# Patient Record
Sex: Female | Born: 1960 | Race: Black or African American | Hispanic: No | Marital: Single | Smoking: Never smoker
Health system: Southern US, Community
[De-identification: ages and names within clinical notes are randomized; demographics above are authoritative.]

## PROBLEM LIST (undated history)

## (undated) DIAGNOSIS — Z8041 Family history of malignant neoplasm of ovary: Secondary | ICD-10-CM

## (undated) DIAGNOSIS — Z9221 Personal history of antineoplastic chemotherapy: Secondary | ICD-10-CM

## (undated) DIAGNOSIS — Z853 Personal history of malignant neoplasm of breast: Secondary | ICD-10-CM

## (undated) DIAGNOSIS — Z1211 Encounter for screening for malignant neoplasm of colon: Secondary | ICD-10-CM

## (undated) DIAGNOSIS — K869 Disease of pancreas, unspecified: Secondary | ICD-10-CM

## (undated) DIAGNOSIS — H04123 Dry eye syndrome of bilateral lacrimal glands: Secondary | ICD-10-CM

## (undated) DIAGNOSIS — C50419 Malignant neoplasm of upper-outer quadrant of unspecified female breast: Secondary | ICD-10-CM

## (undated) DIAGNOSIS — C50919 Malignant neoplasm of unspecified site of unspecified female breast: Secondary | ICD-10-CM

## (undated) DIAGNOSIS — Z923 Personal history of irradiation: Secondary | ICD-10-CM

## (undated) DIAGNOSIS — Z1371 Encounter for nonprocreative screening for genetic disease carrier status: Secondary | ICD-10-CM

## (undated) DIAGNOSIS — I10 Essential (primary) hypertension: Secondary | ICD-10-CM

## (undated) DIAGNOSIS — T7840XA Allergy, unspecified, initial encounter: Secondary | ICD-10-CM

## (undated) DIAGNOSIS — D649 Anemia, unspecified: Secondary | ICD-10-CM

## (undated) DIAGNOSIS — G629 Polyneuropathy, unspecified: Secondary | ICD-10-CM

## (undated) DIAGNOSIS — Z1389 Encounter for screening for other disorder: Secondary | ICD-10-CM

## (undated) DIAGNOSIS — J45909 Unspecified asthma, uncomplicated: Secondary | ICD-10-CM

## (undated) DIAGNOSIS — N200 Calculus of kidney: Secondary | ICD-10-CM

## (undated) DIAGNOSIS — E669 Obesity, unspecified: Secondary | ICD-10-CM

## (undated) DIAGNOSIS — Z8042 Family history of malignant neoplasm of prostate: Secondary | ICD-10-CM

## (undated) DIAGNOSIS — N6009 Solitary cyst of unspecified breast: Secondary | ICD-10-CM

## (undated) DIAGNOSIS — L309 Dermatitis, unspecified: Secondary | ICD-10-CM

## (undated) DIAGNOSIS — J302 Other seasonal allergic rhinitis: Secondary | ICD-10-CM

## (undated) DIAGNOSIS — Z803 Family history of malignant neoplasm of breast: Secondary | ICD-10-CM

## (undated) DIAGNOSIS — N63 Unspecified lump in unspecified breast: Secondary | ICD-10-CM

## (undated) DIAGNOSIS — K5900 Constipation, unspecified: Secondary | ICD-10-CM

## (undated) DIAGNOSIS — R319 Hematuria, unspecified: Secondary | ICD-10-CM

## (undated) DIAGNOSIS — Z1239 Encounter for other screening for malignant neoplasm of breast: Secondary | ICD-10-CM

## (undated) DIAGNOSIS — K219 Gastro-esophageal reflux disease without esophagitis: Secondary | ICD-10-CM

## (undated) HISTORY — DX: Polyneuropathy, unspecified: G62.9

## (undated) HISTORY — DX: Unspecified lump in unspecified breast: N63.0

## (undated) HISTORY — DX: Calculus of kidney: N20.0

## (undated) HISTORY — DX: Malignant neoplasm of upper-outer quadrant of unspecified female breast: C50.419

## (undated) HISTORY — DX: Malignant neoplasm of unspecified site of unspecified female breast: C50.919

## (undated) HISTORY — DX: Encounter for screening for malignant neoplasm of colon: Z12.11

## (undated) HISTORY — DX: Constipation, unspecified: K59.00

## (undated) HISTORY — DX: Allergy, unspecified, initial encounter: T78.40XA

## (undated) HISTORY — DX: Solitary cyst of unspecified breast: N60.09

## (undated) HISTORY — DX: Family history of malignant neoplasm of breast: Z80.3

## (undated) HISTORY — DX: Family history of malignant neoplasm of ovary: Z80.41

## (undated) HISTORY — DX: Dermatitis, unspecified: L30.9

## (undated) HISTORY — DX: Encounter for other screening for malignant neoplasm of breast: Z12.39

## (undated) HISTORY — DX: Hematuria, unspecified: R31.9

## (undated) HISTORY — DX: Encounter for screening for other disorder: Z13.89

## (undated) HISTORY — PX: BREAST BIOPSY: SHX20

## (undated) HISTORY — DX: Family history of malignant neoplasm of prostate: Z80.42

## (undated) HISTORY — DX: Other seasonal allergic rhinitis: J30.2

## (undated) HISTORY — DX: Unspecified asthma, uncomplicated: J45.909

## (undated) HISTORY — DX: Disease of pancreas, unspecified: K86.9

## (undated) HISTORY — DX: Essential (primary) hypertension: I10

## (undated) HISTORY — DX: Encounter for nonprocreative screening for genetic disease carrier status: Z13.71

## (undated) HISTORY — DX: Dry eye syndrome of bilateral lacrimal glands: H04.123

## (undated) HISTORY — DX: Obesity, unspecified: E66.9

## (undated) HISTORY — DX: Personal history of malignant neoplasm of breast: Z85.3

## (undated) HISTORY — PX: TUBAL LIGATION: SHX77

---

## 1998-07-25 ENCOUNTER — Other Ambulatory Visit: Admission: RE | Admit: 1998-07-25 | Discharge: 1998-07-25 | Payer: Self-pay | Admitting: Obstetrics and Gynecology

## 1998-08-15 ENCOUNTER — Encounter: Payer: Self-pay | Admitting: Obstetrics and Gynecology

## 1998-08-15 ENCOUNTER — Ambulatory Visit (HOSPITAL_COMMUNITY): Admission: RE | Admit: 1998-08-15 | Discharge: 1998-08-15 | Payer: Self-pay | Admitting: Obstetrics and Gynecology

## 1998-08-27 ENCOUNTER — Ambulatory Visit (HOSPITAL_COMMUNITY): Admission: RE | Admit: 1998-08-27 | Discharge: 1998-08-27 | Payer: Self-pay | Admitting: Obstetrics and Gynecology

## 1998-08-27 ENCOUNTER — Encounter: Payer: Self-pay | Admitting: Obstetrics and Gynecology

## 1999-12-25 ENCOUNTER — Other Ambulatory Visit: Admission: RE | Admit: 1999-12-25 | Discharge: 1999-12-25 | Payer: Self-pay | Admitting: Obstetrics and Gynecology

## 2001-03-10 HISTORY — PX: KIDNEY STONE SURGERY: SHX686

## 2008-03-10 HISTORY — PX: BREAST CYST ASPIRATION: SHX578

## 2009-09-14 ENCOUNTER — Ambulatory Visit: Payer: Self-pay

## 2009-10-17 ENCOUNTER — Ambulatory Visit: Payer: Self-pay | Admitting: Gastroenterology

## 2009-10-17 HISTORY — PX: COLONOSCOPY: SHX174

## 2010-03-10 DIAGNOSIS — Z923 Personal history of irradiation: Secondary | ICD-10-CM

## 2010-03-10 DIAGNOSIS — C50919 Malignant neoplasm of unspecified site of unspecified female breast: Secondary | ICD-10-CM

## 2010-03-10 DIAGNOSIS — Z853 Personal history of malignant neoplasm of breast: Secondary | ICD-10-CM

## 2010-03-10 DIAGNOSIS — H04123 Dry eye syndrome of bilateral lacrimal glands: Secondary | ICD-10-CM

## 2010-03-10 HISTORY — PX: BREAST LUMPECTOMY: SHX2

## 2010-03-10 HISTORY — DX: Dry eye syndrome of bilateral lacrimal glands: H04.123

## 2010-03-10 HISTORY — PX: BREAST SURGERY: SHX581

## 2010-03-10 HISTORY — DX: Personal history of irradiation: Z92.3

## 2010-03-10 HISTORY — DX: Personal history of malignant neoplasm of breast: Z85.3

## 2010-03-10 HISTORY — DX: Malignant neoplasm of unspecified site of unspecified female breast: C50.919

## 2010-04-02 ENCOUNTER — Ambulatory Visit: Payer: Self-pay | Admitting: Family Medicine

## 2010-04-09 ENCOUNTER — Ambulatory Visit: Payer: Self-pay | Admitting: Family Medicine

## 2010-04-23 ENCOUNTER — Ambulatory Visit: Payer: Self-pay | Admitting: General Surgery

## 2010-05-08 ENCOUNTER — Ambulatory Visit: Payer: Self-pay | Admitting: General Surgery

## 2010-05-08 DIAGNOSIS — C50419 Malignant neoplasm of upper-outer quadrant of unspecified female breast: Secondary | ICD-10-CM

## 2010-05-08 HISTORY — DX: Malignant neoplasm of upper-outer quadrant of unspecified female breast: C50.419

## 2010-05-14 ENCOUNTER — Ambulatory Visit: Payer: Self-pay | Admitting: Radiation Oncology

## 2010-05-14 LAB — PATHOLOGY REPORT

## 2010-05-17 ENCOUNTER — Ambulatory Visit: Payer: Self-pay | Admitting: Radiation Oncology

## 2010-06-09 ENCOUNTER — Ambulatory Visit: Payer: Self-pay | Admitting: Radiation Oncology

## 2010-06-09 HISTORY — PX: BREAST MAMMOSITE: SHX5264

## 2010-07-01 ENCOUNTER — Ambulatory Visit: Payer: Self-pay | Admitting: Otolaryngology

## 2010-07-09 ENCOUNTER — Ambulatory Visit: Payer: Self-pay | Admitting: Radiation Oncology

## 2010-08-09 ENCOUNTER — Ambulatory Visit: Payer: Self-pay | Admitting: Radiation Oncology

## 2010-11-13 ENCOUNTER — Ambulatory Visit: Payer: Self-pay | Admitting: General Surgery

## 2010-12-11 ENCOUNTER — Ambulatory Visit: Payer: Self-pay | Admitting: Internal Medicine

## 2011-01-09 ENCOUNTER — Ambulatory Visit: Payer: Self-pay | Admitting: Internal Medicine

## 2011-06-10 ENCOUNTER — Ambulatory Visit: Payer: Self-pay | Admitting: General Surgery

## 2011-06-17 ENCOUNTER — Ambulatory Visit: Payer: Self-pay | Admitting: General Surgery

## 2011-07-16 ENCOUNTER — Emergency Department: Payer: Self-pay

## 2011-07-16 LAB — BASIC METABOLIC PANEL
Calcium, Total: 9 mg/dL (ref 8.5–10.1)
Chloride: 106 mmol/L (ref 98–107)
Creatinine: 0.95 mg/dL (ref 0.60–1.30)
EGFR (Non-African Amer.): >60
Sodium: 140 mmol/L (ref 136–145)

## 2011-07-16 LAB — CBC
MCH: 31.4 pg (ref 26.0–34.0)
MCHC: 33.6 g/dL (ref 32.0–36.0)
Platelet: 218 10*3/uL (ref 150–440)
RBC: 3.75 10*6/uL — ABNORMAL LOW (ref 3.80–5.20)
RDW: 14.2 % (ref 11.5–14.5)
WBC: 7 10*3/uL (ref 3.6–11.0)

## 2011-12-18 ENCOUNTER — Ambulatory Visit: Payer: Self-pay | Admitting: General Surgery

## 2011-12-29 ENCOUNTER — Ambulatory Visit: Payer: Self-pay | Admitting: Internal Medicine

## 2012-01-09 ENCOUNTER — Ambulatory Visit: Payer: Self-pay | Admitting: Internal Medicine

## 2012-05-08 ENCOUNTER — Encounter: Payer: Self-pay | Admitting: General Surgery

## 2012-06-21 ENCOUNTER — Ambulatory Visit: Payer: Self-pay | Admitting: General Surgery

## 2012-06-22 ENCOUNTER — Encounter: Payer: Self-pay | Admitting: General Surgery

## 2012-06-28 ENCOUNTER — Encounter: Payer: Self-pay | Admitting: *Deleted

## 2012-06-30 ENCOUNTER — Other Ambulatory Visit: Payer: Self-pay | Admitting: *Deleted

## 2012-06-30 ENCOUNTER — Ambulatory Visit (INDEPENDENT_AMBULATORY_CARE_PROVIDER_SITE_OTHER): Payer: 59 | Admitting: General Surgery

## 2012-06-30 ENCOUNTER — Encounter: Payer: Self-pay | Admitting: General Surgery

## 2012-06-30 VITALS — BP 124/76 | Ht 65.0 in | Wt 209.0 lb

## 2012-06-30 DIAGNOSIS — C50912 Malignant neoplasm of unspecified site of left female breast: Secondary | ICD-10-CM

## 2012-06-30 DIAGNOSIS — C50919 Malignant neoplasm of unspecified site of unspecified female breast: Secondary | ICD-10-CM

## 2012-06-30 DIAGNOSIS — Z853 Personal history of malignant neoplasm of breast: Secondary | ICD-10-CM

## 2012-06-30 NOTE — Progress Notes (Signed)
Patient ID: Adrienne [name], female   DOB: 08-26-1960, 52 y.o.   MRN: [id number]  Chief Complaint  Patient presents with  . Follow-up    mammogram  . Breast Cancer Long Term Follow Up    HPI Adrienne [name] is a 52 y.o. female who presents for a follow up mammogram. The most recent mammogram was done on 06/21/12 at [facility] with birad category 2. The patient denies any new problems with her breasts. She performs regular self breast checks and gets regular mammograms done. No complaints at this time.  HPI  Past Medical History  Diagnosis Date  . Seasonal allergies   . Unspecified essential hypertension   . Asthma   . 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 Dry eyes 2012  . Breast screening, unspecified   . Lump or mass in breast   . Special screening for malignant neoplasms, colon   . Obesity, unspecified   . Screening for obesity   . Breast cyst 2010    cyst- drained  . Constipation   . Malignant neoplasm of breast (female), unspecified site   . Malignant neoplasm of upper-outer quadrant of female breast     left breast cancer diagnosed in 2012     Past Surgical History  Procedure Laterality Date  . Cesarean section  1988  . Colonoscopy  2011    Dr. Niel Hummer  . Breast mammosite  April 2012    mammosite placement and removal   . Breast surgery Left 2012    left breast wide excision  . Kidney stone surgery  2003    stones removed by Dr. Reuel Boom- stent placed/removed  . Breast cyst aspiration  2010    Family History  Problem Relation Age of Onset  . Ovarian cancer Mother     Social History History  Substance Use Topics  . Smoking status: Never Smoker   . Smokeless tobacco: Not on file  . Alcohol Use: Yes     Comment: occasionally    Allergies  Allergen Reactions  . Shellfish  Allergy Other (See Comments)    Hives and swelling on ears and feet  . Tape Rash    Current Outpatient Prescriptions  Medication Sig Dispense Refill  . ALBUTEROL SULFATE HFA IN Inhale 2 puffs into the lungs as needed.      Marland Kitchen aspirin 81 MG tablet Take 81 mg by mouth daily.      . Cholecalciferol (VITAMIN D3) 1000 UNITS CAPS Take by mouth daily.      . fexofenadine (ALLEGRA) 180 MG tablet Take 180 mg by mouth daily.      . hydrochlorothiazide (HYDRODIURIL) 25 MG tablet Take 25 mg by mouth daily.      . Naproxen Sodium (ALEVE PO) Take by mouth.      Marland Kitchen oxiconazole (OXISTAT) 1 % lotion Apply 1 application topically as needed.      Marland Kitchen POTASSIUM PO Take 1,000 Units by mouth daily.      . tacrolimus (PROTOPIC) 0.1 % ointment Apply 1 application topically as needed.       No current facility-administered medications for this visit.    Review of Systems Review of Systems  Constitutional: Negative.   Respiratory: Negative.   Cardiovascular: Negative.     Blood pressure 124/76, height 5\' 5"  (1.651  m), weight 209 lb (94.802 kg).  Physical Exam Physical Exam  Constitutional: She appears well-developed and well-nourished.  Neck: Trachea normal. No mass and no thyromegaly present.  Cardiovascular: Normal rate, regular rhythm, normal heart sounds and normal pulses.   No murmur heard. Pulmonary/Chest: Effort normal and breath sounds normal. Right breast exhibits no inverted nipple, no mass, no nipple discharge, no skin change and no tenderness. Left breast exhibits no inverted nipple, no mass, no nipple discharge, no skin change and no tenderness. Breasts are symmetrical.  Tiny bit of thickening at the lateral edge of left breast.   Lymphadenopathy:    She has no cervical adenopathy.    She has no axillary adenopathy.   Excellent breast symmetry. Data Reviewed Bilateral mammograms completed 06/21/2012 were reviewed. Postsurgical changes identified. By red-2.  Assessment    Doing well  status post treatment of her left breast T1b, N0 carcinoma left breast.    Plan    Arrangements will be made for bilateral mammograms (diagnostic) in position exam in one year.       Earline Mayotte 07/01/2012, 7:56 PM

## 2012-06-30 NOTE — Patient Instructions (Addendum)
Patient to return in 1 year with bilateral diagnostic mammogram.  

## 2012-06-30 NOTE — Progress Notes (Signed)
The patient has been asked to return to the office in one year for a bilateral diagnostic mammogram. 

## 2012-07-01 ENCOUNTER — Encounter: Payer: Self-pay | Admitting: General Surgery

## 2012-12-29 ENCOUNTER — Ambulatory Visit: Payer: Self-pay | Admitting: Radiation Oncology

## 2013-01-08 ENCOUNTER — Ambulatory Visit: Payer: Self-pay | Admitting: Radiation Oncology

## 2013-04-25 ENCOUNTER — Ambulatory Visit: Payer: Self-pay | Admitting: Family Medicine

## 2013-06-22 ENCOUNTER — Ambulatory Visit: Payer: Self-pay | Admitting: General Surgery

## 2013-06-29 ENCOUNTER — Encounter: Payer: Self-pay | Admitting: General Surgery

## 2013-06-29 ENCOUNTER — Ambulatory Visit (INDEPENDENT_AMBULATORY_CARE_PROVIDER_SITE_OTHER): Payer: 59 | Admitting: General Surgery

## 2013-06-29 ENCOUNTER — Ambulatory Visit: Payer: 59 | Admitting: General Surgery

## 2013-06-29 VITALS — BP 130/76 | HR 78 | Resp 12 | Ht 65.0 in | Wt 202.0 lb

## 2013-06-29 DIAGNOSIS — Z853 Personal history of malignant neoplasm of breast: Secondary | ICD-10-CM

## 2013-06-29 NOTE — Patient Instructions (Signed)
Patient to return in one year bilateral diagnotic mammogram.  

## 2013-06-29 NOTE — Progress Notes (Signed)
Patient ID: Adrienne [name], female   DOB: 07/02/1960, 53 y.o.   MRN: [id number]  Chief Complaint  Patient presents with  . Follow-up    mammogram    HPI Adrienne [name] is a 53 y.o. female who presents for a breast evaluation. The most recent mammogram was done on 06/22/13. Patient does perform regular self breast checks and gets regular mammograms done.  No new problems at this time time.   HPI  Past Medical History  Diagnosis Date  . Seasonal allergies   . Unspecified essential hypertension   . Asthma   . 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 Dry eyes 2012  . Breast screening, unspecified   . Lump or mass in breast   . Special screening for malignant neoplasms, colon   . Obesity, unspecified   . Screening for obesity   . Breast cyst 2010    cyst- drained  . Constipation   . Malignant neoplasm of breast (female), unspecified site   . Malignant neoplasm of upper-outer quadrant of female breast     T1b, N0, M); triple negative, DCIS present.    Past Surgical History  Procedure Laterality Date  . Cesarean section  1988  . Colonoscopy  2011    Dr. Dionne Milo  . Breast mammosite  April 2012    mammosite placement and removal   . Kidney stone surgery  2003    stones removed by Dr. Quillian Quince- stent placed/removed  . Breast surgery Left 2012    left breast wide excision  . Breast cyst aspiration  2010    Family History  Problem Relation Age of Onset  . Ovarian cancer Mother     Social History History  Substance Use Topics  . Smoking status: Never Smoker   . Smokeless tobacco: Never Used  . Alcohol Use: Yes     Comment: occasionally    Allergies  Allergen Reactions  . Shellfish Allergy Other (See Comments)    Hives and swelling on ears and feet  . Tape Rash    Current  Outpatient Prescriptions  Medication Sig Dispense Refill  . ALBUTEROL SULFATE HFA IN Inhale 2 puffs into the lungs as needed.      Marland Kitchen aspirin 81 MG tablet Take 81 mg by mouth daily.      . Cholecalciferol (VITAMIN D3) 1000 UNITS CAPS Take by mouth daily.      . fexofenadine (ALLEGRA) 180 MG tablet Take 180 mg by mouth daily.      . hydrochlorothiazide (HYDRODIURIL) 25 MG tablet Take 25 mg by mouth daily.      . Naproxen Sodium (ALEVE PO) Take by mouth.      Marland Kitchen oxiconazole (OXISTAT) 1 % lotion Apply 1 application topically as needed.      Marland Kitchen POTASSIUM PO Take 1,000 Units by mouth daily.      . tacrolimus (PROTOPIC) 0.1 % ointment Apply 1 application topically as needed.       No current facility-administered medications for this visit.    Review of Systems Review of Systems  Constitutional: Negative.   Respiratory: Negative.   Cardiovascular: Negative.     Blood pressure 130/76, pulse 78, resp. rate 12, height 5\' 5"  (1.651 m), weight 202 lb (91.627 kg).  Physical Exam Physical Exam  Constitutional: She is  oriented to person, place, and time. She appears well-developed and well-nourished.  Eyes: Conjunctivae are normal.  Neck: Neck supple.  Cardiovascular: Normal rate, regular rhythm and normal heart sounds.   Pulmonary/Chest: Effort normal and breath sounds normal. Right breast exhibits no inverted nipple, no mass, no nipple discharge, no skin change and no tenderness. Left breast exhibits no inverted nipple, no mass, no nipple discharge, no skin change and no tenderness.  Thickening at the mid section of the scar 3 cm left breast.  Abdominal: Soft. Bowel sounds are normal. There is no tenderness.  Lymphadenopathy:    She has no cervical adenopathy.    She has no axillary adenopathy.  Neurological: She is alert and oriented to person, place, and time.  Skin: Skin is warm and dry.    Data Reviewed Bilateral mammograms dated June 22, 2013 were reviewed and compared to previous  studies. No interval change.BI-RAD 2.  Assessment    Stable breast exam.     Plan    Followup examination in one year with mammograms has been recommended to the patient.     PCP: Seth Bake Jaymond Waage 06/30/2013, 4:13 PM

## 2013-06-30 ENCOUNTER — Encounter: Payer: Self-pay | Admitting: General Surgery

## 2014-01-09 ENCOUNTER — Encounter: Payer: Self-pay | Admitting: General Surgery

## 2014-03-10 DIAGNOSIS — Z9221 Personal history of antineoplastic chemotherapy: Secondary | ICD-10-CM

## 2014-03-10 HISTORY — PX: BREAST LUMPECTOMY: SHX2

## 2014-03-10 HISTORY — DX: Personal history of antineoplastic chemotherapy: Z92.21

## 2014-06-07 ENCOUNTER — Ambulatory Visit
Admit: 2014-06-07 | Disposition: A | Discharge status: Home / Self Care | Payer: Self-pay | Attending: Obstetrics and Gynecology | Admitting: Obstetrics and Gynecology

## 2014-06-27 ENCOUNTER — Encounter: Payer: Self-pay | Admitting: General Surgery

## 2014-06-27 ENCOUNTER — Ambulatory Visit
Admit: 2014-06-27 | Disposition: A | Discharge status: Home / Self Care | Payer: Self-pay | Attending: General Surgery | Admitting: General Surgery

## 2014-06-29 ENCOUNTER — Ambulatory Visit
Admit: 2014-06-29 | Disposition: A | Discharge status: Home / Self Care | Payer: Self-pay | Admitting: Obstetrics and Gynecology

## 2014-07-04 ENCOUNTER — Encounter: Payer: Self-pay | Admitting: General Surgery

## 2014-07-05 ENCOUNTER — Other Ambulatory Visit: Payer: 59

## 2014-07-05 ENCOUNTER — Encounter: Payer: Self-pay | Admitting: General Surgery

## 2014-07-05 ENCOUNTER — Ambulatory Visit (INDEPENDENT_AMBULATORY_CARE_PROVIDER_SITE_OTHER): Payer: 59 | Admitting: General Surgery

## 2014-07-05 VITALS — BP 148/78 | HR 82 | Resp 13 | Ht 65.0 in | Wt 209.0 lb

## 2014-07-05 DIAGNOSIS — N6310 Unspecified lump in the right breast, unspecified quadrant: Secondary | ICD-10-CM

## 2014-07-05 DIAGNOSIS — Z853 Personal history of malignant neoplasm of breast: Secondary | ICD-10-CM | POA: Diagnosis not present

## 2014-07-05 DIAGNOSIS — R928 Other abnormal and inconclusive findings on diagnostic imaging of breast: Secondary | ICD-10-CM

## 2014-07-05 NOTE — Progress Notes (Signed)
Patient ID: Adrienne [name], female   DOB: 02/04/1961, 54 y.o.   MRN: [id number]  Chief Complaint  Patient presents with  . Follow-up    mammogram    HPI Adrienne [name] is a 54 y.o. female who presents for a breast evaluation. The most recent mammogram and right ultrasound was done on 06/27/14 .  Patient does perform regular self breast checks and gets regular mammograms done.  Patient had a ct scan done on 06/29/14 and states she has a spot on her pancreas.  HPI  Past Medical History  Diagnosis Date  . Seasonal allergies   . Unspecified essential hypertension   . Asthma   . 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 Dry eyes 2012  . Breast screening, unspecified   . Lump or mass in breast   . Special screening for malignant neoplasms, colon   . Obesity, unspecified   . Screening for obesity   . Breast cyst 2010    cyst- drained  . Constipation   . Malignant neoplasm of breast (female), unspecified site   . Malignant neoplasm of upper-outer quadrant of female breast     T1b, N0, M); triple negative, DCIS present.  . Kidney stone     Past Surgical History  Procedure Laterality Date  . Cesarean section  1988  . Colonoscopy  2011    Dr. Dionne Milo  . Breast mammosite  April 2012    mammosite placement and removal   . Kidney stone surgery  2003    stones removed by Dr. Quillian Quince- stent placed/removed  . Breast surgery Left 2012    left breast wide excision  . Breast cyst aspiration  2010    Family History  Problem Relation Age of Onset  . Ovarian cancer Mother     Social History History  Substance Use Topics  . Smoking status: Never Smoker   . Smokeless tobacco: Never Used  . Alcohol Use: Yes     Comment: occasionally    Allergies  Allergen Reactions  . Shellfish Allergy Other  (See Comments)    Hives and swelling on ears and feet  . Tape Rash    Current Outpatient Prescriptions  Medication Sig Dispense Refill  . ALBUTEROL SULFATE HFA IN Inhale 2 puffs into the lungs as needed.    Marland Kitchen aspirin 81 MG tablet Take 81 mg by mouth daily.    Marland Kitchen b complex vitamins tablet Take 1 tablet by mouth daily.    . Cholecalciferol (VITAMIN D3) 1000 UNITS CAPS Take by mouth daily.    . fexofenadine (ALLEGRA) 180 MG tablet Take 180 mg by mouth daily.    . hydrochlorothiazide (HYDRODIURIL) 25 MG tablet Take 12.5 mg by mouth daily.     Marland Kitchen POTASSIUM PO Take 1,000 Units by mouth daily.     No current facility-administered medications for this visit.    Review of Systems Review of Systems  Constitutional: Negative.   Respiratory: Negative.   Cardiovascular: Negative.     Blood pressure 148/78, pulse 82, resp. rate 13, height 5\' 5"  (1.651 m), weight 209 lb (94.802 kg).  Physical Exam Physical Exam  Constitutional: She is oriented to person, place, and time. She appears well-developed and well-nourished.  Eyes: Conjunctivae are normal. No scleral icterus.  Cardiovascular: Normal rate, regular rhythm  and normal heart sounds.   Pulmonary/Chest: Effort normal and breath sounds normal. Right breast exhibits no inverted nipple, no mass, no nipple discharge, no skin change and no tenderness. Left breast exhibits no inverted nipple, no mass, no nipple discharge, no skin change and no tenderness.  Well healed incision in the upper outer quadrant and 4 cm thickening below the scar left breast.  Lymphadenopathy:    She has no cervical adenopathy.    She has no axillary adenopathy.  Neurological: She is alert and oriented to person, place, and time.  Skin: Skin is warm and dry.    Data Reviewed Bilateral diagnostic mammograms and right breast ultrasound dated 06/27/2014 was reviewed. The left breast was unremarkable. The right breast just an 8 mm mass in the upper-outer quadrant.  Ultrasound reported a 6 x 5 x 6 mm area 9 cm from nipple 11:00 position. I read-4.  Ultrasound examination of the right breast was completed in both the supine and left lateral decubitus position. A corresponding abnormality could not be appreciated. (No images, no charge).  Assessment    Possible abnormal right breast mammogram/ultrasound.    Plan    The inability to recognize the area described by the radiologist was reviewed with the patient. It's reasonable to have attempted biopsy completed by the radiology service. The patient is aware that the pathology report will be available likely 2 days post biopsy.  Patient to have a right breast ultrasound guided biopsy at the [facility] on Friday, 07-07-14 at 8:15 am.       PCP:  Kennieth Francois 07/05/2014, 8:22 PM

## 2014-07-05 NOTE — Patient Instructions (Addendum)
Follow up appointment to be announced.  Patient to have a right breast ultrasound guided biopsy at the [facility] on Friday, 07-07-14 at 8:15 am.

## 2014-07-07 ENCOUNTER — Other Ambulatory Visit: Payer: Self-pay

## 2014-07-07 ENCOUNTER — Ambulatory Visit
Admit: 2014-07-07 | Disposition: A | Discharge status: Home / Self Care | Payer: Self-pay | Attending: General Surgery | Admitting: General Surgery

## 2014-07-08 DIAGNOSIS — C50919 Malignant neoplasm of unspecified site of unspecified female breast: Secondary | ICD-10-CM

## 2014-07-08 HISTORY — DX: Malignant neoplasm of unspecified site of unspecified female breast: C50.919

## 2014-07-11 ENCOUNTER — Telehealth: Payer: Self-pay | Admitting: General Surgery

## 2014-07-11 LAB — SURGICAL PATHOLOGY

## 2014-07-11 NOTE — Telephone Encounter (Signed)
Patient notified results of recently complete right breast biopsy showed invasive cancer. We will get together on Thursday AM at 8: 15 to review options.

## 2014-07-13 ENCOUNTER — Other Ambulatory Visit: Payer: Self-pay | Admitting: General Surgery

## 2014-07-13 ENCOUNTER — Encounter: Payer: Self-pay | Admitting: General Surgery

## 2014-07-13 ENCOUNTER — Ambulatory Visit: Payer: 59

## 2014-07-13 ENCOUNTER — Ambulatory Visit (INDEPENDENT_AMBULATORY_CARE_PROVIDER_SITE_OTHER): Payer: 59 | Admitting: General Surgery

## 2014-07-13 VITALS — BP 138/90 | HR 60 | Resp 12 | Ht 65.0 in | Wt 204.0 lb

## 2014-07-13 DIAGNOSIS — C50911 Malignant neoplasm of unspecified site of right female breast: Secondary | ICD-10-CM

## 2014-07-13 NOTE — Progress Notes (Signed)
Patient ID: Adrienne [name], female   DOB: Nov 21, 1960, 54 y.o.   MRN: [id number]  Chief Complaint  Patient presents with  . Follow-up    HPI Adrienne [name] is a 54 y.o. female.  Here today for breast discussion.  HPI  Past Medical History  Diagnosis Date  . Seasonal allergies   . Unspecified essential hypertension   . Asthma   . 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 Dry eyes 2012  . Breast screening, unspecified   . Lump or mass in breast   . Special screening for malignant neoplasms, colon   . Obesity, unspecified   . Screening for obesity   . Breast cyst 2010    cyst- drained  . Constipation   . Kidney stone   . Malignant neoplasm of breast (female), unspecified site 07/08/2014    Right breast, triple negative  . Malignant neoplasm of upper-outer quadrant of female breast 05/08/2010    Left breast: T1b, N0, M); triple negative, DCIS present.    Past Surgical History  Procedure Laterality Date  . Cesarean section  1988  . Colonoscopy  2011    Dr. Dionne Milo  . Breast mammosite  April 2012    mammosite placement and removal   . Kidney stone surgery  2003    stones removed by Dr. Quillian Quince- stent placed/removed  . Breast surgery Left 2012    left breast wide excision  . Breast cyst aspiration  2010    Family History  Problem Relation Age of Onset  . Ovarian cancer Mother     Social History History  Substance Use Topics  . Smoking status: Never Smoker   . Smokeless tobacco: Never Used  . Alcohol Use: Yes     Comment: occasionally    Allergies  Allergen Reactions  . Shellfish Allergy Other (See Comments)    Hives and swelling on ears and feet  . Tape Rash    Current Outpatient Prescriptions  Medication Sig Dispense Refill  . ALBUTEROL SULFATE HFA IN Inhale 2 puffs  into the lungs as needed.    Marland Kitchen aspirin 81 MG tablet Take 81 mg by mouth daily.    Marland Kitchen b complex vitamins tablet Take 1 tablet by mouth daily.    . Cholecalciferol (VITAMIN D3) 1000 UNITS CAPS Take by mouth daily.    . fexofenadine (ALLEGRA) 180 MG tablet Take 180 mg by mouth daily.    . hydrochlorothiazide (HYDRODIURIL) 25 MG tablet Take 12.5 mg by mouth daily.     Marland Kitchen POTASSIUM PO Take 1,000 Units by mouth daily.     No current facility-administered medications for this visit.    Review of Systems Review of Systems  Constitutional: Negative.   Respiratory: Negative.   Cardiovascular: Negative.     Blood pressure 138/90, pulse 60, resp. rate 12, height 5\' 5"  (1.651 m), weight 204 lb (92.534 kg).  Physical Exam Physical Exam  Constitutional: She is oriented to person, place, and time. She appears well-developed and well-nourished.  Pulmonary/Chest:  Right breast biopsy site with small bruise.  Neurological: She is alert and oriented to person, place, and time.  Skin: Skin is warm and dry.    Data Reviewed Ultrasound guided biopsy completed by the radiology department showed invasive mammary carcinoma, histologic grade 3, triple negative.  Ultrasound examination of the right breast in the 12:00 position 8 cm from the nipple showed the previous biopsy cavity with the marking clip in place measuring up to 0.5 cm in diameter.  Assessment    Stage I carcinoma the right breast.    Plan    Options for management were reviewed with the patient and her husband. She had previously chosen breast conservation, and considering the small tumor volume this is again her choice. The indication for sentinel node biopsy was reviewed.  Patient's surgery has been scheduled for 08-01-14 at [facility]. It is okay for patient to continue 81 mg aspirin once daily.       PCP:  Dia Sitter NP   Robert Bellow 07/14/2014, 12:56 PM

## 2014-07-13 NOTE — Patient Instructions (Addendum)
The patient is aware to call back for any questions or concerns.  Patient's surgery has been scheduled for 08-01-14 at [facility]. It is okay for patient to continue 81 mg aspirin once daily.

## 2014-07-14 ENCOUNTER — Encounter: Payer: Self-pay | Admitting: General Surgery

## 2014-07-14 ENCOUNTER — Other Ambulatory Visit: Payer: Self-pay | Admitting: General Surgery

## 2014-07-14 NOTE — H&P (Signed)
HPI  Adrienne [name] is a 54 y.o. female. Here today for breast discussion.  HPI  Past Medical History   Diagnosis  Date   .  Seasonal allergies    .  Unspecified essential hypertension    .  Asthma    .  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  Dry eyes  2012   .  Breast screening, unspecified    .  Lump or mass in breast    .  Special screening for malignant neoplasms, colon    .  Obesity, unspecified    .  Screening for obesity    .  Breast cyst  2010     cyst- drained   .  Constipation    .  Kidney stone    .  Malignant neoplasm of breast (female), unspecified site  07/08/2014     Right breast, triple negative   .  Malignant neoplasm of upper-outer quadrant of female breast  05/08/2010     Left breast: T1b, N0, M); triple negative, DCIS present.    Past Surgical History   Procedure  Laterality  Date   .  Cesarean section   1988   .  Colonoscopy   2011     Dr. Dionne Milo   .  Breast mammosite   April 2012     mammosite placement and removal   .  Kidney stone surgery   2003     stones removed by Dr. Quillian Quince- stent placed/removed   .  Breast surgery  Left  2012     left breast wide excision   .  Breast cyst aspiration   2010    Family History   Problem  Relation  Age of Onset   .  Ovarian cancer  Mother     Social History  History   Substance Use Topics   .  Smoking status:  Never Smoker   .  Smokeless tobacco:  Never Used   .  Alcohol Use:  Yes      Comment: occasionally    Allergies   Allergen  Reactions   .  Shellfish Allergy  Other (See Comments)     Hives and swelling on ears and feet   .  Tape  Rash    Current Outpatient Prescriptions   Medication  Sig  Dispense  Refill   .  ALBUTEROL SULFATE HFA IN  Inhale 2 puffs into the lungs as needed.     Marland Kitchen  aspirin 81 MG  tablet  Take 81 mg by mouth daily.     Marland Kitchen  b complex vitamins tablet  Take 1 tablet by mouth daily.     .  Cholecalciferol (VITAMIN D3) 1000 UNITS CAPS  Take by mouth daily.     .  fexofenadine (ALLEGRA) 180 MG tablet  Take 180 mg by mouth daily.     .  hydrochlorothiazide (HYDRODIURIL) 25 MG tablet  Take 12.5 mg by mouth daily.     Marland Kitchen  POTASSIUM PO  Take 1,000 Units by mouth daily.      No current facility-administered medications for this visit.    Review of Systems  Review of Systems  Constitutional: Negative.  Respiratory: Negative.  Cardiovascular: Negative.   Blood pressure 138/90, pulse 60,  resp. rate 12, height 5\' 5"  (1.651 m), weight 204 lb (92.534 kg).  Physical Exam  Physical Exam  Constitutional: She is oriented to person, place, and time. She appears well-developed and well-nourished.  Pulmonary/Chest:  Right breast biopsy site with small bruise.  Neurological: She is alert and oriented to person, place, and time.  Skin: Skin is warm and dry.   Data Reviewed  Ultrasound guided biopsy completed by the radiology department showed invasive mammary carcinoma, histologic grade 3, triple negative.  Ultrasound examination of the right breast in the 12:00 position 8 cm from the nipple showed the previous biopsy cavity with the marking clip in place measuring up to 0.5 cm in diameter.  Assessment   Stage I carcinoma the right breast.   Plan   Options for management were reviewed with the patient and her husband. She had previously chosen breast conservation, and considering the small tumor volume this is again her choice. The indication for sentinel node biopsy was reviewed.  Patient's surgery has been scheduled for 08-01-14 at [facility]. It is okay for patient to continue 81 mg aspirin once daily.

## 2014-07-17 ENCOUNTER — Other Ambulatory Visit: Payer: Self-pay | Admitting: General Surgery

## 2014-07-17 DIAGNOSIS — C50411 Malignant neoplasm of upper-outer quadrant of right female breast: Secondary | ICD-10-CM

## 2014-07-25 ENCOUNTER — Other Ambulatory Visit: Payer: Self-pay

## 2014-07-26 ENCOUNTER — Inpatient Hospital Stay: Admission: RE | Admit: 2014-07-26 | Payer: Self-pay | Source: Ambulatory Visit

## 2014-07-26 ENCOUNTER — Encounter: Payer: Self-pay | Admitting: *Deleted

## 2014-07-26 DIAGNOSIS — C50911 Malignant neoplasm of unspecified site of right female breast: Secondary | ICD-10-CM | POA: Diagnosis present

## 2014-07-26 NOTE — Patient Instructions (Signed)
  Your procedure is scheduled on: 08-01-14 Report to [facility]k-2nd desk on right To find out your arrival time please call [phone] between 1PM - 3PM on 07-31-14  Remember: Instructions that are not followed completely may result in serious medical risk, up to and including death, or upon the discretion of your surgeon and anesthesiologist your surgery may need to be rescheduled.    _x___ 1. Do not eat food or drink liquids after midnight. No gum chewing or hard candies.     _x___ 2. No Alcohol for 24 hours before or after surgery.   ____ 3. Bring all medications with you on the day of surgery if instructed.    _x___ 4. Notify your doctor if there is any change in your medical condition     (cold, fever, infections).     Do not wear jewelry, make-up, hairpins, clips or nail polish.  Do not wear lotions, powders, or perfumes. You may wear deodorant.  Do not shave 48 hours prior to surgery. Men may shave face and neck.  Do not bring valuables to the hospital.    [facility] is not responsible for any belongings or valuables.               Contacts, dentures or bridgework may not be worn into surgery.  Leave your suitcase in the car. After surgery it may be brought to your room.  For patients admitted to the hospital, discharge time is determined by your                treatment team.   Patients discharged the day of surgery will not be allowed to drive home.   Please read over the following fact sheets that you were given:            CHG INSTRUCTIONS   __x__ Take these medicines the morning of surgery with A SIP OF WATER:    1.Nexium  2.  3.   4.  5.  6.  ____ Fleet Enema (as directed)   _x___ Use CHG Soap as directed  _x___ Use inhalers on the day of surgery-USE ALBUTEROL Lebam  ____ Stop metformin 2 days prior to surgery    ____ Take 1/2 of usual insulin dose the night before surgery and none on the morning of  surgery.   ____ Stop Coumadin/Plavix/aspirin-OK TO CONTINUE 81 mg Aspirin per Dr Bary Castilla - (Tylenol ok to take if needed)  ____ Stop Anti-inflammatories   _x___ Stop supplements until after surgery. STOP B COMPLEX NOW (07-26-14)   ____ Bring C-Pap to the hospital.

## 2014-07-31 ENCOUNTER — Encounter
Admission: RE | Admit: 2014-07-31 | Discharge: 2014-07-31 | Disposition: A | Discharge status: Home / Self Care | Payer: 59 | Source: Ambulatory Visit | Attending: General Surgery | Admitting: General Surgery

## 2014-07-31 DIAGNOSIS — I10 Essential (primary) hypertension: Secondary | ICD-10-CM | POA: Diagnosis not present

## 2014-07-31 DIAGNOSIS — C50911 Malignant neoplasm of unspecified site of right female breast: Secondary | ICD-10-CM | POA: Diagnosis not present

## 2014-07-31 LAB — POTASSIUM: Potassium: 3.7 mmol/L (ref 3.5–5.1)

## 2014-07-31 LAB — SURGICAL PATHOLOGY

## 2014-08-01 ENCOUNTER — Encounter
Admission: RE | Disposition: A | Discharge status: Home / Self Care | Payer: Self-pay | Source: Ambulatory Visit | Attending: General Surgery

## 2014-08-01 ENCOUNTER — Ambulatory Visit
Admission: RE | Admit: 2014-08-01 | Discharge: 2014-08-01 | Disposition: A | Discharge status: Home / Self Care | Payer: 59 | Source: Ambulatory Visit | Attending: General Surgery | Admitting: General Surgery

## 2014-08-01 ENCOUNTER — Ambulatory Visit: Payer: 59 | Admitting: Anesthesiology

## 2014-08-01 ENCOUNTER — Telehealth: Payer: Self-pay | Admitting: General Surgery

## 2014-08-01 ENCOUNTER — Encounter: Payer: Self-pay | Admitting: *Deleted

## 2014-08-01 ENCOUNTER — Ambulatory Visit: Payer: 59

## 2014-08-01 DIAGNOSIS — C50911 Malignant neoplasm of unspecified site of right female breast: Secondary | ICD-10-CM | POA: Diagnosis not present

## 2014-08-01 DIAGNOSIS — N6310 Unspecified lump in the right breast, unspecified quadrant: Secondary | ICD-10-CM

## 2014-08-01 DIAGNOSIS — C50411 Malignant neoplasm of upper-outer quadrant of right female breast: Secondary | ICD-10-CM | POA: Diagnosis not present

## 2014-08-01 HISTORY — DX: Gastro-esophageal reflux disease without esophagitis: K21.9

## 2014-08-01 HISTORY — DX: Anemia, unspecified: D64.9

## 2014-08-01 HISTORY — PX: BREAST LUMPECTOMY WITH AXILLARY LYMPH NODE BIOPSY: SHX5593

## 2014-08-01 HISTORY — PX: SENTINEL NODE BIOPSY: SHX6608

## 2014-08-01 SURGERY — BREAST LUMPECTOMY WITH AXILLARY LYMPH NODE BIOPSY
Anesthesia: General | Site: Breast | Laterality: Right | Wound class: Clean

## 2014-08-01 MED ORDER — ONDANSETRON HCL 4 MG/2ML IJ SOLN
4.0000 mg | Freq: Once | INTRAMUSCULAR | Status: AC | PRN
  Administered 2014-08-01: 4 mg via INTRAVENOUS

## 2014-08-01 MED ORDER — HYDROCODONE-ACETAMINOPHEN 5-325 MG PO TABS: 1.0000 | ORAL_TABLET | Freq: Four times a day (QID) | ORAL | Status: DC | PRN

## 2014-08-01 MED ORDER — LACTATED RINGERS IV SOLN
INTRAVENOUS | Status: DC
  Administered 2014-08-01 (×2): via INTRAVENOUS

## 2014-08-01 MED ORDER — ACETAMINOPHEN 10 MG/ML IV SOLN
INTRAVENOUS | Status: AC
  Filled 2014-08-01: qty 100

## 2014-08-01 MED ORDER — HYDROMORPHONE HCL 1 MG/ML IJ SOLN
INTRAMUSCULAR | Status: AC
  Filled 2014-08-01: qty 1

## 2014-08-01 MED ORDER — METHYLENE BLUE 1 % INJ SOLN
INTRAMUSCULAR | Status: DC | PRN
  Administered 2014-08-01: 1.5 mL via INTRADERMAL

## 2014-08-01 MED ORDER — BUPIVACAINE-EPINEPHRINE (PF) 0.5% -1:200000 IJ SOLN
INTRAMUSCULAR | Status: AC
  Filled 2014-08-01: qty 30

## 2014-08-01 MED ORDER — BUPIVACAINE-EPINEPHRINE 0.5% -1:200000 IJ SOLN
INTRAMUSCULAR | Status: DC | PRN
Start: 2014-08-01 — End: 2014-08-01
  Administered 2014-08-01: 30 mL

## 2014-08-01 MED ORDER — BUPIVACAINE HCL (PF) 0.5 % IJ SOLN
INTRAMUSCULAR | Status: AC
  Filled 2014-08-01: qty 30

## 2014-08-01 MED ORDER — METHYLENE BLUE 1 % INJ SOLN
INTRAMUSCULAR | Status: AC
  Filled 2014-08-01: qty 10

## 2014-08-01 MED ORDER — HYDROCODONE-ACETAMINOPHEN 5-325 MG PO TABS: 1.0000 | ORAL_TABLET | ORAL | Status: DC | PRN

## 2014-08-01 MED ORDER — LIDOCAINE HCL (CARDIAC) 20 MG/ML IV SOLN
INTRAVENOUS | Status: DC | PRN
  Administered 2014-08-01: 40 mg via INTRAVENOUS

## 2014-08-01 MED ORDER — TECHNETIUM TC 99M SULFUR COLLOID
1.0000 | Freq: Once | INTRAVENOUS | Status: AC | PRN
  Administered 2014-08-01: 1.039 via INTRAVENOUS

## 2014-08-01 MED ORDER — KETOROLAC TROMETHAMINE 30 MG/ML IJ SOLN
INTRAMUSCULAR | Status: DC | PRN
  Administered 2014-08-01: 30 mg via INTRAVENOUS

## 2014-08-01 MED ORDER — ACETAMINOPHEN 10 MG/ML IV SOLN
INTRAVENOUS | Status: DC | PRN
  Administered 2014-08-01: 1000 mg via INTRAVENOUS

## 2014-08-01 MED ORDER — SODIUM CHLORIDE 0.9 % IJ SOLN
INTRAMUSCULAR | Status: AC
  Filled 2014-08-01: qty 10

## 2014-08-01 MED ORDER — FENTANYL CITRATE (PF) 100 MCG/2ML IJ SOLN
INTRAMUSCULAR | Status: DC
Start: 2014-08-01 — End: 2014-08-01
  Filled 2014-08-01: qty 4

## 2014-08-01 MED ORDER — FENTANYL CITRATE (PF) 100 MCG/2ML IJ SOLN
INTRAMUSCULAR | Status: DC | PRN
  Administered 2014-08-01 (×2): 50 ug via INTRAVENOUS

## 2014-08-01 MED ORDER — FENTANYL CITRATE (PF) 100 MCG/2ML IJ SOLN
25.0000 ug | INTRAMUSCULAR | Status: DC | PRN
  Administered 2014-08-01 (×5): 25 ug via INTRAVENOUS

## 2014-08-01 MED ORDER — MIDAZOLAM HCL 2 MG/2ML IJ SOLN
INTRAMUSCULAR | Status: DC | PRN
  Administered 2014-08-01: 2 mg via INTRAVENOUS

## 2014-08-01 MED ORDER — ONDANSETRON HCL 4 MG/2ML IJ SOLN
INTRAMUSCULAR | Status: AC
  Administered 2014-08-01: 4 mg via INTRAVENOUS
  Filled 2014-08-01: qty 2

## 2014-08-01 MED ORDER — HYDROMORPHONE HCL 1 MG/ML IJ SOLN
0.5000 mg | INTRAMUSCULAR | Status: DC | PRN
  Administered 2014-08-01: 0.5 mg via INTRAVENOUS

## 2014-08-01 MED ORDER — ROCURONIUM BROMIDE 100 MG/10ML IV SOLN
INTRAVENOUS | Status: DC | PRN
  Administered 2014-08-01: 10 mg via INTRAVENOUS

## 2014-08-01 MED ORDER — PROPOFOL 10 MG/ML IV BOLUS
INTRAVENOUS | Status: DC | PRN
  Administered 2014-08-01: 200 mg via INTRAVENOUS

## 2014-08-01 SURGICAL SUPPLY — 50 items
APPLIER CLIP 11 MED OPEN (CLIP)
APR CLP MED 11 20 MLT OPN (CLIP)
BANDAGE ELASTIC 6 CLIP NS LF (GAUZE/BANDAGES/DRESSINGS) ×2 IMPLANT
BLADE SURG 15 STRL SS SAFETY (BLADE) ×2 IMPLANT
BNDG GAUZE 4.5X4.1 6PLY STRL (MISCELLANEOUS) ×2 IMPLANT
BULB RESERV EVAC DRAIN JP 100C (MISCELLANEOUS) IMPLANT
CANISTER SUCT 1200ML W/VALVE (MISCELLANEOUS) ×2 IMPLANT
CHLORAPREP W/TINT 26ML (MISCELLANEOUS) ×2 IMPLANT
CLIP APPLIE 11 MED OPEN (CLIP) IMPLANT
CNTNR SPEC 2.5X3XGRAD LEK (MISCELLANEOUS) ×3
CONT SPEC 4OZ STER OR WHT (MISCELLANEOUS) ×3
CONT SPEC 4OZ STRL OR WHT (MISCELLANEOUS) ×3
CONTAINER SPEC 2.5X3XGRAD LEK (MISCELLANEOUS) ×3 IMPLANT
COVER PROBE FLX POLY STRL (MISCELLANEOUS) ×2 IMPLANT
DEVICE DUBIN SPECIMEN MAMMOGRA (MISCELLANEOUS) ×2 IMPLANT
DRAIN CHANNEL JP 15F RND 16 (MISCELLANEOUS) IMPLANT
DRAPE LAPAROTOMY TRNSV 106X77 (MISCELLANEOUS) ×2 IMPLANT
DRSG TELFA 3X8 NADH (GAUZE/BANDAGES/DRESSINGS) ×2 IMPLANT
ELECT CAUTERY BLADE TIP 2.5 (TIP) ×2
ELECTRODE CAUTERY BLDE TIP 2.5 (TIP) ×1 IMPLANT
GAUZE FLUFF 18X24 1PLY STRL (GAUZE/BANDAGES/DRESSINGS) ×2 IMPLANT
GLOVE BIO SURGEON STRL SZ7.5 (GLOVE) ×4 IMPLANT
GLOVE INDICATOR 8.0 STRL GRN (GLOVE) ×4 IMPLANT
GOWN STRL REUS W/ TWL LRG LVL3 (GOWN DISPOSABLE) ×2 IMPLANT
GOWN STRL REUS W/TWL LRG LVL3 (GOWN DISPOSABLE) ×4
HARMONIC SCALPEL FOCUS (MISCELLANEOUS) IMPLANT
KIT RM TURNOVER STRD PROC AR (KITS) ×2 IMPLANT
LABEL OR SOLS (LABEL) ×2 IMPLANT
MARGIN MAP 10MM (MISCELLANEOUS) ×2 IMPLANT
NDL SAFETY 18GX1.5 (NEEDLE) ×2 IMPLANT
NDL SAFETY 22GX1.5 (NEEDLE) ×2 IMPLANT
NDL SAFETY 25GX1.5 (NEEDLE) ×2 IMPLANT
NS IRRIG 500ML POUR BTL (IV SOLUTION) IMPLANT
PACK BASIN MINOR ARMC (MISCELLANEOUS) ×2 IMPLANT
PAD GROUND ADULT SPLIT (MISCELLANEOUS) ×2 IMPLANT
PIN SAFETY STRL (MISCELLANEOUS) IMPLANT
SLEVE PROBE SENORX GAMMA FIND (MISCELLANEOUS) ×2 IMPLANT
STRIP CLOSURE SKIN 1/2X4 (GAUZE/BANDAGES/DRESSINGS) ×2 IMPLANT
SUT ETH BLK MONO 3 0 FS 1 12/B (SUTURE) IMPLANT
SUT ETHILON 2 0 FS 18 (SUTURE) ×2 IMPLANT
SUT SILK 2 0 (SUTURE) ×2
SUT SILK 2-0 18XBRD TIE 12 (SUTURE) ×1 IMPLANT
SUT SILK 3 0 (SUTURE)
SUT SILK 3-0 18XBRD TIE 12 (SUTURE) IMPLANT
SUT VIC AB 2-0 CT1 (SUTURE) ×4 IMPLANT
SUT VIC AB 4-0 FS2 27 (SUTURE) ×6 IMPLANT
SWABSTK COMLB BENZOIN TINCTURE (MISCELLANEOUS) ×2 IMPLANT
SYR CONTROL 10ML (SYRINGE) ×2 IMPLANT
SYRINGE 10CC LL (SYRINGE) ×4 IMPLANT
TAPE TRANSPORE STRL 2 31045 (GAUZE/BANDAGES/DRESSINGS) ×2 IMPLANT

## 2014-08-01 NOTE — Anesthesia Procedure Notes (Signed)
Procedure Name: LMA Insertion Performed by: Courtney Paris Pre-anesthesia Checklist: Patient identified, Emergency Drugs available, Suction available and Patient being monitored Patient Re-evaluated:Patient Re-evaluated prior to inductionOxygen Delivery Method: Circle system utilized Preoxygenation: Pre-oxygenation with 100% oxygen Intubation Type: IV induction and Combination inhalational/ intravenous induction Ventilation: Mask ventilation without difficulty LMA: LMA inserted LMA Size: 4.0 Grade View: Grade II Tube type: Oral Number of attempts: 1 Tube secured with: Tape Dental Injury: Teeth and Oropharynx as per pre-operative assessment

## 2014-08-01 NOTE — Anesthesia Postprocedure Evaluation (Signed)
  Anesthesia Post-op Note  Patient: Adrienne [name]  Procedure(s) Performed: Procedure(s): BREAST LUMPECTOMY WITH AXILLARY LYMPH NODE BIOPSY (Right) SENTINEL NODE BIOPSY (Right)  Anesthesia type:No value filed.  Patient location: PACU  Post pain: Pain level controlled  Post assessment: Post-op Vital signs reviewed, Patient's Cardiovascular Status Stable, Respiratory Function Stable, Patent Airway and No signs of Nausea or vomiting  Post vital signs: Reviewed and stable  Last Vitals:  Filed Vitals:   08/01/14 1152  BP: 149/77  Pulse: 90  Temp: 36.2 C  Resp: 18    Level of consciousness: awake, alert  and patient cooperative  Complications: No apparent anesthesia complications

## 2014-08-01 NOTE — Anesthesia Preprocedure Evaluation (Addendum)
Anesthesia Evaluation  Patient identified by MRN, date of birth, ID band Patient awake    Reviewed: Allergy & Precautions, NPO status , Patient's Chart, lab work & pertinent test results  History of Anesthesia Complications Negative for: history of anesthetic complications  Airway Mallampati: II       Dental no notable dental hx. (+) Teeth Intact   Pulmonary asthma ,    Pulmonary exam normal       Cardiovascular Exercise Tolerance: Good hypertension, Pt. on medications Normal cardiovascular examRhythm:Regular Rate:Normal     Neuro/Psych negative neurological ROS  negative psych ROS   GI/Hepatic Neg liver ROS, GERD-  Medicated and Controlled,  Endo/Other  negative endocrine ROS  Renal/GU Renal disease  negative genitourinary   Musculoskeletal negative musculoskeletal ROS (+)   Abdominal Normal abdominal exam  (+)   Peds negative pediatric ROS (+)  Hematology negative hematology ROS (+) anemia ,   Anesthesia Other Findings   Reproductive/Obstetrics negative OB ROS                            Anesthesia Physical Anesthesia Plan  ASA: II  Anesthesia Plan: General   Post-op Pain Management:    Induction: Intravenous  Airway Management Planned: LMA  Additional Equipment:   Intra-op Plan:   Post-operative Plan: Extubation in OR  Informed Consent: I have reviewed the patients History and Physical, chart, labs and discussed the procedure including the risks, benefits and alternatives for the proposed anesthesia with the patient or authorized representative who has indicated his/her understanding and acceptance.     Plan Discussed with: CRNA and Surgeon  Anesthesia Plan Comments:         Anesthesia Quick Evaluation

## 2014-08-01 NOTE — Telephone Encounter (Signed)
Patient may remove compressive wrap on Friday. F/U in office one week.

## 2014-08-01 NOTE — Transfer of Care (Signed)
Immediate Anesthesia Transfer of Care Note  Patient: Adrienne [name]  Procedure(s) Performed: Procedure(s): BREAST LUMPECTOMY WITH AXILLARY LYMPH NODE BIOPSY (Right) SENTINEL NODE BIOPSY (Right)  Patient Location: PACU  Anesthesia Type:General  Level of Consciousness: awake, oriented and patient cooperative  Airway & Oxygen Therapy: Patient Spontanous Breathing and Patient connected to face mask oxygen  Post-op Assessment: Report given to RN, Post -op Vital signs reviewed and stable and Patient moving all extremities  Post vital signs: stable  Last Vitals:  Filed Vitals:   08/01/14 1152  BP: 149/77  Pulse: 90  Temp: 36.2 C  Resp: 18    Complications: No apparent anesthesia complications

## 2014-08-01 NOTE — Discharge Instructions (Addendum)
AMBULATORY SURGERY  °DISCHARGE INSTRUCTIONS ° ° °1) The drugs that you were given will stay in your system until tomorrow so for the next 24 hours you should not: ° °A) Drive an automobile °B) Make any legal decisions °C) Drink any alcoholic beverage ° ° °2) You may resume regular meals tomorrow.  Today it is better to start with liquids and gradually work up to solid foods. ° °You may eat anything you prefer, but it is better to start with liquids, then soup and crackers, and gradually work up to solid foods. ° ° °3) Please notify your doctor immediately if you have any unusual bleeding, trouble breathing, redness and pain at the surgery site, drainage, fever, or pain not relieved by medication. ° °

## 2014-08-01 NOTE — Progress Notes (Signed)
Dr Bary Castilla in to see pt @ 1pm

## 2014-08-01 NOTE — Op Note (Signed)
Preoperative diagnosis: Right breast cancer.  Postoperative diagnosis:. Same.  Operative procedure: Wide local excision with ultrasound guidance, mastoplasty. Sentinel node biopsy.  Operative surgeon Ollen Bowl, M.D.  Anesthesia: Gen. by LMA, Marcaine 0.5% with 1-200,000 epinephrine, 30 mL.  EBL: 10 mL.  Clinical note: This 54 year old was identified with a new mammographic abnormality. Core biopsy showed evidence of a triple negative cancer. She was considered a candidate for breast conservation. She was injected with technetium sulfur colloid prior to procedure.  4 mL of normal saline mixed with methylene blue 2-1 was injected in the subareolar plexus prior to prep.  Operative note with the patient under adequate general anesthesia the breast was prepped with chlor prep and drape. The gamma finder was used to identify the area of increased uptake in the right axilla. Marcaine was infiltrated for postoperative analgesia. The skin was incised sharply and the remaining dissection completed electrocautery. A blue lymphatic was followed down to a single hot blue node. Touch prep was negative for macro metastatic disease as reported by Quay Burow, M.D. from pathology. The wound was slowly closed with 20 Vicryls figure-of-eight sutures to the adipose tissue and a running 4-like septic suture for the skin.  Ultrasound was used to confirm the previous biopsy site in the 12:00 position of the right breast. The area was marked and Marcaine infiltrated for postoperative analgesia. A curvilinear incision in the superior aspect the breast was carried down through skin subcutaneous venous tissue. A block of tissue measuring 4 x 4 by 5 cm was excised down to the pectoralis fascia. This was orientated and specimen radiograph obtained confirming the previously placed biopsy clip. Pathology reported the closest area was superficial, with an approximately 2 mm margin evident.  The breast was elevated off  the underlying pectoralis muscle taking the fascia with the breast specimen. This was then approximated in the midline with interrupted 20 Vicryls figure-of-eight sutures. This obliterated majority of the dead space. The adipose layer was approximated with interrupted 20 Vicryls sutures. The skin was closed with a running 4 like septic suture. Benzoin Steri-Strips were applied. Fluff gauze, Kerlix and Ace wrap was then applied.  The patient tolerated the procedure well and was taken to recovery in stable condition.

## 2014-08-01 NOTE — Anesthesia Postprocedure Evaluation (Deleted)
  Anesthesia Post-op Note  Patient: Adrienne [name]  Procedure(s) Performed: Procedure(s): BREAST LUMPECTOMY WITH AXILLARY LYMPH NODE BIOPSY (Right) SENTINEL NODE BIOPSY (Right)  Anesthesia type:No value filed.  Patient location: PACU  Post pain: Pain level controlled  Post assessment: Post-op Vital signs reviewed, Patient's Cardiovascular Status Stable, Respiratory Function Stable, Patent Airway and No signs of Nausea or vomiting  Post vital signs: Reviewed and stable  Last Vitals:  Filed Vitals:   08/01/14 1152  BP: 149/77  Pulse: 90  Temp: 36.2 C  Resp: 18    Level of consciousness: awake, alert  and patient cooperative  Complications: No apparent anesthesia complications

## 2014-08-01 NOTE — H&P (Signed)
Plan for surgery reviewed. No change in clinical condition. Lungs: Clear. Cardio: RR. Will proceed with SLN biopsy and wide excision of the right breast cancer.

## 2014-08-01 NOTE — Telephone Encounter (Signed)
THEY CALLED FROM [facility] PT FOR 08-08-14 @2 :30PM 1 week post op RT Breast wide ex.THEN CALLED BACK BECAUSE THE PT STATED U TOLD HER TO COME IN Friday BUT YOUR ORDERS STATED 1WK.PLEASE CLARIFY.

## 2014-08-02 ENCOUNTER — Encounter: Payer: Self-pay | Admitting: General Surgery

## 2014-08-04 ENCOUNTER — Telehealth: Payer: Self-pay | Admitting: General Surgery

## 2014-08-04 LAB — SURGICAL PATHOLOGY

## 2014-08-04 NOTE — Telephone Encounter (Signed)
Message left the pathology showed no residual disease.

## 2014-08-06 HISTORY — PX: BREAST SURGERY: SHX581

## 2014-08-08 ENCOUNTER — Ambulatory Visit (INDEPENDENT_AMBULATORY_CARE_PROVIDER_SITE_OTHER): Payer: 59 | Admitting: General Surgery

## 2014-08-08 ENCOUNTER — Telehealth: Payer: Self-pay

## 2014-08-08 ENCOUNTER — Encounter: Payer: Self-pay | Admitting: General Surgery

## 2014-08-08 ENCOUNTER — Other Ambulatory Visit: Payer: 59

## 2014-08-08 VITALS — BP 132/74 | HR 76 | Resp 12 | Ht 65.0 in | Wt 204.0 lb

## 2014-08-08 DIAGNOSIS — C50911 Malignant neoplasm of unspecified site of right female breast: Secondary | ICD-10-CM

## 2014-08-08 NOTE — Progress Notes (Addendum)
Patient ID: Adrienne [name], female   DOB: Dec 26, 1960, 54 y.o.   MRN: [id number]  Chief Complaint  Patient presents with  . Routine Post Op    right breast wide excision    HPI Adrienne [name] is a 54 y.o. female here today for her post op right breast wide excision done on 08/02/14. Patient states she is doing well.  She is moderately sore, and is still achieving benefit from Norco at night. HPI  Past Medical History  Diagnosis Date  . Seasonal allergies   . Unspecified essential hypertension   . Asthma   . 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 Dry eyes 2012  . Breast screening, unspecified   . Lump or mass in breast   . Special screening for malignant neoplasms, colon   . Obesity, unspecified   . Screening for obesity   . Breast cyst 2010    cyst- drained  . Constipation   . Kidney stone   . GERD (gastroesophageal reflux disease)   . Anemia   . Malignant neoplasm of breast (female), unspecified site 07/08/2014    Right breast, 5 mm, T1a,N0, triple negative  . Malignant neoplasm of upper-outer quadrant of female breast 05/08/2010    Left breast: T1b, N0, M); triple negative, DCIS present. No chemotherapy. MammoSite radiation.    Past Surgical History  Procedure Laterality Date  . Cesarean section  1988  . Colonoscopy  2011    Dr. Dionne Milo  . Breast mammosite  April 2012    mammosite placement and removal   . Kidney stone surgery  2003    stones removed by Dr. Quillian Quince- stent placed/removed  . Tubal ligation    . Breast lumpectomy with axillary lymph node biopsy Right 08/01/2014    Procedure: BREAST LUMPECTOMY WITH AXILLARY LYMPH NODE BIOPSY;  Surgeon: Robert Bellow, MD;  Location: [facility] ORS;  Service: General;  Laterality: Right;  . Sentinel node biopsy Right 08/01/2014     Procedure: SENTINEL NODE BIOPSY;  Surgeon: Robert Bellow, MD;  Location: [facility] ORS;  Service: General;  Laterality: Right;  . Breast surgery Left 2012    left breast wide excision  . Breast cyst aspiration  2010  . Breast surgery Right 08/06/2014    Wide excision/sentinel node biopsy, T1a, N0 triple negative    Family History  Problem Relation Age of Onset  . Ovarian cancer Mother     Social History History  Substance Use Topics  . Smoking status: Never Smoker   . Smokeless tobacco: Never Used  . Alcohol Use: Yes     Comment: occasionally    Allergies  Allergen Reactions  . Shellfish Allergy Other (See Comments)    Hives and swelling on ears and feet  . Tape Rash    Surgical tape from breast biopsy    Current Outpatient Prescriptions  Medication Sig Dispense Refill  . ALBUTEROL SULFATE HFA IN Inhale 2 puffs into the lungs as needed.    Marland Kitchen aspirin 81 MG tablet Take 81 mg by mouth daily.    Marland Kitchen b complex vitamins tablet Take 1 tablet by mouth daily.    . Cholecalciferol (VITAMIN D3) 1000 UNITS CAPS Take by mouth daily.    Marland Kitchen esomeprazole (NEXIUM) 40 MG capsule Take 40 mg by mouth 2 (two)  times daily as needed.    . fexofenadine (ALLEGRA) 180 MG tablet Take 180 mg by mouth daily as needed.     . hydrochlorothiazide (HYDRODIURIL) 25 MG tablet Take 12.5 mg by mouth daily.     Marland Kitchen HYDROcodone-acetaminophen (NORCO) 5-325 MG per tablet Take 1-2 tablets by mouth every 4 (four) hours as needed for moderate pain or severe pain. 30 tablet 0  . POTASSIUM PO Take 1,000 Units by mouth daily.     No current facility-administered medications for this visit.    Review of Systems Review of Systems  Constitutional: Negative.   Respiratory: Negative.   Cardiovascular: Negative.     Blood pressure 132/74, pulse 76, resp. rate 12, height 5\' 5"  (1.651 m), weight 204 lb (92.534 kg).  Physical Exam Physical Exam  Constitutional: She is oriented to person, place, and time. She appears  well-developed and well-nourished.  Pulmonary/Chest:    Neurological: She is alert and oriented to person, place, and time.  Skin: Skin is warm and dry.    Data Reviewed Ultrasound examination of the upper-outer quadrant of the right breast showed a 1.8 x 3.6 x 4.1 seroma cavity with a minimal distance of 1.2 cm to the skin.  Assessment    Doing well status post right breast wide excision and sentinel node biopsy. Candidate for partial breast radiation.    Plan    Radiation oncologist appointment.    Patient is scheduled to see Dr Baruch Gouty at [facility] on 08/17/14 at 10:30 am. Patient is aware of date and time.   A new prescription for Norco 5/325, #30 with the inscription 1-2 by mouth every 4 hours when necessary for pain was provided. Refills. PCP:  Kennieth Francois 08/09/2014, 1:19 PM

## 2014-08-08 NOTE — Patient Instructions (Addendum)
Continue self breast exams. Call office for any new breast issues or concerns. Radiation oncologist appointment  Patient is scheduled to see Dr Baruch Gouty at [facility] on 08/17/14 at 10:30 am. Patient is aware of date and time.

## 2014-08-08 NOTE — Telephone Encounter (Signed)
Phoned patient to follow-up post right lumpectomy.  States she is doing well, and just saw Dr. Bary Castilla for his follow-up appointment. He is referring her to Dr. Baruch Gouty.  Plan to meet her at that appointment.  Patient states she is coming to Survivor celebration here at the [facility] on 08/10/14.    Oncology Nurse Navigator Documentation  Oncology Nurse Navigator Flowsheets 08/08/2014  Navigator Encounter Type Telephone  Patient Visit Type Follow-up  Time Spent with Patient 15

## 2014-08-09 ENCOUNTER — Encounter: Payer: Self-pay | Admitting: General Surgery

## 2014-08-17 ENCOUNTER — Encounter: Payer: Self-pay | Admitting: *Deleted

## 2014-08-17 ENCOUNTER — Ambulatory Visit
Admission: RE | Admit: 2014-08-17 | Discharge: 2014-08-17 | Disposition: A | Discharge status: Home / Self Care | Payer: 59 | Source: Ambulatory Visit | Attending: Radiation Oncology | Admitting: Radiation Oncology

## 2014-08-17 ENCOUNTER — Encounter: Payer: Self-pay | Admitting: Radiation Oncology

## 2014-08-17 VITALS — BP 151/86 | HR 72 | Temp 96.5°F | Resp 18 | Ht 65.0 in | Wt 205.5 lb

## 2014-08-17 DIAGNOSIS — Z171 Estrogen receptor negative status [ER-]: Secondary | ICD-10-CM | POA: Insufficient documentation

## 2014-08-17 DIAGNOSIS — C50411 Malignant neoplasm of upper-outer quadrant of right female breast: Secondary | ICD-10-CM | POA: Insufficient documentation

## 2014-08-17 DIAGNOSIS — Z510 Encounter for antineoplastic radiation therapy: Secondary | ICD-10-CM | POA: Insufficient documentation

## 2014-08-17 DIAGNOSIS — C50911 Malignant neoplasm of unspecified site of right female breast: Secondary | ICD-10-CM

## 2014-08-17 HISTORY — DX: Malignant neoplasm of unspecified site of unspecified female breast: C50.919

## 2014-08-17 NOTE — Consult Note (Signed)
Radiation Oncology NEW PATIENT EVALUATION  Name: Adrienne [name]  MRN: [id number]  Date:   08/17/2014     DOB: 1960-11-04   This 54 y.o. 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  REFERRING PHYSICIAN: Guadalupe Maple, MD  CHIEF COMPLAINT: Breast cancer  DIAGNOSIS: The encounter diagnosis was Malignant neoplasm of female breast, right.   PREVIOUS INVESTIGATIONS:  Mammograms and ultrasound reviewed Surgical pathology reports reviewed Clinical notes reviewed  HPI: Patient is a 54 year old female well known to our department having been treated for years prior for early stage breast cancer of the left breast status post accelerated partial breast irradiation through the MammoSite catheter. Recently presented with an abnormal mammogram of the right breast showing a 6 mm area at 11:00 position 9 cm from the nipple confirm is a hypoechoic mass. Clip was placed and biopsy was performed showing invasive mammary carcinoma. 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 Margin of resection was 2 mm. One sentinel lymph node was negative for metastatic disease to him was overall score of 3. She has done well postoperatively. She's been seen by surgeon and is a candidate for accelerated partial breast irradiation. She specifically denies breast tenderness cough or bone pain. PLANNED TREATMENT REGIMEN: Accelerated partial breast irradiation of the right breast  PAST MEDICAL HISTORY:  has a past medical history of Seasonal allergies; Unspecified essential hypertension; Asthma; Personal history of malignant neoplasm of breast (2012); Dry eyes (2012); Breast screening, unspecified; Lump or mass in breast; Special screening for malignant neoplasms, colon; Obesity,  unspecified; Screening for obesity; Breast cyst (2010); Constipation; Kidney stone; GERD (gastroesophageal reflux disease); Anemia; Malignant neoplasm of breast (female), unspecified site (07/08/2014); Malignant neoplasm of upper-outer quadrant of female breast (05/08/2010); and Breast cancer.    PAST SURGICAL HISTORY:  Past Surgical History  Procedure Laterality Date  . Cesarean section  1988  . Colonoscopy  2011    Dr. Dionne Milo  . Breast mammosite  April 2012    mammosite placement and removal   . Kidney stone surgery  2003    stones removed by Dr. Quillian Quince- stent placed/removed  . Tubal ligation    . Breast lumpectomy with axillary lymph node biopsy Right 08/01/2014    Procedure: BREAST LUMPECTOMY WITH AXILLARY LYMPH NODE BIOPSY;  Surgeon: Robert Bellow, MD;  Location: [facility] ORS;  Service: General;  Laterality: Right;  . Sentinel node biopsy Right 08/01/2014    Procedure: SENTINEL NODE BIOPSY;  Surgeon: Robert Bellow, MD;  Location: [facility] ORS;  Service: General;  Laterality: Right;  . Breast surgery Left 2012    left breast wide excision  . Breast cyst aspiration  2010  . Breast surgery Right 08/06/2014    Wide excision/sentinel node biopsy, T1a, N0 triple negative    FAMILY HISTORY: family history includes Ovarian cancer in her mother.  SOCIAL HISTORY:  reports that she has never smoked. She has never used smokeless tobacco. She reports that she drinks alcohol. She reports that she does not use illicit drugs.  ALLERGIES: Shellfish allergy and Tape  MEDICATIONS:  Current Outpatient Prescriptions  Medication Sig Dispense Refill  . ALBUTEROL SULFATE HFA IN Inhale 2 puffs into the lungs as needed.    Marland Kitchen aspirin 81 MG tablet Take 81 mg by  mouth daily.    Marland Kitchen b complex vitamins tablet Take 1 tablet by mouth daily.    . Cholecalciferol (VITAMIN D3) 1000 UNITS CAPS Take by mouth daily.    Marland Kitchen esomeprazole (NEXIUM) 40 MG capsule Take 40 mg by mouth 2 (two) times daily as needed.    .  fexofenadine (ALLEGRA) 180 MG tablet Take 180 mg by mouth daily as needed.     . hydrochlorothiazide (HYDRODIURIL) 25 MG tablet Take 12.5 mg by mouth daily.     Marland Kitchen HYDROcodone-acetaminophen (NORCO) 5-325 MG per tablet Take 1-2 tablets by mouth every 4 (four) hours as needed for moderate pain or severe pain. 30 tablet 0  . POTASSIUM PO Take 1,000 Units by mouth daily.     No current facility-administered medications for this encounter.    ECOG PERFORMANCE STATUS:  0 - Asymptomatic  REVIEW OF SYSTEMS:  Patient denies any weight loss, fatigue, weakness, fever, chills or night sweats. Patient denies any loss of vision, blurred vision. Patient denies any ringing  of the ears or hearing loss. No irregular heartbeat. Patient denies heart murmur or history of fainting. Patient denies any chest pain or pain radiating to her upper extremities. Patient denies any shortness of breath, difficulty breathing at night, cough or hemoptysis. Patient denies any swelling in the lower legs. Patient denies any nausea vomiting, vomiting of blood, or coffee ground material in the vomitus. Patient denies any stomach pain. Patient states has had normal bowel movements no significant constipation or diarrhea. Patient denies any dysuria, hematuria or significant nocturia. Patient denies any problems walking, swelling in the joints or loss of balance. Patient denies any skin changes, loss of hair or loss of weight. Patient denies any excessive worrying or anxiety or significant depression. Patient denies any problems with insomnia. Patient denies excessive thirst, polyuria, polydipsia. Patient denies any swollen glands, patient denies easy bruising or easy bleeding. Patient denies any recent infections, allergies or URI. Patient "s visual fields have not changed significantly in recent time.    PHYSICAL EXAM: BP 151/86 mmHg  Pulse 72  Temp(Src) 96.5 F (35.8 C)  Resp 18  Ht 5\' 5"  (1.651 m)  Wt 205 lb 7.5 oz (93.2 kg)  BMI  34.19 kg/m2 Well-developed female in NAD. Left breast has area of scarring surrounding her old lumpectomy site consistent with high dose rate remote afterloading. She status post wide local excision of the right breast which is healing well. No other dominant mass or nodularity is noted in either breast in 2 positions examined. No axillary or supraclavicular adenopathy is identified. Well-developed well-nourished patient in NAD. HEENT reveals PERLA, EOMI, discs not visualized.  Oral cavity is clear. No oral mucosal lesions are identified. Neck is clear without evidence of cervical or supraclavicular adenopathy. Lungs are clear to A&P. Cardiac examination is essentially unremarkable with regular rate and rhythm without murmur rub or thrill. Abdomen is benign with no organomegaly or masses noted. Motor sensory and DTR levels are equal and symmetric in the upper and lower extremities. Cranial nerves II through XII are grossly intact. Proprioception is intact. No peripheral adenopathy or edema is identified. No motor or sensory levels are noted. Crude visual fields are within normal range.   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  PLAN: At this time patient is a candidate for accelerated partial breast irradiation of the right breast. We will coordinate with surgeon's staff to place MammoSite balloon and then BrachyVision treatment planning to deliver 3400 cGy in 10 fractions at 340 cGy twice a day using high dose rate remote afterloading. Risks and benefits of treatment including possible small skin reaction, fatigue, permanent thickening lumpectomy site all were discussed in detail. Also possibility that  balloon will not meter criteria and patient may need whole breast radiation. Patient is also seen medical oncology in the past based on the fact this is a small triple negative disease in the contralateral breast will have her reevaluated by medical oncology after completion of radiation. Patient and husband seem to comprehend my treatment plan well.  I would like to take this opportunity for allowing me to participate in the care of your patient.Armstead Peaks., MD

## 2014-08-17 NOTE — Progress Notes (Signed)
Patient/spouse here for an initial evaluation of Mammosite right breast.  She is 31/2 years out Mammosite treatment to the left breast.

## 2014-08-17 NOTE — Progress Notes (Signed)
Met patient and friend at initial radiation oncology visit.   Plans for mammosite.  Dr. Dwyane Luo office to call patient to schedule placement.  Per Dr. Baruch Gouty, patient will be scheduled for follow-up with Dr. Ma Hillock post radiation.  Patient has questions about genetic testing.  She meets guidelines as having a triple negative breast cancer diagnosed less than 56 years old at age of diagnosis.

## 2014-08-22 ENCOUNTER — Telehealth: Payer: Self-pay | Admitting: *Deleted

## 2014-08-22 MED ORDER — CEFADROXIL 500 MG PO CAPS: 500.0000 mg | ORAL_CAPSULE | Freq: Two times a day (BID) | ORAL | Status: DC

## 2014-08-22 NOTE — Telephone Encounter (Signed)
Mammosite schedule reviewed with the patient Placement   08-28-14         at [facility] Scan 08-30-14 Treat 6-27 to July 1 Aware Abigail Butts will be calling her for more details Aware of ATB and directions reviewed. Aware no showers and to wear her bra while mammosite in place. Pt agrees.

## 2014-08-22 NOTE — Telephone Encounter (Signed)
Patient was calling you back ,she stated that you left her a message!

## 2014-08-22 NOTE — Telephone Encounter (Signed)
RX sent

## 2014-08-28 ENCOUNTER — Ambulatory Visit: Payer: 59

## 2014-08-28 ENCOUNTER — Other Ambulatory Visit: Payer: Self-pay | Admitting: Family Medicine

## 2014-08-28 ENCOUNTER — Encounter: Payer: Self-pay | Admitting: General Surgery

## 2014-08-28 ENCOUNTER — Ambulatory Visit (INDEPENDENT_AMBULATORY_CARE_PROVIDER_SITE_OTHER): Payer: 59 | Admitting: General Surgery

## 2014-08-28 VITALS — BP 140/78 | HR 76 | Resp 12 | Ht 65.0 in | Wt 207.0 lb

## 2014-08-28 DIAGNOSIS — N200 Calculus of kidney: Secondary | ICD-10-CM

## 2014-08-28 DIAGNOSIS — C50911 Malignant neoplasm of unspecified site of right female breast: Secondary | ICD-10-CM

## 2014-08-28 DIAGNOSIS — R3129 Other microscopic hematuria: Secondary | ICD-10-CM

## 2014-08-28 DIAGNOSIS — K869 Disease of pancreas, unspecified: Secondary | ICD-10-CM

## 2014-08-28 NOTE — Progress Notes (Signed)
Patient ID: Adrienne [name], female   DOB: 1960-10-08, 54 y.o.   MRN: [id number]  Chief Complaint  Patient presents with  . Procedure    right mammosite    HPI Adrienne [name] is a 54 y.o. female. here today for a right mammosite placement. The patient has been evaluated by radiation oncology. She is felt to be a candidate for partial breast radiation. She did make use of her anabiotic's as requested. She is accompanied today by her husband who was present. For the procedure.   HPI  Past Medical History  Diagnosis Date  . Seasonal allergies   . Unspecified essential hypertension   . Asthma   . 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 Dry eyes 2012  . Breast screening, unspecified   . Lump or mass in breast   . Special screening for malignant neoplasms, colon   . Obesity, unspecified   . Screening for obesity   . Breast cyst 2010    cyst- drained  . Constipation   . Kidney stone   . GERD (gastroesophageal reflux disease)   . Anemia   . Malignant neoplasm of breast (female), unspecified site 07/08/2014    Right breast, 5 mm, T1a,N0, triple negative  . Malignant neoplasm of upper-outer quadrant of female breast 05/08/2010    Left breast: T1b, N0, M); triple negative, DCIS present. No chemotherapy. MammoSite radiation.  . Breast cancer     Past Surgical History  Procedure Laterality Date  . Cesarean section  1988  . Colonoscopy  2011    Dr. Dionne Milo  . Breast mammosite  April 2012    mammosite placement and removal   . Kidney stone surgery  2003    stones removed by Dr. Quillian Quince- stent placed/removed  . Tubal ligation    . Breast lumpectomy with axillary lymph node biopsy Right 08/01/2014    Procedure: BREAST LUMPECTOMY WITH AXILLARY LYMPH NODE BIOPSY;  Surgeon: Robert Bellow, MD;   Location: [facility];  Service: General;  Laterality: Right;  . Sentinel node biopsy Right 08/01/2014    Procedure: SENTINEL NODE BIOPSY;  Surgeon: Robert Bellow, MD;  Location: [facility];  Service: General;  Laterality: Right;  . Breast surgery Left 2012    left breast wide excision  . Breast cyst aspiration  2010  . Breast surgery Right 08/06/2014    Wide excision/sentinel node biopsy, T1a, N0 triple negative    Family History  Problem Relation Age of Onset  . Ovarian cancer Mother     Social History History  Substance Use Topics  . Smoking status: Never Smoker   . Smokeless tobacco: Never Used  . Alcohol Use: Yes     Comment: occasionally    Allergies  Allergen Reactions  . Shellfish Allergy Other (See Comments)    Hives and swelling on ears and feet  . Tape Rash    Surgical tape from breast biopsy    Current Outpatient Prescriptions  Medication Sig Dispense Refill  . ALBUTEROL SULFATE HFA IN Inhale 2 puffs into the lungs as needed.    Marland Kitchen aspirin 81 MG tablet Take 81 mg by mouth daily.    Marland Kitchen b complex vitamins tablet Take 1 tablet by mouth daily.    . cefadroxil (DURICEF) 500 MG capsule Take 1 capsule (  500 mg total) by mouth 2 (two) times daily. Start one hour before procedure on Monday 08-28-14 24 capsule 0  . Cholecalciferol (VITAMIN D3) 1000 UNITS CAPS Take by mouth daily.    Marland Kitchen esomeprazole (NEXIUM) 40 MG capsule Take 40 mg by mouth 2 (two) times daily as needed.    . fexofenadine (ALLEGRA) 180 MG tablet Take 180 mg by mouth daily as needed.     . hydrochlorothiazide (HYDRODIURIL) 25 MG tablet Take 12.5 mg by mouth daily.     Marland Kitchen HYDROcodone-acetaminophen (NORCO) 5-325 MG per tablet Take 1-2 tablets by mouth every 4 (four) hours as needed for moderate pain or severe pain. 30 tablet 0  . POTASSIUM PO Take 1,000 Units by mouth daily.     No current facility-administered medications for this visit.    Review of Systems Review of Systems  Constitutional: Negative.    Respiratory: Negative.   Cardiovascular: Negative.     Blood pressure 140/78, pulse 76, resp. rate 12, height 5\' 5"  (1.651 m), weight 207 lb (93.895 kg).  Physical Exam Physical Exam  Constitutional: She is oriented to person, place, and time. She appears well-developed and well-nourished.  Pulmonary/Chest:    Neurological: She is alert and oriented to person, place, and time.  Skin: Skin is warm and dry.    Data Reviewed Ultrasound examination showed a cavity 1.02 cm below the skin edge below the right upper outer quadrant incision.  10 mL of 0.5% Xylocaine with 0.25% Marcaine with 1-200,000 units of epinephrine was utilized after skin preparation with alcohol. Chlor prep was applied to the skin. An 8 mm trocar was placed into the seroma cavity draining about 30 mL of old serosanguineous fluid. The cavity evaluation device was placed under ultrasound guidance. When inflated with 35 cm of saline there was a 0.89 cm skin spacing felt to be adequate for partial breast radiation.  The cavity evaluation device was removed and replaced by the treatment balloon. This was inflated 35 cm and again a 0.89 cm skin spacing was noted. The balloon was noted to be spherical both on visual inspection and on ultrasound exam.  Bacitracin ointment was applied to the catheter exit site. Bulky gauze dressing applied. Postplacement restrictions reviewed.  Assessment    Good tolerance of MammoSite balloon placement.    Plan    The patient will continue Vicente Males biotics as previously prescribed. She will undergo simulation on June 22 and begin treatment on June 27. Follow-up will be the week of July 5.       Robert Bellow 08/28/2014, 12:26 PM

## 2014-08-30 ENCOUNTER — Ambulatory Visit
Admission: RE | Admit: 2014-08-30 | Discharge: 2014-08-30 | Disposition: A | Discharge status: Home / Self Care | Payer: 59 | Source: Ambulatory Visit | Attending: Radiation Oncology | Admitting: Radiation Oncology

## 2014-08-30 DIAGNOSIS — C50411 Malignant neoplasm of upper-outer quadrant of right female breast: Secondary | ICD-10-CM | POA: Diagnosis not present

## 2014-08-30 DIAGNOSIS — Z171 Estrogen receptor negative status [ER-]: Secondary | ICD-10-CM | POA: Diagnosis not present

## 2014-08-30 DIAGNOSIS — Z510 Encounter for antineoplastic radiation therapy: Secondary | ICD-10-CM | POA: Diagnosis not present

## 2014-09-04 ENCOUNTER — Ambulatory Visit
Admission: RE | Admit: 2014-09-04 | Discharge: 2014-09-04 | Disposition: A | Discharge status: Home / Self Care | Payer: 59 | Source: Ambulatory Visit | Attending: Radiation Oncology | Admitting: Radiation Oncology

## 2014-09-04 DIAGNOSIS — C50411 Malignant neoplasm of upper-outer quadrant of right female breast: Secondary | ICD-10-CM | POA: Diagnosis not present

## 2014-09-05 ENCOUNTER — Ambulatory Visit
Admission: RE | Admit: 2014-09-05 | Discharge: 2014-09-05 | Disposition: A | Discharge status: Home / Self Care | Payer: 59 | Source: Ambulatory Visit | Attending: Radiation Oncology | Admitting: Radiation Oncology

## 2014-09-05 DIAGNOSIS — C50411 Malignant neoplasm of upper-outer quadrant of right female breast: Secondary | ICD-10-CM | POA: Diagnosis not present

## 2014-09-06 ENCOUNTER — Ambulatory Visit
Admission: RE | Admit: 2014-09-06 | Discharge: 2014-09-06 | Disposition: A | Discharge status: Home / Self Care | Payer: 59 | Source: Ambulatory Visit | Attending: Radiation Oncology | Admitting: Radiation Oncology

## 2014-09-06 DIAGNOSIS — C50411 Malignant neoplasm of upper-outer quadrant of right female breast: Secondary | ICD-10-CM | POA: Diagnosis not present

## 2014-09-07 ENCOUNTER — Ambulatory Visit
Admission: RE | Admit: 2014-09-07 | Discharge: 2014-09-07 | Disposition: A | Discharge status: Home / Self Care | Payer: 59 | Source: Ambulatory Visit | Attending: Radiation Oncology | Admitting: Radiation Oncology

## 2014-09-07 DIAGNOSIS — C50411 Malignant neoplasm of upper-outer quadrant of right female breast: Secondary | ICD-10-CM | POA: Diagnosis not present

## 2014-09-08 ENCOUNTER — Ambulatory Visit
Admission: RE | Admit: 2014-09-08 | Discharge: 2014-09-08 | Disposition: A | Discharge status: Home / Self Care | Payer: 59 | Source: Ambulatory Visit | Attending: Radiation Oncology | Admitting: Radiation Oncology

## 2014-09-08 ENCOUNTER — Encounter: Payer: Self-pay | Admitting: *Deleted

## 2014-09-08 DIAGNOSIS — C50411 Malignant neoplasm of upper-outer quadrant of right female breast: Secondary | ICD-10-CM | POA: Diagnosis not present

## 2014-09-14 ENCOUNTER — Ambulatory Visit (INDEPENDENT_AMBULATORY_CARE_PROVIDER_SITE_OTHER): Payer: 59 | Admitting: General Surgery

## 2014-09-14 ENCOUNTER — Encounter: Payer: Self-pay | Admitting: General Surgery

## 2014-09-14 VITALS — BP 116/68 | HR 86 | Resp 12 | Ht 65.0 in | Wt 203.0 lb

## 2014-09-14 DIAGNOSIS — C50911 Malignant neoplasm of unspecified site of right female breast: Secondary | ICD-10-CM

## 2014-09-14 NOTE — Patient Instructions (Signed)
Patient to return in five months

## 2014-09-14 NOTE — Progress Notes (Signed)
Patient ID: Adrienne [name], female   DOB: [birth year], 53 y.o.   MRN: [id number]  Chief Complaint  Patient presents with  . Follow-up    mammosite    HPI Adrienne [name] is a 54 y.o. female here today following up from a mammosite placement which was done on 08/28/14 and was removed on 09/08/14. HPI  Past Medical History  Diagnosis Date  . Seasonal allergies   . Unspecified essential hypertension   . Asthma   . 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 Dry eyes 2012  . Breast screening, unspecified   . Lump or mass in breast   . Special screening for malignant neoplasms, colon   . Obesity, unspecified   . Screening for obesity   . Breast cyst 2010    cyst- drained  . Constipation   . Kidney stone   . GERD (gastroesophageal reflux disease)   . Anemia   . Malignant neoplasm of breast (female), unspecified site 07/08/2014    Right breast, 5 mm, T1a,N0, triple negative  . Malignant neoplasm of upper-outer quadrant of female breast 05/08/2010    Left breast: T1b, N0, M); triple negative, DCIS present. No chemotherapy. MammoSite radiation.  . Breast cancer     Past Surgical History  Procedure Laterality Date  . Cesarean section  1988  . Colonoscopy  2011    Dr. Dionne Milo  . Breast mammosite  April 2012    mammosite placement and removal   . Kidney stone surgery  2003    stones removed by Dr. Quillian Quince- stent placed/removed  . Tubal ligation    . Breast lumpectomy with axillary lymph node biopsy Right 08/01/2014    Procedure: BREAST LUMPECTOMY WITH AXILLARY LYMPH NODE BIOPSY;  Surgeon: Robert Bellow, MD;  Location: [facility];  Service: General;  Laterality: Right;  . Sentinel node biopsy Right 08/01/2014    Procedure: SENTINEL NODE BIOPSY;  Surgeon: Robert Bellow, MD;  Location: [facility];   Service: General;  Laterality: Right;  . Breast surgery Left 2012    left breast wide excision  . Breast cyst aspiration  2010  . Breast surgery Right 08/06/2014    Wide excision/sentinel node biopsy, T1a, N0 triple negative    Family History  Problem Relation Age of Onset  . Ovarian cancer Mother     Social History History  Substance Use Topics  . Smoking status: Never Smoker   . Smokeless tobacco: Never Used  . Alcohol Use: Yes     Comment: occasionally    Allergies  Allergen Reactions  . Shellfish Allergy Other (See Comments)    Hives and swelling on ears and feet  . Tape Rash    Surgical tape from breast biopsy    Current Outpatient Prescriptions  Medication Sig Dispense Refill  . ALBUTEROL SULFATE HFA IN Inhale 2 puffs into the lungs as needed.    Marland Kitchen aspirin 81 MG tablet Take 81 mg by mouth daily.    Marland Kitchen b complex vitamins tablet Take 1 tablet by mouth daily.    Marland Kitchen esomeprazole (NEXIUM) 40 MG capsule Take 40 mg by mouth 2 (two) times daily as needed.    . fexofenadine (ALLEGRA) 180 MG tablet Take 180 mg by mouth daily as needed.     . hydrochlorothiazide (HYDRODIURIL) 25  MG tablet Take 12.5 mg by mouth daily.     Marland Kitchen HYDROcodone-acetaminophen (NORCO) 5-325 MG per tablet Take 1-2 tablets by mouth every 4 (four) hours as needed for moderate pain or severe pain. 30 tablet 0  . POTASSIUM PO Take 1,000 Units by mouth daily.    . Cholecalciferol (VITAMIN D3) 1000 UNITS CAPS Take by mouth daily.     No current facility-administered medications for this visit.    Review of Systems Review of Systems  Constitutional: Negative.   Respiratory: Negative.   Cardiovascular: Negative.     Blood pressure 116/68, pulse 86, resp. rate 12, height 5\' 5"  (1.651 m), weight 203 lb (92.08 kg).  Physical Exam Physical Exam  Constitutional: She is oriented to person, place, and time. She appears well-developed and well-nourished.  HENT:  Mouth/Throat: Oropharynx is clear and moist.   Eyes: Conjunctivae are normal. No scleral icterus.  Neck: Neck supple.  Cardiovascular: Normal rate, regular rhythm and normal heart sounds.   Pulmonary/Chest: Effort normal and breath sounds normal.    Right breast incision is clean and healing well.   Lymphadenopathy:    She has no cervical adenopathy.    She has no axillary adenopathy.  Neurological: She is alert and oriented to person, place, and time.  Skin: Skin is warm and dry.    Data Reviewed Radiation oncology notes reviewed.  Assessment    Doing well status post axilla right breast radiation.    Plan    The patient reports that she has an  appointment with Dr. Ma Hillock for medical oncology later this month. Considering the small tumor size I don't think that she will require any additional treatments.    Patient to return in five months for exam.  PCP:  Kennieth Francois 09/14/2014, 9:22 AM

## 2014-09-22 ENCOUNTER — Inpatient Hospital Stay: Payer: 59 | Attending: Internal Medicine | Admitting: Internal Medicine

## 2014-09-22 ENCOUNTER — Encounter: Payer: Self-pay | Admitting: Internal Medicine

## 2014-09-22 VITALS — BP 125/86 | HR 73 | Temp 97.8°F | Resp 18 | Ht 65.0 in | Wt 206.1 lb

## 2014-09-22 DIAGNOSIS — I10 Essential (primary) hypertension: Secondary | ICD-10-CM | POA: Diagnosis not present

## 2014-09-22 DIAGNOSIS — Z853 Personal history of malignant neoplasm of breast: Secondary | ICD-10-CM | POA: Insufficient documentation

## 2014-09-22 DIAGNOSIS — C50911 Malignant neoplasm of unspecified site of right female breast: Secondary | ICD-10-CM | POA: Diagnosis not present

## 2014-09-22 DIAGNOSIS — Z171 Estrogen receptor negative status [ER-]: Secondary | ICD-10-CM | POA: Diagnosis not present

## 2014-09-22 DIAGNOSIS — Z8041 Family history of malignant neoplasm of ovary: Secondary | ICD-10-CM | POA: Diagnosis not present

## 2014-09-22 DIAGNOSIS — Z79899 Other long term (current) drug therapy: Secondary | ICD-10-CM | POA: Insufficient documentation

## 2014-09-22 DIAGNOSIS — Z1371 Encounter for nonprocreative screening for genetic disease carrier status: Secondary | ICD-10-CM

## 2014-09-22 HISTORY — DX: Encounter for nonprocreative screening for genetic disease carrier status: Z13.71

## 2014-09-22 NOTE — Progress Notes (Signed)
Pt here for breast cancer. Had mammosite radiation and she has twinges of pain from time to time.  Eating and drinking ok.

## 2014-09-25 ENCOUNTER — Telehealth: Payer: Self-pay | Admitting: General Surgery

## 2014-09-25 NOTE — Telephone Encounter (Signed)
PATIENT CALLED & WANTED TO GIVE A REPORT OF DR PANDIT'S OFC VISITS. SHE SAID HE WANTED TO DO A MAMMO PRINT & GENETIC TESTING.

## 2014-09-27 NOTE — Progress Notes (Signed)
[facility]  Telephone:([phone]) [phone] Fax:(336) [phone]     ID: Adrienne [name] OB: 10-May-1960  MR#: [id number]  QMV#:[id number]  Patient Care Team: Guadalupe Maple, MD as PCP - General (Unknown Physician Specialty) Robert Bellow, MD (General Surgery) Guadalupe Maple, MD (Family Medicine)  CHIEF COMPLAINT/DIAGNOSIS:  1. 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 Surgical pathology reports tumor size 0.5 cm, grade 3, margins uninvolved by invasive carcinoma. One sentinel lymph node negative. ER negative, PR negative, HER-2/neu negative (1+ on IHC)  -  patient referred here for Oncology evaluation regarding systemic adjuvant treatment.  2. 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  Tumor size 0.7 cm, grade 3, margins negative (closest margin is 0.8 mm to anterior).  1 sentinel lymph node negative.  ER negative (0 - 5%), PR negative (<1 - 5%) and HER-2/neu negative by FISH.  HISTORY OF PRESENT ILLNESS:   REVIEW OF SYSTEMS:   ROS CONSTITUTIONAL: As in HPI above. No chills, fever or sweats.    ENT:  No headache, dizziness or epistaxis. No ear or jaw pain. No sinus symptoms. RESPIRATORY:   No cough.  No shortness of breath. No wheezing. No hemoptysis. CARDIAC:  No palpitations.  No retrosternal chest pain. No orthopnea, PND. GI:  No abdominal pain, nausea or vomiting. No diarrhea.   GU:  No dysuria or hematuria.  SKIN: No rashes or pruritus. HEMATOLOGIC: denies bleeding symptoms MUSCULOSKELETAL:  No new bone pains.  EXTREMITY:  No new swelling or pain.  NEURO:  No focal weakness. No numbness or tingling of extremities.  No seizures.   ENDOCRINE:  No polyuria or polydipsia.   PS ECOG 0.  PAST MEDICAL HISTORY: Reviewed. Past Medical History  Diagnosis Date  .  Seasonal allergies   . Unspecified essential hypertension   . Asthma   . 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 Dry eyes 2012  . Breast screening, unspecified   . Lump or mass in breast   . Special screening for malignant neoplasms, colon   . Obesity, unspecified   . Screening for obesity   . Breast cyst 2010    cyst- drained  . Constipation   . Kidney stone   . GERD (gastroesophageal reflux disease)   . Anemia   . Malignant neoplasm of breast (female), unspecified site 07/08/2014    Right breast, 5 mm, T1a,N0, triple negative  . Malignant neoplasm of upper-outer quadrant of female breast 05/08/2010    Left breast: T1b, N0, M); triple negative, DCIS present. No chemotherapy. MammoSite radiation.  . Breast cancer     PAST SURGICAL HISTORY: Reviewed. Past Surgical History  Procedure Laterality Date  . Cesarean section  1988  . Colonoscopy  2011    Dr. Dionne Milo  . Breast mammosite  April 2012    mammosite placement and removal   . Kidney stone surgery  2003    stones removed by Dr. Quillian Quince- stent placed/removed  . Tubal ligation    . Breast lumpectomy with axillary lymph node biopsy Right 08/01/2014    Procedure: BREAST LUMPECTOMY WITH AXILLARY LYMPH NODE BIOPSY;  Surgeon:  Robert Bellow, MD;  Location: [facility];  Service: General;  Laterality: Right;  . Sentinel node biopsy Right 08/01/2014    Procedure: SENTINEL NODE BIOPSY;  Surgeon: Robert Bellow, MD;  Location: [facility];  Service: General;  Laterality: Right;  . Breast surgery Left 2012    left breast wide excision  . Breast cyst aspiration  2010  . Breast surgery Right 08/06/2014    Wide excision/sentinel node biopsy, T1a, N0 triple negative    FAMILY HISTORY: Reviewed. Family History  Problem Relation Age of Onset  .  Ovarian cancer Mother     SOCIAL HISTORY: Reviewed. History  Substance Use Topics  . Smoking status: Never Smoker   . Smokeless tobacco: Never Used  . Alcohol Use: Yes     Comment: occasionally    Allergies  Allergen Reactions  . Shellfish Allergy Other (See Comments)    Hives and swelling on ears and feet  . Tape Rash    Surgical tape from breast biopsy    Current Outpatient Prescriptions  Medication Sig Dispense Refill  . ALBUTEROL SULFATE HFA IN Inhale 2 puffs into the lungs as needed.    Marland Kitchen aspirin 81 MG tablet Take 81 mg by mouth daily.    Marland Kitchen b complex vitamins tablet Take 1 tablet by mouth daily.    . Cholecalciferol (VITAMIN D3) 1000 UNITS CAPS Take by mouth daily.    Marland Kitchen esomeprazole (NEXIUM) 40 MG capsule Take 40 mg by mouth 2 (two) times daily as needed.    . fexofenadine (ALLEGRA) 180 MG tablet Take 180 mg by mouth daily as needed.     . hydrochlorothiazide (HYDRODIURIL) 25 MG tablet Take 12.5 mg by mouth daily.     Marland Kitchen HYDROcodone-acetaminophen (NORCO) 5-325 MG per tablet Take 1-2 tablets by mouth every 4 (four) hours as needed for moderate pain or severe pain. 30 tablet 0  . POTASSIUM PO Take 1,000 Units by mouth daily.     No current facility-administered medications for this visit.    PHYSICAL EXAM: Filed Vitals:   09/22/14 1009  BP: 125/86  Pulse: 73  Temp: 97.8 F (36.6 C)  Resp: 18     Body mass index is 34.3 kg/(m^2).    ECOG FS:0 - Asymptomatic  GENERAL: Patient is alert and oriented and in no acute distress. There is no icterus. HEENT: EOMs intact. Oral exam negative for thrush or lesions. No cervical lymphadenopathy. CVS: S1S2, regular LUNGS: Bilaterally clear to auscultation, no rhonchi. ABDOMEN: Soft, nontender. No hepatosplenomegaly clinically.  NEURO: grossly nonfocal, cranial nerves are intact. Gait unremarkable. EXTREMITIES: No pedal edema. LYMPHATICS: No palpable adenopathy in axillary or inguinal areas. SKIN: No major bruising or  rash BREASTS: Recent surgery on the right breast, no dominant masses on either side. No axillary adenopathy on either side. Exam performed in presence of a nurse.   LAB RESULTS:    Component Value Date/Time   NA 140 07/16/2011 2130   K 3.7 07/31/2014 0859   K 3.7 07/16/2011 2130   CL 106 07/16/2011 2130   CO2 27 07/16/2011 2130   GLUCOSE 93 07/16/2011 2130   BUN 16 07/16/2011 2130   CREATININE 0.95 07/16/2011 2130   CALCIUM 9.0 07/16/2011 2130   GFRNONAA >60 07/16/2011 2130   GFRAA >60 07/16/2011 2130   Lab Results  Component Value Date   WBC 7.0 07/16/2011   HGB 11.8* 07/16/2011   HCT 35.0 07/16/2011   MCV 94 07/16/2011   PLT 218 07/16/2011  STUDIES: 08/01/14 - Right breast lumpectomy and sentinel node study. Pathology report. DIAGNOSIS:  A. LYMPH NODE, SENTINEL; EXCISION:  - NEGATIVE FOR MALIGNANCY (0/1).  B. BREAST, RIGHT; WIDE EXCISION:  - CARCINOMA WITH MEDULLARY FEATURES.  - ADJACENT BIOPSY SITE CHANGE.  - SEE CANCER STAGING SUMMARY BELOW.  INVASIVE CARCINOMA OF THE BREAST: Complete Excision (Less Than Total  Mastectomy, Including Specimens Designated Biopsy, Lumpectomy,  Quadrantectomy, and Partial Mastectomy With or Without Axillary  Contents) and Mastectomy (Total, Modified Radical, Radical With or  Without Axillary Contents)Radical, Radical With or Without Axillary  Contents)  Breast Invasive Carcinoma Cancer Case Summary  SPECIMEN  Procedure:  Excision without wire-guided localization  Lymph Node Sampling:   Sentinel lymph node(s)  Specimen Laterality:   Right  TUMOR  Presence of Invasive Carcinoma:  Histologic Type of Invasive  Carcinoma:  Invasive medullary carcinoma  Histologic Grade: Nottingham Histologic Score  Glandular (Acinar) / Tubular Differentiation:  Score 3: < 10% of tumor area forming glandular / tubular structures  Nuclear Pleomorphism:  Score 3: Vesicular nuclei, often with prominent nucleoli, exhibiting  marked variation  in size and shape, occasionally with very large and  bizarre forms  Mitotic Rate  Score 3 (>=8 mitoses per mm2)  Overall Grade: Grade 3: scores of 8 or 9  Ductal Carcinoma In Situ (DCIS):  No DCIS is present  EXTENT  Tumor Size / Focality:  Tumor Size: Size of Largest Invasive Carcinoma:  Greatest dimension of largest focus of invasion > 1 mm  Greatest Dimension (mm): 0.5cm  MARGINS  Invasive Carcinoma: Margins uninvolved by invasive carcinoma  Distance from Closest Margin (mm):  Distance (specify in mm) 41m  Ductal Carcinoma In Situ (DCIS):  DCIS not present in specimen  LYMPH NODES  Status of Lymph Nodes:  Total Number of Lymph Nodes Examined:  Specify 1  Micro / Macro Metastases:   Not identified  Number of Lymph Nodes with Isolated Tumor Cells (<= 0.2 mm and <= 200 cells):  None  Number of Sentinel Lymph Nodes Examined:   Specify 1  ACCESSORY FINDINGS  Lymph-Vascular Invasion: Not Identified  STAGE (pTNM)  TNM Descriptors:  not applicable  Primary Tumor (Invasive Carcinoma) (pT):  pT1a: Tumor > 1 mm but <= 5 mm in greatest dimension  Regional Lymph Nodes (pN) (choose a category based on lymph nodes  received with the specimen; immunohistochemistry and / or molecular  studies are not required)  Category (pN): pN0: No regional lymph node metastasis identified  histologically  Distant Metastasis (pM): not applicable  BREAST BIOMARKER TESTS:  Test(s) Performed: Estrogen Receptor (ER) Results: NEGATIVE  Progesterone Receptor (PgR) Results: NEGATIVE  Internal controls present and PgR positive (as expected)  HER2 (ERBB2) by Immunohistochemistry (IHC) Results: Negative (Score 1+). Percentage of Cells with Uniform Intense Complete Membrane Staining: 0% .     ASSESSMENT / PLAN:   1. 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 Surgical pathology reports  tumor size 0.5 cm, grade 3, margins uninvolved by invasive carcinoma. One sentinel lymph node negative. ER negative, PR negative, HER-2/neu negative (1+ on IHC). 2. 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  Tumor size 0.7 cm, grade 3, margins negative (closest margin is 0.8 mm to anterior).  1 sentinel lymph node negative.  ER negative (0 - 5%), PR negative (<1 - 5%)  and HER-2/neu negative by FISH.  -    patient referred here for Oncology evaluation regarding systemic adjuvant treatment. Have reviewed records sent benefiting physician, recent mammograms, surgical pathology report from May 24 and discussed with patient in detail. She had similar sized stage I breast cancer on the left side in 2012. She has now been operated for a second triple negative breast cancer on the opposite side (right side) which is grade 3 histology. Have explained to patient that size of the primary breast tumor is smaller at 0.5 cm and one sentinel lymph node is negative, although this indicates lower risk of systemic recurrence, other concerning features includes triple negative breast cancer and grade 3 histology. It is unclear as to the overall benefit of systemic chemotherapy in this situation. Plan therefore is to obtain Mammaprint testing for further risk stratification, I have explained that if Mammaprint testing returns as high risk then will likely indicate benefit from adjuvant chemotherapy. Patient states that she is worried about side effects of chemotherapy but at the same time she is very concerned that she has developed 2 breast cancers and wants to do anything possible to try and prevent systemic recurrence. Will see her back at 3 weeks by which time Mammaprint  testing should be available and make further plan of management based upon results.  3. Personal history of breast cancer 2, family history of ovarian  cancer - had detailed discussion about her meeting criteria for BRCA testing and discussed in detail regarding benefit of testing and implications. She is agreeable, will pursue this.  4. In between visits, she was advised to call in case of any additional questions or concerns. Patient is agreeable to this plan.   Leia Alf, MD   09/27/2014 6:48 PM

## 2014-10-04 ENCOUNTER — Encounter: Payer: Self-pay | Admitting: Internal Medicine

## 2014-10-05 ENCOUNTER — Other Ambulatory Visit: Payer: Self-pay | Admitting: Unknown Physician Specialty

## 2014-10-05 DIAGNOSIS — K869 Disease of pancreas, unspecified: Secondary | ICD-10-CM

## 2014-10-05 NOTE — Progress Notes (Unsigned)
Please let me know if this order shows up tomorrow

## 2014-10-05 NOTE — Progress Notes (Signed)
ok 

## 2014-10-09 ENCOUNTER — Ambulatory Visit
Admission: RE | Admit: 2014-10-09 | Discharge: 2014-10-09 | Disposition: A | Discharge status: Home / Self Care | Payer: 59 | Source: Ambulatory Visit | Attending: Radiation Oncology | Admitting: Radiation Oncology

## 2014-10-09 ENCOUNTER — Encounter: Payer: Self-pay | Admitting: Radiation Oncology

## 2014-10-09 VITALS — BP 138/86 | HR 84 | Temp 98.3°F | Resp 20 | Wt 202.3 lb

## 2014-10-09 DIAGNOSIS — C50411 Malignant neoplasm of upper-outer quadrant of right female breast: Secondary | ICD-10-CM

## 2014-10-09 NOTE — Progress Notes (Signed)
Radiation Oncology Follow up Note  Name: Adrienne [name]   Date:   10/09/2014 MRN:  [id number] DOB: 07-22-1960    This 54 y.o. female presents to the clinic today for Patient is a 54 year old female well known to our department having been treated for years prior for early stage breast cancer of the left breast status post accelerated partial breast irradiation through the MammoSite catheter. Recently presented with an abnormal mammogram of the right breast showing a 6 mm area at 11:00 position 9 cm from the nipple confirm is a hypoechoic mass. Clip was placed and biopsy was performed showing invasive mammary carcinoma. 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 Margin of resection was 2 mm. One sentinel lymph node was negative for metastatic disease to him was overall score of 3She had a MammoSite catheter placed and underwent accelerated partial breast radiation. She is now 1 month out and doing well. She specifically denies breast tenderness cough or bone pain. She is triple negative she was not on aromatase inhibitor.Marland Kitchen  REFERRING PROVIDER: Guadalupe Maple, MD  HPI: as above.  COMPLICATIONS OF TREATMENT: none  FOLLOW UP COMPLIANCE: keeps appointments   PHYSICAL EXAM:  BP 138/86 mmHg  Pulse 84  Temp(Src) 98.3 F (36.8 C)  Resp 20  Wt 202 lb 4.4 oz (91.75 kg) Lungs are clear to A&P cardiac examination essentially unremarkable with regular rate and rhythm. No dominant mass or nodularity is noted in either breast in 2 positions examined. Incision is well-healed. No axillary or supraclavicular adenopathy is appreciated. Cosmetic result is excellent. Well-developed well-nourished patient in NAD. HEENT reveals PERLA, EOMI, discs not visualized.  Oral cavity is clear. No oral mucosal lesions are identified. Neck is clear without evidence of cervical or supraclavicular adenopathy. Lungs are clear to A&P. Cardiac examination is  essentially unremarkable with regular rate and rhythm without murmur rub or thrill. Abdomen is benign with no organomegaly or masses noted. Motor sensory and DTR levels are equal and symmetric in the upper and lower extremities. Cranial nerves II through XII are grossly intact. Proprioception is intact. No peripheral adenopathy or edema is identified. No motor or sensory levels are noted. Crude visual fields are within normal range.   RADIOLOGY RESULTS: no recent films for review  PLAN: at the present time she is doing well 1 month out from accelerated partial breast radiation. She has an appointment with medical oncology next week. Not a candidate for aromatase inhibitor or tamoxifen based on her triple negative status. I have asked to see her back in 4-5 months for follow-up. She knows to call sooner with any concerns.  I would like to take this opportunity for allowing me to participate in the care of your patient.Armstead Peaks., MD

## 2014-10-16 ENCOUNTER — Encounter: Payer: Self-pay | Admitting: *Deleted

## 2014-10-16 ENCOUNTER — Inpatient Hospital Stay: Payer: 59 | Attending: Internal Medicine | Admitting: Internal Medicine

## 2014-10-16 VITALS — BP 130/80 | Temp 97.8°F | Resp 18 | Ht 65.0 in | Wt 200.2 lb

## 2014-10-16 DIAGNOSIS — Z171 Estrogen receptor negative status [ER-]: Secondary | ICD-10-CM

## 2014-10-16 DIAGNOSIS — E669 Obesity, unspecified: Secondary | ICD-10-CM | POA: Insufficient documentation

## 2014-10-16 DIAGNOSIS — I10 Essential (primary) hypertension: Secondary | ICD-10-CM | POA: Insufficient documentation

## 2014-10-16 DIAGNOSIS — Z79899 Other long term (current) drug therapy: Secondary | ICD-10-CM | POA: Diagnosis not present

## 2014-10-16 DIAGNOSIS — Z853 Personal history of malignant neoplasm of breast: Secondary | ICD-10-CM | POA: Diagnosis not present

## 2014-10-16 DIAGNOSIS — K219 Gastro-esophageal reflux disease without esophagitis: Secondary | ICD-10-CM | POA: Insufficient documentation

## 2014-10-16 DIAGNOSIS — C50911 Malignant neoplasm of unspecified site of right female breast: Secondary | ICD-10-CM

## 2014-10-16 DIAGNOSIS — J45909 Unspecified asthma, uncomplicated: Secondary | ICD-10-CM | POA: Insufficient documentation

## 2014-10-16 NOTE — Progress Notes (Signed)
Met patient and her boyfriend today while getting her Mammoprint results.  Dr. Ma Hillock has recommended chemotherapy based on the results.  Patient is to consider treatment and call him back to schedule a follow-up appointment for treatment planning.  She is to call if she has any questions or needs.

## 2014-10-20 ENCOUNTER — Telehealth: Payer: Self-pay | Admitting: General Surgery

## 2014-10-20 NOTE — Telephone Encounter (Signed)
Results of Mammoprint 70 gene analysis completed by Dr. Ma Hillock and shows the patient at high risk for recurrence. She will likely benefit from adjuvant chemotherapy. A message was left to encouraged her to rethink her reluctance in this regard. She was encouraged to call the office if she thought that would be of help.

## 2014-10-21 NOTE — Progress Notes (Signed)
[facility]  [phone] [phone]     ID: Adrienne [name] OB: 06/25/1960  MR#: [id number]  CSN#:[id number]  Patient Care Team: Mark A Crissman, MD as PCP - General (Unknown Physician Specialty) Jeffrey W Byrnett, MD (General Surgery) Mark A Crissman, MD (Family Medicine)  CHIEF COMPLAINT/DIAGNOSIS:  1. 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 Surgical pathology reports tumor size 0.5 cm, grade 3, margins uninvolved by invasive carcinoma. One sentinel lymph node negative. ER negative, PR negative, HER-2/neu negative (1+ on IHC)  -  patient referred here for Oncology evaluation regarding systemic adjuvant treatment.  2. 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  Tumor size 0.7 cm, grade 3, margins negative (closest margin is 0.8 mm to anterior).  1 sentinel lymph node negative.  ER negative (0 - 5%), PR negative (<1 - 5%) and HER-2/neu negative by FISH.  08/01/14 - Mammaprint Recurrence Assay - High Risk (Molecular Subtype is Basal-Type) score of -0.814 (5 year risk is 22%, 10 year risk is 29%).  HISTORY OF PRESENT ILLNESS:  Patient returns for follow-up, she had mammaprint assay done which is showing high risk for breast cancer recurrence. Clinically states that she is doing study and denies new complaints. Eating well. Remains physically active and ambulatory. No new cough, dyspnea or orthopnea. No new bone pains.  REVIEW OF SYSTEMS:   ROS As in HPI above. In addition, no fever, chills. No new headaches or focal weakness.  No abdominal pain, constipation, diarrhea, dysuria or hematuria. No new bone pain. No skin rash or bleeding symptoms. No new paresthesias in extremities.  PS ECOG 0.  PAST MEDICAL HISTORY: Reviewed. Past Medical History    Diagnosis Date  . Seasonal allergies   . Unspecified essential hypertension   . Asthma   . 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 Dry eyes 2012  . Breast screening, unspecified   . Lump or mass in breast   . Special screening for malignant neoplasms, colon   . Obesity, unspecified   . Screening for obesity   . Breast cyst 2010    cyst- drained  . Constipation   . Kidney stone   . GERD (gastroesophageal reflux disease)   . Anemia   . Malignant neoplasm of breast (female), unspecified site 07/08/2014    Right breast, 5 mm, T1a,N0, triple negative  . Malignant neoplasm of upper-outer quadrant of female breast 05/08/2010    Left breast: T1b, N0, M); triple negative, DCIS present. No chemotherapy. MammoSite radiation.  . Breast cancer     PAST SURGICAL HISTORY: Reviewed. Past Surgical History  Procedure Laterality Date  . Cesarean section  1988  . Colonoscopy  2011    Dr. Iftikhar  . Breast mammosite  April 2012    mammosite placement and removal   . Kidney stone surgery  2003    stones removed by Dr. Daniel- stent placed/removed  . Tubal ligation    . Breast lumpectomy with axillary lymph node biopsy Right 08/01/2014    Procedure: BREAST LUMPECTOMY WITH AXILLARY LYMPH NODE BIOPSY;  Surgeon: Jeffrey W Byrnett, MD;  Location: [facility] ORS;    Service: General;  Laterality: Right;  . Sentinel node biopsy Right 08/01/2014    Procedure: SENTINEL NODE BIOPSY;  Surgeon: Jeffrey W Byrnett, MD;  Location: [facility] ORS;  Service: General;  Laterality: Right;  . Breast surgery Left 2012    left breast wide excision  . Breast cyst aspiration  2010  . Breast surgery Right 08/06/2014    Wide excision/sentinel node biopsy, T1a, N0 triple negative    FAMILY HISTORY: Reviewed. Family History  Problem Relation  Age of Onset  . Ovarian cancer Mother     SOCIAL HISTORY: Reviewed. Social History  Substance Use Topics  . Smoking status: Never Smoker   . Smokeless tobacco: Never Used  . Alcohol Use: Yes     Comment: occasionally    Allergies  Allergen Reactions  . Shellfish Allergy Other (See Comments)    Hives and swelling on ears and feet  . Tape Rash    Surgical tape from breast biopsy    Current Outpatient Prescriptions  Medication Sig Dispense Refill  . ALBUTEROL SULFATE HFA IN Inhale 2 puffs into the lungs as needed.    . aspirin 81 MG tablet Take 81 mg by mouth daily.    . b complex vitamins tablet Take 1 tablet by mouth daily.    . Cholecalciferol (VITAMIN D3) 1000 UNITS CAPS Take by mouth daily.    . esomeprazole (NEXIUM) 40 MG capsule Take 40 mg by mouth 2 (two) times daily as needed.    . fexofenadine (ALLEGRA) 180 MG tablet Take 180 mg by mouth daily as needed.     . hydrochlorothiazide (HYDRODIURIL) 25 MG tablet Take 12.5 mg by mouth daily.     . HYDROcodone-acetaminophen (NORCO) 5-325 MG per tablet Take 1-2 tablets by mouth every 4 (four) hours as needed for moderate pain or severe pain. 30 tablet 0  . POTASSIUM PO Take 1,000 Units by mouth daily.     No current facility-administered medications for this visit.    PHYSICAL EXAM: Filed Vitals:   10/16/14 1613  BP: 130/80  Temp:   Resp:      Body mass index is 33.31 kg/(m^2).    ECOG FS:0 - Asymptomatic  GENERAL: Patient is alert and oriented and in no acute distress. There is no icterus. HEENT: EOMs intact. Oral exam negative for thrush or lesions. No cervical lymphadenopathy. CVS: S1S2, regular LUNGS: Bilaterally clear to auscultation, no rhonchi. ABDOMEN: Soft, nontender. No hepatomegaly  EXTREMITIES: No pedal edema.  LAB RESULTS:    Component Value Date/Time   NA 140 07/16/2011 2130   K 3.7 07/31/2014 0859   K 3.7 07/16/2011 2130   CL 106 07/16/2011 2130   CO2 27 07/16/2011 2130   GLUCOSE 93 07/16/2011  2130   BUN 16 07/16/2011 2130   CREATININE 0.95 07/16/2011 2130   CALCIUM 9.0 07/16/2011 2130   GFRNONAA >60 07/16/2011 2130   GFRAA >60 07/16/2011 2130   Lab Results  Component Value Date   WBC 7.0 07/16/2011   HGB 11.8* 07/16/2011   HCT 35.0 07/16/2011   MCV 94 07/16/2011   PLT 218 07/16/2011     STUDIES: 08/01/14 - Right breast lumpectomy and sentinel node study. Pathology report. DIAGNOSIS:  A. LYMPH NODE, SENTINEL; EXCISION:  - NEGATIVE FOR MALIGNANCY (0/1).  B. BREAST, RIGHT; WIDE EXCISION:  - CARCINOMA WITH MEDULLARY FEATURES.  - ADJACENT BIOPSY SITE CHANGE.  - SEE CANCER STAGING SUMMARY BELOW.  INVASIVE CARCINOMA OF THE BREAST: Complete Excision (Less Than Total  Mastectomy, Including   Specimens Designated Biopsy, Lumpectomy,  Quadrantectomy, and Partial Mastectomy With or Without Axillary  Contents) and Mastectomy (Total, Modified Radical, Radical With or  Without Axillary Contents)Radical, Radical With or Without Axillary  Contents)  Breast Invasive Carcinoma Cancer Case Summary  SPECIMEN  Procedure:  Excision without wire-guided localization  Lymph Node Sampling:   Sentinel lymph node(s)  Specimen Laterality:   Right  TUMOR  Presence of Invasive Carcinoma:  Histologic Type of Invasive  Carcinoma:  Invasive medullary carcinoma  Histologic Grade: Nottingham Histologic Score  Glandular (Acinar) / Tubular Differentiation:  Score 3: < 10% of tumor area forming glandular / tubular structures  Nuclear Pleomorphism:  Score 3: Vesicular nuclei, often with prominent nucleoli, exhibiting  marked variation in size and shape, occasionally with very large and  bizarre forms  Mitotic Rate  Score 3 (>=8 mitoses per mm2)  Overall Grade: Grade 3: scores of 8 or 9  Ductal Carcinoma In Situ (DCIS):  No DCIS is present  EXTENT  Tumor Size / Focality:  Tumor Size: Size of Largest Invasive Carcinoma:  Greatest dimension of largest focus of invasion > 1 mm  Greatest  Dimension (mm): 0.5cm  MARGINS  Invasive Carcinoma: Margins uninvolved by invasive carcinoma  Distance from Closest Margin (mm):  Distance (specify in mm) 34m  Ductal Carcinoma In Situ (DCIS):  DCIS not present in specimen  LYMPH NODES  Status of Lymph Nodes:  Total Number of Lymph Nodes Examined:  Specify 1  Micro / Macro Metastases:   Not identified  Number of Lymph Nodes with Isolated Tumor Cells (<= 0.2 mm and <= 200 cells):  None  Number of Sentinel Lymph Nodes Examined:   Specify 1  ACCESSORY FINDINGS  Lymph-Vascular Invasion: Not Identified  STAGE (pTNM)  TNM Descriptors:  not applicable  Primary Tumor (Invasive Carcinoma) (pT):  pT1a: Tumor > 1 mm but <= 5 mm in greatest dimension  Regional Lymph Nodes (pN) (choose a category based on lymph nodes  received with the specimen; immunohistochemistry and / or molecular  studies are not required)  Category (pN): pN0: No regional lymph node metastasis identified  histologically  Distant Metastasis (pM): not applicable  BREAST BIOMARKER TESTS:  Test(s) Performed: Estrogen Receptor (ER) Results: NEGATIVE  Progesterone Receptor (PgR) Results: NEGATIVE  Internal controls present and PgR positive (as expected)  HER2 (ERBB2) by Immunohistochemistry (IHC) Results: Negative (Score 1+). Percentage of Cells with Uniform Intense Complete Membrane Staining: 0% .     ASSESSMENT / PLAN:   1. 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 Surgical pathology reports tumor size 0.5 cm, grade 3, margins uninvolved by invasive carcinoma. One sentinel lymph node negative. ER negative, PR negative, HER-2/neu negative (1+ on IHC). 2. 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  Tumor size 0.7 cm, grade 3, margins negative (closest  margin is 0.8 mm to anterior).  1 sentinel lymph node negative.  ER negative (0 - 5%), PR negative (<1 - 5%) and HER-2/neu negative by FISH.  08/01/14 - Mammaprint Recurrence Assay - High Risk (Molecular Subtype is Basal-Type) score of -0.814 (5 year risk is 22%, 10 year risk is 29%).  -    Have discussed with patient regarding mammaprint assay done which is showing high risk for breast cancer recurrence, and reported benefit of pursuing adjuvant chemotherapy (based on the assay  it reports about 12% improvement in distant metastasis free survival at 5 years), also explained that she is not a candidate for hormonal therapy since her tumor is ER/PR negative. Have recommended pursuing standard adjuvant chemotherapy with Adriamycin/Cytoxan every 3 weeks 4 cycles, followed by weekly Taxol 12 doses. Have also explained rationale and possible side effects of above chemotherapy regimen. Patient is hesitant to pursue chemotherapy and wants to think about this and discussed with family. We will see her back next week and make further plan of management based upon her decision.  3. Personal history of breast cancer 2, family history of ovarian cancer - BRCA test result is pending.  4. In between visits, she was advised to call in case of any additional questions or concerns. Patient is agreeable to this plan.   Sandeep Pandit, MD   10/21/2014 9:01 AM      

## 2014-10-23 ENCOUNTER — Telehealth: Payer: Self-pay | Admitting: *Deleted

## 2014-10-23 NOTE — Telephone Encounter (Signed)
Patient called the office back to report that she is considering chemo due to recent Mammoprint results.   This patient states that she will be in contact with the [facility].   If she decides to have a port placement, she does want this completed through our office.

## 2014-10-24 ENCOUNTER — Telehealth: Payer: Self-pay | Admitting: *Deleted

## 2014-10-24 NOTE — Telephone Encounter (Signed)
Pt called to see if her other test results back yet.  BRCA 1 and 2 were no clinically significant mutation identified.  Pt has decided to take treatment and I told her that I will let Ma Hillock know that we can start getting all things needed to start treatment. She is agreeable.

## 2014-10-25 ENCOUNTER — Telehealth: Payer: Self-pay | Admitting: General Surgery

## 2014-10-25 ENCOUNTER — Other Ambulatory Visit: Payer: Self-pay | Admitting: *Deleted

## 2014-10-25 DIAGNOSIS — Z01818 Encounter for other preprocedural examination: Secondary | ICD-10-CM

## 2014-10-25 DIAGNOSIS — C50911 Malignant neoplasm of unspecified site of right female breast: Secondary | ICD-10-CM

## 2014-10-25 NOTE — Telephone Encounter (Signed)
SHERRY WITH [facility](DR [phone]))CALLED AND STATES THE PT HAS DECIDED TO PROCEED WITH CHEMO.SHE WILL NEED A PORT &  WILL SEND THE ORDER.WOULD YOU NEED TO SEE HER BEFORE PROCEDURE SCHEDULED? SHERRY SAID SHE WILL BE IN [place] IN THE AM & IN [place] IN THE PM,LEAVE A MESSAGE.

## 2014-10-30 ENCOUNTER — Encounter: Payer: Self-pay | Admitting: General Surgery

## 2014-10-30 ENCOUNTER — Ambulatory Visit (INDEPENDENT_AMBULATORY_CARE_PROVIDER_SITE_OTHER): Payer: 59 | Admitting: General Surgery

## 2014-10-30 VITALS — BP 132/76 | HR 72 | Resp 14 | Ht 65.0 in | Wt 199.0 lb

## 2014-10-30 DIAGNOSIS — C50911 Malignant neoplasm of unspecified site of right female breast: Secondary | ICD-10-CM

## 2014-10-30 NOTE — Progress Notes (Signed)
Patient ID: Adrienne [name], female   DOB: [birth year], 54 y.o.   MRN: [id number]  Chief Complaint  Patient presents with  . Pre-op Exam    port placment    HPI Adrienne [name] is a 54 y.o. female here today for a pre-op exam for her port placement. She does complain of having a cold and cough for about 3 weeks. She states she has an appointment on 11/01/14 for a cardio muga test. HPI  Past Medical History  Diagnosis Date  . Seasonal allergies   . Unspecified essential hypertension   . Asthma   . 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 Dry eyes 2012  . Breast screening, unspecified   . Lump or mass in breast   . Special screening for malignant neoplasms, colon   . Obesity, unspecified   . Screening for obesity   . Breast cyst 2010    cyst- drained  . Constipation   . Kidney stone   . GERD (gastroesophageal reflux disease)   . Anemia   . Malignant neoplasm of breast (female), unspecified site 07/08/2014    Right breast, 5 mm, T1a,N0, triple negative; high risk for recurrence on Mammoprint.  . Malignant neoplasm of upper-outer quadrant of female breast 05/08/2010    Left breast: T1b, N0, M); triple negative, DCIS present. No chemotherapy. MammoSite radiation.  . Breast cancer     Past Surgical History  Procedure Laterality Date  . Cesarean section  1988  . Colonoscopy  2011    Dr. Dionne Milo  . Breast mammosite  April 2012    mammosite placement and removal   . Kidney stone surgery  2003    stones removed by Dr. Quillian Quince- stent placed/removed  . Tubal ligation    . Breast lumpectomy with axillary lymph node biopsy Right 08/01/2014    Procedure: BREAST LUMPECTOMY WITH AXILLARY LYMPH NODE BIOPSY;  Surgeon: Robert Bellow, MD;  Location: [facility] ORS;  Service: General;  Laterality: Right;  .  Sentinel node biopsy Right 08/01/2014    Procedure: SENTINEL NODE BIOPSY;  Surgeon: Robert Bellow, MD;  Location: [facility] ORS;  Service: General;  Laterality: Right;  . Breast surgery Left 2012    left breast wide excision, sentinel node, MammoSite  . Breast cyst aspiration  2010  . Breast surgery Right 08/06/2014    Wide excision/sentinel node biopsy, T1a, N0 triple negative, MammoSite    Family History  Problem Relation Age of Onset  . Ovarian cancer Mother     Social History Social History  Substance Use Topics  . Smoking status: Never Smoker   . Smokeless tobacco: Never Used  . Alcohol Use: Yes     Comment: occasionally    Allergies  Allergen Reactions  . Shellfish Allergy Other (See Comments)    Hives and swelling on ears and feet  . Tape Rash    Surgical tape from breast biopsy    Current Outpatient Prescriptions  Medication Sig Dispense Refill  . ALBUTEROL SULFATE HFA IN Inhale 2 puffs into the lungs as needed.    Marland Kitchen aspirin 81 MG tablet Take 81 mg by mouth daily.    Marland Kitchen b complex vitamins tablet Take 1 tablet by mouth daily.    . Cholecalciferol (VITAMIN D3) 1000 UNITS CAPS Take by mouth daily.    Marland Kitchen  esomeprazole (NEXIUM) 40 MG capsule Take 40 mg by mouth 2 (two) times daily as needed.    . fexofenadine (ALLEGRA) 180 MG tablet Take 180 mg by mouth daily as needed.     . hydrochlorothiazide (HYDRODIURIL) 25 MG tablet Take 12.5 mg by mouth daily.     Marland Kitchen HYDROcodone-acetaminophen (NORCO) 5-325 MG per tablet Take 1-2 tablets by mouth every 4 (four) hours as needed for moderate pain or severe pain. 30 tablet 0  . POTASSIUM PO Take 1,000 Units by mouth daily.     No current facility-administered medications for this visit.    Review of Systems Review of Systems  Constitutional: Positive for fatigue.  Respiratory: Positive for cough.   Cardiovascular: Negative.     Blood pressure 132/76, pulse 72, resp. rate 14, height 5\' 5"  (1.651 m), weight 199 lb (90.266  kg).  Physical Exam Physical Exam  Constitutional: She is oriented to person, place, and time. She appears well-developed and well-nourished.  Eyes: Conjunctivae are normal. No scleral icterus.  Neck: No thyromegaly present.  Cardiovascular: Normal rate, regular rhythm and normal heart sounds.   Pulmonary/Chest: Effort normal and breath sounds normal. Right breast exhibits no inverted nipple, no mass, no nipple discharge, no skin change and no tenderness. Left breast exhibits no inverted nipple, no mass, no nipple discharge, no skin change and no tenderness.    Thickening in upper outer quadrant in left breast Right breast well healed  Lymphadenopathy:    She has no cervical adenopathy.    She has no axillary adenopathy.  Neurological: She is alert and oriented to person, place, and time.  Skin: Skin is warm and dry.  Psychiatric: Her behavior is normal.    Data Reviewed   At the time of the patient's 09/14/2014 visit I was optimistic that adjuvant treatment would not be required based on the small tumor side. Subsequent analysis proved this to be untrue.  Mammoprint analysis shows the patient had a 22% risk of recurrence without adjuvant treatment.  Assessment    Doing well status post wide excision, sentinel node biopsy, partial breast radiation.  Candidate for adjuvant chemotherapy.    Plan    The role of having central venous access for adjuvant chemotherapy was reviewed. We will use the left side as she is now 4 years out from her original surgery there.     The risks associated with central venous access including arterial, pulmonary and venous injury were reviewed. The possible need for additional treatment if pulmonary injury occurs (chest tube placement) was discussed.  Schedule port placement.  Patient's surgery has been scheduled for 11-15-14 at [facility]. It is okay for patient to continue 81 mg aspirin once daily.   PCP: Dr. Dawayne Patricia, [place] 10/30/2014, 11:14 AM

## 2014-10-30 NOTE — H&P (Signed)
Patient ID: Adrienne [name], female DOB: [birth year], 54 y.o. MRN: [id number]  Chief Complaint   Patient presents with   .  Pre-op Exam     port placment    HPI  Adrienne [name] is a 54 y.o. female here today for a pre-op exam for her port placement. She does complain of having a cold and cough for about 3 weeks. She states she has an appointment on 11/01/14 for a cardio muga test.  HPI  Past Medical History   Diagnosis  Date   .  Seasonal allergies    .  Unspecified essential hypertension    .  Asthma    .  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  Dry eyes  2012   .  Breast screening, unspecified    .  Lump or mass in breast    .  Special screening for malignant neoplasms, colon    .  Obesity, unspecified    .  Screening for obesity    .  Breast cyst  2010     cyst- drained   .  Constipation    .  Kidney stone    .  GERD (gastroesophageal reflux disease)    .  Anemia    .  Malignant neoplasm of breast (female), unspecified site  07/08/2014     Right breast, 5 mm, T1a,N0, triple negative; high risk for recurrence on Mammoprint.   .  Malignant neoplasm of upper-outer quadrant of female breast  05/08/2010     Left breast: T1b, N0, M); triple negative, DCIS present. No chemotherapy. MammoSite radiation.   .  Breast cancer     Past Surgical History   Procedure  Laterality  Date   .  Cesarean section   1988   .  Colonoscopy   2011     Dr. Dionne Milo   .  Breast mammosite   April 2012     mammosite placement and removal   .  Kidney stone surgery   2003     stones removed by Dr. Quillian Quince- stent placed/removed   .  Tubal ligation     .  Breast lumpectomy with axillary lymph node biopsy  Right  08/01/2014     Procedure: BREAST LUMPECTOMY WITH AXILLARY LYMPH NODE BIOPSY; Surgeon: Robert Bellow,  MD; Location: [facility] ORS; Service: General; Laterality: Right;   .  Sentinel node biopsy  Right  08/01/2014     Procedure: SENTINEL NODE BIOPSY; Surgeon: Robert Bellow, MD; Location: [facility] ORS; Service: General; Laterality: Right;   .  Breast surgery  Left  2012     left breast wide excision, sentinel node, MammoSite   .  Breast cyst aspiration   2010   .  Breast surgery  Right  08/06/2014     Wide excision/sentinel node biopsy, T1a, N0 triple negative, MammoSite    Family History   Problem  Relation  Age of Onset   .  Ovarian cancer  Mother     Social History  Social History   Substance Use Topics   .  Smoking status:  Never Smoker   .  Smokeless tobacco:  Never Used   .  Alcohol Use:  Yes      Comment: occasionally  Allergies   Allergen  Reactions   .  Shellfish Allergy  Other (See Comments)     Hives and swelling on ears and feet   .  Tape  Rash     Surgical tape from breast biopsy    Current Outpatient Prescriptions   Medication  Sig  Dispense  Refill   .  ALBUTEROL SULFATE HFA IN  Inhale 2 puffs into the lungs as needed.     Marland Kitchen  aspirin 81 MG tablet  Take 81 mg by mouth daily.     Marland Kitchen  b complex vitamins tablet  Take 1 tablet by mouth daily.     .  Cholecalciferol (VITAMIN D3) 1000 UNITS CAPS  Take by mouth daily.     Marland Kitchen  esomeprazole (NEXIUM) 40 MG capsule  Take 40 mg by mouth 2 (two) times daily as needed.     .  fexofenadine (ALLEGRA) 180 MG tablet  Take 180 mg by mouth daily as needed.     .  hydrochlorothiazide (HYDRODIURIL) 25 MG tablet  Take 12.5 mg by mouth daily.     Marland Kitchen  HYDROcodone-acetaminophen (NORCO) 5-325 MG per tablet  Take 1-2 tablets by mouth every 4 (four) hours as needed for moderate pain or severe pain.  30 tablet  0   .  POTASSIUM PO  Take 1,000 Units by mouth daily.      No current facility-administered medications for this visit.    Review of Systems  Review of Systems  Constitutional: Positive for fatigue.  Respiratory: Positive for cough.    Cardiovascular: Negative.   Blood pressure 132/76, pulse 72, resp. rate 14, height 5\' 5"  (1.651 m), weight 199 lb (90.266 kg).  Physical Exam  Physical Exam  Constitutional: She is oriented to person, place, and time. She appears well-developed and well-nourished.  Eyes: Conjunctivae are normal. No scleral icterus.  Neck: No thyromegaly present.  Cardiovascular: Normal rate, regular rhythm and normal heart sounds.  Pulmonary/Chest: Effort normal and breath sounds normal. Right breast exhibits no inverted nipple, no mass, no nipple discharge, no skin change and no tenderness. Left breast exhibits no inverted nipple, no mass, no nipple discharge, no skin change and no tenderness.    Thickening in upper outer quadrant in left breast Right breast well healed  Lymphadenopathy:  She has no cervical adenopathy.  She has no axillary adenopathy.  Neurological: She is alert and oriented to person, place, and time.  Skin: Skin is warm and dry.  Psychiatric: Her behavior is normal.   Data Reviewed  At the time of the patient's 09/14/2014 visit I was optimistic that adjuvant treatment would not be required based on the small tumor side. Subsequent analysis proved this to be untrue.  Mammoprint analysis shows the patient had a 22% risk of recurrence without adjuvant treatment.  Assessment   Doing well status post wide excision, sentinel node biopsy, partial breast radiation.  Candidate for adjuvant chemotherapy.   Plan   The role of having central venous access for adjuvant chemotherapy was reviewed. We will use the left side as she is now 4 years out from her original surgery there.   The risks associated with central venous access including arterial, pulmonary and venous injury were reviewed. The possible need for additional treatment if pulmonary injury occurs (chest tube placement) was discussed.  Schedule port placement.  Patient's surgery has been scheduled for 11-15-14 at [facility]. It is okay  for patient to continue 81 mg aspirin once daily.  PCP:  Dr. Dawayne Patricia, [place] [facility]  10/30/2014, 11:14 AM

## 2014-10-30 NOTE — Patient Instructions (Addendum)
Central Lines A central line is a soft, flexible tube (catheter) that is used to give medicine or nutrition through a person's veins. The tip of the central line ends in a large vein (superior vena cava) just above the person's heart. Medicine given through the central line is quickly mixed with blood because the blood flow within this large vein is so great. This dilutes the medicine so it is swiftly delivered throughout the body. Insertion of any type of central line is a sterile procedure.  A central line may be placed because:   You need medicine that would be irritating to the small veins in your hands or arms.   You need long-term IV medicines, such as antibiotics.   You need nutrition delivered through a central line.   You have veins in your hands or arms that are difficult to access.   You need frequent blood draws for lab tests.   You need dialysis. TYPES OF CENTRAL LINES There are different types of central lines. The type of central line you receive depends on how long it will be needed, your medical condition, and the condition of your veins. You might receive any of these types:   Peripherally inserted central catheter (PICC) lines. These are usually inserted in the upper arm. They are used for intermediate to long-term access. These lines are often inserted by a specially trained nurse using ultrasonography to guide the placement.  Non-tunneled central lines. These are inserted in the neck, chest, or groin. They are used for short-term access, usually a maximum of 7 days. These lines are often used in the emergency department, and they can have a high infection rate.  Tunneled central lines. These are normally inserted in the upper chest area. They are used for long-term therapy and may be used for dialysis. These lines are inserted and removed surgically.  Implanted ports. These are normally inserted in the upper chest but can also be placed in the upper arm or in the  stomach area (abdomen). They are used for long-term therapy. Ports are inserted and removed surgically. The person's skin heals over the port insertion site. The port must be accessed using a special needle. RISKS AND COMPLICATIONS Any type of central line has risks that you should be aware of. Some of these include:   Infection.   Clotting of the central line.   Bleeding from the insertion site of the central line.   Developing a hole or crack within the central line. If this happens, the central line will need to be replaced.   Developing an abnormal heart rhythm (rare). The heart may beat rapidly or "skip" beats.   A collapsed lung during insertion (rare). HOME CARE INSTRUCTIONS   Follow your health care provider's specific instructions for the type of device that you have.  Keep any bandages (dressings) over the central lineclean and dry. Wash your hands before a dressing change, and change dressings as directed by your health care provider. Look for redness or irritation at the insertion site during a dressing change.  Keep the insertion site of your central line clean at all times.   Wash your hands and clean the central line hub with rubbing alcohol before you flush it.   If the central line accidentally gets pulled on, make sure that the dressing is okay, that there is no bleeding, and that the line has not been pulled out.   Limit lifting, using your arm, or performing other activity as directed by  your health care provider.  Swim or bathe only if your health care provider approves. Your health care provider can instruct you on how to keep your specific type of dressing from getting wet. SEEK IMMEDIATE MEDICAL CARE IF:   You have chills.  You have a fever.   You have shortness of breath or difficulty breathing.   You have chest pain.   You feel your heart beating rapidly or "skipping" beats.   You feel dizzy or faint.  You have bleeding from the  insertion site that does not stop.   You have pain, redness, swelling, or drainage at the insertion site of the central line.   You have swelling in your neck, face, chest, or arm on the side of your central line.   Your central line is difficult to flush or will not flush.  You do not get a blood return from the central line.  You have a hole or tear in the catheter.   Your catheter leaks when flushed or when fluids are infused into it. MAKE SURE YOU:   Understand these instructions.  Will watch your condition.  Will get help right away if you are not doing well or get worse. Document Released: 04/17/2005 Document Revised: 03/01/2013 Document Reviewed: 12/04/2011 [facility] Patient Information 2015 [place], [place]. This information is not intended to replace advice given to you by your health care provider. Make sure you discuss any questions you have with your health care provider.   Patient's surgery has been scheduled for 11-15-14 at [facility]. It is okay for patient to continue 81 mg aspirin once daily.

## 2014-10-31 ENCOUNTER — Telehealth: Payer: Self-pay | Admitting: *Deleted

## 2014-10-31 ENCOUNTER — Telehealth: Payer: Self-pay

## 2014-10-31 MED ORDER — LIDOCAINE-PRILOCAINE 2.5-2.5 % EX CREA: TOPICAL_CREAM | CUTANEOUS | Status: DC

## 2014-10-31 NOTE — Telephone Encounter (Signed)
Spoke with pt to change appts.. Pt had surgery 5/25 and would like to start treatment within 12 weeks of surgery and instead of getting port placed 9/7 and start treatment 9/9 we would like to move appt up.  Pt is agreeable and I worked with Hoyle Sauer at [place] office and she said they can change preop telephone call to 8/26, change port insertion to 9/1 and our office can change her chemo teaching to 8/30 and start chemo to 9/2.  Pt given all dates and times and is agreeable to plan.  I have spoke to pt that when she gets treatment she will get port accessed and went over the emla cream for numbing and it has been electronically sent to her pharmacy.  And that she can have labs drawn from port on the days she gets treatment but the lab only days she will have to get blood drawn out of arm. She understands and is agreeable to above.

## 2014-10-31 NOTE — Telephone Encounter (Signed)
Patient called and after speaking with Judeen Hammans at the [facility] she has decided to move up the date for her port placement scheduled for 11/15/14. The patient is now scheduled for surgery at [facility] on 11/09/14. She will pre admit by phone on 11/03/14. Patient is aware of dates and instructions.

## 2014-11-01 ENCOUNTER — Ambulatory Visit
Admission: RE | Admit: 2014-11-01 | Discharge: 2014-11-01 | Disposition: A | Discharge status: Home / Self Care | Payer: 59 | Source: Ambulatory Visit | Attending: Internal Medicine | Admitting: Internal Medicine

## 2014-11-01 DIAGNOSIS — C50911 Malignant neoplasm of unspecified site of right female breast: Secondary | ICD-10-CM | POA: Diagnosis present

## 2014-11-01 DIAGNOSIS — Z01818 Encounter for other preprocedural examination: Secondary | ICD-10-CM

## 2014-11-01 MED ORDER — TECHNETIUM TC 99M-LABELED RED BLOOD CELLS IV KIT
19.9000 | PACK | Freq: Once | INTRAVENOUS | Status: AC | PRN
  Administered 2014-11-01: 19.9 via INTRAVENOUS

## 2014-11-02 NOTE — Progress Notes (Signed)
Patient contacted insurance company to consult with case manager regarding depression. Patient also met with Nurse navigator and told to contact us if any concerns or problems arise.

## 2014-11-02 NOTE — Patient Instructions (Signed)
Doxorubicin injection What is this medicine? DOXORUBICIN (dox oh ROO bi sin) is a chemotherapy drug. It is used to treat many kinds of cancer like Hodgkin's disease, leukemia, non-Hodgkin's lymphoma, neuroblastoma, sarcoma, and Wilms' tumor. It is also used to treat bladder cancer, breast cancer, lung cancer, ovarian cancer, stomach cancer, and thyroid cancer. This medicine may be used for other purposes; ask your health care provider or pharmacist if you have questions. COMMON BRAND NAME(S): Adriamycin, Adriamycin PFS, Adriamycin RDF, Rubex What should I tell my health care provider before I take this medicine? They need to know if you have any of these conditions: -blood disorders -heart disease, recent heart attack -infection (especially a virus infection such as chickenpox, cold sores, or herpes) -irregular heartbeat -liver disease -recent or ongoing radiation therapy -an unusual or allergic reaction to doxorubicin, other chemotherapy agents, other medicines, foods, dyes, or preservatives -pregnant or trying to get pregnant -breast-feeding How should I use this medicine? This drug is given as an infusion into a vein. It is administered in a hospital or clinic by a specially trained health care professional. If you have pain, swelling, burning or any unusual feeling around the site of your injection, tell your health care professional right away. Talk to your pediatrician regarding the use of this medicine in children. Special care may be needed. Overdosage: If you think you have taken too much of this medicine contact a poison control center or emergency room at once. NOTE: This medicine is only for you. Do not share this medicine with others. What if I miss a dose? It is important not to miss your dose. Call your doctor or health care professional if you are unable to keep an appointment. What may interact with this medicine? Do not take this medicine with any of the following  medications: -cisapride -droperidol -halofantrine -pimozide -zidovudine This medicine may also interact with the following medications: -chloroquine -chlorpromazine -clarithromycin -cyclophosphamide -cyclosporine -erythromycin -medicines for depression, anxiety, or psychotic disturbances -medicines for irregular heart beat like amiodarone, bepridil, dofetilide, encainide, flecainide, propafenone, quinidine -medicines for seizures like ethotoin, fosphenytoin, phenytoin -medicines for nausea, vomiting like dolasetron, ondansetron, palonosetron -medicines to increase blood counts like filgrastim, pegfilgrastim, sargramostim -methadone -methotrexate -pentamidine -progesterone -vaccines -verapamil Talk to your doctor or health care professional before taking any of these medicines: -acetaminophen -aspirin -ibuprofen -ketoprofen -naproxen This list may not describe all possible interactions. Give your health care provider a list of all the medicines, herbs, non-prescription drugs, or dietary supplements you use. Also tell them if you smoke, drink alcohol, or use illegal drugs. Some items may interact with your medicine. What should I watch for while using this medicine? Your condition will be monitored carefully while you are receiving this medicine. You will need important blood work done while you are taking this medicine. This drug may make you feel generally unwell. This is not uncommon, as chemotherapy can affect healthy cells as well as cancer cells. Report any side effects. Continue your course of treatment even though you feel ill unless your doctor tells you to stop. Your urine may turn red for a few days after your dose. This is not blood. If your urine is dark or brown, call your doctor. In some cases, you may be given additional medicines to help with side effects. Follow all directions for their use. Call your doctor or health care professional for advice if you get a  fever, chills or sore throat, or other symptoms of a cold or flu. Do not   treat yourself. This drug decreases your body's ability to fight infections. Try to avoid being around people who are sick. This medicine may increase your risk to bruise or bleed. Call your doctor or health care professional if you notice any unusual bleeding. Be careful brushing and flossing your teeth or using a toothpick because you may get an infection or bleed more easily. If you have any dental work done, tell your dentist you are receiving this medicine. Avoid taking products that contain aspirin, acetaminophen, ibuprofen, naproxen, or ketoprofen unless instructed by your doctor. These medicines may hide a fever. Men and women of childbearing age should use effective birth control methods while using taking this medicine. Do not become pregnant while taking this medicine. There is a potential for serious side effects to an unborn child. Talk to your health care professional or pharmacist for more information. Do not breast-feed an infant while taking this medicine. Do not let others touch your urine or other body fluids for 5 days after each treatment with this medicine. Caregivers should wear latex gloves to avoid touching body fluids during this time. There is a maximum amount of this medicine you should receive throughout your life. The amount depends on the medical condition being treated and your overall health. Your doctor will watch how much of this medicine you receive in your lifetime. Tell your doctor if you have taken this medicine before. What side effects may I notice from receiving this medicine? Side effects that you should report to your doctor or health care professional as soon as possible: -allergic reactions like skin rash, itching or hives, swelling of the face, lips, or tongue -low blood counts - this medicine may decrease the number of white blood cells, red blood cells and platelets. You may be at  increased risk for infections and bleeding. -signs of infection - fever or chills, cough, sore throat, pain or difficulty passing urine -signs of decreased platelets or bleeding - bruising, pinpoint red spots on the skin, black, tarry stools, blood in the urine -signs of decreased red blood cells - unusually weak or tired, fainting spells, lightheadedness -breathing problems -chest pain -fast, irregular heartbeat -mouth sores -nausea, vomiting -pain, swelling, redness at site where injected -pain, tingling, numbness in the hands or feet -swelling of ankles, feet, or hands -unusual bleeding or bruising Side effects that usually do not require medical attention (report to your doctor or health care professional if they continue or are bothersome): -diarrhea -facial flushing -hair loss -loss of appetite -missed menstrual periods -nail discoloration or damage -red or watery eyes -red colored urine -stomach upset This list may not describe all possible side effects. Call your doctor for medical advice about side effects. You may report side effects to FDA at 1-800-FDA-1088. Where should I keep my medicine? This drug is given in a hospital or clinic and will not be stored at home. NOTE: This sheet is a summary. It may not cover all possible information. If you have questions about this medicine, talk to your doctor, pharmacist, or health care provider.  2015, [employer]. (2012-06-22 09:54:34) Cyclophosphamide injection What is this medicine? CYCLOPHOSPHAMIDE (sye kloe FOSS fa mide) is a chemotherapy drug. It slows the growth of cancer cells. This medicine is used to treat many types of cancer like lymphoma, myeloma, leukemia, breast cancer, and ovarian cancer, to name a few. This medicine may be used for other purposes; ask your health care provider or pharmacist if you have questions. COMMON BRAND NAME(S): Cytoxan,  Neosar What should I tell my health care provider before I  take this medicine? They need to know if you have any of these conditions: -blood disorders -history of other chemotherapy -infection -kidney disease -liver disease -recent or ongoing radiation therapy -tumors in the bone marrow -an unusual or allergic reaction to cyclophosphamide, other chemotherapy, other medicines, foods, dyes, or preservatives -pregnant or trying to get pregnant -breast-feeding How should I use this medicine? This drug is usually given as an injection into a vein or muscle or by infusion into a vein. It is administered in a hospital or clinic by a specially trained health care professional. Talk to your pediatrician regarding the use of this medicine in children. Special care may be needed. Overdosage: If you think you have taken too much of this medicine contact a poison control center or emergency room at once. NOTE: This medicine is only for you. Do not share this medicine with others. What if I miss a dose? It is important not to miss your dose. Call your doctor or health care professional if you are unable to keep an appointment. What may interact with this medicine? This medicine may interact with the following medications: -amiodarone -amphotericin B -azathioprine -certain antiviral medicines for HIV or AIDS such as protease inhibitors (e.g., indinavir, ritonavir) and zidovudine -certain blood pressure medications such as benazepril, captopril, enalapril, fosinopril, lisinopril, moexipril, monopril, perindopril, quinapril, ramipril, trandolapril -certain cancer medications such as anthracyclines (e.g., daunorubicin, doxorubicin), busulfan, cytarabine, paclitaxel, pentostatin, tamoxifen, trastuzumab -certain diuretics such as chlorothiazide, chlorthalidone, hydrochlorothiazide, indapamide, metolazone -certain medicines that treat or prevent blood clots like warfarin -certain muscle relaxants such as  succinylcholine -cyclosporine -etanercept -indomethacin -medicines to increase blood counts like filgrastim, pegfilgrastim, sargramostim -medicines used as general anesthesia -metronidazole -natalizumab This list may not describe all possible interactions. Give your health care provider a list of all the medicines, herbs, non-prescription drugs, or dietary supplements you use. Also tell them if you smoke, drink alcohol, or use illegal drugs. Some items may interact with your medicine. What should I watch for while using this medicine? Visit your doctor for checks on your progress. This drug may make you feel generally unwell. This is not uncommon, as chemotherapy can affect healthy cells as well as cancer cells. Report any side effects. Continue your course of treatment even though you feel ill unless your doctor tells you to stop. Drink water or other fluids as directed. Urinate often, even at night. In some cases, you may be given additional medicines to help with side effects. Follow all directions for their use. Call your doctor or health care professional for advice if you get a fever, chills or sore throat, or other symptoms of a cold or flu. Do not treat yourself. This drug decreases your body's ability to fight infections. Try to avoid being around people who are sick. This medicine may increase your risk to bruise or bleed. Call your doctor or health care professional if you notice any unusual bleeding. Be careful brushing and flossing your teeth or using a toothpick because you may get an infection or bleed more easily. If you have any dental work done, tell your dentist you are receiving this medicine. You may get drowsy or dizzy. Do not drive, use machinery, or do anything that needs mental alertness until you know how this medicine affects you. Do not become pregnant while taking this medicine or for 1 year after stopping it. Women should inform their doctor if they wish to become  pregnant or think they might be pregnant. Men should not father a child while taking this medicine and for 4 months after stopping it. There is a potential for serious side effects to an unborn child. Talk to your health care professional or pharmacist for more information. Do not breast-feed an infant while taking this medicine. This medicine may interfere with the ability to have a child. This medicine has caused ovarian failure in some women. This medicine has caused reduced sperm counts in some men. You should talk with your doctor or health care professional if you are concerned about your fertility. If you are going to have surgery, tell your doctor or health care professional that you have taken this medicine. What side effects may I notice from receiving this medicine? Side effects that you should report to your doctor or health care professional as soon as possible: -allergic reactions like skin rash, itching or hives, swelling of the face, lips, or tongue -low blood counts - this medicine may decrease the number of white blood cells, red blood cells and platelets. You may be at increased risk for infections and bleeding. -signs of infection - fever or chills, cough, sore throat, pain or difficulty passing urine -signs of decreased platelets or bleeding - bruising, pinpoint red spots on the skin, black, tarry stools, blood in the urine -signs of decreased red blood cells - unusually weak or tired, fainting spells, lightheadedness -breathing problems -dark urine -dizziness -palpitations -swelling of the ankles, feet, hands -trouble passing urine or change in the amount of urine -weight gain -yellowing of the eyes or skin Side effects that usually do not require medical attention (report to your doctor or health care professional if they continue or are bothersome): -changes in nail or skin color -hair loss -missed menstrual periods -mouth sores -nausea, vomiting This list may not  describe all possible side effects. Call your doctor for medical advice about side effects. You may report side effects to FDA at 1-800-FDA-1088. Where should I keep my medicine? This drug is given in a hospital or clinic and will not be stored at home. NOTE: This sheet is a summary. It may not cover all possible information. If you have questions about this medicine, talk to your doctor, pharmacist, or health care provider.  2015, [employer]. (2012-01-09 16:22:58) Paclitaxel injection What is this medicine? PACLITAXEL (PAK li TAX el) is a chemotherapy drug. It targets fast dividing cells, like cancer cells, and causes these cells to die. This medicine is used to treat ovarian cancer, breast cancer, and other cancers. This medicine may be used for other purposes; ask your health care provider or pharmacist if you have questions. COMMON BRAND NAME(S): Onxol, Taxol What should I tell my health care provider before I take this medicine? They need to know if you have any of these conditions: -blood disorders -irregular heartbeat -infection (especially a virus infection such as chickenpox, cold sores, or herpes) -liver disease -previous or ongoing radiation therapy -an unusual or allergic reaction to paclitaxel, alcohol, polyoxyethylated castor oil, other chemotherapy agents, other medicines, foods, dyes, or preservatives -pregnant or trying to get pregnant -breast-feeding How should I use this medicine? This drug is given as an infusion into a vein. It is administered in a hospital or clinic by a specially trained health care professional. Talk to your pediatrician regarding the use of this medicine in children. Special care may be needed. Overdosage: If you think you have taken too much of this medicine contact a poison  control center or emergency room at once. NOTE: This medicine is only for you. Do not share this medicine with others. What if I miss a dose? It is important not  to miss your dose. Call your doctor or health care professional if you are unable to keep an appointment. What may interact with this medicine? Do not take this medicine with any of the following medications: -disulfiram -metronidazole This medicine may also interact with the following medications: -cyclosporine -diazepam -ketoconazole -medicines to increase blood counts like filgrastim, pegfilgrastim, sargramostim -other chemotherapy drugs like cisplatin, doxorubicin, epirubicin, etoposide, teniposide, vincristine -quinidine -testosterone -vaccines -verapamil Talk to your doctor or health care professional before taking any of these medicines: -acetaminophen -aspirin -ibuprofen -ketoprofen -naproxen This list may not describe all possible interactions. Give your health care provider a list of all the medicines, herbs, non-prescription drugs, or dietary supplements you use. Also tell them if you smoke, drink alcohol, or use illegal drugs. Some items may interact with your medicine. What should I watch for while using this medicine? Your condition will be monitored carefully while you are receiving this medicine. You will need important blood work done while you are taking this medicine. This drug may make you feel generally unwell. This is not uncommon, as chemotherapy can affect healthy cells as well as cancer cells. Report any side effects. Continue your course of treatment even though you feel ill unless your doctor tells you to stop. In some cases, you may be given additional medicines to help with side effects. Follow all directions for their use. Call your doctor or health care professional for advice if you get a fever, chills or sore throat, or other symptoms of a cold or flu. Do not treat yourself. This drug decreases your body's ability to fight infections. Try to avoid being around people who are sick. This medicine may increase your risk to bruise or bleed. Call your doctor or  health care professional if you notice any unusual bleeding. Be careful brushing and flossing your teeth or using a toothpick because you may get an infection or bleed more easily. If you have any dental work done, tell your dentist you are receiving this medicine. Avoid taking products that contain aspirin, acetaminophen, ibuprofen, naproxen, or ketoprofen unless instructed by your doctor. These medicines may hide a fever. Do not become pregnant while taking this medicine. Women should inform their doctor if they wish to become pregnant or think they might be pregnant. There is a potential for serious side effects to an unborn child. Talk to your health care professional or pharmacist for more information. Do not breast-feed an infant while taking this medicine. Men are advised not to father a child while receiving this medicine. What side effects may I notice from receiving this medicine? Side effects that you should report to your doctor or health care professional as soon as possible: -allergic reactions like skin rash, itching or hives, swelling of the face, lips, or tongue -low blood counts - This drug may decrease the number of white blood cells, red blood cells and platelets. You may be at increased risk for infections and bleeding. -signs of infection - fever or chills, cough, sore throat, pain or difficulty passing urine -signs of decreased platelets or bleeding - bruising, pinpoint red spots on the skin, black, tarry stools, nosebleeds -signs of decreased red blood cells - unusually weak or tired, fainting spells, lightheadedness -breathing problems -chest pain -high or low blood pressure -mouth sores -nausea and vomiting -pain,  swelling, redness or irritation at the injection site -pain, tingling, numbness in the hands or feet -slow or irregular heartbeat -swelling of the ankle, feet, hands Side effects that usually do not require medical attention (report to your doctor or health  care professional if they continue or are bothersome): -bone pain -complete hair loss including hair on your head, underarms, pubic hair, eyebrows, and eyelashes -changes in the color of fingernails -diarrhea -loosening of the fingernails -loss of appetite -muscle or joint pain -red flush to skin -sweating This list may not describe all possible side effects. Call your doctor for medical advice about side effects. You may report side effects to FDA at 1-800-FDA-1088. Where should I keep my medicine? This drug is given in a hospital or clinic and will not be stored at home. NOTE: This sheet is a summary. It may not cover all possible information. If you have questions about this medicine, talk to your doctor, pharmacist, or health care provider.  2015, [employer] Standard. (2012-04-19 16:41:21)

## 2014-11-03 ENCOUNTER — Inpatient Hospital Stay: Admission: RE | Admit: 2014-11-03 | Payer: 59 | Source: Ambulatory Visit

## 2014-11-03 ENCOUNTER — Encounter: Payer: Self-pay | Admitting: *Deleted

## 2014-11-03 DIAGNOSIS — J302 Other seasonal allergic rhinitis: Secondary | ICD-10-CM | POA: Diagnosis not present

## 2014-11-03 DIAGNOSIS — D0512 Intraductal carcinoma in situ of left breast: Secondary | ICD-10-CM | POA: Diagnosis not present

## 2014-11-03 DIAGNOSIS — Z79891 Long term (current) use of opiate analgesic: Secondary | ICD-10-CM | POA: Diagnosis not present

## 2014-11-03 DIAGNOSIS — Z6833 Body mass index (BMI) 33.0-33.9, adult: Secondary | ICD-10-CM | POA: Diagnosis not present

## 2014-11-03 DIAGNOSIS — Z9851 Tubal ligation status: Secondary | ICD-10-CM | POA: Diagnosis not present

## 2014-11-03 DIAGNOSIS — H04129 Dry eye syndrome of unspecified lacrimal gland: Secondary | ICD-10-CM | POA: Diagnosis not present

## 2014-11-03 DIAGNOSIS — E669 Obesity, unspecified: Secondary | ICD-10-CM | POA: Diagnosis not present

## 2014-11-03 DIAGNOSIS — Z8041 Family history of malignant neoplasm of ovary: Secondary | ICD-10-CM | POA: Diagnosis not present

## 2014-11-03 DIAGNOSIS — Z79899 Other long term (current) drug therapy: Secondary | ICD-10-CM | POA: Diagnosis not present

## 2014-11-03 DIAGNOSIS — Z9889 Other specified postprocedural states: Secondary | ICD-10-CM | POA: Diagnosis not present

## 2014-11-03 DIAGNOSIS — Z7982 Long term (current) use of aspirin: Secondary | ICD-10-CM | POA: Diagnosis not present

## 2014-11-03 DIAGNOSIS — Z91013 Allergy to seafood: Secondary | ICD-10-CM | POA: Diagnosis not present

## 2014-11-03 DIAGNOSIS — Z91048 Other nonmedicinal substance allergy status: Secondary | ICD-10-CM | POA: Diagnosis not present

## 2014-11-03 DIAGNOSIS — I10 Essential (primary) hypertension: Secondary | ICD-10-CM | POA: Diagnosis not present

## 2014-11-03 DIAGNOSIS — K219 Gastro-esophageal reflux disease without esophagitis: Secondary | ICD-10-CM | POA: Diagnosis not present

## 2014-11-03 DIAGNOSIS — Z87442 Personal history of urinary calculi: Secondary | ICD-10-CM | POA: Diagnosis not present

## 2014-11-03 NOTE — Patient Instructions (Signed)
  Your procedure is scheduled on: 11-09-14 Report to [place]. To find out your arrival time please call [phone] between 1PM - 3PM on 11-08-14 [facility])  Remember: Instructions that are not followed completely may result in serious medical risk, up to and including death, or upon the discretion of your surgeon and anesthesiologist your surgery may need to be rescheduled.    _X___ 1. Do not eat food or drink liquids after midnight. No gum chewing or hard candies.     _X___ 2. No Alcohol for 24 hours before or after surgery.   ____ 3. Bring all medications with you on the day of surgery if instructed.    _X___ 4. Notify your doctor if there is any change in your medical condition     (cold, fever, infections).     Do not wear jewelry, make-up, hairpins, clips or nail polish.  Do not wear lotions, powders, or perfumes. You may wear deodorant.  Do not shave 48 hours prior to surgery. Men may shave face and neck.  Do not bring valuables to the hospital.    [facility] is not responsible for any belongings or valuables.               Contacts, dentures or bridgework may not be worn into surgery.  Leave your suitcase in the car. After surgery it may be brought to your room.  For patients admitted to the hospital, discharge time is determined by your  treatment team.   Patients discharged the day of surgery will not be allowed to drive home.   Please read over the following fact sheets that you were given:      _X___ Take these medicines the morning of surgery with A SIP OF WATER:    1. Veteran  2. TAKE NEXIUM Wednesday NIGHT  3.   4.  5.  6.  ____ Fleet Enema (as directed)   __X__ Use CHG Soap as directed  __X__ Use inhalers on the day of surgery-USE ALBUTEROL AND [place]   ____ Stop metformin 2 days prior to surgery    ____ Take 1/2 of usual insulin dose the night before surgery and none on the morning of surgery.   ____ Stop  Coumadin/Plavix/aspirin-N/A  ____ Stop Anti-inflammatories- NO NSAIDS OR ASPIRIN PRODUCTS-TYLENOL OK   __X__ Stop supplements until after surgery-STOP B COMPLEX NOW   ____ Bring C-Pap to the hospital.

## 2014-11-07 ENCOUNTER — Other Ambulatory Visit: Payer: Self-pay | Admitting: *Deleted

## 2014-11-07 ENCOUNTER — Other Ambulatory Visit: Payer: 59

## 2014-11-07 ENCOUNTER — Telehealth: Payer: Self-pay | Admitting: Radiology

## 2014-11-07 ENCOUNTER — Encounter
Admission: RE | Admit: 2014-11-07 | Discharge: 2014-11-07 | Disposition: A | Discharge status: Home / Self Care | Payer: 59 | Source: Ambulatory Visit | Attending: Anesthesiology | Admitting: Anesthesiology

## 2014-11-07 ENCOUNTER — Inpatient Hospital Stay: Payer: 59

## 2014-11-07 DIAGNOSIS — D0512 Intraductal carcinoma in situ of left breast: Secondary | ICD-10-CM | POA: Diagnosis not present

## 2014-11-07 LAB — POTASSIUM: POTASSIUM: 3.4 mmol/L — AB (ref 3.5–5.1)

## 2014-11-07 NOTE — Telephone Encounter (Signed)
Contacted pt about her 6 mo f/u and CT scan.  She stated she is starting chemo for breast ca and wanted to know if she should proceed with the CT.  Please advise.

## 2014-11-08 NOTE — Telephone Encounter (Signed)
Per Larene Beach, pt is ok to proceed with CT scan while on chemo. Pt advised.

## 2014-11-09 ENCOUNTER — Ambulatory Visit: Payer: 59

## 2014-11-09 ENCOUNTER — Ambulatory Visit
Admission: RE | Admit: 2014-11-09 | Discharge: 2014-11-09 | Disposition: A | Discharge status: Home / Self Care | Payer: 59 | Source: Ambulatory Visit | Attending: General Surgery | Admitting: General Surgery

## 2014-11-09 ENCOUNTER — Ambulatory Visit: Payer: 59 | Admitting: Anesthesiology

## 2014-11-09 ENCOUNTER — Encounter: Payer: Self-pay | Admitting: Anesthesiology

## 2014-11-09 ENCOUNTER — Encounter
Admission: RE | Disposition: A | Discharge status: Home / Self Care | Payer: Self-pay | Source: Ambulatory Visit | Attending: General Surgery

## 2014-11-09 DIAGNOSIS — Z79899 Other long term (current) drug therapy: Secondary | ICD-10-CM | POA: Insufficient documentation

## 2014-11-09 DIAGNOSIS — J302 Other seasonal allergic rhinitis: Secondary | ICD-10-CM | POA: Insufficient documentation

## 2014-11-09 DIAGNOSIS — D0512 Intraductal carcinoma in situ of left breast: Secondary | ICD-10-CM | POA: Diagnosis not present

## 2014-11-09 DIAGNOSIS — Z95828 Presence of other vascular implants and grafts: Secondary | ICD-10-CM

## 2014-11-09 DIAGNOSIS — Z91013 Allergy to seafood: Secondary | ICD-10-CM | POA: Insufficient documentation

## 2014-11-09 DIAGNOSIS — Z87442 Personal history of urinary calculi: Secondary | ICD-10-CM | POA: Insufficient documentation

## 2014-11-09 DIAGNOSIS — Z7982 Long term (current) use of aspirin: Secondary | ICD-10-CM | POA: Insufficient documentation

## 2014-11-09 DIAGNOSIS — Z9889 Other specified postprocedural states: Secondary | ICD-10-CM | POA: Insufficient documentation

## 2014-11-09 DIAGNOSIS — C50911 Malignant neoplasm of unspecified site of right female breast: Secondary | ICD-10-CM | POA: Diagnosis not present

## 2014-11-09 DIAGNOSIS — H04129 Dry eye syndrome of unspecified lacrimal gland: Secondary | ICD-10-CM | POA: Insufficient documentation

## 2014-11-09 DIAGNOSIS — Z9851 Tubal ligation status: Secondary | ICD-10-CM | POA: Insufficient documentation

## 2014-11-09 DIAGNOSIS — K219 Gastro-esophageal reflux disease without esophagitis: Secondary | ICD-10-CM | POA: Insufficient documentation

## 2014-11-09 DIAGNOSIS — Z6833 Body mass index (BMI) 33.0-33.9, adult: Secondary | ICD-10-CM | POA: Insufficient documentation

## 2014-11-09 DIAGNOSIS — Z91048 Other nonmedicinal substance allergy status: Secondary | ICD-10-CM | POA: Insufficient documentation

## 2014-11-09 DIAGNOSIS — Z8041 Family history of malignant neoplasm of ovary: Secondary | ICD-10-CM | POA: Insufficient documentation

## 2014-11-09 DIAGNOSIS — E669 Obesity, unspecified: Secondary | ICD-10-CM | POA: Insufficient documentation

## 2014-11-09 DIAGNOSIS — Z79891 Long term (current) use of opiate analgesic: Secondary | ICD-10-CM | POA: Insufficient documentation

## 2014-11-09 DIAGNOSIS — I10 Essential (primary) hypertension: Secondary | ICD-10-CM | POA: Insufficient documentation

## 2014-11-09 HISTORY — PX: PORTACATH PLACEMENT: SHX2246

## 2014-11-09 SURGERY — INSERTION, TUNNELED CENTRAL VENOUS DEVICE, WITH PORT
Anesthesia: Monitor Anesthesia Care | Laterality: Left | Wound class: Clean

## 2014-11-09 MED ORDER — FENTANYL CITRATE (PF) 100 MCG/2ML IJ SOLN
INTRAMUSCULAR | Status: DC | PRN
  Administered 2014-11-09: 50 ug via INTRAVENOUS

## 2014-11-09 MED ORDER — ONDANSETRON HCL 4 MG/2ML IJ SOLN
INTRAMUSCULAR | Status: DC | PRN
  Administered 2014-11-09: 4 mg via INTRAVENOUS

## 2014-11-09 MED ORDER — CEFAZOLIN SODIUM-DEXTROSE 2-3 GM-% IV SOLR
2.0000 g | INTRAVENOUS | Status: AC
  Administered 2014-11-09: 2 g via INTRAVENOUS

## 2014-11-09 MED ORDER — CEFAZOLIN SODIUM-DEXTROSE 2-3 GM-% IV SOLR
INTRAVENOUS | Status: AC
  Filled 2014-11-09: qty 50

## 2014-11-09 MED ORDER — DEXAMETHASONE SODIUM PHOSPHATE 10 MG/ML IJ SOLN
INTRAMUSCULAR | Status: DC | PRN
  Administered 2014-11-09: 10 mg via INTRAVENOUS

## 2014-11-09 MED ORDER — LIDOCAINE HCL (PF) 1 % IJ SOLN
INTRAMUSCULAR | Status: AC
  Filled 2014-11-09: qty 30

## 2014-11-09 MED ORDER — LIDOCAINE HCL (PF) 1 % IJ SOLN
INTRAMUSCULAR | Status: DC | PRN
  Administered 2014-11-09: 16 mL

## 2014-11-09 MED ORDER — FENTANYL CITRATE (PF) 100 MCG/2ML IJ SOLN: 25.0000 ug | INTRAMUSCULAR | Status: DC | PRN

## 2014-11-09 MED ORDER — LIDOCAINE HCL (CARDIAC) 20 MG/ML IV SOLN
INTRAVENOUS | Status: DC | PRN
  Administered 2014-11-09: 100 mg via INTRAVENOUS

## 2014-11-09 MED ORDER — ONDANSETRON HCL 4 MG/2ML IJ SOLN: 4.0000 mg | Freq: Once | INTRAMUSCULAR | Status: DC | PRN

## 2014-11-09 MED ORDER — SODIUM CHLORIDE 0.9 % IJ SOLN
INTRAMUSCULAR | Status: DC | PRN
  Administered 2014-11-09: 20 mL via INTRAVENOUS

## 2014-11-09 MED ORDER — LACTATED RINGERS IV SOLN
INTRAVENOUS | Status: DC
  Administered 2014-11-09 (×2): via INTRAVENOUS

## 2014-11-09 MED ORDER — PROPOFOL INFUSION 10 MG/ML OPTIME
INTRAVENOUS | Status: DC | PRN
  Administered 2014-11-09: 50 ug/kg/min via INTRAVENOUS

## 2014-11-09 MED ORDER — MIDAZOLAM HCL 2 MG/2ML IJ SOLN
INTRAMUSCULAR | Status: DC | PRN
  Administered 2014-11-09: 2 mg via INTRAVENOUS

## 2014-11-09 MED ORDER — SODIUM CHLORIDE 0.9 % IJ SOLN
INTRAMUSCULAR | Status: AC
  Filled 2014-11-09: qty 50

## 2014-11-09 SURGICAL SUPPLY — 29 items
APL SKNCLS STERI-STRIP NONHPOA (GAUZE/BANDAGES/DRESSINGS) ×1
BENZOIN TINCTURE PRP APPL 2/3 (GAUZE/BANDAGES/DRESSINGS) ×2 IMPLANT
BLADE SURG 15 STRL SS SAFETY (BLADE) ×2 IMPLANT
CHLORAPREP W/TINT 26ML (MISCELLANEOUS) ×2 IMPLANT
COVER LIGHT HANDLE STERIS (MISCELLANEOUS) ×4 IMPLANT
DECANTER SPIKE VIAL GLASS SM (MISCELLANEOUS) ×2 IMPLANT
DRAPE C-ARM XRAY 36X54 (DRAPES) ×2 IMPLANT
DRAPE LAPAROTOMY TRNSV 106X77 (MISCELLANEOUS) ×2 IMPLANT
DRESSING TELFA 4X3 1S ST N-ADH (GAUZE/BANDAGES/DRESSINGS) ×2 IMPLANT
DRSG TEGADERM 2-3/8X2-3/4 SM (GAUZE/BANDAGES/DRESSINGS) ×2 IMPLANT
DRSG TEGADERM 4X4.75 (GAUZE/BANDAGES/DRESSINGS) ×2 IMPLANT
DRSG TELFA 3X8 NADH (GAUZE/BANDAGES/DRESSINGS) ×2 IMPLANT
GLOVE BIO SURGEON STRL SZ7.5 (GLOVE) ×4 IMPLANT
GLOVE INDICATOR 8.0 STRL GRN (GLOVE) ×4 IMPLANT
GOWN STRL REUS W/ TWL LRG LVL3 (GOWN DISPOSABLE) ×2 IMPLANT
GOWN STRL REUS W/TWL LRG LVL3 (GOWN DISPOSABLE) ×4
KIT RM TURNOVER STRD PROC AR (KITS) ×2 IMPLANT
LABEL OR SOLS (LABEL) ×2 IMPLANT
NS IRRIG 500ML POUR BTL (IV SOLUTION) ×2 IMPLANT
PACK PORT-A-CATH (MISCELLANEOUS) ×2 IMPLANT
PAD GROUND ADULT SPLIT (MISCELLANEOUS) ×2 IMPLANT
PORTACATH POWER 8F (Port) ×2 IMPLANT
STRIP CLOSURE SKIN 1/2X4 (GAUZE/BANDAGES/DRESSINGS) ×2 IMPLANT
SUT PROLENE 3 0 SH DA (SUTURE) ×2 IMPLANT
SUT VIC AB 3-0 SH 27 (SUTURE) ×2
SUT VIC AB 3-0 SH 27X BRD (SUTURE) ×1 IMPLANT
SUT VIC AB 4-0 FS2 27 (SUTURE) ×2 IMPLANT
SWABSTK COMLB BENZOIN TINCTURE (MISCELLANEOUS) ×2 IMPLANT
SYR CONTROL 10ML (SYRINGE) ×4 IMPLANT

## 2014-11-09 NOTE — H&P (Signed)
No change in cardiopulmonary exam.  For left side PowerPort placement.

## 2014-11-09 NOTE — Op Note (Signed)
Preoperative diagnosis: Breast cancer, need for central venous access.  Postoperative diagnosis: Same.  Operative procedure: Left subclavian PowerPort placement.  Operating surgeon: Hervey Ard, M.D.  Anesthesia attended local, 15 mL 1% plain Xylocaine.  Estimated blood loss: 10 mL.  Clinical note: This 54 year old woman has a triple negative breast cancer and is considered a candidate for adjuvant chemotherapy. Central venous access was requested by her treating oncologist.  Operative note: The patient was placed supine on the operating table in the left chest and neck was prepped with core prepped and draped. Ultrasound used to confirm location and patency of the subclavian vessel. This was cannulated under ultrasound guidance after installation of 1% plain Xylocaine. The guidewires of angina the heart followed by the dilator and the catheter. Under fluoroscopy this is positioned at the junction of the SVC and right atrium. The catheter was tunneled to a pocket on the left anterior chest. The port was attached. It easily irrigated and aspirated in the supine position. The port was anchored to the deep tissue with 3-0 proline sutures 2. The adipose layer was approximated with a running 3-0 Vicryls. The skin was closed with a running 4-0 Vicryls. Benzoin, Steri-Strips were applied. The port was cannulated and flushed with 10 mL of injectable saline for planned chemotherapy treatment tomorrow. ATegaderm dressing was placed over the access cannula and the end And clamp were fixed to prevent leakage.  The patient was taken to recovery room in stable condition where chest x-ray is pending at time of dictation.

## 2014-11-09 NOTE — Anesthesia Postprocedure Evaluation (Signed)
  Anesthesia Post-op Note  Patient: Adrienne [name]  Procedure(s) Performed: Procedure(s): INSERTION PORT-A-CATH (Left)  Anesthesia type:MAC  Patient location: PACU  Post pain: Pain level controlled  Post assessment: Post-op Vital signs reviewed, Patient's Cardiovascular Status Stable, Respiratory Function Stable, Patent Airway and No signs of Nausea or vomiting  Post vital signs: Reviewed and stable  Last Vitals:  Filed Vitals:   11/09/14 1010  BP: 121/69  Pulse:   Temp:   Resp: 16    Level of consciousness: awake, alert  and patient cooperative  Complications: No apparent anesthesia complications

## 2014-11-09 NOTE — Transfer of Care (Signed)
Immediate Anesthesia Transfer of Care Note  Patient: Adrienne [name]  Procedure(s) Performed: Procedure(s): INSERTION PORT-A-CATH (Left)  Patient Location: PACU  Anesthesia Type:MAC  Level of Consciousness: awake, alert  and oriented  Airway & Oxygen Therapy: Patient Spontanous Breathing and Patient connected to face mask oxygen  Post-op Assessment: Report given to RN and Post -op Vital signs reviewed and stable  Post vital signs: stable  Last Vitals:  Filed Vitals:   11/09/14 0907  BP: 127/65  Pulse: 81  Temp: 36.2 C  Resp: 15    Complications: No apparent anesthesia complications

## 2014-11-09 NOTE — Anesthesia Preprocedure Evaluation (Signed)
Anesthesia Evaluation  Patient identified by MRN, date of birth, ID band Patient awake    Reviewed: Allergy & Precautions, NPO status , Patient's Chart, lab work & pertinent test results, reviewed documented beta blocker date and time   Airway Mallampati: II  TM Distance: >3 FB     Dental  (+) Chipped   Pulmonary asthma ,          Cardiovascular hypertension, Pt. on medications     Neuro/Psych    GI/Hepatic GERD-  ,  Endo/Other    Renal/GU Renal InsufficiencyRenal disease     Musculoskeletal   Abdominal   Peds  Hematology  (+) anemia ,   Anesthesia Other Findings   Reproductive/Obstetrics                             Anesthesia Physical Anesthesia Plan  ASA: II  Anesthesia Plan: MAC   Post-op Pain Management:    Induction:   Airway Management Planned:   Additional Equipment:   Intra-op Plan:   Post-operative Plan:   Informed Consent: I have reviewed the patients History and Physical, chart, labs and discussed the procedure including the risks, benefits and alternatives for the proposed anesthesia with the patient or authorized representative who has indicated his/her understanding and acceptance.     Plan Discussed with: CRNA  Anesthesia Plan Comments:         Anesthesia Quick Evaluation

## 2014-11-10 ENCOUNTER — Inpatient Hospital Stay: Payer: 59

## 2014-11-10 ENCOUNTER — Inpatient Hospital Stay: Payer: 59 | Attending: Internal Medicine

## 2014-11-10 ENCOUNTER — Inpatient Hospital Stay: Payer: 59 | Admitting: Internal Medicine

## 2014-11-10 ENCOUNTER — Ambulatory Visit: Payer: 59

## 2014-11-10 DIAGNOSIS — Z452 Encounter for adjustment and management of vascular access device: Secondary | ICD-10-CM | POA: Diagnosis not present

## 2014-11-10 DIAGNOSIS — D649 Anemia, unspecified: Secondary | ICD-10-CM | POA: Diagnosis not present

## 2014-11-10 DIAGNOSIS — Z5111 Encounter for antineoplastic chemotherapy: Secondary | ICD-10-CM | POA: Diagnosis not present

## 2014-11-10 DIAGNOSIS — Z853 Personal history of malignant neoplasm of breast: Secondary | ICD-10-CM | POA: Insufficient documentation

## 2014-11-10 DIAGNOSIS — C50911 Malignant neoplasm of unspecified site of right female breast: Secondary | ICD-10-CM | POA: Diagnosis present

## 2014-11-10 DIAGNOSIS — I10 Essential (primary) hypertension: Secondary | ICD-10-CM | POA: Insufficient documentation

## 2014-11-10 DIAGNOSIS — Z418 Encounter for other procedures for purposes other than remedying health state: Secondary | ICD-10-CM | POA: Insufficient documentation

## 2014-11-10 DIAGNOSIS — Z79899 Other long term (current) drug therapy: Secondary | ICD-10-CM | POA: Diagnosis not present

## 2014-11-10 DIAGNOSIS — J45909 Unspecified asthma, uncomplicated: Secondary | ICD-10-CM | POA: Diagnosis not present

## 2014-11-10 DIAGNOSIS — Z171 Estrogen receptor negative status [ER-]: Secondary | ICD-10-CM | POA: Insufficient documentation

## 2014-11-10 DIAGNOSIS — E669 Obesity, unspecified: Secondary | ICD-10-CM | POA: Insufficient documentation

## 2014-11-10 DIAGNOSIS — K219 Gastro-esophageal reflux disease without esophagitis: Secondary | ICD-10-CM | POA: Diagnosis not present

## 2014-11-10 MED ORDER — HEPARIN SOD (PORK) LOCK FLUSH 100 UNIT/ML IV SOLN
500.0000 [IU] | Freq: Once | INTRAVENOUS | Status: AC
  Administered 2014-11-10: 500 [IU] via INTRAVENOUS

## 2014-11-10 MED ORDER — SODIUM CHLORIDE 0.9 % IJ SOLN
10.0000 mL | INTRAMUSCULAR | Status: AC | PRN
  Administered 2014-11-10: 10 mL via INTRAVENOUS
  Filled 2014-11-10: qty 10

## 2014-11-10 MED ORDER — HEPARIN SOD (PORK) LOCK FLUSH 100 UNIT/ML IV SOLN
INTRAVENOUS | Status: AC
  Filled 2014-11-10: qty 5

## 2014-11-17 ENCOUNTER — Inpatient Hospital Stay: Payer: 59

## 2014-11-17 ENCOUNTER — Telehealth: Payer: Self-pay | Admitting: *Deleted

## 2014-11-17 ENCOUNTER — Inpatient Hospital Stay: Payer: 59 | Admitting: Internal Medicine

## 2014-11-17 NOTE — Telephone Encounter (Signed)
Was on conference call with [facility] HR, the pt, and [employer]-call center [facility] a Marine scientist. I explained to her that we have been calling since Thursday of last week.  Myself as well as Sharyn Lull who is our insurance person here and several times we were told it was sent expedited for example last Friday 9/2 again on 9/6 and then today we are told that it was not expedited and my understanding is that when it is expedited that it should be decided in 24-48 hours and we are past that.  When pt called herself yest. She was told that it can take up to 15 days to make decision.  I explained that the pt needs to start treatment right away.  I feel that when I speak to a person on the phone we get same response it has been expedited but never taken care of.  She placed [place] on hold and spoke to the correct dept and said it has been properly sent to right dept with expedited and critical urgency also. She said to call back  In 1-2 hours and we should have response.  I called back and spoke to [facility] who placed me on hold and then came back stating same thing-it has been expedited and sent to case manager and I asked to speak to that person assigned to case-he placed me on hold and told me he spoke with his supervisor Gregary Signs who has the access to contact case manager and either the supervisor or case manager will contact me before end of the day. I will await call back. I have update pt with the current status. I also asked Cristie Hem to send a mess. To Lattie Haw who was on the phone this am to help [place]. He said he sent a message to her.

## 2014-11-24 ENCOUNTER — Other Ambulatory Visit: Payer: Self-pay | Admitting: *Deleted

## 2014-11-24 ENCOUNTER — Inpatient Hospital Stay: Payer: 59

## 2014-11-24 ENCOUNTER — Inpatient Hospital Stay (HOSPITAL_BASED_OUTPATIENT_CLINIC_OR_DEPARTMENT_OTHER): Payer: 59 | Admitting: Internal Medicine

## 2014-11-24 VITALS — BP 136/79 | HR 111 | Temp 97.5°F | Resp 18 | Ht 65.0 in | Wt 206.1 lb

## 2014-11-24 DIAGNOSIS — C50911 Malignant neoplasm of unspecified site of right female breast: Secondary | ICD-10-CM

## 2014-11-24 DIAGNOSIS — Z171 Estrogen receptor negative status [ER-]: Secondary | ICD-10-CM | POA: Diagnosis not present

## 2014-11-24 DIAGNOSIS — Z79899 Other long term (current) drug therapy: Secondary | ICD-10-CM

## 2014-11-24 DIAGNOSIS — C50912 Malignant neoplasm of unspecified site of left female breast: Secondary | ICD-10-CM

## 2014-11-24 DIAGNOSIS — D649 Anemia, unspecified: Secondary | ICD-10-CM | POA: Diagnosis not present

## 2014-11-24 DIAGNOSIS — C801 Malignant (primary) neoplasm, unspecified: Secondary | ICD-10-CM

## 2014-11-24 LAB — COMPREHENSIVE METABOLIC PANEL
ALT: 17 U/L (ref 14–54)
ANION GAP: 8 (ref 5–15)
AST: 29 U/L (ref 15–41)
Albumin: 3.7 g/dL (ref 3.5–5.0)
Alkaline Phosphatase: 82 U/L (ref 38–126)
BUN: 24 mg/dL — ABNORMAL HIGH (ref 6–20)
CALCIUM: 8.8 mg/dL — AB (ref 8.9–10.3)
CHLORIDE: 103 mmol/L (ref 101–111)
CO2: 26 mmol/L (ref 22–32)
CREATININE: 1.01 mg/dL — AB (ref 0.44–1.00)
Glucose, Bld: 194 mg/dL — ABNORMAL HIGH (ref 65–99)
Potassium: 3.3 mmol/L — ABNORMAL LOW (ref 3.5–5.1)
SODIUM: 137 mmol/L (ref 135–145)
Total Bilirubin: 0.4 mg/dL (ref 0.3–1.2)
Total Protein: 7.7 g/dL (ref 6.5–8.1)

## 2014-11-24 LAB — CBC WITH DIFFERENTIAL/PLATELET
Basophils Absolute: 0 10*3/uL (ref 0–0.1)
Basophils Relative: 1 %
EOS ABS: 0.2 10*3/uL (ref 0–0.7)
EOS PCT: 3 %
HCT: 32.8 % — ABNORMAL LOW (ref 35.0–47.0)
Hemoglobin: 10.8 g/dL — ABNORMAL LOW (ref 12.0–16.0)
LYMPHS ABS: 1.3 10*3/uL (ref 1.0–3.6)
LYMPHS PCT: 21 %
MCH: 30.8 pg (ref 26.0–34.0)
MCHC: 33 g/dL (ref 32.0–36.0)
MCV: 93.3 fL (ref 80.0–100.0)
MONO ABS: 0.4 10*3/uL (ref 0.2–0.9)
MONOS PCT: 7 %
Neutro Abs: 4.2 10*3/uL (ref 1.4–6.5)
Neutrophils Relative %: 68 %
PLATELETS: 206 10*3/uL (ref 150–440)
RBC: 3.51 MIL/uL — AB (ref 3.80–5.20)
RDW: 14.5 % (ref 11.5–14.5)
WBC: 6.1 10*3/uL (ref 3.6–11.0)

## 2014-11-24 MED ORDER — DOXORUBICIN HCL CHEMO IV INJECTION 2 MG/ML
60.0000 mg/m2 | Freq: Once | INTRAVENOUS | Status: AC
  Administered 2014-11-24: 124 mg via INTRAVENOUS
  Filled 2014-11-24: qty 62

## 2014-11-24 MED ORDER — SODIUM CHLORIDE 0.9 % IV SOLN
Freq: Once | INTRAVENOUS | Status: AC
  Administered 2014-11-24: 10:00:00 via INTRAVENOUS
  Filled 2014-11-24: qty 1000

## 2014-11-24 MED ORDER — PROMETHAZINE HCL 25 MG PO TABS
25.0000 mg | ORAL_TABLET | Freq: Four times a day (QID) | ORAL | Status: DC | PRN
Start: 2014-11-24 — End: 2015-08-27

## 2014-11-24 MED ORDER — CYCLOPHOSPHAMIDE CHEMO INJECTION 1 GM
600.0000 mg/m2 | Freq: Once | INTRAMUSCULAR | Status: AC
  Administered 2014-11-24: 1240 mg via INTRAVENOUS
  Filled 2014-11-24: qty 50

## 2014-11-24 MED ORDER — HEPARIN SOD (PORK) LOCK FLUSH 100 UNIT/ML IV SOLN
500.0000 [IU] | Freq: Once | INTRAVENOUS | Status: DC | PRN
  Filled 2014-11-24: qty 5

## 2014-11-24 MED ORDER — SODIUM CHLORIDE 0.9 % IV SOLN
Freq: Once | INTRAVENOUS | Status: AC
  Administered 2014-11-24: 10:00:00 via INTRAVENOUS
  Filled 2014-11-24: qty 5

## 2014-11-24 MED ORDER — PALONOSETRON HCL INJECTION 0.25 MG/5ML
0.2500 mg | Freq: Once | INTRAVENOUS | Status: AC
  Administered 2014-11-24: 0.25 mg via INTRAVENOUS
  Filled 2014-11-24: qty 5

## 2014-11-24 MED ORDER — SODIUM CHLORIDE 0.9 % IJ SOLN
10.0000 mL | Freq: Once | INTRAMUSCULAR | Status: AC
  Administered 2014-11-24: 10 mL via INTRAVENOUS
  Filled 2014-11-24: qty 10

## 2014-11-24 MED ORDER — PEGFILGRASTIM 6 MG/0.6ML ~~LOC~~ PSKT
6.0000 mg | PREFILLED_SYRINGE | Freq: Once | SUBCUTANEOUS | Status: AC
  Administered 2014-11-24: 6 mg via SUBCUTANEOUS
  Filled 2014-11-24: qty 0.6

## 2014-11-24 MED ORDER — POTASSIUM CHLORIDE CRYS ER 10 MEQ PO TBCR: 10.0000 meq | EXTENDED_RELEASE_TABLET | Freq: Every day | ORAL | Status: DC

## 2014-11-24 MED ORDER — SODIUM CHLORIDE 0.9 % IJ SOLN
10.0000 mL | INTRAMUSCULAR | Status: DC | PRN
  Filled 2014-11-24: qty 10

## 2014-11-24 MED ORDER — HEPARIN SOD (PORK) LOCK FLUSH 100 UNIT/ML IV SOLN
500.0000 [IU] | Freq: Once | INTRAVENOUS | Status: AC
  Administered 2014-11-24: 500 [IU] via INTRAVENOUS

## 2014-11-24 NOTE — Progress Notes (Signed)
[facility]  [phone] [phone]) [phone]     ID: [name] OB: 1960/11/25  MR#: [id number]  JKD#:[id number]  Patient Care Team: Guadalupe Maple, MD as PCP - General (Unknown Physician Specialty) Robert Bellow, MD (General Surgery) Guadalupe Maple, MD (Family Medicine)  CHIEF COMPLAINT/DIAGNOSIS:  1. 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 Surgical pathology reports tumor size 0.5 cm, grade 3, margins uninvolved by invasive carcinoma. One sentinel lymph node negative. ER negative, PR negative, HER-2/neu negative (1+ on IHC)  -  patient referred here for Oncology evaluation regarding systemic adjuvant treatment.  2. 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  Tumor size 0.7 cm, grade 3, margins negative (closest margin is 0.8 mm to anterior).  1 sentinel lymph node negative.  ER negative (0 - 5%), PR negative (<1 - 5%) and HER-2/neu negative by FISH.  08/01/14 - Mammaprint Recurrence Assay - High Risk (Molecular Subtype is Basal-Type) score of -0.814 (5 year risk is 22%, 10 year risk is 29%).  HISTORY OF PRESENT ILLNESS:  Patient returns for continued oncology follow-up and start adjuvant chemotherapy with Adriamycin/Cytoxan. States that she is doing fairly well, denies any new complaints. No nausea or vomiting. No diarrhea. Appetite is good. No bowel disturbances or urinary symptoms. Remains physically active and ambulatory. No new cough, dyspnea or orthopnea. No new bone pains. MUGA scan shows normal LV EF.  REVIEW OF SYSTEMS:   ROS As in HPI above. In addition, no fevers, chills or night sweats. No new headaches or focal weakness. No sore throat or dysphagia.  No abdominal pain, constipation, diarrhea, dysuria or hematuria. No new bone pain. No skin rash or  bleeding symptoms. No new paresthesias in extremities. No polyuria polydipsia. PS ECOG 0.  PAST MEDICAL HISTORY: Reviewed. Past Medical History  Diagnosis Date  . Seasonal allergies   . Unspecified essential hypertension   . Asthma   . 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 Dry eyes 2012  . Breast screening, unspecified   . Lump or mass in breast   . Special screening for malignant neoplasms, colon   . Obesity, unspecified   . Screening for obesity   . Breast cyst 2010    cyst- drained  . Constipation   . Kidney stone   . GERD (gastroesophageal reflux disease)   . Anemia   . Malignant neoplasm of breast (female), unspecified site 07/08/2014    Right breast, 5 mm, T1a,N0, triple negative; high risk for recurrence on Mammoprint.  . Malignant neoplasm of upper-outer quadrant of female breast 05/08/2010    Left breast: T1b, N0, M); triple negative, DCIS present. No chemotherapy. MammoSite radiation.  . Breast cancer     PAST SURGICAL HISTORY: Reviewed. Past Surgical History  Procedure Laterality Date  . Cesarean section  1988  . Colonoscopy  2011    Dr. Dionne Milo  . Breast mammosite  April 2012    mammosite placement and removal   . Kidney stone surgery  2003    stones removed by Dr. Quillian Quince- stent placed/removed  . Tubal ligation    . Breast lumpectomy with axillary  lymph node biopsy Right 08/01/2014    Procedure: BREAST LUMPECTOMY WITH AXILLARY LYMPH NODE BIOPSY;  Surgeon: Robert Bellow, MD;  Location: [facility] ORS;  Service: General;  Laterality: Right;  . Sentinel node biopsy Right 08/01/2014    Procedure: SENTINEL NODE BIOPSY;  Surgeon: Robert Bellow, MD;  Location: [facility] ORS;  Service: General;  Laterality: Right;  . Breast surgery Left 2012    left breast wide excision, sentinel  node, MammoSite  . Breast cyst aspiration  2010  . Breast surgery Right 08/06/2014    Wide excision/sentinel node biopsy, T1a, N0 triple negative, MammoSite  . Portacath placement Left 11/09/2014    Procedure: INSERTION PORT-A-CATH;  Surgeon: Robert Bellow, MD;  Location: [facility] ORS;  Service: General;  Laterality: Left;    FAMILY HISTORY: Reviewed. Family History  Problem Relation Age of Onset  . Ovarian cancer Mother     SOCIAL HISTORY: Reviewed. Social History  Substance Use Topics  . Smoking status: Never Smoker   . Smokeless tobacco: Never Used  . Alcohol Use: Yes     Comment: occasionally    Allergies  Allergen Reactions  . Shellfish Allergy Other (See Comments)    Hives and swelling on ears and feet  . Tape Rash    Surgical tape from breast biopsy    Current Outpatient Prescriptions  Medication Sig Dispense Refill  . albuterol (PROVENTIL HFA;VENTOLIN HFA) 108 (90 BASE) MCG/ACT inhaler Inhale 2 puffs into the lungs every 6 (six) hours as needed for wheezing or shortness of breath.    Marland Kitchen aspirin 81 MG tablet Take 81 mg by mouth daily.    Marland Kitchen b complex vitamins tablet Take 1 tablet by mouth daily.    . Cholecalciferol (VITAMIN D3) 1000 UNITS CAPS Take 1 capsule by mouth daily.     Marland Kitchen esomeprazole (NEXIUM) 40 MG capsule Take 40 mg by mouth 2 (two) times daily as needed (acid reflux).     . fexofenadine (ALLEGRA) 180 MG tablet Take 180 mg by mouth daily as needed for allergies.     Marland Kitchen HYDROcodone-acetaminophen (NORCO) 5-325 MG per tablet Take 1-2 tablets by mouth every 4 (four) hours as needed for moderate pain or severe pain. 30 tablet 0  . lidocaine-prilocaine (EMLA) cream Apply cream 1 hour before chemotherapy treatment, place saran wrap over the cream to avoid getting on clothes. 30 g 1  . POTASSIUM PO Take 1,000 Units by mouth daily.    . hydrochlorothiazide (HYDRODIURIL) 25 MG tablet Take 12.5 mg by mouth daily.     . promethazine (PHENERGAN) 25 MG tablet Take 1 tablet  (25 mg total) by mouth every 6 (six) hours as needed for nausea or vomiting. 60 tablet 2   No current facility-administered medications for this visit.   Facility-Administered Medications Ordered in Other Visits  Medication Dose Route Frequency Provider Last Rate Last Dose  . heparin lock flush 100 unit/mL  500 Units Intravenous Once Leia Alf, MD      . sodium chloride 0.9 % injection 10 mL  10 mL Intravenous PRN Leia Alf, MD   10 mL at 11/10/14 1138    PHYSICAL EXAM: Filed Vitals:   11/24/14 0900  BP: 136/79  Pulse: 111  Temp: 97.5 F (36.4 C)  Resp: 18     Body mass index is 34.3 kg/(m^2).    ECOG FS:0 - Asymptomatic  GENERAL: Patient is alert and oriented and in no acute distress. No icterus. HEENT: EOMs intact. Oral exam negative for thrush  or lesions. No cervical lymphadenopathy. CVS: S1S2, regular LUNGS: Bilaterally clear to auscultation, no rhonchi. ABDOMEN: Soft, nontender. No hepatomegaly  EXTREMITIES: No pedal edema.  LAB RESULTS:    Component Value Date/Time   NA 137 11/24/2014 0833   NA 140 07/16/2011 2130   K 3.3* 11/24/2014 0833   K 3.7 07/16/2011 2130   CL 103 11/24/2014 0833   CL 106 07/16/2011 2130   CO2 26 11/24/2014 0833   CO2 27 07/16/2011 2130   GLUCOSE 194* 11/24/2014 0833   GLUCOSE 93 07/16/2011 2130   BUN 24* 11/24/2014 0833   BUN 16 07/16/2011 2130   CREATININE 1.01* 11/24/2014 0833   CREATININE 0.95 07/16/2011 2130   CALCIUM 8.8* 11/24/2014 0833   CALCIUM 9.0 07/16/2011 2130   PROT 7.7 11/24/2014 0833   ALBUMIN 3.7 11/24/2014 0833   AST 29 11/24/2014 0833   ALT 17 11/24/2014 0833   ALKPHOS 82 11/24/2014 0833   BILITOT 0.4 11/24/2014 0833   GFRNONAA >60 11/24/2014 0833   GFRNONAA >60 07/16/2011 2130   GFRAA >60 11/24/2014 0833   GFRAA >60 07/16/2011 2130   Lab Results  Component Value Date   WBC 6.1 11/24/2014   NEUTROABS 4.2 11/24/2014   HGB 10.8* 11/24/2014   HCT 32.8* 11/24/2014   MCV 93.3 11/24/2014   PLT 206  11/24/2014  ANC 4.2.  STUDIES: 08/01/14 - Right breast lumpectomy and sentinel node study. Pathology report. DIAGNOSIS:  A. LYMPH NODE, SENTINEL; EXCISION:  - NEGATIVE FOR MALIGNANCY (0/1).  B. BREAST, RIGHT; WIDE EXCISION:  - CARCINOMA WITH MEDULLARY FEATURES.  - ADJACENT BIOPSY SITE CHANGE.  - SEE CANCER STAGING SUMMARY BELOW.  INVASIVE CARCINOMA OF THE BREAST: Complete Excision (Less Than Total  Mastectomy, Including Specimens Designated Biopsy, Lumpectomy,  Quadrantectomy, and Partial Mastectomy With or Without Axillary  Contents) and Mastectomy (Total, Modified Radical, Radical With or  Without Axillary Contents)Radical, Radical With or Without Axillary  Contents)  Breast Invasive Carcinoma Cancer Case Summary  SPECIMEN  Procedure:  Excision without wire-guided localization  Lymph Node Sampling:   Sentinel lymph node(s)  Specimen Laterality:   Right  TUMOR  Presence of Invasive Carcinoma:  Histologic Type of Invasive  Carcinoma:  Invasive medullary carcinoma  Histologic Grade: Nottingham Histologic Score  Glandular (Acinar) / Tubular Differentiation:  Score 3: < 10% of tumor area forming glandular / tubular structures  Nuclear Pleomorphism:  Score 3: Vesicular nuclei, often with prominent nucleoli, exhibiting  marked variation in size and shape, occasionally with very large and  bizarre forms  Mitotic Rate  Score 3 (>=8 mitoses per mm2)  Overall Grade: Grade 3: scores of 8 or 9  Ductal Carcinoma In Situ (DCIS):  No DCIS is present  EXTENT  Tumor Size / Focality:  Tumor Size: Size of Largest Invasive Carcinoma:  Greatest dimension of largest focus of invasion > 1 mm  Greatest Dimension (mm): 0.5cm  MARGINS  Invasive Carcinoma: Margins uninvolved by invasive carcinoma  Distance from Closest Margin (mm):  Distance (specify in mm) 58m  Ductal Carcinoma In Situ (DCIS):  DCIS not present in specimen  LYMPH NODES  Status of Lymph Nodes:  Total Number of  Lymph Nodes Examined:  Specify 1  Micro / Macro Metastases:   Not identified  Number of Lymph Nodes with Isolated Tumor Cells (<= 0.2 mm and <= 200 cells):  None  Number of Sentinel Lymph Nodes Examined:   Specify 1  ACCESSORY FINDINGS  Lymph-Vascular Invasion: Not Identified  STAGE (pTNM)  TNM Descriptors:  not applicable  Primary Tumor (Invasive Carcinoma) (pT):  pT1a: Tumor > 1 mm but <= 5 mm in greatest dimension  Regional Lymph Nodes (pN) (choose a category based on lymph nodes  received with the specimen; immunohistochemistry and / or molecular  studies are not required)  Category (pN): pN0: No regional lymph node metastasis identified  histologically  Distant Metastasis (pM): not applicable  BREAST BIOMARKER TESTS:  Test(s) Performed: Estrogen Receptor (ER) Results: NEGATIVE  Progesterone Receptor (PgR) Results: NEGATIVE  Internal controls present and PgR positive (as expected)  HER2 (ERBB2) by Immunohistochemistry (IHC) Results: Negative (Score 1+). Percentage of Cells with Uniform Intense Complete Membrane Staining: 0% .     ASSESSMENT / PLAN:   1. 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 Surgical pathology reports tumor size 0.5 cm, grade 3, margins uninvolved by invasive carcinoma. One sentinel lymph node negative. ER negative, PR negative, HER-2/neu negative (1+ on IHC). 2. 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  Tumor size 0.7 cm, grade 3, margins negative (closest margin is 0.8 mm to anterior).  1 sentinel lymph node negative.  ER negative (0 - 5%), PR negative (<1 - 5%) and HER-2/neu negative by FISH.  08/01/14 - Mammaprint Recurrence Assay - High Risk (Molecular Subtype is Basal-Type) score of -0.814 (5 year risk is 22%, 10 year risk is 29%).  -    Reviewed labs  and discussed with patient, also again explained the rationale, overall benefits and possible side effects of adjuvant chemotherapy with Adriamycin/Cytoxan every 3 weeks 4 cycles followed by weekly Taxol 12 doses. Patient is agreeable to pursue this treatment and expressed verbal consent. Blood counts are adequate, will start cycle 1 chemotherapy with Adriamycin 60 mg/m IV and Cytoxan 600 mg/m2 IV. Neulasta 6 mg subcutaneous at 24 hours after completion of chemotherapy, given high risk of myelosuppression and febrile neutropenia (20% or higher). Monitor weekly labs and follow-up in 3 weeks to plan cycle 2 treatment.   3. Personal history of breast cancer 2, family history of ovarian cancer - BRCA 1 and 2 on 09/22/14 reported Negative.  4. Anemia - mild, no symptoms. Will check iron level, B12, folate, SPEP, DAT test next week when we draw labs. Patient advised to take ferrous sulfate OTC 1 tablet daily.  5. In between visits, she was advised to call in case of any additional questions or concerns. Patient is agreeable to this plan.   Leia Alf, MD   11/24/2014 9:31 AM

## 2014-11-24 NOTE — Progress Notes (Signed)
Patient is here for follow-up and chemo treatment. Patient states that she has been feeling well and has no complaints today.

## 2014-11-28 ENCOUNTER — Telehealth: Payer: Self-pay | Admitting: *Deleted

## 2014-11-28 NOTE — Telephone Encounter (Signed)
Pt coming for lab 9/23 and wanted to know if she will have it through her port. Also what injection is she having. Called pt back and told that she will only have the port accessed if she gets treatment of if she was having some kind of sx from side effects of chemo and may need fluids or atb. But when she is for lab only she will need to get blood work from her arm.  The injection she will get is flu shot.  Dr. Ma Hillock did not want her to have the new chemo and flu shot on same day in case she had reaction we would not know which one caused it. She is fine with this she just needed the info

## 2014-12-01 ENCOUNTER — Telehealth: Payer: Self-pay | Admitting: *Deleted

## 2014-12-01 ENCOUNTER — Inpatient Hospital Stay: Payer: 59

## 2014-12-01 DIAGNOSIS — R3129 Other microscopic hematuria: Secondary | ICD-10-CM

## 2014-12-01 DIAGNOSIS — D649 Anemia, unspecified: Secondary | ICD-10-CM

## 2014-12-01 DIAGNOSIS — C50912 Malignant neoplasm of unspecified site of left female breast: Secondary | ICD-10-CM

## 2014-12-01 DIAGNOSIS — N200 Calculus of kidney: Secondary | ICD-10-CM

## 2014-12-01 DIAGNOSIS — K869 Disease of pancreas, unspecified: Secondary | ICD-10-CM

## 2014-12-01 DIAGNOSIS — C50911 Malignant neoplasm of unspecified site of right female breast: Secondary | ICD-10-CM | POA: Diagnosis not present

## 2014-12-01 LAB — IRON AND TIBC
IRON: 197 ug/dL — AB (ref 28–170)
SATURATION RATIOS: 64 % — AB (ref 10.4–31.8)
TIBC: 310 ug/dL (ref 250–450)
UIBC: 113 ug/dL

## 2014-12-01 LAB — CBC WITH DIFFERENTIAL/PLATELET
BASOS PCT: 0 %
Basophils Absolute: 0 10*3/uL (ref 0–0.1)
EOS ABS: 0 10*3/uL (ref 0–0.7)
Eosinophils Relative: 3 %
HEMATOCRIT: 31.8 % — AB (ref 35.0–47.0)
HEMOGLOBIN: 10.8 g/dL — AB (ref 12.0–16.0)
LYMPHS PCT: 78 %
Lymphs Abs: 0.4 10*3/uL — ABNORMAL LOW (ref 1.0–3.6)
MCH: 32 pg (ref 26.0–34.0)
MCHC: 33.8 g/dL (ref 32.0–36.0)
MCV: 94.6 fL (ref 80.0–100.0)
MONOS PCT: 19 %
Monocytes Absolute: 0.1 10*3/uL — ABNORMAL LOW (ref 0.2–0.9)
NEUTROS PCT: 0 %
Neutro Abs: 0 10*3/uL — ABNORMAL LOW (ref 1.7–7.7)
Platelets: 90 10*3/uL — ABNORMAL LOW (ref 150–440)
RBC: 3.37 MIL/uL — ABNORMAL LOW (ref 3.80–5.20)
RDW: 13.8 % (ref 11.5–14.5)
WBC: 0.5 10*3/uL — CL (ref 3.6–11.0)

## 2014-12-01 LAB — VITAMIN B12: Vitamin B-12: 808 pg/mL (ref 180–914)

## 2014-12-01 LAB — DAT, POLYSPECIFIC AHG (ARMC ONLY): POLYSPECIFIC AHG TEST: NEGATIVE

## 2014-12-01 LAB — FOLATE: FOLATE: 33 ng/mL (ref >5.9)

## 2014-12-01 LAB — FERRITIN: Ferritin: 279 ng/mL (ref 11–307)

## 2014-12-01 MED ORDER — LEVOFLOXACIN 500 MG PO TABS: 500.0000 mg | ORAL_TABLET | Freq: Every day | ORAL | Status: DC

## 2014-12-01 MED ORDER — FLUCONAZOLE 100 MG PO TABS: 100.0000 mg | ORAL_TABLET | Freq: Every day | ORAL | Status: DC

## 2014-12-01 NOTE — Progress Notes (Signed)
ANC: 0. MD, Dr. Ma Hillock, notified via telephone and aware, orders received: Patient will not receive Influenza vaccine today.

## 2014-12-01 NOTE — Telephone Encounter (Signed)
Pt here for poss. Flu shot, today is 7 days from chemo and neut-0.  Went over neutropenic precautions and gave her masks to wear if she goes out in crowded places.  Instructed pt that we will call in atb to help protect her until her counts go back up since she got neulasta shot.  She states that she always gets yeast infection every time she is on atb.  Spoke to pandit and we will send in rx for levaquin and diflucan.  Pt understands instructions and agreeable to plan. Both atb sent in for patient

## 2014-12-04 LAB — PROTEIN ELECTROPHORESIS, SERUM
A/G RATIO SPE: 1 (ref 0.7–1.7)
ALPHA-1-GLOBULIN: 0.2 g/dL (ref 0.0–0.4)
ALPHA-2-GLOBULIN: 0.8 g/dL (ref 0.4–1.0)
Albumin ELP: 3.6 g/dL (ref 2.9–4.4)
Beta Globulin: 1.2 g/dL (ref 0.7–1.3)
GLOBULIN, TOTAL: 3.5 g/dL (ref 2.2–3.9)
Gamma Globulin: 1.4 g/dL (ref 0.4–1.8)
M-Spike, %: 0.8 g/dL — ABNORMAL HIGH
Total Protein ELP: 7.1 g/dL (ref 6.0–8.5)

## 2014-12-08 ENCOUNTER — Inpatient Hospital Stay: Payer: 59

## 2014-12-08 ENCOUNTER — Other Ambulatory Visit: Payer: Self-pay | Admitting: Internal Medicine

## 2014-12-08 DIAGNOSIS — C50911 Malignant neoplasm of unspecified site of right female breast: Secondary | ICD-10-CM | POA: Diagnosis not present

## 2014-12-08 DIAGNOSIS — D649 Anemia, unspecified: Secondary | ICD-10-CM

## 2014-12-08 DIAGNOSIS — C50912 Malignant neoplasm of unspecified site of left female breast: Secondary | ICD-10-CM

## 2014-12-08 DIAGNOSIS — D472 Monoclonal gammopathy: Secondary | ICD-10-CM

## 2014-12-08 LAB — CBC WITH DIFFERENTIAL/PLATELET
BASOS ABS: 0 10*3/uL (ref 0–0.1)
Basophils Relative: 0 %
EOS ABS: 0 10*3/uL (ref 0–0.7)
EOS PCT: 0 %
HCT: 30 % — ABNORMAL LOW (ref 35.0–47.0)
Hemoglobin: 10.1 g/dL — ABNORMAL LOW (ref 12.0–16.0)
LYMPHS PCT: 12 %
Lymphs Abs: 1.3 10*3/uL (ref 1.0–3.6)
MCH: 31.1 pg (ref 26.0–34.0)
MCHC: 33.7 g/dL (ref 32.0–36.0)
MCV: 92.3 fL (ref 80.0–100.0)
MONO ABS: 1.3 10*3/uL — AB (ref 0.2–0.9)
Monocytes Relative: 12 %
Neutro Abs: 8.3 10*3/uL — ABNORMAL HIGH (ref 1.4–6.5)
Neutrophils Relative %: 76 %
PLATELETS: 168 10*3/uL (ref 150–440)
RBC: 3.25 MIL/uL — ABNORMAL LOW (ref 3.80–5.20)
RDW: 13.6 % (ref 11.5–14.5)
WBC: 11 10*3/uL (ref 3.6–11.0)

## 2014-12-08 LAB — BASIC METABOLIC PANEL
ANION GAP: 6 (ref 5–15)
BUN: 21 mg/dL — AB (ref 6–20)
CALCIUM: 8.7 mg/dL — AB (ref 8.9–10.3)
CO2: 28 mmol/L (ref 22–32)
Chloride: 101 mmol/L (ref 101–111)
Creatinine, Ser: 1.04 mg/dL — ABNORMAL HIGH (ref 0.44–1.00)
GFR calc Af Amer: >60 mL/min (ref >60)
GFR, EST NON AFRICAN AMERICAN: 59 mL/min — AB (ref >60)
GLUCOSE: 86 mg/dL (ref 65–99)
Potassium: 3.2 mmol/L — ABNORMAL LOW (ref 3.5–5.1)
Sodium: 135 mmol/L (ref 135–145)

## 2014-12-08 LAB — HEPATIC FUNCTION PANEL
ALT: 15 U/L (ref 14–54)
AST: 19 U/L (ref 15–41)
Albumin: 3.9 g/dL (ref 3.5–5.0)
Alkaline Phosphatase: 84 U/L (ref 38–126)
Total Bilirubin: 0.3 mg/dL (ref 0.3–1.2)
Total Protein: 7.8 g/dL (ref 6.5–8.1)

## 2014-12-08 MED ORDER — POTASSIUM CHLORIDE CRYS ER 20 MEQ PO TBCR: 20.0000 meq | EXTENDED_RELEASE_TABLET | Freq: Every day | ORAL | Status: DC

## 2014-12-08 NOTE — Progress Notes (Signed)
Anemia w/u last week shows M-spike of 0.8 g/dL, also had mild anemia of 10.8 prior to starting chemotherapy. Otherwise no history of renal insufficiency on hypercalcemia. She has intermittent mild neck pain but this has been since getting Neulasta dose. 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 Will also check serum kappa/lambda light chains when she comes back next week. Will also get skeletal X-ray survey to look for lytic bone lesions (will request for October 5 when she goes for CT abdomen requested by urologist for follow-up of kidney stones). Patient is agreeable to this plan. Keep other scheduled appointments the same.

## 2014-12-13 ENCOUNTER — Ambulatory Visit
Admission: RE | Admit: 2014-12-13 | Discharge: 2014-12-13 | Disposition: A | Discharge status: Home / Self Care | Payer: 59 | Source: Ambulatory Visit | Attending: Urology | Admitting: Urology

## 2014-12-13 ENCOUNTER — Ambulatory Visit
Admission: RE | Admit: 2014-12-13 | Discharge: 2014-12-13 | Disposition: A | Discharge status: Home / Self Care | Payer: 59 | Source: Ambulatory Visit | Attending: Internal Medicine | Admitting: Internal Medicine

## 2014-12-13 DIAGNOSIS — D472 Monoclonal gammopathy: Secondary | ICD-10-CM | POA: Insufficient documentation

## 2014-12-13 DIAGNOSIS — N200 Calculus of kidney: Secondary | ICD-10-CM | POA: Insufficient documentation

## 2014-12-13 DIAGNOSIS — R3129 Other microscopic hematuria: Secondary | ICD-10-CM | POA: Insufficient documentation

## 2014-12-13 DIAGNOSIS — N281 Cyst of kidney, acquired: Secondary | ICD-10-CM | POA: Insufficient documentation

## 2014-12-13 DIAGNOSIS — C50912 Malignant neoplasm of unspecified site of left female breast: Secondary | ICD-10-CM | POA: Diagnosis present

## 2014-12-13 MED ORDER — IOHEXOL 350 MG/ML SOLN
100.0000 mL | Freq: Once | INTRAVENOUS | Status: AC | PRN
  Administered 2014-12-13: 100 mL via INTRAVENOUS

## 2014-12-15 ENCOUNTER — Inpatient Hospital Stay (HOSPITAL_BASED_OUTPATIENT_CLINIC_OR_DEPARTMENT_OTHER): Payer: 59 | Admitting: Internal Medicine

## 2014-12-15 ENCOUNTER — Inpatient Hospital Stay: Payer: 59 | Attending: Internal Medicine

## 2014-12-15 ENCOUNTER — Inpatient Hospital Stay: Payer: 59

## 2014-12-15 VITALS — BP 113/65 | HR 86 | Temp 96.6°F | Resp 18 | Ht 65.0 in | Wt 206.6 lb

## 2014-12-15 DIAGNOSIS — E876 Hypokalemia: Secondary | ICD-10-CM

## 2014-12-15 DIAGNOSIS — D472 Monoclonal gammopathy: Secondary | ICD-10-CM

## 2014-12-15 DIAGNOSIS — C50911 Malignant neoplasm of unspecified site of right female breast: Secondary | ICD-10-CM

## 2014-12-15 DIAGNOSIS — Z7982 Long term (current) use of aspirin: Secondary | ICD-10-CM | POA: Insufficient documentation

## 2014-12-15 DIAGNOSIS — Z171 Estrogen receptor negative status [ER-]: Secondary | ICD-10-CM | POA: Diagnosis not present

## 2014-12-15 DIAGNOSIS — Z418 Encounter for other procedures for purposes other than remedying health state: Secondary | ICD-10-CM | POA: Diagnosis not present

## 2014-12-15 DIAGNOSIS — Z79899 Other long term (current) drug therapy: Secondary | ICD-10-CM | POA: Insufficient documentation

## 2014-12-15 DIAGNOSIS — I10 Essential (primary) hypertension: Secondary | ICD-10-CM | POA: Insufficient documentation

## 2014-12-15 DIAGNOSIS — D649 Anemia, unspecified: Secondary | ICD-10-CM

## 2014-12-15 DIAGNOSIS — E669 Obesity, unspecified: Secondary | ICD-10-CM | POA: Insufficient documentation

## 2014-12-15 DIAGNOSIS — Z5111 Encounter for antineoplastic chemotherapy: Secondary | ICD-10-CM | POA: Insufficient documentation

## 2014-12-15 DIAGNOSIS — C50912 Malignant neoplasm of unspecified site of left female breast: Secondary | ICD-10-CM

## 2014-12-15 DIAGNOSIS — K219 Gastro-esophageal reflux disease without esophagitis: Secondary | ICD-10-CM | POA: Diagnosis not present

## 2014-12-15 DIAGNOSIS — Z853 Personal history of malignant neoplasm of breast: Secondary | ICD-10-CM | POA: Diagnosis not present

## 2014-12-15 DIAGNOSIS — J45909 Unspecified asthma, uncomplicated: Secondary | ICD-10-CM | POA: Insufficient documentation

## 2014-12-15 DIAGNOSIS — Z23 Encounter for immunization: Secondary | ICD-10-CM | POA: Insufficient documentation

## 2014-12-15 LAB — CBC WITH DIFFERENTIAL/PLATELET
BASOS ABS: 0.1 10*3/uL (ref 0–0.1)
BASOS PCT: 1 %
EOS PCT: 1 %
Eosinophils Absolute: 0 10*3/uL (ref 0–0.7)
HEMATOCRIT: 29.5 % — AB (ref 35.0–47.0)
Hemoglobin: 10.1 g/dL — ABNORMAL LOW (ref 12.0–16.0)
LYMPHS PCT: 13 %
Lymphs Abs: 1 10*3/uL (ref 1.0–3.6)
MCH: 31.1 pg (ref 26.0–34.0)
MCHC: 34.1 g/dL (ref 32.0–36.0)
MCV: 91.4 fL (ref 80.0–100.0)
MONO ABS: 0.8 10*3/uL (ref 0.2–0.9)
MONOS PCT: 10 %
NEUTROS ABS: 5.9 10*3/uL (ref 1.4–6.5)
Neutrophils Relative %: 75 %
PLATELETS: 322 10*3/uL (ref 150–440)
RBC: 3.23 MIL/uL — ABNORMAL LOW (ref 3.80–5.20)
RDW: 14.2 % (ref 11.5–14.5)
WBC: 7.8 10*3/uL (ref 3.6–11.0)

## 2014-12-15 LAB — POTASSIUM: POTASSIUM: 3.4 mmol/L — AB (ref 3.5–5.1)

## 2014-12-15 MED ORDER — HEPARIN SOD (PORK) LOCK FLUSH 100 UNIT/ML IV SOLN
500.0000 [IU] | Freq: Once | INTRAVENOUS | Status: AC
  Administered 2014-12-15: 500 [IU] via INTRAVENOUS
  Filled 2014-12-15: qty 5

## 2014-12-15 MED ORDER — POTASSIUM CHLORIDE CRYS ER 20 MEQ PO TBCR
20.0000 meq | EXTENDED_RELEASE_TABLET | Freq: Once | ORAL | Status: AC
Start: 2014-12-15 — End: 2014-12-15
  Administered 2014-12-15: 20 meq via ORAL
  Filled 2014-12-15: qty 1

## 2014-12-15 MED ORDER — SODIUM CHLORIDE 0.9 % IV SOLN
Freq: Once | INTRAVENOUS | Status: AC
  Administered 2014-12-15: 10:00:00 via INTRAVENOUS
  Filled 2014-12-15: qty 1000

## 2014-12-15 MED ORDER — DOXORUBICIN HCL CHEMO IV INJECTION 2 MG/ML
60.0000 mg/m2 | Freq: Once | INTRAVENOUS | Status: AC
  Administered 2014-12-15: 124 mg via INTRAVENOUS
  Filled 2014-12-15: qty 62

## 2014-12-15 MED ORDER — CYCLOPHOSPHAMIDE CHEMO INJECTION 1 GM
600.0000 mg/m2 | Freq: Once | INTRAMUSCULAR | Status: AC
  Administered 2014-12-15: 1240 mg via INTRAVENOUS
  Filled 2014-12-15: qty 50

## 2014-12-15 MED ORDER — PEGFILGRASTIM 6 MG/0.6ML ~~LOC~~ PSKT
6.0000 mg | PREFILLED_SYRINGE | Freq: Once | SUBCUTANEOUS | Status: AC
  Administered 2014-12-15: 6 mg via SUBCUTANEOUS
  Filled 2014-12-15: qty 0.6

## 2014-12-15 MED ORDER — INFLUENZA VAC SPLIT QUAD 0.5 ML IM SUSY
0.5000 mL | PREFILLED_SYRINGE | Freq: Once | INTRAMUSCULAR | Status: AC
  Administered 2014-12-15: 0.5 mL via INTRAMUSCULAR
  Filled 2014-12-15: qty 0.5

## 2014-12-15 MED ORDER — SODIUM CHLORIDE 0.9 % IV SOLN
Freq: Once | INTRAVENOUS | Status: AC
  Administered 2014-12-15: 10:00:00 via INTRAVENOUS
  Filled 2014-12-15: qty 5

## 2014-12-15 MED ORDER — PALONOSETRON HCL INJECTION 0.25 MG/5ML
0.2500 mg | Freq: Once | INTRAVENOUS | Status: AC
  Administered 2014-12-15: 0.25 mg via INTRAVENOUS
  Filled 2014-12-15: qty 5

## 2014-12-15 MED ORDER — SODIUM CHLORIDE 0.9 % IJ SOLN
10.0000 mL | INTRAMUSCULAR | Status: DC | PRN
  Administered 2014-12-15: 10 mL via INTRAVENOUS
  Filled 2014-12-15: qty 10

## 2014-12-15 NOTE — Progress Notes (Signed)
Patient is here for follow-up of breast cancer and AC treatment. Patient states that she has been doing good and offers no complaints today. She states that her appetite has been very good.

## 2014-12-15 NOTE — Progress Notes (Signed)
[place]  Telephone:([phone]) [phone] Fax:(336) [phone]     ID: Adrienne [name] OB: [birth year]  MR#: [id number]  FYB#:[id number]  Patient Care Team: Guadalupe Maple, MD as PCP - General (Unknown Physician Specialty) Robert Bellow, MD (General Surgery) Guadalupe Maple, MD (Family Medicine)  CHIEF COMPLAINT/DIAGNOSIS:  1. 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 Surgical pathology reports tumor size 0.5 cm, grade 3, margins uninvolved by invasive carcinoma. One sentinel lymph node negative. ER negative, PR negative, HER-2/neu negative (1+ on IHC). 08/01/14 - Mammaprint Recurrence Assay - High Risk (Molecular Subtype is Basal-Type) score of -0.814 (5 year risk is 22%, 10 year risk is 29%).  Started adjuvant chemotherapy on 11/24/14 (planned for [facility] 4 followed by weekly Taxol 12).   2. Anemia, mild - preceded chemotherapy. 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 Skeletal x-ray survey negative for lytic lesions. Serum kappa and lambda light chains pending.  3. 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  Tumor size 0.7 cm, grade 3, margins negative (closest margin is 0.8 mm to anterior).  1 sentinel lymph node negative.  ER negative (0 - 5%), PR negative (<1 - 5%) and HER-2/neu negative by FISH.   HISTORY OF PRESENT ILLNESS:  Patient returns for continued oncology follow-up and planned cycle 2 chemotherapy. States that she had some fatigability of cycle 1, otherwise tolerated treatment without major side effects. No new bone pains or joint pains. Eating steady. No nausea or vomiting. No diarrhea. No bleeding symptoms. Remains physically active and ambulatory. No new cough, dyspnea or orthopnea.   REVIEW OF SYSTEMS:     ROS As in HPI above. In addition, no fevers or night sweats. No new headaches or focal weakness. No sore throat or dysphagia.  No abdominal pain, constipation, diarrhea, dysuria or hematuria. No new bone pain. No skin rash or bleeding symptoms. No new paresthesias in extremities. No polyuria polydipsia. PS ECOG 0.  PAST MEDICAL HISTORY: Reviewed. Past Medical History  Diagnosis Date  . Seasonal allergies   . Unspecified essential hypertension   . Asthma   . 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 Dry eyes 2012  . Breast screening, unspecified   . Lump or mass in breast   . Special screening for malignant neoplasms, colon   . Obesity, unspecified   . Screening for obesity   . Breast cyst 2010    cyst- drained  . Constipation   . Kidney stone   . GERD (gastroesophageal reflux disease)   . Anemia   . Malignant neoplasm of breast (female), unspecified site 07/08/2014    Right breast, 5 mm, T1a,N0, triple negative; high risk for recurrence on Mammoprint.  . Malignant neoplasm of upper-outer quadrant of female breast ([place]) 05/08/2010    Left breast: T1b, N0, M); triple negative, DCIS present. No chemotherapy. MammoSite radiation.  . Breast cancer ([place])     PAST SURGICAL HISTORY: Reviewed. Past Surgical History  Procedure Laterality Date  . Cesarean section  1988  . Colonoscopy  2011    Dr. Dionne Milo  . Breast mammosite  April 2012    mammosite placement and removal   . Kidney stone surgery  2003    stones removed by Dr. Quillian Quince- stent placed/removed  . Tubal ligation    . Breast lumpectomy with axillary lymph node biopsy Right 08/01/2014    Procedure: BREAST LUMPECTOMY WITH AXILLARY LYMPH NODE BIOPSY;  Surgeon: Robert Bellow, MD;  Location: [facility] ORS;  Service: General;  Laterality: Right;   . Sentinel node biopsy Right 08/01/2014    Procedure: SENTINEL NODE BIOPSY;  Surgeon: Robert Bellow, MD;  Location: [facility] ORS;  Service: General;  Laterality: Right;  . Breast surgery Left 2012    left breast wide excision, sentinel node, MammoSite  . Breast cyst aspiration  2010  . Breast surgery Right 08/06/2014    Wide excision/sentinel node biopsy, T1a, N0 triple negative, MammoSite  . Portacath placement Left 11/09/2014    Procedure: INSERTION PORT-A-CATH;  Surgeon: Robert Bellow, MD;  Location: [facility] ORS;  Service: General;  Laterality: Left;    FAMILY HISTORY: Reviewed. Family History  Problem Relation Age of Onset  . Ovarian cancer Mother     SOCIAL HISTORY: Reviewed. Social History  Substance Use Topics  . Smoking status: Never Smoker   . Smokeless tobacco: Never Used  . Alcohol Use: Yes     Comment: occasionally    Allergies  Allergen Reactions  . Shellfish Allergy Other (See Comments)    Hives and swelling on ears and feet  . Tape Rash    Surgical tape from breast biopsy    Current Outpatient Prescriptions  Medication Sig Dispense Refill  . albuterol (PROVENTIL HFA;VENTOLIN HFA) 108 (90 BASE) MCG/ACT inhaler Inhale 2 puffs into the lungs every 6 (six) hours as needed for wheezing or shortness of breath.    Marland Kitchen aspirin 81 MG tablet Take 81 mg by mouth daily.    Marland Kitchen b complex vitamins tablet Take 1 tablet by mouth daily.    . Cholecalciferol (VITAMIN D3) 1000 UNITS CAPS Take 1 capsule by mouth daily.     Marland Kitchen esomeprazole (NEXIUM) 40 MG capsule Take 40 mg by mouth 2 (two) times daily as needed (acid reflux).     . fexofenadine (ALLEGRA) 180 MG tablet Take 180 mg by mouth daily as needed for allergies.     . fluconazole (DIFLUCAN) 100 MG tablet Take 1 tablet (100 mg total) by mouth daily. 2 tablet 0  . hydrochlorothiazide (HYDRODIURIL) 25 MG tablet Take 12.5 mg by mouth daily.     Marland Kitchen HYDROcodone-acetaminophen (NORCO) 5-325 MG per tablet Take 1-2 tablets by mouth every  4 (four) hours as needed for moderate pain or severe pain. 30 tablet 0  . lidocaine-prilocaine (EMLA) cream Apply cream 1 hour before chemotherapy treatment, place saran wrap over the cream to avoid getting on clothes. 30 g 1  . potassium chloride (K-DUR,KLOR-CON) 20 MEQ tablet Take 1 tablet (20 mEq total) by mouth daily. 7 tablet 0  . promethazine (PHENERGAN) 25 MG tablet Take 1 tablet (25 mg total) by mouth every 6 (six) hours as needed for nausea or vomiting. 60 tablet 2  . levofloxacin (LEVAQUIN) 500 MG tablet Take 1 tablet (500 mg total) by mouth daily. (Patient not taking: Reported on 12/15/2014) 7 tablet 0  . POTASSIUM PO Take 1,000 Units by mouth daily.     Current Facility-Administered Medications  Medication Dose Route Frequency Provider Last Rate Last Dose  . Influenza vac split quadrivalent PF (FLUARIX) injection 0.5 mL  0.5 mL Intramuscular Once Kadance Mccuistion  Ma Hillock, MD       Facility-Administered Medications Ordered in Other Visits  Medication Dose Route Frequency Provider Last Rate Last Dose  . heparin lock flush 100 unit/mL  500 Units Intravenous Once Leia Alf, MD      . sodium chloride 0.9 % injection 10 mL  10 mL Intravenous PRN Leia Alf, MD   10 mL at 11/10/14 1138  . sodium chloride 0.9 % injection 10 mL  10 mL Intravenous PRN Leia Alf, MD   10 mL at 12/15/14 0837    PHYSICAL EXAM: Filed Vitals:   12/15/14 0852  BP: 113/65  Pulse: 86  Temp: 96.6 F (35.9 C)  Resp: 18     Body mass index is 34.38 kg/(m^2).    ECOG FS:0 - Asymptomatic  GENERAL: Alert and oriented and in no acute distress. No icterus. HEENT: EOMs intact. Oral exam negative for thrush or lesions.  CVS: S1S2, regular LUNGS: Bilaterally clear to auscultation, no rhonchi. ABDOMEN: Soft, nontender. No hepatomegaly  EXTREMITIES: No pedal edema. NEURO: Grossly nonfocal, cranial nerves intact. Gait is unremarkable.  LAB RESULTS:    Component Value Date/Time   NA 135 12/08/2014 0843   NA 140  07/16/2011 2130   K 3.4* 12/15/2014 0824   K 3.7 07/16/2011 2130   CL 101 12/08/2014 0843   CL 106 07/16/2011 2130   CO2 28 12/08/2014 0843   CO2 27 07/16/2011 2130   GLUCOSE 86 12/08/2014 0843   GLUCOSE 93 07/16/2011 2130   BUN 21* 12/08/2014 0843   BUN 16 07/16/2011 2130   CREATININE 1.04* 12/08/2014 0843   CREATININE 0.95 07/16/2011 2130   CALCIUM 8.7* 12/08/2014 0843   CALCIUM 9.0 07/16/2011 2130   PROT 7.8 12/08/2014 0843   ALBUMIN 3.9 12/08/2014 0843   AST 19 12/08/2014 0843   ALT 15 12/08/2014 0843   ALKPHOS 84 12/08/2014 0843   BILITOT 0.3 12/08/2014 0843   GFRNONAA 59* 12/08/2014 0843   GFRNONAA >60 07/16/2011 2130   GFRAA >60 12/08/2014 0843   GFRAA >60 07/16/2011 2130   Lab Results  Component Value Date   WBC 7.8 12/15/2014   NEUTROABS 5.9 12/15/2014   HGB 10.1* 12/15/2014   HCT 29.5* 12/15/2014   MCV 91.4 12/15/2014   PLT 322 12/15/2014  ANC 4.2.  STUDIES: 08/01/14 - Right breast lumpectomy and sentinel node study. Pathology report. DIAGNOSIS:  A. LYMPH NODE, SENTINEL; EXCISION:  - NEGATIVE FOR MALIGNANCY (0/1).  B. BREAST, RIGHT; WIDE EXCISION:  - CARCINOMA WITH MEDULLARY FEATURES.  - ADJACENT BIOPSY SITE CHANGE.  - SEE CANCER STAGING SUMMARY BELOW.  INVASIVE CARCINOMA OF THE BREAST: Complete Excision (Less Than Total  Mastectomy, Including Specimens Designated Biopsy, Lumpectomy,  Quadrantectomy, and Partial Mastectomy With or Without Axillary  Contents) and Mastectomy (Total, Modified Radical, Radical With or  Without Axillary Contents)Radical, Radical With or Without Axillary  Contents)  Breast Invasive Carcinoma Cancer Case Summary  SPECIMEN  Procedure:  Excision without wire-guided localization  Lymph Node Sampling:   Sentinel lymph node(s)  Specimen Laterality:   Right  TUMOR  Presence of Invasive Carcinoma:  Histologic Type of Invasive  Carcinoma:  Invasive medullary carcinoma  Histologic Grade: Nottingham Histologic Score    Glandular (Acinar) / Tubular Differentiation:  Score 3: < 10% of tumor area forming glandular / tubular structures  Nuclear Pleomorphism:  Score 3: Vesicular nuclei, often with prominent nucleoli, exhibiting  marked variation in size and shape, occasionally with very large and  bizarre forms  Mitotic Rate  Score 3 (>=8 mitoses per mm2)  Overall Grade: Grade 3: scores of 8 or 9  Ductal Carcinoma In Situ (DCIS):  No DCIS is present  EXTENT  Tumor Size / Focality:  Tumor Size: Size of Largest Invasive Carcinoma:  Greatest dimension of largest focus of invasion > 1 mm  Greatest Dimension (mm): 0.5cm  MARGINS  Invasive Carcinoma: Margins uninvolved by invasive carcinoma  Distance from Closest Margin (mm):  Distance (specify in mm) 70m  Ductal Carcinoma In Situ (DCIS):  DCIS not present in specimen  LYMPH NODES  Status of Lymph Nodes:  Total Number of Lymph Nodes Examined:  Specify 1  Micro / Macro Metastases:   Not identified  Number of Lymph Nodes with Isolated Tumor Cells (<= 0.2 mm and <= 200 cells):  None  Number of Sentinel Lymph Nodes Examined:   Specify 1  ACCESSORY FINDINGS  Lymph-Vascular Invasion: Not Identified  STAGE (pTNM)  TNM Descriptors:  not applicable  Primary Tumor (Invasive Carcinoma) (pT):  pT1a: Tumor > 1 mm but <= 5 mm in greatest dimension  Regional Lymph Nodes (pN) (choose a category based on lymph nodes  received with the specimen; immunohistochemistry and / or molecular  studies are not required)  Category (pN): pN0: No regional lymph node metastasis identified  histologically  Distant Metastasis (pM): not applicable  BREAST BIOMARKER TESTS:  Test(s) Performed: Estrogen Receptor (ER) Results: NEGATIVE  Progesterone Receptor (PgR) Results: NEGATIVE  Internal controls present and PgR positive (as expected)  HER2 (ERBB2) by Immunohistochemistry (IHC) Results: Negative (Score 1+). Percentage of Cells with Uniform Intense Complete  Membrane Staining: 0% .     ASSESSMENT / PLAN:   1. 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 Surgical pathology reports tumor size 0.5 cm, grade 3, margins uninvolved by invasive carcinoma. One sentinel lymph node negative. ER negative, PR negative, HER-2/neu negative (1+ on IHC). 08/01/14 - Mammaprint Recurrence Assay - High Risk (Molecular Subtype is Basal-Type) score of -0.814 (5 year risk is 22%, 10 year risk is 29%). Started adjuvant chemotherapy on 11/24/14 [facility] -   Have reviewed labs and d/w patient. She overall tolerated cycle one chemotherapy without major side effects, agreeable to continue on treatment. Blood counts are adequate, will proceed with cycle 2 chemotherapy with Adriamycin 60 mg/m IV and Cytoxan 600 mg/m2 IV. Neulasta 6 mg subcutaneous at 24 hours after completion of chemotherapy, given high risk of myelosuppression and febrile neutropenia (20% or higher). Monitor weekly labs and follow-up in 3 weeks to plan cycle 3 treatment.   2. 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  Tumor size 0.7 cm, grade 3, margins negative (closest margin is 0.8 mm to anterior).  1 sentinel lymph node negative.  ER negative (0 - 5%), PR negative (<1 - 5%) and HER-2/neu negative by FISH.  3. Personal history of breast cancer 2, family history of ovarian cancer - BRCA 1 and 2 on 09/22/14 reported Negative.  4. Anemia, mild - preceded chemotherapy. 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 Skeletal x-ray survey negative for lytic lesions. Serum kappa and lambda light chains pending, this will need to be followed up. She will need continued monitoring for MGUS with repeat labs in 4-6 months.  5.  Hypokalemia - improving. Will give additional potassium chloride 20 meq by mouth 1 here today.  6. In between visits, she was advised to call in case of any additional questions or concerns. Patient is agreeable to this plan.   Leia Alf, MD   12/15/2014 10:05 AM

## 2014-12-17 LAB — KAPPA/LAMBDA LIGHT CHAINS
KAPPA FREE LGHT CHN: 20.87 mg/L — AB (ref 3.30–19.40)
Kappa, lambda light chain ratio: 1.52 (ref 0.26–1.65)
Lambda free light chains: 13.72 mg/L (ref 5.71–26.30)

## 2014-12-22 ENCOUNTER — Inpatient Hospital Stay: Payer: 59

## 2014-12-22 ENCOUNTER — Telehealth: Payer: Self-pay | Admitting: *Deleted

## 2014-12-22 DIAGNOSIS — C50911 Malignant neoplasm of unspecified site of right female breast: Secondary | ICD-10-CM | POA: Diagnosis not present

## 2014-12-22 DIAGNOSIS — C50912 Malignant neoplasm of unspecified site of left female breast: Secondary | ICD-10-CM

## 2014-12-22 LAB — CBC WITH DIFFERENTIAL/PLATELET
Basophils Absolute: 0 10*3/uL (ref 0–0.1)
Basophils Relative: 1 %
Eosinophils Absolute: 0.1 10*3/uL (ref 0–0.7)
Eosinophils Relative: 10 %
HEMATOCRIT: 26.4 % — AB (ref 35.0–47.0)
HEMOGLOBIN: 9 g/dL — AB (ref 12.0–16.0)
LYMPHS ABS: 0.5 10*3/uL — AB (ref 1.0–3.6)
MCH: 31.3 pg (ref 26.0–34.0)
MCHC: 33.9 g/dL (ref 32.0–36.0)
MCV: 92.2 fL (ref 80.0–100.0)
MONO ABS: 0.1 10*3/uL — AB (ref 0.2–0.9)
NEUTROS ABS: 0.1 10*3/uL — AB (ref 1.4–6.5)
Platelets: 141 10*3/uL — ABNORMAL LOW (ref 150–440)
RBC: 2.87 MIL/uL — ABNORMAL LOW (ref 3.80–5.20)
RDW: 14.7 % — AB (ref 11.5–14.5)
WBC: 0.7 10*3/uL — CL (ref 3.6–11.0)

## 2014-12-22 MED ORDER — LEVOFLOXACIN 500 MG PO TABS: 500.0000 mg | ORAL_TABLET | Freq: Every day | ORAL | Status: DC

## 2014-12-22 NOTE — Telephone Encounter (Signed)
Pt having lots of constipation.  She sits and sits on toilet and tries to get it out and when she gets any it is hard small balls.  I have told her to take otc stool softner 1 a day to 1 bid, and for today take miralax  Dose today and if nothing after 4-5 hours take another dose.  The bottle says can take up to three a day but i dont want her to get diarrhea. So she will have to guide herself as to if she gets relief with 1 dose and if she needs any help today to just call me. She is agreeable to the plan

## 2014-12-22 NOTE — Telephone Encounter (Signed)
Called pt to let her know that the wbc and anc down again last 1 st treatment. Called in same atb as she had last time and went over neutropenic precautions and she still has not had a BM from the miralax and so I advised her to take another dose it has been 5 hours and no BM.

## 2014-12-25 ENCOUNTER — Encounter: Payer: Self-pay | Admitting: Urology

## 2014-12-26 ENCOUNTER — Ambulatory Visit (INDEPENDENT_AMBULATORY_CARE_PROVIDER_SITE_OTHER): Payer: 59 | Admitting: Urology

## 2014-12-26 VITALS — BP 138/67 | HR 106 | Ht 65.0 in | Wt 202.2 lb

## 2014-12-26 DIAGNOSIS — K869 Disease of pancreas, unspecified: Secondary | ICD-10-CM

## 2014-12-26 DIAGNOSIS — N200 Calculus of kidney: Secondary | ICD-10-CM | POA: Insufficient documentation

## 2014-12-26 DIAGNOSIS — K8689 Other specified diseases of pancreas: Secondary | ICD-10-CM | POA: Insufficient documentation

## 2014-12-26 DIAGNOSIS — N281 Cyst of kidney, acquired: Secondary | ICD-10-CM | POA: Insufficient documentation

## 2014-12-26 DIAGNOSIS — Q6100 Congenital renal cyst, unspecified: Secondary | ICD-10-CM

## 2014-12-26 DIAGNOSIS — Z87442 Personal history of urinary calculi: Secondary | ICD-10-CM | POA: Insufficient documentation

## 2014-12-26 LAB — URINALYSIS, COMPLETE
Bilirubin, UA: NEGATIVE
GLUCOSE, UA: NEGATIVE
NITRITE UA: NEGATIVE
Specific Gravity, UA: 1.025 (ref 1.005–1.030)
Urobilinogen, Ur: 0.2 mg/dL (ref 0.2–1.0)
pH, UA: 5.5 (ref 5.0–7.5)

## 2014-12-26 LAB — MICROSCOPIC EXAMINATION
Epithelial Cells (non renal): >10 /hpf — AB (ref 0–10)
Renal Epithel, UA: NONE SEEN /hpf

## 2014-12-26 NOTE — Progress Notes (Signed)
12/26/2014 8:38 AM   Adrienne [name] 04/04/1960 [id number]  Referring provider: Guadalupe Maple, MD [address] [place], [place] [address]  Chief Complaint  Patient presents with  . Nephrolithiasis    31month follow up    HPI:  1 - Nephrolithiasis - incidental LUP 91mm stone w/o obstruction, bilateral punctate stones as well. Stable by CT 12/2014.  2 - Non-Complex Renal Cysts - bilat <1cm scattered non-enhancing cysts incidental on CT 2016. No complex features or mass effect.  3 - Small Incidental Pancreatic Head Mass - 57mm fatty pancreatic head lesion incidetnal on imaging 2015. Completely stable by CT 2016 and felt no further eval warranted.  Today "Adrienne [name]" is seen in f/u above after CT. She is underoging ongoing treatment for triple negative breast cancer. No interval gross hematuria or colic.    PMH: Past Medical History  Diagnosis Date  . Seasonal allergies   . Unspecified essential hypertension   . Asthma   . 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 Dry eyes 2012  . Breast screening, unspecified   . Lump or mass in breast   . Special screening for malignant neoplasms, colon   . Obesity, unspecified   . Screening for obesity   . Breast cyst 2010    cyst- drained  . Constipation   . Kidney stone   . GERD (gastroesophageal reflux disease)   . Anemia   . Malignant neoplasm of breast (female), unspecified site 07/08/2014    Right breast, 5 mm, T1a,N0, triple negative; high risk for recurrence on Mammoprint.  . Malignant neoplasm of upper-outer quadrant of female breast ([place]) 05/08/2010    Left breast: T1b, N0, M); triple negative, DCIS present. No chemotherapy. MammoSite radiation.  . Breast cancer ([place])   . Hematuria   . Pancreatic lesion     Surgical History: Past  Surgical History  Procedure Laterality Date  . Cesarean section  1988  . Colonoscopy  2011    Dr. Dionne Milo  . Breast mammosite  April 2012    mammosite placement and removal   . Kidney stone surgery  2003    stones removed by Dr. Quillian Quince- stent placed/removed  . Tubal ligation    . Breast lumpectomy with axillary lymph node biopsy Right 08/01/2014    Procedure: BREAST LUMPECTOMY WITH AXILLARY LYMPH NODE BIOPSY;  Surgeon: Robert Bellow, MD;  Location: [facility] ORS;  Service: General;  Laterality: Right;  . Sentinel node biopsy Right 08/01/2014    Procedure: SENTINEL NODE BIOPSY;  Surgeon: Robert Bellow, MD;  Location: [facility] ORS;  Service: General;  Laterality: Right;  . Breast surgery Left 2012    left breast wide excision, sentinel node, MammoSite  . Breast cyst aspiration  2010  . Breast surgery Right 08/06/2014    Wide excision/sentinel node biopsy, T1a, N0 triple negative, MammoSite  . Portacath placement Left 11/09/2014    Procedure: INSERTION PORT-A-CATH;  Surgeon: Robert Bellow, MD;  Location: [facility] ORS;  Service: General;  Laterality: Left;    Home Medications:    Medication List       This list is accurate as of: 12/26/14  8:38 AM.  Always use your most recent med list.               albuterol 108 (  90 BASE) MCG/ACT inhaler  Commonly known as:  PROVENTIL HFA;VENTOLIN HFA  Inhale 2 puffs into the lungs every 6 (six) hours as needed for wheezing or shortness of breath.     aspirin 81 MG tablet  Take 81 mg by mouth daily.     b complex vitamins tablet  Take 1 tablet by mouth daily.     esomeprazole 40 MG capsule  Commonly known as:  NEXIUM  Take 40 mg by mouth 2 (two) times daily as needed (acid reflux).     fexofenadine 180 MG tablet  Commonly known as:  ALLEGRA  Take 180 mg by mouth daily as needed for allergies.     fluconazole 100 MG tablet  Commonly known as:  DIFLUCAN  Take 1 tablet (100 mg total) by mouth daily.     hydrochlorothiazide 25 MG tablet    Commonly known as:  HYDRODIURIL  Take 12.5 mg by mouth daily.     levofloxacin 500 MG tablet  Commonly known as:  LEVAQUIN  Take 1 tablet (500 mg total) by mouth daily.     lidocaine-prilocaine cream  Commonly known as:  EMLA  Apply cream 1 hour before chemotherapy treatment, place saran wrap over the cream to avoid getting on clothes.     potassium chloride SA 20 MEQ tablet  Commonly known as:  K-DUR,KLOR-CON  Take 1 tablet (20 mEq total) by mouth daily.     POTASSIUM PO  Take 1,000 Units by mouth daily.     promethazine 25 MG tablet  Commonly known as:  PHENERGAN  Take 1 tablet (25 mg total) by mouth every 6 (six) hours as needed for nausea or vomiting.     Vitamin D3 1000 UNITS Caps  Take 1 capsule by mouth daily.        Allergies:  Allergies  Allergen Reactions  . Shellfish Allergy Other (See Comments)    Hives and swelling on ears and feet  . Tape Rash    Surgical tape from breast biopsy    Family History: Family History  Problem Relation Age of Onset  . Ovarian cancer Mother   . Cancer Father   . Diabetes Father     Social History:  reports that she has never smoked. She has never used smokeless tobacco. She reports that she drinks alcohol. She reports that she does not use illicit drugs.  ROS:   Gastrointestinal (upper)  : Negative for upper GI symptoms  Gastrointestinal (lower) : Negative for lower GI symptoms  Constitutional : Negative for symptoms  Skin: Negative for skin symptoms  Eyes: Negative for eye symptoms  Ear/Nose/Throat : Negative for Ear/Nose/Throat symptoms  Hematologic/Lymphatic: Negative for Hematologic/Lymphatic symptoms  Cardiovascular : Negative for cardiovascular symptoms  Respiratory : Negative for respiratory symptoms  Endocrine: Negative for endocrine symptoms  Musculoskeletal: Negative for musculoskeletal symptoms  Neurological: Negative for neurological symptoms  Psychologic: Negative for  psychiatric symptoms    Physical Exam: BP 138/67 mmHg  Pulse 106  Ht 5\' 5"  (1.651 m)  Wt 202 lb 3.2 oz (91.717 kg)  BMI 33.65 kg/m2  Constitutional:  Alert and oriented, No acute distress. HEENT: [place] AT, moist mucus membranes.  Trachea midline, no masses. Cardiovascular: No clubbing, cyanosis, or edema. Respiratory: Normal respiratory effort, no increased work of breathing. GI: Abdomen is soft, nontender, nondistended, no abdominal masses GU: No CVA tenderness. No SP TTP.  Skin: No rashes, bruises or suspicious lesions. Lymph: No cervical or inguinal adenopathy. Neurologic: Grossly intact, no focal deficits,  moving all 4 extremities. Psychiatric: Normal mood and affect.  Laboratory Data: Lab Results  Component Value Date   WBC 0.7* 12/22/2014   HGB 9.0* 12/22/2014   HCT 26.4* 12/22/2014   MCV 92.2 12/22/2014   PLT 141* 12/22/2014    Lab Results  Component Value Date   CREATININE 1.04* 12/08/2014    No results found for: PSA  No results found for: TESTOSTERONE  No results found for: HGBA1C  Urinalysis No results found for: COLORURINE, APPEARANCEUR, LABSPEC, [place], GLUCOSEU, HGBUR, BILIRUBINUR, KETONESUR, PROTEINUR, UROBILINOGEN, NITRITE, LEUKOCYTESUR  Pertinent Imaging: As per HPI  Assessment & Plan:    1 - Nephrolithiasis - discussed symptoms of colic should they occur. Given small volume and non-recurrent no dedicated surveillance warranted.   2 - Non-Complex Renal Cysts - small and non-nodular, no further eval warranted.   3 - Small Incidental Pancreatic Head Mass - stable and non-worrisome for primary malignancy by dedicated f/u CT. Observe.  4 - RTC Urol prn.    No Follow-up on file.  Alexis Frock, [facility] [address] [place], [place] [address] [phone]

## 2014-12-29 ENCOUNTER — Inpatient Hospital Stay: Payer: 59

## 2014-12-29 DIAGNOSIS — C50911 Malignant neoplasm of unspecified site of right female breast: Secondary | ICD-10-CM | POA: Diagnosis not present

## 2014-12-29 DIAGNOSIS — C50912 Malignant neoplasm of unspecified site of left female breast: Secondary | ICD-10-CM

## 2014-12-29 LAB — CBC WITH DIFFERENTIAL/PLATELET
BASOS ABS: 0 10*3/uL (ref 0–0.1)
BASOS PCT: 0 %
EOS ABS: 0 10*3/uL (ref 0–0.7)
Eosinophils Relative: 0 %
HCT: 26.9 % — ABNORMAL LOW (ref 35.0–47.0)
HEMOGLOBIN: 9 g/dL — AB (ref 12.0–16.0)
Lymphocytes Relative: 14 %
Lymphs Abs: 1.2 10*3/uL (ref 1.0–3.6)
MCH: 31 pg (ref 26.0–34.0)
MCHC: 33.6 g/dL (ref 32.0–36.0)
MCV: 92.3 fL (ref 80.0–100.0)
Monocytes Absolute: 1.2 10*3/uL — ABNORMAL HIGH (ref 0.2–0.9)
Monocytes Relative: 14 %
NEUTROS PCT: 72 %
Neutro Abs: 6.1 10*3/uL (ref 1.4–6.5)
Platelets: 104 10*3/uL — ABNORMAL LOW (ref 150–440)
RBC: 2.91 MIL/uL — AB (ref 3.80–5.20)
RDW: 14.2 % (ref 11.5–14.5)
WBC: 8.6 10*3/uL (ref 3.6–11.0)

## 2014-12-29 LAB — HEPATIC FUNCTION PANEL
ALBUMIN: 3.8 g/dL (ref 3.5–5.0)
ALT: 15 U/L (ref 14–54)
AST: 20 U/L (ref 15–41)
Alkaline Phosphatase: 83 U/L (ref 38–126)
BILIRUBIN TOTAL: 0.2 mg/dL — AB (ref 0.3–1.2)
Bilirubin, Direct: <0.1 mg/dL — ABNORMAL LOW (ref 0.1–0.5)
Total Protein: 7.5 g/dL (ref 6.5–8.1)

## 2015-01-05 ENCOUNTER — Inpatient Hospital Stay: Payer: 59

## 2015-01-05 ENCOUNTER — Inpatient Hospital Stay (HOSPITAL_BASED_OUTPATIENT_CLINIC_OR_DEPARTMENT_OTHER): Payer: 59 | Admitting: Internal Medicine

## 2015-01-05 ENCOUNTER — Other Ambulatory Visit: Payer: Self-pay | Admitting: *Deleted

## 2015-01-05 VITALS — BP 130/84 | HR 91 | Temp 97.7°F | Ht 61.0 in | Wt 205.5 lb

## 2015-01-05 DIAGNOSIS — C50211 Malignant neoplasm of upper-inner quadrant of right female breast: Secondary | ICD-10-CM

## 2015-01-05 DIAGNOSIS — D472 Monoclonal gammopathy: Secondary | ICD-10-CM

## 2015-01-05 DIAGNOSIS — Z171 Estrogen receptor negative status [ER-]: Secondary | ICD-10-CM

## 2015-01-05 DIAGNOSIS — Z79899 Other long term (current) drug therapy: Secondary | ICD-10-CM

## 2015-01-05 DIAGNOSIS — C50911 Malignant neoplasm of unspecified site of right female breast: Secondary | ICD-10-CM

## 2015-01-05 DIAGNOSIS — Z418 Encounter for other procedures for purposes other than remedying health state: Secondary | ICD-10-CM

## 2015-01-05 DIAGNOSIS — Z853 Personal history of malignant neoplasm of breast: Secondary | ICD-10-CM

## 2015-01-05 DIAGNOSIS — D649 Anemia, unspecified: Secondary | ICD-10-CM

## 2015-01-05 DIAGNOSIS — E876 Hypokalemia: Secondary | ICD-10-CM

## 2015-01-05 LAB — BASIC METABOLIC PANEL
ANION GAP: 2 — AB (ref 5–15)
BUN: 23 mg/dL — AB (ref 6–20)
CALCIUM: 8.9 mg/dL (ref 8.9–10.3)
CO2: 28 mmol/L (ref 22–32)
Chloride: 104 mmol/L (ref 101–111)
Creatinine, Ser: 0.93 mg/dL (ref 0.44–1.00)
GFR calc non Af Amer: >60 mL/min (ref >60)
GLUCOSE: 115 mg/dL — AB (ref 65–99)
Potassium: 3.4 mmol/L — ABNORMAL LOW (ref 3.5–5.1)
Sodium: 134 mmol/L — ABNORMAL LOW (ref 135–145)

## 2015-01-05 LAB — CBC WITH DIFFERENTIAL/PLATELET
BASOS ABS: 0.1 10*3/uL (ref 0–0.1)
BASOS PCT: 1 %
EOS PCT: 1 %
Eosinophils Absolute: 0 10*3/uL (ref 0–0.7)
HCT: 26.6 % — ABNORMAL LOW (ref 35.0–47.0)
Hemoglobin: 9.1 g/dL — ABNORMAL LOW (ref 12.0–16.0)
Lymphocytes Relative: 10 %
Lymphs Abs: 0.7 10*3/uL — ABNORMAL LOW (ref 1.0–3.6)
MCH: 31.5 pg (ref 26.0–34.0)
MCHC: 34 g/dL (ref 32.0–36.0)
MCV: 92.6 fL (ref 80.0–100.0)
MONO ABS: 1 10*3/uL — AB (ref 0.2–0.9)
Monocytes Relative: 12 %
Neutro Abs: 6 10*3/uL (ref 1.4–6.5)
Neutrophils Relative %: 76 %
PLATELETS: 310 10*3/uL (ref 150–440)
RBC: 2.88 MIL/uL — ABNORMAL LOW (ref 3.80–5.20)
RDW: 15.2 % — AB (ref 11.5–14.5)
WBC: 7.7 10*3/uL (ref 4.0–10.5)

## 2015-01-05 MED ORDER — PALONOSETRON HCL INJECTION 0.25 MG/5ML
0.2500 mg | Freq: Once | INTRAVENOUS | Status: AC
  Administered 2015-01-05: 0.25 mg via INTRAVENOUS
  Filled 2015-01-05: qty 5

## 2015-01-05 MED ORDER — SODIUM CHLORIDE 0.9 % IV SOLN
Freq: Once | INTRAVENOUS | Status: AC
  Administered 2015-01-05: 10:00:00 via INTRAVENOUS
  Filled 2015-01-05: qty 1000

## 2015-01-05 MED ORDER — FOSAPREPITANT DIMEGLUMINE INJECTION 150 MG
Freq: Once | INTRAVENOUS | Status: AC
  Administered 2015-01-05: 10:00:00 via INTRAVENOUS
  Filled 2015-01-05: qty 5

## 2015-01-05 MED ORDER — SODIUM CHLORIDE 0.9 % IJ SOLN
10.0000 mL | Freq: Once | INTRAMUSCULAR | Status: AC
  Administered 2015-01-05: 10 mL via INTRAVENOUS
  Filled 2015-01-05: qty 10

## 2015-01-05 MED ORDER — SODIUM CHLORIDE 0.9 % IV SOLN
600.0000 mg/m2 | Freq: Once | INTRAVENOUS | Status: AC
  Administered 2015-01-05: 1240 mg via INTRAVENOUS
  Filled 2015-01-05: qty 62

## 2015-01-05 MED ORDER — HEPARIN SOD (PORK) LOCK FLUSH 100 UNIT/ML IV SOLN
500.0000 [IU] | Freq: Once | INTRAVENOUS | Status: AC | PRN
  Administered 2015-01-05: 500 [IU]
  Filled 2015-01-05: qty 5

## 2015-01-05 MED ORDER — PEGFILGRASTIM 6 MG/0.6ML ~~LOC~~ PSKT
6.0000 mg | PREFILLED_SYRINGE | Freq: Once | SUBCUTANEOUS | Status: AC
  Administered 2015-01-05: 6 mg via SUBCUTANEOUS
  Filled 2015-01-05: qty 0.6

## 2015-01-05 MED ORDER — DOXORUBICIN HCL CHEMO IV INJECTION 2 MG/ML
60.0000 mg/m2 | Freq: Once | INTRAVENOUS | Status: AC
  Administered 2015-01-05: 124 mg via INTRAVENOUS
  Filled 2015-01-05: qty 62

## 2015-01-05 NOTE — Progress Notes (Signed)
[facility] OFFICE PROGRESS NOTE  Patient Care Team: Guadalupe Maple, MD as PCP - General (Unknown Physician Specialty) Robert Bellow, MD (General Surgery) Guadalupe Maple, MD (Family Medicine)   SUMMARY OF ONCOLOGIC HISTORY:  # MAY 2016- RIGHT BREAST CANCER- Triple negative [T=0.5cm;G-3; margins-Neg]; Mammaprint- 0.814/molecular basal type [5 year risk- 22%; 10 year risk-29%] AC q3 W- taxol q w x 12  # 2012 LEFT BREAST CA STAGE I s/p Lumpec [T1 (0.7CM);Triple Neg; G-3]; s/p mammosite RT; No adj chemo  #  Mild Anemia/M-protein 0.8gm/dl [sep 2016]    INTERVAL HISTORY:  This is my first interaction with the patient since I joined the practice September 2016. I reviewed the patient's prior chart/pertinent labs/imaging in detail; findings are summarized.  [name] patient with above history of stage I right-sided triple negative breast cancer currently on adjuvant chemotherapy with Adriamycin and Cytoxan status post 2 cycles is here for follow-up.  Patient denies any significant nausea vomiting. She does complain of fatigue. Mild sores in the mouth. Denies any unusual diarrhea; intermittent constipation. No rash on arms and soles.   REVIEW OF SYSTEMS:  A complete 10 point review of system is done which is negative except mentioned above/history of present illness.   PAST MEDICAL HISTORY :  Past Medical History  Diagnosis Date  . Seasonal allergies   . Unspecified essential hypertension   . Asthma   . 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 Dry eyes 2012  . Breast screening, unspecified   . Lump or mass in breast   . Special screening for malignant neoplasms, colon   . Obesity, unspecified   . Screening for obesity   . Breast cyst 2010    cyst-  drained  . Constipation   . Kidney stone   . GERD (gastroesophageal reflux disease)   . Anemia   . Malignant neoplasm of breast (female), unspecified site 07/08/2014    Right breast, 5 mm, T1a,N0, triple negative; high risk for recurrence on Mammoprint.  . Malignant neoplasm of upper-outer quadrant of female breast ([place]) 05/08/2010    Left breast: T1b, N0, M); triple negative, DCIS present. No chemotherapy. MammoSite radiation.  . Breast cancer ([place])   . Hematuria   . Pancreatic lesion     PAST SURGICAL HISTORY :   Past Surgical History  Procedure Laterality Date  . Cesarean section  1988  . Colonoscopy  2011    Dr. Dionne Milo  . Breast mammosite  April 2012    mammosite placement and removal   . Kidney stone surgery  2003    stones removed by Dr. Quillian Quince- stent placed/removed  . Tubal ligation    . Breast lumpectomy with axillary lymph node biopsy Right 08/01/2014    Procedure: BREAST LUMPECTOMY WITH AXILLARY LYMPH NODE BIOPSY;  Surgeon: Robert Bellow, MD;  Location: [facility];  Service: General;  Laterality: Right;  . Sentinel node biopsy Right 08/01/2014    Procedure: SENTINEL NODE BIOPSY;  Surgeon: Robert Bellow, MD;  Location: [facility] OR[facility];  Service: General;  Laterality: Right;  . Breast surgery Left 2012    left breast wide excision, sentinel node, MammoSite  . Breast cyst aspiration  2010  . Breast surgery Right 08/06/2014    Wide excision/sentinel node biopsy, T1a,  N0 triple negative, MammoSite  . Portacath placement Left 11/09/2014    Procedure: INSERTION PORT-A-CATH;  Surgeon: Robert Bellow, MD;  Location: [facility] OR[facility];  Service: General;  Laterality: Left;    FAMILY HISTORY :   Family History  Problem Relation Age of Onset  . Ovarian cancer Mother   . Cancer Father   . Diabetes Father     SOCIAL HISTORY:   Social History  Substance Use Topics  . Smoking status: Never Smoker   . Smokeless tobacco: Never Used  . Alcohol Use: Yes     Comment: occasionally     ALLERGIES:  is allergic to shellfish allergy and tape.  MEDICATIONS:  Current Outpatient Prescriptions  Medication Sig Dispense Refill  . albuterol (PROVENTIL HFA;VENTOLIN HFA) 108 (90 BASE) MCG/ACT inhaler Inhale 2 puffs into the lungs every 6 (six) hours as needed for wheezing or shortness of breath.    Marland Kitchen aspirin 81 MG tablet Take 81 mg by mouth daily.    . Cholecalciferol (VITAMIN D3) 1000 UNITS CAPS Take 1 capsule by mouth daily.     Marland Kitchen esomeprazole (NEXIUM) 40 MG capsule Take 40 mg by mouth 2 (two) times daily as needed (acid reflux).     . fluconazole (DIFLUCAN) 100 MG tablet Take 1 tablet (100 mg total) by mouth daily. 2 tablet 0  . hydrochlorothiazide (HYDRODIURIL) 25 MG tablet Take 12.5 mg by mouth daily.     Marland Kitchen lidocaine-prilocaine (EMLA) cream Apply cream 1 hour before chemotherapy treatment, place saran wrap over the cream to avoid getting on clothes. 30 g 1  . potassium chloride (K-DUR,KLOR-CON) 20 MEQ tablet Take 1 tablet (20 mEq total) by mouth daily. 7 tablet 0  . POTASSIUM PO Take 1,000 Units by mouth daily.    . promethazine (PHENERGAN) 25 MG tablet Take 1 tablet (25 mg total) by mouth every 6 (six) hours as needed for nausea or vomiting. 60 tablet 2  . b complex vitamins tablet Take 1 tablet by mouth daily.    . fexofenadine (ALLEGRA) 180 MG tablet Take 180 mg by mouth daily as needed for allergies.     Marland Kitchen levofloxacin (LEVAQUIN) 500 MG tablet Take 1 tablet (500 mg total) by mouth daily. (Patient not taking: Reported on 01/05/2015) 7 tablet 0   No current facility-administered medications for this visit.   Facility-Administered Medications Ordered in Other Visits  Medication Dose Route Frequency Provider Last Rate Last Dose  . sodium chloride 0.9 % injection 10 mL  10 mL Intravenous PRN Leia Alf, MD   10 mL at 11/10/14 1138    PHYSICAL EXAMINATION: ECOG PERFORMANCE STATUS: 1 - Symptomatic but completely ambulatory  BP 130/84 mmHg  Pulse 91  Temp(Src) 97.7 F  (36.5 C) (Tympanic)  Ht 5\' 1"  (1.549 m)  Wt 205 lb 7.5 oz (93.2 kg)  BMI 38.84 kg/m2  Filed Weights   01/05/15 0920  Weight: 205 lb 7.5 oz (93.2 kg)    GENERAL: Well-nourished well-developed; Alert, no distress and comfortable.   Accompanied by aunt.  EYES: no pallor or icterus OROPHARYNX: no thrush or ulceration; good dentition  NECK: supple, no masses felt LYMPH:  no palpable lymphadenopathy in the cervical, axillary or inguinal regions LUNGS: clear to auscultation and  No wheeze or crackles HEART/CVS: regular rate & rhythm and no murmurs; No lower extremity edema ABDOMEN:abdomen soft, non-tender and normal bowel sounds Musculoskeletal:no cyanosis of digits and no clubbing  PSYCH: alert & oriented x 3 with fluent speech NEURO: no focal motor/sensory  deficits SKIN:  no rashes or significant lesions; Mediport in place no erythema and no discharge no tenderness.  LABORATORY DATA:  I have reviewed the data as listed    Component Value Date/Time   NA 134* 01/05/2015 0901   NA 140 07/16/2011 2130   K 3.4* 01/05/2015 0901   K 3.7 07/16/2011 2130   CL 104 01/05/2015 0901   CL 106 07/16/2011 2130   CO2 28 01/05/2015 0901   CO2 27 07/16/2011 2130   GLUCOSE 115* 01/05/2015 0901   GLUCOSE 93 07/16/2011 2130   BUN 23* 01/05/2015 0901   BUN 16 07/16/2011 2130   CREATININE 0.93 01/05/2015 0901   CREATININE 0.95 07/16/2011 2130   CALCIUM 8.9 01/05/2015 0901   CALCIUM 9.0 07/16/2011 2130   PROT 7.5 12/29/2014 1046   ALBUMIN 3.8 12/29/2014 1046   AST 20 12/29/2014 1046   ALT 15 12/29/2014 1046   ALKPHOS 83 12/29/2014 1046   BILITOT 0.2* 12/29/2014 1046   GFRNONAA >60 01/05/2015 0901   GFRNONAA >60 07/16/2011 2130   GFRAA >60 01/05/2015 0901   GFRAA >60 07/16/2011 2130    No results found for: SPEP, UPEP  Lab Results  Component Value Date   WBC 7.7 01/05/2015   NEUTROABS 6.0 01/05/2015   HGB 9.1* 01/05/2015   HCT 26.6* 01/05/2015   MCV 92.6 01/05/2015   PLT 310  01/05/2015      Chemistry      Component Value Date/Time   NA 134* 01/05/2015 0901   NA 140 07/16/2011 2130   K 3.4* 01/05/2015 0901   K 3.7 07/16/2011 2130   CL 104 01/05/2015 0901   CL 106 07/16/2011 2130   CO2 28 01/05/2015 0901   CO2 27 07/16/2011 2130   BUN 23* 01/05/2015 0901   BUN 16 07/16/2011 2130   CREATININE 0.93 01/05/2015 0901   CREATININE 0.95 07/16/2011 2130      Component Value Date/Time   CALCIUM 8.9 01/05/2015 0901   CALCIUM 9.0 07/16/2011 2130   ALKPHOS 83 12/29/2014 1046   AST 20 12/29/2014 1046   ALT 15 12/29/2014 1046   BILITOT 0.2* 12/29/2014 1046       RADIOGRAPHIC STUDIES: I have personally reviewed the radiological images as listed and agreed with the findings in the report. No results found.   ASSESSMENT & PLAN:   # RIGHT BREAST CANCER STAGE I - triple negative; on adjuvant chemotherapy Adriamycin and Cytoxan status post cycle #2. Patient tolerating chemotherapy fairly well without any major adverse effects.  Labs reviewed today adequate. Proceed with cycle #3.   Patient to follow-up with [place] in approximately 3 weeks prior to cycle #4.  Orders Placed This Encounter  Procedures  . CBC with Differential/Platelet    Standing Status: Future     Number of Occurrences:      Standing Expiration Date: 01/05/2016  . Comprehensive metabolic panel    Standing Status: Future     Number of Occurrences:      Standing Expiration Date: 01/05/2016  . CBC with Differential/Platelet    Standing Status: Future     Number of Occurrences:      Standing Expiration Date: 01/05/2016  . CBC with Differential/Platelet    Standing Status: Future     Number of Occurrences:      Standing Expiration Date: 01/05/2016   All questions were answered. The patient knows to call the clinic with any problems, questions or concerns. No barriers to learning was detected.  I spent 25  minutes counseling the patient face to face. The total time spent in the appointment  was 30 minutes and more than 50% was on counseling and review of test results     Cammie Sickle, MD 01/05/2015 1:24 PM

## 2015-01-08 ENCOUNTER — Encounter: Payer: Self-pay | Admitting: Unknown Physician Specialty

## 2015-01-08 ENCOUNTER — Ambulatory Visit (INDEPENDENT_AMBULATORY_CARE_PROVIDER_SITE_OTHER): Payer: 59 | Admitting: Unknown Physician Specialty

## 2015-01-08 ENCOUNTER — Ambulatory Visit (INDEPENDENT_AMBULATORY_CARE_PROVIDER_SITE_OTHER): Payer: 59 | Admitting: General Surgery

## 2015-01-08 ENCOUNTER — Encounter: Payer: Self-pay | Admitting: General Surgery

## 2015-01-08 VITALS — BP 128/74 | HR 72 | Resp 13 | Ht 65.0 in | Wt 202.0 lb

## 2015-01-08 VITALS — BP 131/84 | HR 116 | Temp 98.8°F | Ht 65.2 in | Wt 202.6 lb

## 2015-01-08 DIAGNOSIS — R319 Hematuria, unspecified: Secondary | ICD-10-CM

## 2015-01-08 DIAGNOSIS — R309 Painful micturition, unspecified: Secondary | ICD-10-CM | POA: Diagnosis not present

## 2015-01-08 DIAGNOSIS — C50911 Malignant neoplasm of unspecified site of right female breast: Secondary | ICD-10-CM

## 2015-01-08 DIAGNOSIS — I10 Essential (primary) hypertension: Secondary | ICD-10-CM

## 2015-01-08 LAB — UA/M W/RFLX CULTURE, ROUTINE
Bilirubin, UA: NEGATIVE
Glucose, UA: NEGATIVE
Ketones, UA: NEGATIVE
LEUKOCYTES UA: NEGATIVE
NITRITE UA: NEGATIVE
Protein, UA: NEGATIVE
SPEC GRAV UA: 1.02 (ref 1.005–1.030)
UUROB: 0.2 mg/dL (ref 0.2–1.0)
pH, UA: 5.5 (ref 5.0–7.5)

## 2015-01-08 LAB — MICROSCOPIC EXAMINATION

## 2015-01-08 MED ORDER — HYDROCHLOROTHIAZIDE 12.5 MG PO TABS: 12.5000 mg | ORAL_TABLET | Freq: Every day | ORAL | Status: DC

## 2015-01-08 NOTE — Progress Notes (Signed)
Patient ID: Adrienne [name], female   DOB: 1961/02/28, 54 y.o.   MRN: [id number]  Chief Complaint  Patient presents with  . Follow-up    Breast Cancer    HPI Adrienne [name] is a 54 y.o. female here today for her breast cancer follow up. She states she is still completing treatments with Adriamycin and Cytoxan which have been particularly hard. She is otherwise doing well. No breast complaints.  HPI  Past Medical History  Diagnosis Date  . Seasonal allergies   . Unspecified essential hypertension   . Asthma   . 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 Dry eyes 2012  . Breast screening, unspecified   . Lump or mass in breast   . Special screening for malignant neoplasms, colon   . Obesity, unspecified   . Screening for obesity   . Breast cyst 2010    cyst- drained  . Constipation   . Kidney stone   . GERD (gastroesophageal reflux disease)   . Anemia   . Malignant neoplasm of breast (female), unspecified site 07/08/2014    Right breast, 5 mm, T1a,N0, triple negative; high risk for recurrence on Mammoprint.  . Malignant neoplasm of upper-outer quadrant of female breast ([place]) 05/08/2010    Left breast: T1b, N0, M); triple negative, DCIS present. No chemotherapy. MammoSite radiation.  . Breast cancer ([place])   . Hematuria   . Pancreatic lesion     Past Surgical History  Procedure Laterality Date  . Cesarean section  1988  . Colonoscopy  2011    Dr. Dionne Milo  . Breast mammosite  April 2012    mammosite placement and removal   . Kidney stone surgery  2003    stones removed by Dr. Quillian Quince- stent placed/removed  . Tubal ligation    . Breast lumpectomy with axillary lymph node biopsy Right 08/01/2014    Procedure: BREAST LUMPECTOMY WITH AXILLARY LYMPH NODE BIOPSY;  Surgeon: Robert Bellow,  MD;  Location: [facility];  Service: General;  Laterality: Right;  . Sentinel node biopsy Right 08/01/2014    Procedure: SENTINEL NODE BIOPSY;  Surgeon: Robert Bellow, MD;  Location: [facility];  Service: General;  Laterality: Right;  . Breast surgery Left 2012    left breast wide excision, sentinel node, MammoSite  . Breast cyst aspiration  2010  . Breast surgery Right 08/06/2014    Wide excision/sentinel node biopsy, T1a, N0 triple negative, MammoSite  . Portacath placement Left 11/09/2014    Procedure: INSERTION PORT-A-CATH;  Surgeon: Robert Bellow, MD;  Location: [facility];  Service: General;  Laterality: Left;    Family History  Problem Relation Age of Onset  . Ovarian cancer Mother   . Diabetes Mother   . Cancer Father     prostate  . Diabetes Father     Social History Social History  Substance Use Topics  . Smoking status: Never Smoker   . Smokeless tobacco: Never Used  . Alcohol Use: Yes     Comment: occasionally    Allergies  Allergen Reactions  . Shellfish Allergy Other (See Comments)    Hives and swelling on ears and feet  . Tape Rash    Surgical tape from breast biopsy    Current Outpatient Prescriptions  Medication Sig Dispense Refill  .  albuterol (PROVENTIL HFA;VENTOLIN HFA) 108 (90 BASE) MCG/ACT inhaler Inhale 2 puffs into the lungs every 6 (six) hours as needed for wheezing or shortness of breath.    Marland Kitchen aspirin 81 MG tablet Take 81 mg by mouth daily.    . Cholecalciferol (VITAMIN D3) 1000 UNITS CAPS Take 1 capsule by mouth daily.     Marland Kitchen esomeprazole (NEXIUM) 40 MG capsule Take 40 mg by mouth 2 (two) times daily as needed (acid reflux).     . hydrochlorothiazide (HYDRODIURIL) 12.5 MG tablet Take 1 tablet (12.5 mg total) by mouth daily. 90 tablet 1  . levofloxacin (LEVAQUIN) 500 MG tablet Take 1 tablet (500 mg total) by mouth daily. (Patient taking differently: Take 500 mg by mouth as needed. ) 7 tablet 0  . lidocaine-prilocaine (EMLA) cream Apply cream 1  hour before chemotherapy treatment, place saran wrap over the cream to avoid getting on clothes. 30 g 1  . loratadine (CLARITIN) 10 MG tablet Take 10 mg by mouth daily.    Marland Kitchen POTASSIUM PO Take 1,000 Units by mouth daily.    . promethazine (PHENERGAN) 25 MG tablet Take 1 tablet (25 mg total) by mouth every 6 (six) hours as needed for nausea or vomiting. 60 tablet 2   No current facility-administered medications for this visit.   Facility-Administered Medications Ordered in Other Visits  Medication Dose Route Frequency Provider Last Rate Last Dose  . sodium chloride 0.9 % injection 10 mL  10 mL Intravenous PRN Leia Alf, MD   10 mL at 11/10/14 1138    Review of Systems Review of Systems  Constitutional: Negative.   Respiratory: Negative.   Cardiovascular: Negative.     Blood pressure 128/74, pulse 72, resp. rate 13, height 5\' 5"  (1.651 m), weight 202 lb (91.627 kg).  Physical Exam Physical Exam  Constitutional: She is oriented to person, place, and time. She appears well-developed and well-nourished.  Eyes: Conjunctivae are normal. No scleral icterus.  Neck: Neck supple.  Cardiovascular: Normal rate, regular rhythm and normal heart sounds.   Pulmonary/Chest: Effort normal and breath sounds normal. Right breast exhibits no inverted nipple, no mass, no nipple discharge, no skin change and no tenderness. Left breast exhibits skin change (thickening in the left upper outer quadrant along the scar). Left breast exhibits no inverted nipple, no mass, no nipple discharge and no tenderness.    Lymphadenopathy:    She has no cervical adenopathy.  Neurological: She is alert and oriented to person, place, and time.  Skin: Skin is warm and dry.  Psychiatric: She has a normal mood and affect.      Assessment    Doing well status post wide excision and sentinel node biopsy.    Plan    We'll plan for follow-up exam with bilateral diagnostic mammograms in April 2017.      PCP:   Kennieth Francois 01/08/2015, 8:12 PM

## 2015-01-08 NOTE — Progress Notes (Signed)
BP 131/84 mmHg  Pulse 116  Temp(Src) 98.8 F (37.1 C)  Ht 5' 5.2" (1.656 m)  Wt 202 lb 9.6 oz (91.899 kg)  BMI 33.51 kg/m2  SpO2 98%   Subjective:    Patient ID: Adrienne [name], female    DOB: [birth year], 54 y.o.   MRN: [id number]  HPI: Adrienne [name] is a 54 y.o. female  Chief Complaint  Patient presents with  . Urinary Tract Infection    pt states she went to the urologist and had a lot of bacteria in urine so she wonders if she has a UTI  . Medication Refill    pt states she needs HCTZ refilled   Urinary Tract Infection  This is a new problem. The current episode started 1 to 4 weeks ago. The problem occurs intermittently. The problem has been unchanged. The pain is mild. There has been no fever. Pertinent negatives include no chills, discharge, flank pain, hematuria or urgency. She has tried nothing for the symptoms. Her past medical history is significant for kidney stones.  Of note pt is getting chemotherapy but last WBC is good.    Hypertension:  Using medications without difficulty Average home BPs are good   Using medication without problems or lightheadedness No chest pain with exertion or shortness of breath+++++++++++++++++++++++++++++++++++++   Relevant past medical, surgical, family and social history reviewed and updated as indicated. Interim medical history since our last visit reviewed. Allergies and medications reviewed and updated.  Review of Systems  Constitutional: Negative for chills.  Genitourinary: Negative for urgency, hematuria and flank pain.    Per HPI unless specifically indicated above     Objective:    BP 131/84 mmHg  Pulse 116  Temp(Src) 98.8 F (37.1 C)  Ht 5' 5.2" (1.656 m)  Wt 202 lb 9.6 oz (91.899 kg)  BMI 33.51 kg/m2  SpO2 98%  Wt Readings from Last 3 Encounters:  01/08/15 202 lb 9.6 oz (91.899 kg)  01/05/15 205 lb 7.5 oz (93.2 kg)  12/26/14 202 lb 3.2 oz (91.717 kg)    Physical Exam  Constitutional: She is oriented  to person, place, and time. She appears well-developed and well-nourished. No distress.  HENT:  Head: Normocephalic and atraumatic.  Eyes: Conjunctivae and lids are normal. Right eye exhibits no discharge. Left eye exhibits no discharge. No scleral icterus.  Cardiovascular: Normal rate and regular rhythm.   Pulmonary/Chest: Effort normal. No respiratory distress.  Abdominal: Normal appearance and bowel sounds are normal. She exhibits no distension. There is no splenomegaly or hepatomegaly. There is no tenderness.  Musculoskeletal: Normal range of motion.  Neurological: She is alert and oriented to person, place, and time.  Skin: Skin is intact. No rash noted. No pallor.  Psychiatric: She has a normal mood and affect. Her behavior is normal. Judgment and thought content normal.    Results for orders placed or performed in visit on 01/05/15  CBC with Differential/Platelet  Result Value Ref Range   WBC 7.7 4.0 - 10.5 K/uL   RBC 2.88 (L) 3.80 - 5.20 MIL/uL   Hemoglobin 9.1 (L) 12.0 - 16.0 g/dL   HCT 26.6 (L) 35.0 - 47.0 %   MCV 92.6 80.0 - 100.0 fL   MCH 31.5 26.0 - 34.0 pg   MCHC 34.0 32.0 - 36.0 g/dL   RDW 15.2 (H) 11.5 - 14.5 %   Platelets 310 150 - 440 K/uL   Neutrophils Relative % 76 %   Neutro Abs 6.0 1.4 -  6.5 K/uL   Lymphocytes Relative 10 %   Lymphs Abs 0.7 (L) 1.0 - 3.6 K/uL   Monocytes Relative 12 %   Monocytes Absolute 1.0 (H) 0.2 - 0.9 K/uL   Eosinophils Relative 1 %   Eosinophils Absolute 0.0 0 - 0.7 K/uL   Basophils Relative 1 %   Basophils Absolute 0.1 0 - 0.1 K/uL  Basic metabolic panel  Result Value Ref Range   Sodium 134 (L) 135 - 145 mmol/L   Potassium 3.4 (L) 3.5 - 5.1 mmol/L   Chloride 104 101 - 111 mmol/L   CO2 28 22 - 32 mmol/L   Glucose, Bld 115 (H) 65 - 99 mg/dL   BUN 23 (H) 6 - 20 mg/dL   Creatinine, Ser 0.93 0.44 - 1.00 mg/dL   Calcium 8.9 8.9 - 10.3 mg/dL   GFR calc non Af Amer >60 >60 mL/min   GFR calc Af Amer >60 >60 mL/min   Anion gap 2 (L) 5  - 15      Assessment & Plan:   Problem List Items Addressed This Visit      Unprioritized   Hypertension    Good Control.  Refill HCTZ      Relevant Medications   hydrochlorothiazide (HYDRODIURIL) 12.5 MG tablet    Other Visit Diagnoses    Painful urination    -  Primary    This is mild.  Urine shows little bacteria.      Relevant Orders    UA/M w/rflx Culture, Routine    Hematuria        Chronic.  Followed by urology        Follow up plan: No Follow-up on file.

## 2015-01-08 NOTE — Assessment & Plan Note (Signed)
Good Control.  Refill HCTZ

## 2015-01-12 ENCOUNTER — Telehealth: Payer: Self-pay | Admitting: *Deleted

## 2015-01-12 ENCOUNTER — Inpatient Hospital Stay: Payer: 59 | Attending: Family Medicine

## 2015-01-12 DIAGNOSIS — D649 Anemia, unspecified: Secondary | ICD-10-CM | POA: Insufficient documentation

## 2015-01-12 DIAGNOSIS — Z7982 Long term (current) use of aspirin: Secondary | ICD-10-CM | POA: Insufficient documentation

## 2015-01-12 DIAGNOSIS — M7989 Other specified soft tissue disorders: Secondary | ICD-10-CM | POA: Diagnosis not present

## 2015-01-12 DIAGNOSIS — I10 Essential (primary) hypertension: Secondary | ICD-10-CM | POA: Diagnosis not present

## 2015-01-12 DIAGNOSIS — Z171 Estrogen receptor negative status [ER-]: Secondary | ICD-10-CM | POA: Diagnosis not present

## 2015-01-12 DIAGNOSIS — Z79899 Other long term (current) drug therapy: Secondary | ICD-10-CM | POA: Insufficient documentation

## 2015-01-12 DIAGNOSIS — K219 Gastro-esophageal reflux disease without esophagitis: Secondary | ICD-10-CM | POA: Insufficient documentation

## 2015-01-12 DIAGNOSIS — C50911 Malignant neoplasm of unspecified site of right female breast: Secondary | ICD-10-CM | POA: Insufficient documentation

## 2015-01-12 DIAGNOSIS — J45909 Unspecified asthma, uncomplicated: Secondary | ICD-10-CM | POA: Diagnosis not present

## 2015-01-12 DIAGNOSIS — Z853 Personal history of malignant neoplasm of breast: Secondary | ICD-10-CM | POA: Insufficient documentation

## 2015-01-12 DIAGNOSIS — Z5111 Encounter for antineoplastic chemotherapy: Secondary | ICD-10-CM | POA: Diagnosis not present

## 2015-01-12 LAB — CBC WITH DIFFERENTIAL/PLATELET
BASOS ABS: 0 10*3/uL (ref 0–0.1)
EOS ABS: 0 10*3/uL (ref 0–0.7)
Eosinophils Relative: 4 %
HCT: 24.6 % — ABNORMAL LOW (ref 35.0–47.0)
HEMOGLOBIN: 8.3 g/dL — AB (ref 12.0–16.0)
Lymphocytes Relative: 89 %
Lymphs Abs: 0.4 10*3/uL — ABNORMAL LOW (ref 1.0–3.6)
MCH: 31.4 pg (ref 26.0–34.0)
MCHC: 33.7 g/dL (ref 32.0–36.0)
MCV: 93.4 fL (ref 80.0–100.0)
Monocytes Absolute: 0 10*3/uL — ABNORMAL LOW (ref 0.2–0.9)
Monocytes Relative: 5 %
Neutro Abs: 0 10*3/uL — ABNORMAL LOW (ref 1.4–6.5)
Platelets: 99 10*3/uL — ABNORMAL LOW (ref 150–440)
RBC: 2.63 MIL/uL — AB (ref 3.80–5.20)
RDW: 15.7 % — ABNORMAL HIGH (ref 11.5–14.5)
WBC: 0.5 10*3/uL — AB (ref 3.6–11.0)

## 2015-01-12 MED ORDER — LEVOFLOXACIN 500 MG PO TABS: 500.0000 mg | ORAL_TABLET | Freq: Every day | ORAL | Status: DC

## 2015-01-12 MED ORDER — FLUCONAZOLE 100 MG PO TABS: 100.0000 mg | ORAL_TABLET | Freq: Every day | ORAL | Status: DC

## 2015-01-12 NOTE — Telephone Encounter (Signed)
Called pt and let her know that wbc-.2, anc 0.  Pt again advised of neutropenic precautions and she already knows because this is her 3rd [facility] treatment and she has the low counts each time.  She has been sent in electronically the levaquin and the diflucan because pt gets Yeast or UTI after atb given.  Pt is agreeable to the plan and will get atb and start them.

## 2015-01-15 ENCOUNTER — Telehealth: Payer: Self-pay | Admitting: *Deleted

## 2015-01-15 NOTE — Telephone Encounter (Signed)
Faxed fmla form

## 2015-01-19 ENCOUNTER — Inpatient Hospital Stay: Payer: 59

## 2015-01-19 DIAGNOSIS — C50911 Malignant neoplasm of unspecified site of right female breast: Secondary | ICD-10-CM

## 2015-01-19 DIAGNOSIS — R3129 Other microscopic hematuria: Secondary | ICD-10-CM

## 2015-01-19 DIAGNOSIS — K869 Disease of pancreas, unspecified: Secondary | ICD-10-CM

## 2015-01-19 DIAGNOSIS — N200 Calculus of kidney: Secondary | ICD-10-CM

## 2015-01-19 LAB — CBC WITH DIFFERENTIAL/PLATELET
Basophils Absolute: 0 10*3/uL (ref 0–0.1)
Basophils Relative: 0 %
EOS PCT: 0 %
Eosinophils Absolute: 0 10*3/uL (ref 0–0.7)
HEMATOCRIT: 22.8 % — AB (ref 35.0–47.0)
Hemoglobin: 7.7 g/dL — ABNORMAL LOW (ref 12.0–16.0)
Lymphocytes Relative: 11 %
Lymphs Abs: 0.7 10*3/uL — ABNORMAL LOW (ref 1.0–3.6)
MCH: 31.5 pg (ref 26.0–34.0)
MCHC: 33.9 g/dL (ref 32.0–36.0)
MCV: 93.1 fL (ref 80.0–100.0)
MONO ABS: 1.4 10*3/uL — AB (ref 0.2–0.9)
MONOS PCT: 24 %
NEUTROS ABS: 3.9 10*3/uL (ref 1.4–6.5)
Neutrophils Relative %: 65 %
PLATELETS: 91 10*3/uL — AB (ref 150–440)
RBC: 2.45 MIL/uL — ABNORMAL LOW (ref 3.80–5.20)
RDW: 15.8 % — AB (ref 11.5–14.5)
WBC: 6 10*3/uL (ref 3.6–11.0)

## 2015-01-26 ENCOUNTER — Inpatient Hospital Stay (HOSPITAL_BASED_OUTPATIENT_CLINIC_OR_DEPARTMENT_OTHER): Payer: 59 | Admitting: Internal Medicine

## 2015-01-26 ENCOUNTER — Inpatient Hospital Stay: Payer: 59

## 2015-01-26 VITALS — BP 135/78 | HR 97 | Temp 97.4°F | Resp 18 | Ht 65.0 in | Wt 203.0 lb

## 2015-01-26 DIAGNOSIS — D649 Anemia, unspecified: Secondary | ICD-10-CM | POA: Diagnosis not present

## 2015-01-26 DIAGNOSIS — C50911 Malignant neoplasm of unspecified site of right female breast: Secondary | ICD-10-CM

## 2015-01-26 DIAGNOSIS — D6481 Anemia due to antineoplastic chemotherapy: Secondary | ICD-10-CM

## 2015-01-26 DIAGNOSIS — M7989 Other specified soft tissue disorders: Secondary | ICD-10-CM

## 2015-01-26 DIAGNOSIS — C50111 Malignant neoplasm of central portion of right female breast: Secondary | ICD-10-CM

## 2015-01-26 DIAGNOSIS — T451X5A Adverse effect of antineoplastic and immunosuppressive drugs, initial encounter: Secondary | ICD-10-CM

## 2015-01-26 DIAGNOSIS — Z79899 Other long term (current) drug therapy: Secondary | ICD-10-CM

## 2015-01-26 DIAGNOSIS — Z171 Estrogen receptor negative status [ER-]: Secondary | ICD-10-CM

## 2015-01-26 DIAGNOSIS — Z853 Personal history of malignant neoplasm of breast: Secondary | ICD-10-CM

## 2015-01-26 LAB — COMPREHENSIVE METABOLIC PANEL
ALK PHOS: 53 U/L (ref 38–126)
ALT: 20 U/L (ref 14–54)
AST: 28 U/L (ref 15–41)
Albumin: 3.7 g/dL (ref 3.5–5.0)
Anion gap: 6 (ref 5–15)
BUN: 16 mg/dL (ref 6–20)
CHLORIDE: 106 mmol/L (ref 101–111)
CO2: 25 mmol/L (ref 22–32)
CREATININE: 0.84 mg/dL (ref 0.44–1.00)
Calcium: 8.8 mg/dL — ABNORMAL LOW (ref 8.9–10.3)
GFR calc Af Amer: >60 mL/min (ref >60)
Glucose, Bld: 129 mg/dL — ABNORMAL HIGH (ref 65–99)
Potassium: 3.4 mmol/L — ABNORMAL LOW (ref 3.5–5.1)
SODIUM: 137 mmol/L (ref 135–145)
Total Bilirubin: 0.4 mg/dL (ref 0.3–1.2)
Total Protein: 6.9 g/dL (ref 6.5–8.1)

## 2015-01-26 LAB — CBC WITH DIFFERENTIAL/PLATELET
Basophils Absolute: 0 10*3/uL (ref 0–0.1)
Basophils Relative: 1 %
EOS PCT: 0 %
Eosinophils Absolute: 0 10*3/uL (ref 0–0.7)
HCT: 23.2 % — ABNORMAL LOW (ref 35.0–47.0)
HEMOGLOBIN: 7.8 g/dL — AB (ref 12.0–16.0)
LYMPHS PCT: 23 %
Lymphs Abs: 0.7 10*3/uL — ABNORMAL LOW (ref 1.0–3.6)
MCH: 31.7 pg (ref 26.0–34.0)
MCHC: 33.7 g/dL (ref 32.0–36.0)
MCV: 93.9 fL (ref 80.0–100.0)
MONO ABS: 0.9 10*3/uL (ref 0.2–0.9)
Monocytes Relative: 29 %
NEUTROS PCT: 47 %
Neutro Abs: 1.5 10*3/uL (ref 1.4–6.5)
PLATELETS: 263 10*3/uL (ref 150–440)
RBC: 2.47 MIL/uL — AB (ref 3.80–5.20)
RDW: 17.8 % — ABNORMAL HIGH (ref 11.5–14.5)
WBC: 3.1 10*3/uL — AB (ref 3.6–11.0)

## 2015-01-26 LAB — PREPARE RBC (CROSSMATCH)

## 2015-01-26 LAB — ABO/RH: ABO/RH(D): O POS

## 2015-01-26 LAB — SAMPLE TO BLOOD BANK

## 2015-01-26 MED ORDER — PEGFILGRASTIM 6 MG/0.6ML ~~LOC~~ PSKT
6.0000 mg | PREFILLED_SYRINGE | Freq: Once | SUBCUTANEOUS | Status: AC
  Administered 2015-01-26: 6 mg via SUBCUTANEOUS
  Filled 2015-01-26: qty 0.6

## 2015-01-26 MED ORDER — HEPARIN SOD (PORK) LOCK FLUSH 100 UNIT/ML IV SOLN
500.0000 [IU] | Freq: Once | INTRAVENOUS | Status: AC
Start: 2015-01-26 — End: 2015-01-26
  Administered 2015-01-26: 500 [IU] via INTRAVENOUS
  Filled 2015-01-26: qty 5

## 2015-01-26 MED ORDER — SODIUM CHLORIDE 0.9 % IJ SOLN
10.0000 mL | INTRAMUSCULAR | Status: DC | PRN
  Administered 2015-01-26: 10 mL via INTRAVENOUS
  Filled 2015-01-26: qty 10

## 2015-01-26 MED ORDER — SODIUM CHLORIDE 0.9 % IV SOLN
Freq: Once | INTRAVENOUS | Status: AC
Start: 2015-01-26 — End: 2015-01-26
  Administered 2015-01-26: 11:00:00 via INTRAVENOUS
  Filled 2015-01-26: qty 1000

## 2015-01-26 MED ORDER — PALONOSETRON HCL INJECTION 0.25 MG/5ML
0.2500 mg | Freq: Once | INTRAVENOUS | Status: AC
Start: 2015-01-26 — End: 2015-01-26
  Administered 2015-01-26: 0.25 mg via INTRAVENOUS
  Filled 2015-01-26: qty 5

## 2015-01-26 MED ORDER — SODIUM CHLORIDE 0.9 % IV SOLN
600.0000 mg/m2 | Freq: Once | INTRAVENOUS | Status: AC
  Administered 2015-01-26: 1240 mg via INTRAVENOUS
  Filled 2015-01-26: qty 50

## 2015-01-26 MED ORDER — SODIUM CHLORIDE 0.9 % IV SOLN
Freq: Once | INTRAVENOUS | Status: AC
  Administered 2015-01-26: 11:00:00 via INTRAVENOUS
  Filled 2015-01-26: qty 5

## 2015-01-26 MED ORDER — DOXORUBICIN HCL CHEMO IV INJECTION 2 MG/ML
60.0000 mg/m2 | Freq: Once | INTRAVENOUS | Status: AC
  Administered 2015-01-26: 124 mg via INTRAVENOUS
  Filled 2015-01-26: qty 62

## 2015-01-26 NOTE — Progress Notes (Signed)
[place] OFFICE PROGRESS NOTE  Patient Care Team: Guadalupe Maple, MD as PCP - General (Unknown Physician Specialty) Robert Bellow, MD (General Surgery) Guadalupe Maple, MD (Family Medicine)   SUMMARY OF ONCOLOGIC HISTORY:  # MAY 2016- RIGHT BREAST CANCER- Triple negative [T=0.5cm;G-3; margins-Neg]; Mammaprint- 0.814/molecular basal type [5 year risk- 22%; 10 year risk-29%] AC q3 W- taxol q w x 12  # 2012 LEFT BREAST CA STAGE I s/p Lumpec [T1 (0.7CM);Triple Neg; G-3]; s/p mammosite RT; No adj chemo  #  Mild Anemia/M-protein 0.8gm/dl [sep 2016]    INTERVAL HISTORY:  [name] patient with above history of stage I right-sided triple negative breast cancer currently on adjuvant chemotherapy with Adriamycin and Cytoxan status post 3 cycles is here for follow-up.  Patient denies any significant nausea vomiting. However she does complain of extreme fatigue. No significant sores in the mouth. No diarrhea. No rash.  She has mild intermittent swelling in the legs. Otherwise no chest pain shortness of breath or cough.  REVIEW OF SYSTEMS:  A complete 10 point review of system is done which is negative except mentioned above/history of present illness.   PAST MEDICAL HISTORY :  Past Medical History  Diagnosis Date  . Seasonal allergies   . Unspecified essential hypertension   . Asthma   . 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 Dry eyes 2012  . Breast screening, unspecified   . Lump or mass in breast   . Special screening for malignant neoplasms, colon   . Obesity, unspecified   . Screening for obesity   . Breast cyst 2010    cyst- drained  . Constipation   . Kidney stone   . GERD (gastroesophageal reflux disease)   . Anemia   . Malignant neoplasm of  breast (female), unspecified site 07/08/2014    Right breast, 5 mm, T1a,N0, triple negative; high risk for recurrence on Mammoprint.  . Malignant neoplasm of upper-outer quadrant of female breast ([place]) 05/08/2010    Left breast: T1b, N0, M); triple negative, DCIS present. No chemotherapy. MammoSite radiation.  . Breast cancer ([place])   . Hematuria   . Pancreatic lesion     PAST SURGICAL HISTORY :   Past Surgical History  Procedure Laterality Date  . Cesarean section  1988  . Colonoscopy  2011    Dr. Dionne Milo  . Breast mammosite  April 2012    mammosite placement and removal   . Kidney stone surgery  2003    stones removed by Dr. Quillian Quince- stent placed/removed  . Tubal ligation    . Breast lumpectomy with axillary lymph node biopsy Right 08/01/2014    Procedure: BREAST LUMPECTOMY WITH AXILLARY LYMPH NODE BIOPSY;  Surgeon: Robert Bellow, MD;  Location: [facility] ORS;  Service: General;  Laterality: Right;  . Sentinel node biopsy Right 08/01/2014    Procedure: SENTINEL NODE BIOPSY;  Surgeon: Robert Bellow, MD;  Location: [facility] ORS;  Service: General;  Laterality: Right;  . Breast surgery Left 2012    left breast wide excision, sentinel node, MammoSite  . Breast cyst aspiration  2010  . Breast surgery Right 08/06/2014    Wide excision/sentinel node biopsy, T1a, N0 triple negative, MammoSite  . Portacath placement Left 11/09/2014    Procedure: INSERTION PORT-A-CATH;  Surgeon: Robert Bellow, MD;  Location: [facility] ORS;  Service: General;  Laterality: Left;    FAMILY HISTORY :   Family History  Problem Relation Age of Onset  . Ovarian cancer Mother   . Diabetes Mother   . Cancer Father     prostate  . Diabetes Father     SOCIAL HISTORY:   Social History  Substance Use Topics  . Smoking status: Never Smoker   . Smokeless tobacco: Never Used  . Alcohol Use: Yes     Comment: occasionally    ALLERGIES:  is allergic to shellfish allergy and tape.  MEDICATIONS:  Current  Outpatient Prescriptions  Medication Sig Dispense Refill  . albuterol (PROVENTIL HFA;VENTOLIN HFA) 108 (90 BASE) MCG/ACT inhaler Inhale 2 puffs into the lungs every 6 (six) hours as needed for wheezing or shortness of breath.    Marland Kitchen aspirin 81 MG tablet Take 81 mg by mouth daily.    . Cholecalciferol (VITAMIN D3) 1000 UNITS CAPS Take 1 capsule by mouth daily.     Marland Kitchen esomeprazole (NEXIUM) 40 MG capsule Take 40 mg by mouth 2 (two) times daily as needed (acid reflux).     . fluconazole (DIFLUCAN) 100 MG tablet Take 1 tablet (100 mg total) by mouth daily. 4 tablet 0  . hydrochlorothiazide (HYDRODIURIL) 12.5 MG tablet Take 1 tablet (12.5 mg total) by mouth daily. 90 tablet 1  . lidocaine-prilocaine (EMLA) cream Apply cream 1 hour before chemotherapy treatment, place saran wrap over the cream to avoid getting on clothes. 30 g 1  . loratadine (CLARITIN) 10 MG tablet Take 10 mg by mouth daily.    Marland Kitchen POTASSIUM PO Take 1,000 Units by mouth daily.    . promethazine (PHENERGAN) 25 MG tablet Take 1 tablet (25 mg total) by mouth every 6 (six) hours as needed for nausea or vomiting. 60 tablet 2   No current facility-administered medications for this visit.   Facility-Administered Medications Ordered in Other Visits  Medication Dose Route Frequency Provider Last Rate Last Dose  . heparin lock flush 100 unit/mL  500 Units Intravenous Once Cammie Sickle, MD      . sodium chloride 0.9 % injection 10 mL  10 mL Intravenous PRN Leia Alf, MD   10 mL at 11/10/14 1138  . sodium chloride 0.9 % injection 10 mL  10 mL Intravenous PRN Cammie Sickle, MD   10 mL at 01/26/15 0909    PHYSICAL EXAMINATION: ECOG PERFORMANCE STATUS: 1 - Symptomatic but completely ambulatory  BP 135/78 mmHg  Pulse 97  Temp(Src) 97.4 F (36.3 C) (Tympanic)  Resp 18  Ht 5\' 5"  (1.651 m)  Wt 203 lb 0.7 oz (92.1 kg)  BMI 33.79 kg/m2  Filed Weights   01/26/15 0948  Weight: 203 lb 0.7 oz (92.1 kg)    GENERAL:  Well-nourished well-developed; Alert, no distress and comfortable.   Accompanied by aunt.  EYES: no pallor or icterus OROPHARYNX: no thrush or ulceration; good dentition  NECK: supple, no masses felt LYMPH:  no palpable lymphadenopathy in the cervical, axillary or inguinal regions LUNGS: clear to auscultation and  No wheeze or crackles HEART/CVS: regular rate & rhythm and no murmurs; No lower extremity edema ABDOMEN:abdomen soft, non-tender and normal bowel sounds Musculoskeletal:no cyanosis of digits and no clubbing  PSYCH: alert & oriented x 3 with fluent speech NEURO: no focal motor/sensory deficits SKIN:  no rashes or significant lesions; Mediport in place no erythema and no discharge no tenderness.  LABORATORY DATA:  I have reviewed the data as listed    Component Value Date/Time   NA 137 01/26/2015 0909   NA 140 07/16/2011 2130   K 3.4* 01/26/2015 0909   K 3.7 07/16/2011 2130   CL 106 01/26/2015 0909   CL 106 07/16/2011 2130   CO2 25 01/26/2015 0909   CO2 27 07/16/2011 2130   GLUCOSE 129* 01/26/2015 0909   GLUCOSE 93 07/16/2011 2130   BUN 16 01/26/2015 0909   BUN 16 07/16/2011 2130   CREATININE 0.84 01/26/2015 0909   CREATININE 0.95 07/16/2011 2130   CALCIUM 8.8* 01/26/2015 0909   CALCIUM 9.0 07/16/2011 2130   PROT 6.9 01/26/2015 0909   ALBUMIN 3.7 01/26/2015 0909   AST 28 01/26/2015 0909   ALT 20 01/26/2015 0909   ALKPHOS 53 01/26/2015 0909   BILITOT 0.4 01/26/2015 0909   GFRNONAA >60 01/26/2015 0909   GFRNONAA >60 07/16/2011 2130   GFRAA >60 01/26/2015 0909   GFRAA >60 07/16/2011 2130    No results found for: SPEP, UPEP  Lab Results  Component Value Date   WBC 3.1* 01/26/2015   NEUTROABS 1.5 01/26/2015   HGB 7.8* 01/26/2015   HCT 23.2* 01/26/2015   MCV 93.9 01/26/2015   PLT 263 01/26/2015      Chemistry      Component Value Date/Time   NA 137 01/26/2015 0909   NA 140 07/16/2011 2130   K 3.4* 01/26/2015 0909   K 3.7 07/16/2011 2130   CL 106  01/26/2015 0909   CL 106 07/16/2011 2130   CO2 25 01/26/2015 0909   CO2 27 07/16/2011 2130   BUN 16 01/26/2015 0909   BUN 16 07/16/2011 2130   CREATININE 0.84 01/26/2015 0909   CREATININE 0.95 07/16/2011 2130      Component Value Date/Time   CALCIUM 8.8* 01/26/2015 0909   CALCIUM 9.0 07/16/2011 2130   ALKPHOS 53 01/26/2015 0909   AST 28 01/26/2015 0909   ALT 20 01/26/2015 0909   BILITOT 0.4 01/26/2015 0909       RADIOGRAPHIC STUDIES: I have personally reviewed the radiological images as listed and agreed with the findings in the report. No results found.   ASSESSMENT & PLAN:   # RIGHT BREAST CANCER STAGE I - triple negative; on adjuvant chemotherapy Adriamycin and Cytoxan status post cycle #3.  Patient tolerating chemotherapy fairly well without any major adverse effects except for severe anemia [C discussion below]  # Labs reviewed today adequate-except for symptomatic anemia. Proceed with cycle #4.  # Severe syndrome symptomatic anemia with hemoglobin 7.8- proceed with PRBC transfusion 1 unit.   # Patient follow-up with me approximately 3 weeks/ prior to starting Taxol No. 1.   # Recommend follow-up labs in 2 weeks/3 weeks.    No orders of the defined types were placed in this encounter.   All questions were answered. The patient knows to call the clinic with any problems, questions or concerns. No barriers to learning was detected.  I spent 25 minutes counseling the patient face to face. The total time spent in the appointment was 30 minutes and more than 50% was on counseling and review of test results     Cammie Sickle, MD 01/26/2015 10:18 AM

## 2015-01-26 NOTE — Progress Notes (Signed)
Hgb: 7.8. MD, Dr. Rogue Bussing, notified via telephone. Per MD, Dr. Rogue Bussing, order: proceed with chemotherapy treatment today. Patient to return to clinic on Monday for a blood transfusion.

## 2015-01-26 NOTE — Progress Notes (Signed)
Patient is here for follow-up of breast cancer and AC treatment. This is last [facility] treatment. Patient states that her appetite has not been that great, but she is forcing herself to eat. Patient denies any pain today.

## 2015-01-29 ENCOUNTER — Inpatient Hospital Stay: Payer: 59

## 2015-01-29 VITALS — BP 110/73 | HR 78 | Temp 96.4°F | Resp 20

## 2015-01-29 DIAGNOSIS — D6481 Anemia due to antineoplastic chemotherapy: Secondary | ICD-10-CM

## 2015-01-29 DIAGNOSIS — T451X5A Adverse effect of antineoplastic and immunosuppressive drugs, initial encounter: Principal | ICD-10-CM

## 2015-01-29 DIAGNOSIS — C50911 Malignant neoplasm of unspecified site of right female breast: Secondary | ICD-10-CM | POA: Diagnosis not present

## 2015-01-29 MED ORDER — DIPHENHYDRAMINE HCL 25 MG PO CAPS
25.0000 mg | ORAL_CAPSULE | Freq: Once | ORAL | Status: AC
  Administered 2015-01-29: 25 mg via ORAL
  Filled 2015-01-29: qty 1

## 2015-01-29 MED ORDER — ACETAMINOPHEN 325 MG PO TABS
650.0000 mg | ORAL_TABLET | Freq: Once | ORAL | Status: AC
  Administered 2015-01-29: 650 mg via ORAL
  Filled 2015-01-29: qty 2

## 2015-01-29 MED ORDER — SODIUM CHLORIDE 0.9 % IJ SOLN
10.0000 mL | INTRAMUSCULAR | Status: DC | PRN
  Filled 2015-01-29: qty 10

## 2015-01-29 MED ORDER — HEPARIN SOD (PORK) LOCK FLUSH 100 UNIT/ML IV SOLN
500.0000 [IU] | Freq: Every day | INTRAVENOUS | Status: AC | PRN
  Administered 2015-01-29: 500 [IU]
  Filled 2015-01-29: qty 5

## 2015-01-29 MED ORDER — SODIUM CHLORIDE 0.9 % IV SOLN
250.0000 mL | Freq: Once | INTRAVENOUS | Status: AC
  Administered 2015-01-29: 250 mL via INTRAVENOUS
  Filled 2015-01-29: qty 250

## 2015-01-30 LAB — TYPE AND SCREEN
ABO/RH(D): O POS
Antibody Screen: NEGATIVE
UNIT DIVISION: 0

## 2015-02-06 ENCOUNTER — Other Ambulatory Visit: Payer: Self-pay | Admitting: *Deleted

## 2015-02-06 ENCOUNTER — Telehealth: Payer: Self-pay | Admitting: *Deleted

## 2015-02-06 DIAGNOSIS — C50912 Malignant neoplasm of unspecified site of left female breast: Secondary | ICD-10-CM

## 2015-02-06 NOTE — Telephone Encounter (Signed)
Pt called stating that she got blood transfusion and thought she would feel better but she is so tired, can't stand very long and has to sit. She has no energy.  She has blood work on 12/2 and will that be long enough from blood transfusion to get accurate count.  i called and spoke to [facility] and she told me that Dr. b said to bring her in today for blood work and then we can make decision about how her counts are. Concerned for maybe nadir since her treatment.  Called pt and she states she is eating poorly but drinking fluids.  She states all food tastes salty.  I told her that I think she is feeling side effects of chemo and she got chemo before blood and maybe the side effects of chemo have kept her feeling bad.  I asked her about coming over for blood work today and she said not today she will come in am and wants to come at 9 am.  I told her ok.  Also we will be ordering a cbc, met c, and xtra tube just in case hgb low again and she is agreeable to this.  She also has paperwork to fill out for [facility]. I told her to give to me and I will get Nira Conn to fill it out

## 2015-02-07 ENCOUNTER — Other Ambulatory Visit: Payer: Self-pay | Admitting: *Deleted

## 2015-02-07 ENCOUNTER — Telehealth: Payer: Self-pay | Admitting: *Deleted

## 2015-02-07 ENCOUNTER — Inpatient Hospital Stay: Payer: 59

## 2015-02-07 ENCOUNTER — Other Ambulatory Visit: Payer: Self-pay | Admitting: Internal Medicine

## 2015-02-07 DIAGNOSIS — C50912 Malignant neoplasm of unspecified site of left female breast: Secondary | ICD-10-CM

## 2015-02-07 DIAGNOSIS — C50911 Malignant neoplasm of unspecified site of right female breast: Secondary | ICD-10-CM

## 2015-02-07 DIAGNOSIS — E876 Hypokalemia: Secondary | ICD-10-CM

## 2015-02-07 LAB — CBC WITH DIFFERENTIAL/PLATELET
BASOS ABS: 0 10*3/uL (ref 0–0.1)
Basophils Relative: 1 %
Eosinophils Absolute: 0 10*3/uL (ref 0–0.7)
HEMATOCRIT: 25.8 % — AB (ref 35.0–47.0)
Hemoglobin: 8.9 g/dL — ABNORMAL LOW (ref 12.0–16.0)
Lymphs Abs: 0.5 10*3/uL — ABNORMAL LOW (ref 1.0–3.6)
MCH: 31.6 pg (ref 26.0–34.0)
MCHC: 34.7 g/dL (ref 32.0–36.0)
MCV: 91.2 fL (ref 80.0–100.0)
MONO ABS: 0.4 10*3/uL (ref 0.2–0.9)
NEUTROS ABS: 3.1 10*3/uL (ref 1.4–6.5)
Neutrophils Relative %: 77 %
PLATELETS: 28 10*3/uL — AB (ref 150–440)
RBC: 2.83 MIL/uL — ABNORMAL LOW (ref 3.80–5.20)
RDW: 15.5 % — AB (ref 11.5–14.5)
WBC: 4 10*3/uL (ref 3.6–11.0)

## 2015-02-07 LAB — SAMPLE TO BLOOD BANK

## 2015-02-07 MED ORDER — POTASSIUM CHLORIDE CRYS ER 20 MEQ PO TBCR: 40.0000 meq | EXTENDED_RELEASE_TABLET | Freq: Two times a day (BID) | ORAL | Status: DC

## 2015-02-07 NOTE — Telephone Encounter (Signed)
Spoke with patient. Oral potassium called to patient's pharmacy. Patient instructed to take potassium 20 mEq--2 tablets (40 mEq total) by mouth 2 (two) times daily (40 mEq twice daily x 4 days); then proceed to taking 40 mEq daily x 7 days.   Bleeding precautions initiation. Pt made aware and provided bleed precautions instructions.  plt count 29. Pt instructed to keep lab apt on Friday for recheck labs. hgb stable at this time.  Teach back process performed with patient.    Critical plt count of 29 called to cancer center by [facility], lab tech. [id number] 02/07/15. Read back process performed. Read back process performed with Dr. Rogue Bussing. Md aware of critical potassium level and critical plt count. MD states to have patient keep lab appointments on Friday 02/09/15 and add a bmp to Friday's labs. This was added per md request.

## 2015-02-07 NOTE — Telephone Encounter (Signed)
Potassium 2.9. Notified Dr Agnes Lawrence nurse

## 2015-02-08 LAB — COMPREHENSIVE METABOLIC PANEL
ALT: 43 U/L (ref 14–54)
ANION GAP: 10 (ref 5–15)
AST: 31 U/L (ref 15–41)
Albumin: 3.8 g/dL (ref 3.5–5.0)
Alkaline Phosphatase: 52 U/L (ref 38–126)
BILIRUBIN TOTAL: 0.6 mg/dL (ref 0.3–1.2)
BUN: 17 mg/dL (ref 6–20)
CHLORIDE: 102 mmol/L (ref 101–111)
CO2: 24 mmol/L (ref 22–32)
Calcium: 9.3 mg/dL (ref 8.9–10.3)
Creatinine, Ser: 0.96 mg/dL (ref 0.44–1.00)
Glucose, Bld: 108 mg/dL — ABNORMAL HIGH (ref 65–99)
POTASSIUM: 2.9 mmol/L — AB (ref 3.5–5.1)
Sodium: 136 mmol/L (ref 135–145)
TOTAL PROTEIN: 7.7 g/dL (ref 6.5–8.1)

## 2015-02-09 ENCOUNTER — Inpatient Hospital Stay: Payer: 59 | Attending: Family Medicine

## 2015-02-09 DIAGNOSIS — J45909 Unspecified asthma, uncomplicated: Secondary | ICD-10-CM | POA: Diagnosis not present

## 2015-02-09 DIAGNOSIS — K219 Gastro-esophageal reflux disease without esophagitis: Secondary | ICD-10-CM | POA: Insufficient documentation

## 2015-02-09 DIAGNOSIS — E876 Hypokalemia: Secondary | ICD-10-CM

## 2015-02-09 DIAGNOSIS — Z5111 Encounter for antineoplastic chemotherapy: Secondary | ICD-10-CM | POA: Insufficient documentation

## 2015-02-09 DIAGNOSIS — T451X5S Adverse effect of antineoplastic and immunosuppressive drugs, sequela: Secondary | ICD-10-CM | POA: Diagnosis not present

## 2015-02-09 DIAGNOSIS — Z853 Personal history of malignant neoplasm of breast: Secondary | ICD-10-CM | POA: Diagnosis not present

## 2015-02-09 DIAGNOSIS — D6481 Anemia due to antineoplastic chemotherapy: Secondary | ICD-10-CM | POA: Diagnosis not present

## 2015-02-09 DIAGNOSIS — I10 Essential (primary) hypertension: Secondary | ICD-10-CM | POA: Insufficient documentation

## 2015-02-09 DIAGNOSIS — Z7982 Long term (current) use of aspirin: Secondary | ICD-10-CM | POA: Diagnosis not present

## 2015-02-09 DIAGNOSIS — Z79899 Other long term (current) drug therapy: Secondary | ICD-10-CM | POA: Insufficient documentation

## 2015-02-09 DIAGNOSIS — Z171 Estrogen receptor negative status [ER-]: Secondary | ICD-10-CM | POA: Insufficient documentation

## 2015-02-09 DIAGNOSIS — T451X5A Adverse effect of antineoplastic and immunosuppressive drugs, initial encounter: Secondary | ICD-10-CM

## 2015-02-09 DIAGNOSIS — C50911 Malignant neoplasm of unspecified site of right female breast: Secondary | ICD-10-CM | POA: Insufficient documentation

## 2015-02-09 DIAGNOSIS — G629 Polyneuropathy, unspecified: Secondary | ICD-10-CM | POA: Insufficient documentation

## 2015-02-09 LAB — CBC WITH DIFFERENTIAL/PLATELET
BASOS ABS: 0 10*3/uL (ref 0–0.1)
BASOS PCT: 0 %
EOS ABS: 0 10*3/uL (ref 0–0.7)
Eosinophils Relative: 0 %
HEMATOCRIT: 24.5 % — AB (ref 35.0–47.0)
Hemoglobin: 8.3 g/dL — ABNORMAL LOW (ref 12.0–16.0)
Lymphocytes Relative: 9 %
Lymphs Abs: 0.7 10*3/uL — ABNORMAL LOW (ref 1.0–3.6)
MCH: 31.3 pg (ref 26.0–34.0)
MCHC: 34 g/dL (ref 32.0–36.0)
MCV: 92.1 fL (ref 80.0–100.0)
MONO ABS: 1.1 10*3/uL — AB (ref 0.2–0.9)
Monocytes Relative: 14 %
NEUTROS ABS: 6.2 10*3/uL (ref 1.4–6.5)
Neutrophils Relative %: 77 %
PLATELETS: 56 10*3/uL — AB (ref 150–440)
RBC: 2.66 MIL/uL — ABNORMAL LOW (ref 3.80–5.20)
RDW: 16.5 % — AB (ref 11.5–14.5)
WBC: 8.1 10*3/uL (ref 3.6–11.0)

## 2015-02-09 LAB — BASIC METABOLIC PANEL
Anion gap: 7 (ref 5–15)
BUN: 13 mg/dL (ref 6–20)
CALCIUM: 9.2 mg/dL (ref 8.9–10.3)
CO2: 25 mmol/L (ref 22–32)
CREATININE: 0.89 mg/dL (ref 0.44–1.00)
Chloride: 105 mmol/L (ref 101–111)
GFR calc Af Amer: >60 mL/min (ref >60)
Glucose, Bld: 110 mg/dL — ABNORMAL HIGH (ref 65–99)
POTASSIUM: 4.2 mmol/L (ref 3.5–5.1)
SODIUM: 137 mmol/L (ref 135–145)

## 2015-02-09 LAB — SAMPLE TO BLOOD BANK

## 2015-02-16 ENCOUNTER — Inpatient Hospital Stay: Payer: 59

## 2015-02-16 ENCOUNTER — Inpatient Hospital Stay (HOSPITAL_BASED_OUTPATIENT_CLINIC_OR_DEPARTMENT_OTHER): Payer: 59 | Admitting: Internal Medicine

## 2015-02-16 VITALS — BP 137/81 | HR 90 | Temp 97.2°F | Resp 18 | Wt 195.1 lb

## 2015-02-16 DIAGNOSIS — E876 Hypokalemia: Secondary | ICD-10-CM | POA: Diagnosis not present

## 2015-02-16 DIAGNOSIS — T451X5A Adverse effect of antineoplastic and immunosuppressive drugs, initial encounter: Principal | ICD-10-CM

## 2015-02-16 DIAGNOSIS — D6481 Anemia due to antineoplastic chemotherapy: Secondary | ICD-10-CM

## 2015-02-16 DIAGNOSIS — Z853 Personal history of malignant neoplasm of breast: Secondary | ICD-10-CM

## 2015-02-16 DIAGNOSIS — C50911 Malignant neoplasm of unspecified site of right female breast: Secondary | ICD-10-CM

## 2015-02-16 DIAGNOSIS — T451X5S Adverse effect of antineoplastic and immunosuppressive drugs, sequela: Secondary | ICD-10-CM

## 2015-02-16 DIAGNOSIS — Z79899 Other long term (current) drug therapy: Secondary | ICD-10-CM

## 2015-02-16 DIAGNOSIS — C50912 Malignant neoplasm of unspecified site of left female breast: Secondary | ICD-10-CM

## 2015-02-16 DIAGNOSIS — Z171 Estrogen receptor negative status [ER-]: Secondary | ICD-10-CM

## 2015-02-16 LAB — CBC WITH DIFFERENTIAL/PLATELET
BASOS ABS: 0 10*3/uL (ref 0–0.1)
BASOS PCT: 1 %
EOS ABS: 0 10*3/uL (ref 0–0.7)
Eosinophils Relative: 0 %
HEMATOCRIT: 24.7 % — AB (ref 35.0–47.0)
HEMOGLOBIN: 8.2 g/dL — AB (ref 12.0–16.0)
Lymphocytes Relative: 11 %
Lymphs Abs: 0.7 10*3/uL — ABNORMAL LOW (ref 1.0–3.6)
MCH: 31.2 pg (ref 26.0–34.0)
MCHC: 33.3 g/dL (ref 32.0–36.0)
MCV: 93.9 fL (ref 80.0–100.0)
MONOS PCT: 18 %
Monocytes Absolute: 1.1 10*3/uL — ABNORMAL HIGH (ref 0.2–0.9)
NEUTROS ABS: 4.4 10*3/uL (ref 1.4–6.5)
NEUTROS PCT: 70 %
Platelets: 234 10*3/uL (ref 150–440)
RBC: 2.63 MIL/uL — AB (ref 3.80–5.20)
RDW: 16.5 % — ABNORMAL HIGH (ref 11.5–14.5)
WBC: 6.2 10*3/uL (ref 3.6–11.0)

## 2015-02-16 LAB — COMPREHENSIVE METABOLIC PANEL
ALBUMIN: 3.8 g/dL (ref 3.5–5.0)
ALK PHOS: 61 U/L (ref 38–126)
ALT: 21 U/L (ref 14–54)
ANION GAP: 7 (ref 5–15)
AST: 23 U/L (ref 15–41)
BILIRUBIN TOTAL: 0.1 mg/dL — AB (ref 0.3–1.2)
BUN: 17 mg/dL (ref 6–20)
CALCIUM: 9.2 mg/dL (ref 8.9–10.3)
CO2: 28 mmol/L (ref 22–32)
Chloride: 102 mmol/L (ref 101–111)
Creatinine, Ser: 0.87 mg/dL (ref 0.44–1.00)
GFR calc Af Amer: >60 mL/min (ref >60)
GFR calc non Af Amer: >60 mL/min (ref >60)
GLUCOSE: 143 mg/dL — AB (ref 65–99)
Potassium: 3.3 mmol/L — ABNORMAL LOW (ref 3.5–5.1)
SODIUM: 137 mmol/L (ref 135–145)
TOTAL PROTEIN: 7.3 g/dL (ref 6.5–8.1)

## 2015-02-16 LAB — SAMPLE TO BLOOD BANK

## 2015-02-16 MED ORDER — FAMOTIDINE IN NACL 20-0.9 MG/50ML-% IV SOLN
20.0000 mg | Freq: Once | INTRAVENOUS | Status: AC
  Administered 2015-02-16: 20 mg via INTRAVENOUS
  Filled 2015-02-16: qty 50

## 2015-02-16 MED ORDER — HEPARIN SOD (PORK) LOCK FLUSH 100 UNIT/ML IV SOLN
500.0000 [IU] | Freq: Once | INTRAVENOUS | Status: AC | PRN
  Administered 2015-02-16: 500 [IU]
  Filled 2015-02-16: qty 5

## 2015-02-16 MED ORDER — PACLITAXEL CHEMO INJECTION 300 MG/50ML
80.0000 mg/m2 | Freq: Once | INTRAVENOUS | Status: AC
  Administered 2015-02-16: 162 mg via INTRAVENOUS
  Filled 2015-02-16: qty 27

## 2015-02-16 MED ORDER — DIPHENHYDRAMINE HCL 50 MG/ML IJ SOLN
50.0000 mg | Freq: Once | INTRAMUSCULAR | Status: AC
  Administered 2015-02-16: 50 mg via INTRAVENOUS
  Filled 2015-02-16: qty 1

## 2015-02-16 MED ORDER — SODIUM CHLORIDE 0.9 % IV SOLN
Freq: Once | INTRAVENOUS | Status: AC
  Administered 2015-02-16: 11:00:00 via INTRAVENOUS
  Filled 2015-02-16: qty 4

## 2015-02-16 MED ORDER — SODIUM CHLORIDE 0.9 % IV SOLN
Freq: Once | INTRAVENOUS | Status: AC
  Administered 2015-02-16: 11:00:00 via INTRAVENOUS
  Filled 2015-02-16: qty 1000

## 2015-02-16 MED ORDER — SODIUM CHLORIDE 0.9 % IJ SOLN
10.0000 mL | INTRAMUSCULAR | Status: DC | PRN
  Administered 2015-02-16: 10 mL
  Filled 2015-02-16: qty 10

## 2015-02-16 NOTE — Progress Notes (Signed)
Patient offers no complaints today.  Is requesting Handicap sticker for her car.

## 2015-02-16 NOTE — Progress Notes (Signed)
[place] OFFICE PROGRESS NOTE  Patient Care Team: Adrienne Maple, MD as PCP - General (Unknown Physician Specialty) Robert Bellow, MD (General Surgery) Adrienne Maple, MD (Family Medicine)   SUMMARY OF ONCOLOGIC HISTORY:  # MAY 2016- RIGHT BREAST CANCER- Triple negative [T=0.5cm;G-3; margins-Neg]; Mammaprint- 0.814/molecular basal type [5 year risk- 22%; 10 year risk-29%] AC q3 W- taxol q w x 12  # 2012 LEFT BREAST CA STAGE I s/p Lumpec [T1 (0.7CM);Triple Neg; G-3]; s/p mammosite RT; No adj chemo  #  Mild Anemia/M-protein 0.8gm/dl [sep 2016]   INTERVAL HISTORY:  [name] patient with above history of stage I right-sided triple negative breast cancer currently on adjuvant chemotherapy with Adriamycin and Cytoxan status post 4 cycles is here for follow-up.  Patient's leg cramps have significantly improved after using potassium. Patient's potassium is critically low at 2.8 approximately 1 weeks ago.  Denies any leg cramps at this time. Denies any rash and palms and soles. Continues to have mild fatigue. Otherwise no nausea no vomiting. Otherwise no chest pain shortness of breath or cough.  REVIEW OF SYSTEMS:  A complete 10 point review of system is done which is negative except mentioned above/history of present illness.   PAST MEDICAL HISTORY :  Past Medical History  Diagnosis Date  . Seasonal allergies   . Unspecified essential hypertension   . Asthma   . 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 Dry eyes 2012  . Breast screening, unspecified   . Lump or mass in breast   . Special screening for malignant neoplasms, colon   . Obesity, unspecified   . Screening for obesity   . Breast cyst 2010    cyst- drained  . Constipation   . Kidney stone    . GERD (gastroesophageal reflux disease)   . Anemia   . Malignant neoplasm of breast (female), unspecified site 07/08/2014    Right breast, 5 mm, T1a,N0, triple negative; high risk for recurrence on Mammoprint.  . Malignant neoplasm of upper-outer quadrant of female breast ([place]) 05/08/2010    Left breast: T1b, N0, M); triple negative, DCIS present. No chemotherapy. MammoSite radiation.  . Breast cancer ([place])   . Hematuria   . Pancreatic lesion     PAST SURGICAL HISTORY :   Past Surgical History  Procedure Laterality Date  . Cesarean section  1988  . Colonoscopy  2011    Dr. Dionne Milo  . Breast mammosite  April 2012    mammosite placement and removal   . Kidney stone surgery  2003    stones removed by Dr. Quillian Quince- stent placed/removed  . Tubal ligation    . Breast lumpectomy with axillary lymph node biopsy Right 08/01/2014    Procedure: BREAST LUMPECTOMY WITH AXILLARY LYMPH NODE BIOPSY;  Surgeon: Robert Bellow, MD;  Location: [facility] ORS;  Service: General;  Laterality: Right;  . Sentinel node biopsy Right 08/01/2014    Procedure: SENTINEL NODE BIOPSY;  Surgeon: Robert Bellow, MD;  Location: [facility] ORS;  Service: General;  Laterality: Right;  . Breast surgery Left 2012    left breast wide excision, sentinel node, MammoSite  . Breast cyst aspiration  2010  . Breast surgery Right 08/06/2014    Wide excision/sentinel node biopsy, T1a, N0 triple negative, MammoSite  .  Portacath placement Left 11/09/2014    Procedure: INSERTION PORT-A-CATH;  Surgeon: Robert Bellow, MD;  Location: [facility] ORS;  Service: General;  Laterality: Left;    FAMILY HISTORY :   Family History  Problem Relation Age of Onset  . Ovarian cancer Mother   . Diabetes Mother   . Cancer Father     prostate  . Diabetes Father     SOCIAL HISTORY:   Social History  Substance Use Topics  . Smoking status: Never Smoker   . Smokeless tobacco: Never Used  . Alcohol Use: Yes     Comment: occasionally     ALLERGIES:  is allergic to shellfish allergy and tape.  MEDICATIONS:  Current Outpatient Prescriptions  Medication Sig Dispense Refill  . albuterol (PROVENTIL HFA;VENTOLIN HFA) 108 (90 BASE) MCG/ACT inhaler Inhale 2 puffs into the lungs every 6 (six) hours as needed for wheezing or shortness of breath.    Marland Kitchen aspirin 81 MG tablet Take 81 mg by mouth daily.    . Cholecalciferol (VITAMIN D3) 1000 UNITS CAPS Take 1 capsule by mouth daily.     Marland Kitchen esomeprazole (NEXIUM) 40 MG capsule Take 40 mg by mouth 2 (two) times daily as needed (acid reflux).     . fluconazole (DIFLUCAN) 100 MG tablet Take 1 tablet (100 mg total) by mouth daily. 4 tablet 0  . hydrochlorothiazide (HYDRODIURIL) 12.5 MG tablet Take 1 tablet (12.5 mg total) by mouth daily. 90 tablet 1  . lidocaine-prilocaine (EMLA) cream Apply cream 1 hour before chemotherapy treatment, place saran wrap over the cream to avoid getting on clothes. 30 g 1  . loratadine (CLARITIN) 10 MG tablet Take 10 mg by mouth daily.    . potassium chloride SA (K-DUR,KLOR-CON) 20 MEQ tablet Take 2 tablets (40 mEq total) by mouth 2 (two) times daily. 40 mEq twice daily x 4 days; then 40 mEq daily x 7 days 22 tablet 0  . promethazine (PHENERGAN) 25 MG tablet Take 1 tablet (25 mg total) by mouth every 6 (six) hours as needed for nausea or vomiting. 60 tablet 2   No current facility-administered medications for this visit.   Facility-Administered Medications Ordered in Other Visits  Medication Dose Route Frequency Provider Last Rate Last Dose  . sodium chloride 0.9 % injection 10 mL  10 mL Intravenous PRN Leia Alf, MD   10 mL at 11/10/14 1138  . sodium chloride 0.9 % injection 10 mL  10 mL Intracatheter PRN Cammie Sickle, MD   10 mL at 02/16/15 0914    PHYSICAL EXAMINATION: ECOG PERFORMANCE STATUS: 1 - Symptomatic but completely ambulatory  BP 137/81 mmHg  Pulse 90  Temp(Src) 97.2 F (36.2 C) (Tympanic)  Resp 18  Wt 195 lb 1.7 oz (88.5  kg)  Filed Weights   02/16/15 0938  Weight: 195 lb 1.7 oz (88.5 kg)    GENERAL: Well-nourished well-developed; Alert, no distress and comfortable.   Accompanied by aunt.  EYES: no pallor or icterus OROPHARYNX: no thrush or ulceration; good dentition  NECK: supple, no masses felt LYMPH:  no palpable lymphadenopathy in the cervical, axillary or inguinal regions LUNGS: clear to auscultation and  No wheeze or crackles HEART/CVS: regular rate & rhythm and no murmurs; No lower extremity edema ABDOMEN:abdomen soft, non-tender and normal bowel sounds Musculoskeletal:no cyanosis of digits and no clubbing  PSYCH: alert & oriented x 3 with fluent speech NEURO: no focal motor/sensory deficits SKIN:  no rashes or significant lesions; Mediport in place no erythema and  no discharge no tenderness.  LABORATORY DATA:  I have reviewed the data as listed    Component Value Date/Time   NA 137 02/16/2015 0903   NA 140 07/16/2011 2130   K 3.3* 02/16/2015 0903   K 3.7 07/16/2011 2130   CL 102 02/16/2015 0903   CL 106 07/16/2011 2130   CO2 28 02/16/2015 0903   CO2 27 07/16/2011 2130   GLUCOSE 143* 02/16/2015 0903   GLUCOSE 93 07/16/2011 2130   BUN 17 02/16/2015 0903   BUN 16 07/16/2011 2130   CREATININE 0.87 02/16/2015 0903   CREATININE 0.95 07/16/2011 2130   CALCIUM 9.2 02/16/2015 0903   CALCIUM 9.0 07/16/2011 2130   PROT 7.3 02/16/2015 0903   ALBUMIN 3.8 02/16/2015 0903   AST 23 02/16/2015 0903   ALT 21 02/16/2015 0903   ALKPHOS 61 02/16/2015 0903   BILITOT 0.1* 02/16/2015 0903   GFRNONAA >60 02/16/2015 0903   GFRNONAA >60 07/16/2011 2130   GFRAA >60 02/16/2015 0903   GFRAA >60 07/16/2011 2130    No results found for: SPEP, UPEP  Lab Results  Component Value Date   WBC 6.2 02/16/2015   NEUTROABS 4.4 02/16/2015   HGB 8.2* 02/16/2015   HCT 24.7* 02/16/2015   MCV 93.9 02/16/2015   PLT 234 02/16/2015      Chemistry      Component Value Date/Time   NA 137 02/16/2015 0903   NA  140 07/16/2011 2130   K 3.3* 02/16/2015 0903   K 3.7 07/16/2011 2130   CL 102 02/16/2015 0903   CL 106 07/16/2011 2130   CO2 28 02/16/2015 0903   CO2 27 07/16/2011 2130   BUN 17 02/16/2015 0903   BUN 16 07/16/2011 2130   CREATININE 0.87 02/16/2015 0903   CREATININE 0.95 07/16/2011 2130      Component Value Date/Time   CALCIUM 9.2 02/16/2015 0903   CALCIUM 9.0 07/16/2011 2130   ALKPHOS 61 02/16/2015 0903   AST 23 02/16/2015 0903   ALT 21 02/16/2015 0903   BILITOT 0.1* 02/16/2015 0903       RADIOGRAPHIC STUDIES: I have personally reviewed the radiological images as listed and agreed with the findings in the report. No results found.   ASSESSMENT & PLAN:   # RIGHT BREAST CANCER STAGE I - triple negative; on adjuvant chemotherapy Adriamycin and Cytoxan status post cycle #4.  Patient tolerating chemotherapy fairly well without any major adverse effects except for severe anemia [C discussion below].  # proceed with weekly Taxol starting today. Labs reviewed adequate except for hemoglobin of 8.1 [see discussion below]I again reviewed the potential side effects of Taxol including but not limited to neuropathy with the patient.  # Severe hypokalemia status post improvement. Today potassium is 3.3.  # prior history of severe anemia from chemotherapy status post transfusion approximately 2 weeks ago. Today hemoglobin is 8.1. Monitor for now if getting worse than recommend a unit of PRBC transfusion.  # patient will get weekly labs; follow-up with  Covering physician in 3 weeks; follow-up with me in 6 weeks.  Orders Placed This Encounter  Procedures  . CBC with Differential    Standing Status: Standing     Number of Occurrences: 6     Standing Expiration Date: 02/16/2016  . Comprehensive metabolic panel    Standing Status: Standing     Number of Occurrences: 6     Standing Expiration Date: 02/16/2016   All questions were answered. The patient knows to call the  clinic with any  problems, questions or concerns. No barriers to learning was detected.  I spent 25 minutes counseling the patient face to face. The total time spent in the appointment was 30 minutes and more than 50% was on counseling and review of test results     Cammie Sickle, MD 02/16/2015 1:01 PM

## 2015-02-23 ENCOUNTER — Inpatient Hospital Stay: Payer: 59

## 2015-02-23 ENCOUNTER — Other Ambulatory Visit: Payer: Self-pay | Admitting: *Deleted

## 2015-02-23 ENCOUNTER — Telehealth: Payer: Self-pay | Admitting: *Deleted

## 2015-02-23 VITALS — BP 121/81 | HR 90 | Temp 98.0°F | Resp 18

## 2015-02-23 DIAGNOSIS — C50912 Malignant neoplasm of unspecified site of left female breast: Secondary | ICD-10-CM

## 2015-02-23 DIAGNOSIS — E876 Hypokalemia: Secondary | ICD-10-CM

## 2015-02-23 DIAGNOSIS — C50911 Malignant neoplasm of unspecified site of right female breast: Secondary | ICD-10-CM

## 2015-02-23 LAB — COMPREHENSIVE METABOLIC PANEL
ALT: 36 U/L (ref 14–54)
AST: 30 U/L (ref 15–41)
Albumin: 3.9 g/dL (ref 3.5–5.0)
Alkaline Phosphatase: 57 U/L (ref 38–126)
Anion gap: 7 (ref 5–15)
BILIRUBIN TOTAL: 0.3 mg/dL (ref 0.3–1.2)
BUN: 19 mg/dL (ref 6–20)
CALCIUM: 9.2 mg/dL (ref 8.9–10.3)
CO2: 27 mmol/L (ref 22–32)
CREATININE: 0.8 mg/dL (ref 0.44–1.00)
Chloride: 101 mmol/L (ref 101–111)
Glucose, Bld: 138 mg/dL — ABNORMAL HIGH (ref 65–99)
Potassium: 3.2 mmol/L — ABNORMAL LOW (ref 3.5–5.1)
Sodium: 135 mmol/L (ref 135–145)
TOTAL PROTEIN: 7.2 g/dL (ref 6.5–8.1)

## 2015-02-23 LAB — CBC WITH DIFFERENTIAL/PLATELET
BASOS ABS: 0 10*3/uL (ref 0–0.1)
Basophils Relative: 1 %
Eosinophils Absolute: 0.1 10*3/uL (ref 0–0.7)
Eosinophils Relative: 1 %
HEMATOCRIT: 22.8 % — AB (ref 35.0–47.0)
Hemoglobin: 7.6 g/dL — ABNORMAL LOW (ref 12.0–16.0)
LYMPHS ABS: 0.6 10*3/uL — AB (ref 1.0–3.6)
LYMPHS PCT: 16 %
MCH: 31.3 pg (ref 26.0–34.0)
MCHC: 33.2 g/dL (ref 32.0–36.0)
MCV: 94.4 fL (ref 80.0–100.0)
MONO ABS: 0.5 10*3/uL (ref 0.2–0.9)
Monocytes Relative: 14 %
NEUTROS ABS: 2.5 10*3/uL (ref 1.4–6.5)
Neutrophils Relative %: 68 %
Platelets: 239 10*3/uL (ref 150–440)
RBC: 2.42 MIL/uL — AB (ref 3.80–5.20)
RDW: 17.3 % — ABNORMAL HIGH (ref 11.5–14.5)
WBC: 3.7 10*3/uL (ref 3.6–11.0)

## 2015-02-23 LAB — PREPARE RBC (CROSSMATCH)

## 2015-02-23 MED ORDER — FAMOTIDINE IN NACL 20-0.9 MG/50ML-% IV SOLN
20.0000 mg | Freq: Once | INTRAVENOUS | Status: AC
  Administered 2015-02-23: 20 mg via INTRAVENOUS
  Filled 2015-02-23: qty 50

## 2015-02-23 MED ORDER — SODIUM CHLORIDE 0.9 % IJ SOLN
10.0000 mL | INTRAMUSCULAR | Status: DC | PRN
  Administered 2015-02-23: 10 mL
  Filled 2015-02-23: qty 10

## 2015-02-23 MED ORDER — POTASSIUM CHLORIDE CRYS ER 20 MEQ PO TBCR: 40.0000 meq | EXTENDED_RELEASE_TABLET | Freq: Two times a day (BID) | ORAL | Status: DC

## 2015-02-23 MED ORDER — DIPHENHYDRAMINE HCL 50 MG/ML IJ SOLN
50.0000 mg | Freq: Once | INTRAMUSCULAR | Status: AC
  Administered 2015-02-23: 50 mg via INTRAVENOUS
  Filled 2015-02-23: qty 1

## 2015-02-23 MED ORDER — HEPARIN SOD (PORK) LOCK FLUSH 100 UNIT/ML IV SOLN
500.0000 [IU] | Freq: Once | INTRAVENOUS | Status: AC | PRN
  Administered 2015-02-23: 500 [IU]
  Filled 2015-02-23: qty 5

## 2015-02-23 MED ORDER — SODIUM CHLORIDE 0.9 % IV SOLN
Freq: Once | INTRAVENOUS | Status: AC
  Administered 2015-02-23: 11:00:00 via INTRAVENOUS
  Filled 2015-02-23: qty 4

## 2015-02-23 MED ORDER — PACLITAXEL CHEMO INJECTION 300 MG/50ML
80.0000 mg/m2 | Freq: Once | INTRAVENOUS | Status: AC
  Administered 2015-02-23: 162 mg via INTRAVENOUS
  Filled 2015-02-23: qty 27

## 2015-02-23 MED ORDER — SODIUM CHLORIDE 0.9 % IV SOLN
Freq: Once | INTRAVENOUS | Status: AC
  Administered 2015-02-23: 10:00:00 via INTRAVENOUS
  Filled 2015-02-23: qty 1000

## 2015-02-23 NOTE — Telephone Encounter (Signed)
hgb 7.6 and dr B. Michela Pitcher pt will need 1 unit of blood and have set up for Monday 1:30.  Type and screen being done today. Pt agreeable to plan for 1 unit on monday

## 2015-02-23 NOTE — Progress Notes (Signed)
Hgb: 7.6. MD, Dr. Rogue Bussing, notified via telephone and aware. Per MD, Dr. Rogue Bussing, order: proceed with chemotherapy treatment today.

## 2015-02-26 ENCOUNTER — Inpatient Hospital Stay: Payer: 59

## 2015-02-26 VITALS — BP 117/81 | HR 74 | Temp 98.2°F | Resp 17

## 2015-02-26 DIAGNOSIS — C50912 Malignant neoplasm of unspecified site of left female breast: Secondary | ICD-10-CM

## 2015-02-26 DIAGNOSIS — C50911 Malignant neoplasm of unspecified site of right female breast: Secondary | ICD-10-CM | POA: Diagnosis not present

## 2015-02-26 MED ORDER — SODIUM CHLORIDE 0.9 % IV SOLN
250.0000 mL | Freq: Once | INTRAVENOUS | Status: AC
  Administered 2015-02-26: 250 mL via INTRAVENOUS
  Filled 2015-02-26: qty 250

## 2015-02-26 MED ORDER — DIPHENHYDRAMINE HCL 25 MG PO CAPS
25.0000 mg | ORAL_CAPSULE | Freq: Once | ORAL | Status: AC
  Administered 2015-02-26: 25 mg via ORAL
  Filled 2015-02-26: qty 1

## 2015-02-26 MED ORDER — ACETAMINOPHEN 325 MG PO TABS
650.0000 mg | ORAL_TABLET | Freq: Once | ORAL | Status: AC
  Administered 2015-02-26: 650 mg via ORAL
  Filled 2015-02-26: qty 2

## 2015-02-26 MED ORDER — SODIUM CHLORIDE 0.9 % IJ SOLN
10.0000 mL | INTRAMUSCULAR | Status: AC | PRN
  Administered 2015-02-26: 10 mL
  Filled 2015-02-26: qty 10

## 2015-02-26 MED ORDER — HEPARIN SOD (PORK) LOCK FLUSH 100 UNIT/ML IV SOLN
500.0000 [IU] | Freq: Every day | INTRAVENOUS | Status: AC | PRN
  Administered 2015-02-26: 500 [IU]
  Filled 2015-02-26: qty 5

## 2015-02-27 LAB — TYPE AND SCREEN
ABO/RH(D): O POS
ANTIBODY SCREEN: NEGATIVE
Unit division: 0

## 2015-02-28 LAB — PREPARE RBC (CROSSMATCH)

## 2015-03-02 ENCOUNTER — Other Ambulatory Visit: Payer: Self-pay | Admitting: *Deleted

## 2015-03-02 ENCOUNTER — Encounter: Payer: Self-pay | Admitting: *Deleted

## 2015-03-02 ENCOUNTER — Inpatient Hospital Stay: Payer: 59

## 2015-03-02 VITALS — BP 120/80 | HR 76 | Temp 98.0°F | Resp 18

## 2015-03-02 DIAGNOSIS — C50911 Malignant neoplasm of unspecified site of right female breast: Secondary | ICD-10-CM

## 2015-03-02 DIAGNOSIS — C50912 Malignant neoplasm of unspecified site of left female breast: Secondary | ICD-10-CM

## 2015-03-02 DIAGNOSIS — G620 Drug-induced polyneuropathy: Secondary | ICD-10-CM

## 2015-03-02 DIAGNOSIS — T451X5A Adverse effect of antineoplastic and immunosuppressive drugs, initial encounter: Secondary | ICD-10-CM

## 2015-03-02 LAB — CBC WITH DIFFERENTIAL/PLATELET
BASOS ABS: 0 10*3/uL (ref 0–0.1)
Basophils Relative: 1 %
EOS PCT: 2 %
Eosinophils Absolute: 0.1 10*3/uL (ref 0–0.7)
HCT: 25.6 % — ABNORMAL LOW (ref 35.0–47.0)
Hemoglobin: 8.8 g/dL — ABNORMAL LOW (ref 12.0–16.0)
LYMPHS PCT: 16 %
Lymphs Abs: 0.6 10*3/uL — ABNORMAL LOW (ref 1.0–3.6)
MCH: 32.5 pg (ref 26.0–34.0)
MCHC: 34.5 g/dL (ref 32.0–36.0)
MCV: 94.3 fL (ref 80.0–100.0)
Monocytes Absolute: 0.3 10*3/uL (ref 0.2–0.9)
Monocytes Relative: 9 %
NEUTROS ABS: 2.7 10*3/uL (ref 1.4–6.5)
Neutrophils Relative %: 72 %
PLATELETS: 216 10*3/uL (ref 150–440)
RBC: 2.71 MIL/uL — AB (ref 3.80–5.20)
RDW: 16.8 % — ABNORMAL HIGH (ref 11.5–14.5)
WBC: 3.7 10*3/uL (ref 3.6–11.0)

## 2015-03-02 LAB — COMPREHENSIVE METABOLIC PANEL
ALT: 49 U/L (ref 14–54)
AST: 38 U/L (ref 15–41)
Albumin: 4 g/dL (ref 3.5–5.0)
Alkaline Phosphatase: 60 U/L (ref 38–126)
Anion gap: 5 (ref 5–15)
BUN: 18 mg/dL (ref 6–20)
CHLORIDE: 102 mmol/L (ref 101–111)
CO2: 28 mmol/L (ref 22–32)
CREATININE: 0.82 mg/dL (ref 0.44–1.00)
Calcium: 9.1 mg/dL (ref 8.9–10.3)
GFR calc Af Amer: >60 mL/min (ref >60)
GLUCOSE: 125 mg/dL — AB (ref 65–99)
Potassium: 3.4 mmol/L — ABNORMAL LOW (ref 3.5–5.1)
SODIUM: 135 mmol/L (ref 135–145)
Total Bilirubin: 0.6 mg/dL (ref 0.3–1.2)
Total Protein: 7.6 g/dL (ref 6.5–8.1)

## 2015-03-02 MED ORDER — SODIUM CHLORIDE 0.9 % IV SOLN
Freq: Once | INTRAVENOUS | Status: AC
  Administered 2015-03-02: 10:00:00 via INTRAVENOUS
  Filled 2015-03-02: qty 4

## 2015-03-02 MED ORDER — DIPHENHYDRAMINE HCL 50 MG/ML IJ SOLN
50.0000 mg | Freq: Once | INTRAMUSCULAR | Status: AC
  Administered 2015-03-02: 50 mg via INTRAVENOUS
  Filled 2015-03-02: qty 1

## 2015-03-02 MED ORDER — SODIUM CHLORIDE 0.9 % IJ SOLN
10.0000 mL | INTRAMUSCULAR | Status: DC | PRN
  Administered 2015-03-02 (×2): 10 mL
  Filled 2015-03-02: qty 10

## 2015-03-02 MED ORDER — HEPARIN SOD (PORK) LOCK FLUSH 100 UNIT/ML IV SOLN
500.0000 [IU] | Freq: Once | INTRAVENOUS | Status: AC | PRN
  Administered 2015-03-02: 500 [IU]
  Filled 2015-03-02: qty 5

## 2015-03-02 MED ORDER — SODIUM CHLORIDE 0.9 % IV SOLN
Freq: Once | INTRAVENOUS | Status: AC
  Administered 2015-03-02: 10:00:00 via INTRAVENOUS
  Filled 2015-03-02: qty 1000

## 2015-03-02 MED ORDER — FAMOTIDINE IN NACL 20-0.9 MG/50ML-% IV SOLN
20.0000 mg | Freq: Once | INTRAVENOUS | Status: AC
  Administered 2015-03-02: 20 mg via INTRAVENOUS
  Filled 2015-03-02: qty 50

## 2015-03-02 MED ORDER — GABAPENTIN 300 MG PO CAPS: 300.0000 mg | ORAL_CAPSULE | Freq: Three times a day (TID) | ORAL | Status: DC

## 2015-03-02 MED ORDER — PACLITAXEL CHEMO INJECTION 300 MG/50ML
80.0000 mg/m2 | Freq: Once | INTRAVENOUS | Status: AC
  Administered 2015-03-02: 162 mg via INTRAVENOUS
  Filled 2015-03-02: qty 27

## 2015-03-02 NOTE — Progress Notes (Signed)
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 Symptoms occurred starting last week. She states that sometimes the numbness and tingling goes up to left forearm intermittently but improves. The patient has no difficulty picking up objects or buttoning or zipping up her shirt. She denies any neuropathy in her feet. Given the patient is on taxol, Dr. Rudean Hitt (covering provider) sent RX to patient's pharmacy for gabapentin 300 mg capsule orally three times daily. The patient was instructed per v/o md to start with 1 capsule at bedtime for a few weeks to ensure tolerability to the gabapentin and gradually increase the tablet up to three times a day. The patient states that she works third shift, so she will take the gabapentin when she gets off of work. She states that will have someone else drive her to appointments to avoid the side effect of drowsiness if necessary. She does not operate heavy machinery at work and does not drink any alcohol. Teach back process performed in the clinic today.  Approximately 5 minutes spent discussing intervention.

## 2015-03-06 ENCOUNTER — Telehealth: Payer: Self-pay | Admitting: *Deleted

## 2015-03-06 NOTE — Telephone Encounter (Signed)
Per Lanetta Inch, patient called several time this weekend and yesterday on clinical hotline. Patient states that her neurontin was never sent to pharmacy. RX status stated "printed."  I called [facility] to give v/o for neurotin as directed by provider. Patient contacted and made aware the [facility] has the RX. She can go pick up the RX.

## 2015-03-09 ENCOUNTER — Inpatient Hospital Stay (HOSPITAL_BASED_OUTPATIENT_CLINIC_OR_DEPARTMENT_OTHER): Payer: 59 | Admitting: Internal Medicine

## 2015-03-09 ENCOUNTER — Inpatient Hospital Stay: Payer: 59

## 2015-03-09 VITALS — BP 142/83 | HR 98 | Temp 97.1°F | Resp 18 | Wt 195.3 lb

## 2015-03-09 DIAGNOSIS — C50911 Malignant neoplasm of unspecified site of right female breast: Secondary | ICD-10-CM | POA: Diagnosis not present

## 2015-03-09 DIAGNOSIS — G629 Polyneuropathy, unspecified: Secondary | ICD-10-CM

## 2015-03-09 DIAGNOSIS — T451X5S Adverse effect of antineoplastic and immunosuppressive drugs, sequela: Secondary | ICD-10-CM | POA: Diagnosis not present

## 2015-03-09 DIAGNOSIS — Z79899 Other long term (current) drug therapy: Secondary | ICD-10-CM

## 2015-03-09 DIAGNOSIS — D6481 Anemia due to antineoplastic chemotherapy: Secondary | ICD-10-CM

## 2015-03-09 DIAGNOSIS — Z171 Estrogen receptor negative status [ER-]: Secondary | ICD-10-CM

## 2015-03-09 DIAGNOSIS — C50912 Malignant neoplasm of unspecified site of left female breast: Secondary | ICD-10-CM

## 2015-03-09 DIAGNOSIS — E876 Hypokalemia: Secondary | ICD-10-CM

## 2015-03-09 LAB — COMPREHENSIVE METABOLIC PANEL
ALT: 39 U/L (ref 14–54)
AST: 29 U/L (ref 15–41)
Albumin: 3.8 g/dL (ref 3.5–5.0)
Alkaline Phosphatase: 58 U/L (ref 38–126)
Anion gap: 6 (ref 5–15)
BUN: 18 mg/dL (ref 6–20)
CHLORIDE: 101 mmol/L (ref 101–111)
CO2: 26 mmol/L (ref 22–32)
CREATININE: 0.95 mg/dL (ref 0.44–1.00)
Calcium: 8.7 mg/dL — ABNORMAL LOW (ref 8.9–10.3)
Glucose, Bld: 137 mg/dL — ABNORMAL HIGH (ref 65–99)
POTASSIUM: 3.1 mmol/L — AB (ref 3.5–5.1)
SODIUM: 133 mmol/L — AB (ref 135–145)
Total Bilirubin: 0.6 mg/dL (ref 0.3–1.2)
Total Protein: 7.2 g/dL (ref 6.5–8.1)

## 2015-03-09 LAB — CBC WITH DIFFERENTIAL/PLATELET
BASOS ABS: 0 10*3/uL (ref 0–0.1)
Basophils Relative: 1 %
EOS ABS: 0 10*3/uL (ref 0–0.7)
EOS PCT: 1 %
HCT: 23.5 % — ABNORMAL LOW (ref 35.0–47.0)
Hemoglobin: 8.1 g/dL — ABNORMAL LOW (ref 12.0–16.0)
LYMPHS ABS: 0.6 10*3/uL — AB (ref 1.0–3.6)
Lymphocytes Relative: 18 %
MCH: 32.9 pg (ref 26.0–34.0)
MCHC: 34.4 g/dL (ref 32.0–36.0)
MCV: 95.6 fL (ref 80.0–100.0)
MONO ABS: 0.3 10*3/uL (ref 0.2–0.9)
Monocytes Relative: 10 %
Neutro Abs: 2.4 10*3/uL (ref 1.4–6.5)
Neutrophils Relative %: 70 %
PLATELETS: 209 10*3/uL (ref 150–440)
RBC: 2.46 MIL/uL — AB (ref 3.80–5.20)
RDW: 18.3 % — AB (ref 11.5–14.5)
WBC: 3.5 10*3/uL — AB (ref 3.6–11.0)

## 2015-03-09 MED ORDER — SODIUM CHLORIDE 0.9 % IV SOLN
Freq: Once | INTRAVENOUS | Status: AC
  Administered 2015-03-09: 11:00:00 via INTRAVENOUS
  Filled 2015-03-09: qty 4

## 2015-03-09 MED ORDER — DIPHENHYDRAMINE HCL 50 MG/ML IJ SOLN
50.0000 mg | Freq: Once | INTRAMUSCULAR | Status: AC
  Administered 2015-03-09: 50 mg via INTRAVENOUS
  Filled 2015-03-09: qty 1

## 2015-03-09 MED ORDER — SODIUM CHLORIDE 0.9 % IV SOLN
Freq: Once | INTRAVENOUS | Status: AC
  Administered 2015-03-09: 10:00:00 via INTRAVENOUS
  Filled 2015-03-09: qty 1000

## 2015-03-09 MED ORDER — POTASSIUM CHLORIDE CRYS ER 20 MEQ PO TBCR
40.0000 meq | EXTENDED_RELEASE_TABLET | Freq: Once | ORAL | Status: AC
  Administered 2015-03-09: 40 meq via ORAL
  Filled 2015-03-09: qty 2

## 2015-03-09 MED ORDER — POTASSIUM CHLORIDE CRYS ER 20 MEQ PO TBCR: EXTENDED_RELEASE_TABLET | ORAL | Status: DC

## 2015-03-09 MED ORDER — HEPARIN SOD (PORK) LOCK FLUSH 100 UNIT/ML IV SOLN
500.0000 [IU] | Freq: Once | INTRAVENOUS | Status: AC | PRN
  Administered 2015-03-09: 500 [IU]
  Filled 2015-03-09: qty 5

## 2015-03-09 MED ORDER — SODIUM CHLORIDE 0.9 % IJ SOLN
10.0000 mL | INTRAMUSCULAR | Status: DC | PRN
  Administered 2015-03-09: 10 mL
  Filled 2015-03-09: qty 10

## 2015-03-09 MED ORDER — PACLITAXEL CHEMO INJECTION 300 MG/50ML
80.0000 mg/m2 | Freq: Once | INTRAVENOUS | Status: AC
  Administered 2015-03-09: 162 mg via INTRAVENOUS
  Filled 2015-03-09: qty 27

## 2015-03-09 MED ORDER — FAMOTIDINE IN NACL 20-0.9 MG/50ML-% IV SOLN
20.0000 mg | Freq: Once | INTRAVENOUS | Status: AC
  Administered 2015-03-09: 20 mg via INTRAVENOUS
  Filled 2015-03-09: qty 50

## 2015-03-09 NOTE — Progress Notes (Signed)
Patient states she has been having nosebleeds.  She contnues to have neuropathy in her hands and has now started in her feet.

## 2015-03-09 NOTE — Progress Notes (Signed)
Home OFFICE PROGRESS NOTE  Patient Care Team: Guadalupe Maple, MD as PCP - General (Unknown Physician Specialty) Robert Bellow, MD (General Surgery) Guadalupe Maple, MD (Family Medicine)   SUMMARY OF ONCOLOGIC HISTORY:  # MAY 2016- RIGHT BREAST CANCER- Triple negative [T=0.5cm;G-3; margins-Neg]; Mammaprint- 0.814/molecular basal type [5 year risk- 22%; 10 year risk-29%] AC q3 W- taxol q w x 12  # 2012 LEFT BREAST CA STAGE I s/p Lumpec [T1 (0.7CM);Triple Neg; G-3]; s/p mammosite RT; No adj chemo  #  Mild Anemia/M-protein 0.8gm/dl [sep 2016]   INTERVAL HISTORY:  [name] patient with above history of stage I right-sided triple negative breast cancer currently on adjuvant chemotherapy with Adriamycin and Cytoxan status post 4 cycles, currently Taxol weekly 4 out of 12 is here for follow-up.  She has been complaining of numbness of her fingertips since week 1 of Taxol, and now she complains of minimal numbness of her ptosis well. She started Neurontin 3 days ago.  Denies any leg cramps at this time. Denies any rash and palms and soles. Continues to have mild fatigue and mild constipation. Otherwise no nausea no vomiting. Otherwise no chest pain shortness of breath or cough.  REVIEW OF SYSTEMS:  A complete 10 point review of system is done which is negative except mentioned above/history of present illness.   PAST MEDICAL HISTORY :  Past Medical History  Diagnosis Date  . Seasonal allergies   . Unspecified essential hypertension   . Asthma   . 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 Dry eyes 2012  . Breast screening, unspecified   . Lump or mass in breast   . Special screening for malignant neoplasms, colon   . Obesity, unspecified   . Screening  for obesity   . Breast cyst 2010    cyst- drained  . Constipation   . Kidney stone   . GERD (gastroesophageal reflux disease)   . Anemia   . Malignant neoplasm of breast (female), unspecified site 07/08/2014    Right breast, 5 mm, T1a,N0, triple negative; high risk for recurrence on Mammoprint.  . Malignant neoplasm of upper-outer quadrant of female breast ([place]) 05/08/2010    Left breast: T1b, N0, M); triple negative, DCIS present. No chemotherapy. MammoSite radiation.  . Breast cancer ([place])   . Hematuria   . Pancreatic lesion     PAST SURGICAL HISTORY :   Past Surgical History  Procedure Laterality Date  . Cesarean section  1988  . Colonoscopy  2011    Dr. Dionne Milo  . Breast mammosite  April 2012    mammosite placement and removal   . Kidney stone surgery  2003    stones removed by Dr. Quillian Quince- stent placed/removed  . Tubal ligation    . Breast lumpectomy with axillary lymph node biopsy Right 08/01/2014    Procedure: BREAST LUMPECTOMY WITH AXILLARY LYMPH NODE BIOPSY;  Surgeon: Robert Bellow, MD;  Location: [facility] ORS;  Service: General;  Laterality: Right;  . Sentinel node biopsy Right 08/01/2014    Procedure: SENTINEL NODE BIOPSY;  Surgeon: Robert Bellow, MD;  Location: [facility] ORS;  Service: General;  Laterality: Right;  . Breast surgery Left 2012    left breast wide excision, sentinel node, MammoSite  . Breast cyst  aspiration  2010  . Breast surgery Right 08/06/2014    Wide excision/sentinel node biopsy, T1a, N0 triple negative, MammoSite  . Portacath placement Left 11/09/2014    Procedure: INSERTION PORT-A-CATH;  Surgeon: Robert Bellow, MD;  Location: [facility] ORS;  Service: General;  Laterality: Left;    FAMILY HISTORY :   Family History  Problem Relation Age of Onset  . Ovarian cancer Mother   . Diabetes Mother   . Cancer Father     prostate  . Diabetes Father     SOCIAL HISTORY:   Social History  Substance Use Topics  . Smoking status: Never Smoker   .  Smokeless tobacco: Never Used  . Alcohol Use: Yes     Comment: occasionally    ALLERGIES:  is allergic to shellfish allergy and tape.  MEDICATIONS:  Current Outpatient Prescriptions  Medication Sig Dispense Refill  . albuterol (PROVENTIL HFA;VENTOLIN HFA) 108 (90 BASE) MCG/ACT inhaler Inhale 2 puffs into the lungs every 6 (six) hours as needed for wheezing or shortness of breath.    Marland Kitchen aspirin 81 MG tablet Take 81 mg by mouth daily.    . Cholecalciferol (VITAMIN D3) 1000 UNITS CAPS Take 1 capsule by mouth daily.     Marland Kitchen esomeprazole (NEXIUM) 40 MG capsule Take 40 mg by mouth 2 (two) times daily as needed (acid reflux).     . fluconazole (DIFLUCAN) 100 MG tablet Take 1 tablet (100 mg total) by mouth daily. 4 tablet 0  . gabapentin (NEURONTIN) 300 MG capsule Take 1 capsule (300 mg total) by mouth 3 (three) times daily. Start out taking 1 tablet at bedtime daily. Gradually increase dose to three times daily until tolerated. 90 capsule 3  . hydrochlorothiazide (HYDRODIURIL) 12.5 MG tablet Take 1 tablet (12.5 mg total) by mouth daily. 90 tablet 1  . lidocaine-prilocaine (EMLA) cream Apply cream 1 hour before chemotherapy treatment, place saran wrap over the cream to avoid getting on clothes. 30 g 1  . loratadine (CLARITIN) 10 MG tablet Take 10 mg by mouth daily.    . potassium chloride SA (K-DUR,KLOR-CON) 20 MEQ tablet Take 2 tablets (40 mEq total) by mouth 2 (two) times daily. 40 mEq twice daily x 4 days; then 40 mEq daily x 7 days 22 tablet 0  . promethazine (PHENERGAN) 25 MG tablet Take 1 tablet (25 mg total) by mouth every 6 (six) hours as needed for nausea or vomiting. 60 tablet 2   No current facility-administered medications for this visit.   Facility-Administered Medications Ordered in Other Visits  Medication Dose Route Frequency Provider Last Rate Last Dose  . sodium chloride 0.9 % injection 10 mL  10 mL Intravenous PRN Leia Alf, MD   10 mL at 11/10/14 1138    PHYSICAL  EXAMINATION: ECOG PERFORMANCE STATUS: 1 - Symptomatic but completely ambulatory  BP 142/83 mmHg  Pulse 98  Temp(Src) 97.1 F (36.2 C) (Tympanic)  Resp 18  Wt 195 lb 5.2 oz (88.6 kg)  Filed Weights   03/09/15 0938  Weight: 195 lb 5.2 oz (88.6 kg)    GENERAL: Well-nourished well-developed; Alert, no distress and comfortable.   Accompanied by aunt.  EYES: no pallor or icterus OROPHARYNX: no thrush or ulceration; good dentition  NECK: supple, no masses felt LYMPH:  no palpable lymphadenopathy in the cervical, axillary or inguinal regions LUNGS: clear to auscultation and  No wheeze or crackles HEART/CVS: regular rate & rhythm and no murmurs; No lower extremity edema ABDOMEN:abdomen soft, non-tender and normal  bowel sounds Musculoskeletal:no cyanosis of digits and no clubbing  PSYCH: alert & oriented x 3 with fluent speech NEURO: no focal motor/sensory deficits SKIN:  no rashes or significant lesions; Mediport in place no erythema and no discharge no tenderness.  LABORATORY DATA:  I have reviewed the data as listed    Component Value Date/Time   NA 135 03/02/2015 0901   NA 140 07/16/2011 2130   K 3.4* 03/02/2015 0901   K 3.7 07/16/2011 2130   CL 102 03/02/2015 0901   CL 106 07/16/2011 2130   CO2 28 03/02/2015 0901   CO2 27 07/16/2011 2130   GLUCOSE 125* 03/02/2015 0901   GLUCOSE 93 07/16/2011 2130   BUN 18 03/02/2015 0901   BUN 16 07/16/2011 2130   CREATININE 0.82 03/02/2015 0901   CREATININE 0.95 07/16/2011 2130   CALCIUM 9.1 03/02/2015 0901   CALCIUM 9.0 07/16/2011 2130   PROT 7.6 03/02/2015 0901   ALBUMIN 4.0 03/02/2015 0901   AST 38 03/02/2015 0901   ALT 49 03/02/2015 0901   ALKPHOS 60 03/02/2015 0901   BILITOT 0.6 03/02/2015 0901   GFRNONAA >60 03/02/2015 0901   GFRNONAA >60 07/16/2011 2130   GFRAA >60 03/02/2015 0901   GFRAA >60 07/16/2011 2130    No results found for: SPEP, UPEP  Lab Results  Component Value Date   WBC 3.5* 03/09/2015   NEUTROABS 2.4  03/09/2015   HGB 8.1* 03/09/2015   HCT 23.5* 03/09/2015   MCV 95.6 03/09/2015   PLT 209 03/09/2015      Chemistry      Component Value Date/Time   NA 135 03/02/2015 0901   NA 140 07/16/2011 2130   K 3.4* 03/02/2015 0901   K 3.7 07/16/2011 2130   CL 102 03/02/2015 0901   CL 106 07/16/2011 2130   CO2 28 03/02/2015 0901   CO2 27 07/16/2011 2130   BUN 18 03/02/2015 0901   BUN 16 07/16/2011 2130   CREATININE 0.82 03/02/2015 0901   CREATININE 0.95 07/16/2011 2130      Component Value Date/Time   CALCIUM 9.1 03/02/2015 0901   CALCIUM 9.0 07/16/2011 2130   ALKPHOS 60 03/02/2015 0901   AST 38 03/02/2015 0901   ALT 49 03/02/2015 0901   BILITOT 0.6 03/02/2015 0901       RADIOGRAPHIC STUDIES: I have personally reviewed the radiological images as listed and agreed with the findings in the report. No results found.   ASSESSMENT & PLAN:   # RIGHT BREAST CANCER STAGE I - triple negative; on adjuvant chemotherapy Adriamycin and Cytoxan status post cycle #4, now Taxol weekly 4 out of 12. Neuropathy continues to be present with minimal progression. She will continue to increase the dose of Neurontin as discussed in prescription, which hopefully will allow her to complete the treatment   # Persistent hypokalemia- we will advise her to take 40 mEq of potassium chloride twice a day continuously, since her potassium is 3.3, while she is taking and 40 mEq of potassium daily.  # prior history of severe anemia from chemotherapy status post transfusion approximately 2 weeks ago. Today hemoglobin is 8.1. So far no need for transfusion.  # patient will get weekly labs; follow-up with MD in 6 weeks.  No orders of the defined types were placed in this encounter.   All questions were answered. The patient knows to call the clinic with any problems, questions or concerns. No barriers to learning was detected.  I spent 25 minutes counseling the  patient face to face. The total time spent in the  appointment was 30 minutes and more than 50% was on counseling and review of test results     Roxana Hires, MD 03/09/2015 9:49 AM

## 2015-03-16 ENCOUNTER — Inpatient Hospital Stay: Payer: 59

## 2015-03-16 ENCOUNTER — Inpatient Hospital Stay: Payer: 59 | Attending: Family Medicine

## 2015-03-16 DIAGNOSIS — Z5111 Encounter for antineoplastic chemotherapy: Secondary | ICD-10-CM | POA: Insufficient documentation

## 2015-03-16 DIAGNOSIS — Z171 Estrogen receptor negative status [ER-]: Secondary | ICD-10-CM | POA: Diagnosis not present

## 2015-03-16 DIAGNOSIS — K219 Gastro-esophageal reflux disease without esophagitis: Secondary | ICD-10-CM | POA: Diagnosis not present

## 2015-03-16 DIAGNOSIS — Z853 Personal history of malignant neoplasm of breast: Secondary | ICD-10-CM | POA: Diagnosis not present

## 2015-03-16 DIAGNOSIS — J45909 Unspecified asthma, uncomplicated: Secondary | ICD-10-CM | POA: Diagnosis not present

## 2015-03-16 DIAGNOSIS — Z7982 Long term (current) use of aspirin: Secondary | ICD-10-CM | POA: Insufficient documentation

## 2015-03-16 DIAGNOSIS — I10 Essential (primary) hypertension: Secondary | ICD-10-CM | POA: Diagnosis not present

## 2015-03-16 DIAGNOSIS — C50911 Malignant neoplasm of unspecified site of right female breast: Secondary | ICD-10-CM

## 2015-03-16 DIAGNOSIS — E876 Hypokalemia: Secondary | ICD-10-CM

## 2015-03-16 DIAGNOSIS — Z79899 Other long term (current) drug therapy: Secondary | ICD-10-CM | POA: Insufficient documentation

## 2015-03-16 DIAGNOSIS — G629 Polyneuropathy, unspecified: Secondary | ICD-10-CM | POA: Insufficient documentation

## 2015-03-16 DIAGNOSIS — C50912 Malignant neoplasm of unspecified site of left female breast: Secondary | ICD-10-CM

## 2015-03-16 DIAGNOSIS — D649 Anemia, unspecified: Secondary | ICD-10-CM | POA: Diagnosis not present

## 2015-03-16 LAB — COMPREHENSIVE METABOLIC PANEL
ALBUMIN: 3.8 g/dL (ref 3.5–5.0)
ALT: 26 U/L (ref 14–54)
ANION GAP: 7 (ref 5–15)
AST: 24 U/L (ref 15–41)
Alkaline Phosphatase: 57 U/L (ref 38–126)
BUN: 21 mg/dL — ABNORMAL HIGH (ref 6–20)
CO2: 27 mmol/L (ref 22–32)
Calcium: 9.1 mg/dL (ref 8.9–10.3)
Chloride: 100 mmol/L — ABNORMAL LOW (ref 101–111)
Creatinine, Ser: 1.23 mg/dL — ABNORMAL HIGH (ref 0.44–1.00)
GFR calc Af Amer: 56 mL/min — ABNORMAL LOW (ref >60)
GFR calc non Af Amer: 48 mL/min — ABNORMAL LOW (ref >60)
GLUCOSE: 141 mg/dL — AB (ref 65–99)
POTASSIUM: 3.5 mmol/L (ref 3.5–5.1)
SODIUM: 134 mmol/L — AB (ref 135–145)
Total Bilirubin: 0.6 mg/dL (ref 0.3–1.2)
Total Protein: 7.6 g/dL (ref 6.5–8.1)

## 2015-03-16 LAB — CBC WITH DIFFERENTIAL/PLATELET
BASOS PCT: 1 %
Basophils Absolute: 0 10*3/uL (ref 0–0.1)
EOS ABS: 0.1 10*3/uL (ref 0–0.7)
EOS PCT: 2 %
HCT: 23.4 % — ABNORMAL LOW (ref 35.0–47.0)
Hemoglobin: 8 g/dL — ABNORMAL LOW (ref 12.0–16.0)
Lymphocytes Relative: 17 %
Lymphs Abs: 0.5 10*3/uL — ABNORMAL LOW (ref 1.0–3.6)
MCH: 33.4 pg (ref 26.0–34.0)
MCHC: 34.2 g/dL (ref 32.0–36.0)
MCV: 97.7 fL (ref 80.0–100.0)
MONO ABS: 0.3 10*3/uL (ref 0.2–0.9)
MONOS PCT: 12 %
NEUTROS PCT: 68 %
Neutro Abs: 2 10*3/uL (ref 1.4–6.5)
PLATELETS: 221 10*3/uL (ref 150–440)
RBC: 2.39 MIL/uL — ABNORMAL LOW (ref 3.80–5.20)
RDW: 18.6 % — AB (ref 11.5–14.5)
WBC: 2.9 10*3/uL — ABNORMAL LOW (ref 3.6–11.0)

## 2015-03-16 MED ORDER — DIPHENHYDRAMINE HCL 50 MG/ML IJ SOLN
50.0000 mg | Freq: Once | INTRAMUSCULAR | Status: AC
  Administered 2015-03-16: 50 mg via INTRAVENOUS
  Filled 2015-03-16: qty 1

## 2015-03-16 MED ORDER — PACLITAXEL CHEMO INJECTION 300 MG/50ML
80.0000 mg/m2 | Freq: Once | INTRAVENOUS | Status: AC
  Administered 2015-03-16: 162 mg via INTRAVENOUS
  Filled 2015-03-16: qty 27

## 2015-03-16 MED ORDER — FAMOTIDINE IN NACL 20-0.9 MG/50ML-% IV SOLN
20.0000 mg | Freq: Once | INTRAVENOUS | Status: AC
  Administered 2015-03-16: 20 mg via INTRAVENOUS
  Filled 2015-03-16: qty 50

## 2015-03-16 MED ORDER — SODIUM CHLORIDE 0.9 % IV SOLN
Freq: Once | INTRAVENOUS | Status: AC
  Administered 2015-03-16: 10:00:00 via INTRAVENOUS
  Filled 2015-03-16: qty 4

## 2015-03-16 MED ORDER — POTASSIUM CHLORIDE CRYS ER 20 MEQ PO TBCR: 20.0000 meq | EXTENDED_RELEASE_TABLET | Freq: Two times a day (BID) | ORAL | Status: DC

## 2015-03-16 MED ORDER — SODIUM CHLORIDE 0.9 % IJ SOLN
10.0000 mL | INTRAMUSCULAR | Status: DC | PRN
  Filled 2015-03-16: qty 10

## 2015-03-16 MED ORDER — HEPARIN SOD (PORK) LOCK FLUSH 100 UNIT/ML IV SOLN
500.0000 [IU] | Freq: Once | INTRAVENOUS | Status: AC
  Administered 2015-03-16: 500 [IU] via INTRAVENOUS
  Filled 2015-03-16: qty 5

## 2015-03-16 MED ORDER — SODIUM CHLORIDE 0.9 % IV SOLN
Freq: Once | INTRAVENOUS | Status: AC
  Administered 2015-03-16: 10:00:00 via INTRAVENOUS
  Filled 2015-03-16: qty 1000

## 2015-03-19 ENCOUNTER — Telehealth: Payer: Self-pay | Admitting: *Deleted

## 2015-03-19 ENCOUNTER — Ambulatory Visit: Payer: 59 | Admitting: Radiation Oncology

## 2015-03-19 NOTE — Telephone Encounter (Signed)
Notified patient of appointment change due to weather.  New appt time  Monday 04-16-15 at 10am.  Patient stated that would be ok.

## 2015-03-23 ENCOUNTER — Inpatient Hospital Stay: Payer: 59

## 2015-03-23 VITALS — BP 120/76 | HR 88 | Resp 18

## 2015-03-23 DIAGNOSIS — C50912 Malignant neoplasm of unspecified site of left female breast: Secondary | ICD-10-CM

## 2015-03-23 DIAGNOSIS — C50911 Malignant neoplasm of unspecified site of right female breast: Secondary | ICD-10-CM | POA: Diagnosis not present

## 2015-03-23 LAB — COMPREHENSIVE METABOLIC PANEL
ALBUMIN: 3.8 g/dL (ref 3.5–5.0)
ALK PHOS: 63 U/L (ref 38–126)
ALT: 27 U/L (ref 14–54)
ANION GAP: 7 (ref 5–15)
AST: 23 U/L (ref 15–41)
BUN: 20 mg/dL (ref 6–20)
CALCIUM: 9.2 mg/dL (ref 8.9–10.3)
CO2: 25 mmol/L (ref 22–32)
Chloride: 101 mmol/L (ref 101–111)
Creatinine, Ser: 0.87 mg/dL (ref 0.44–1.00)
GFR calc Af Amer: >60 mL/min (ref >60)
GFR calc non Af Amer: >60 mL/min (ref >60)
GLUCOSE: 146 mg/dL — AB (ref 65–99)
Potassium: 3.2 mmol/L — ABNORMAL LOW (ref 3.5–5.1)
SODIUM: 133 mmol/L — AB (ref 135–145)
Total Bilirubin: 0.7 mg/dL (ref 0.3–1.2)
Total Protein: 7.3 g/dL (ref 6.5–8.1)

## 2015-03-23 LAB — CBC WITH DIFFERENTIAL/PLATELET
BASOS ABS: 0 10*3/uL (ref 0–0.1)
BASOS PCT: 1 %
EOS ABS: 0 10*3/uL (ref 0–0.7)
Eosinophils Relative: 1 %
HEMATOCRIT: 24.8 % — AB (ref 35.0–47.0)
HEMOGLOBIN: 8.5 g/dL — AB (ref 12.0–16.0)
Lymphocytes Relative: 27 %
Lymphs Abs: 0.8 10*3/uL — ABNORMAL LOW (ref 1.0–3.6)
MCH: 33.7 pg (ref 26.0–34.0)
MCHC: 34.3 g/dL (ref 32.0–36.0)
MCV: 98.1 fL (ref 80.0–100.0)
MONOS PCT: 8 %
Monocytes Absolute: 0.2 10*3/uL (ref 0.2–0.9)
NEUTROS ABS: 1.8 10*3/uL (ref 1.4–6.5)
NEUTROS PCT: 63 %
Platelets: 266 10*3/uL (ref 150–440)
RBC: 2.53 MIL/uL — AB (ref 3.80–5.20)
RDW: 18.6 % — ABNORMAL HIGH (ref 11.5–14.5)
WBC: 2.8 10*3/uL — AB (ref 3.6–11.0)

## 2015-03-23 MED ORDER — SODIUM CHLORIDE 0.9 % IV SOLN
Freq: Once | INTRAVENOUS | Status: AC
  Administered 2015-03-23: 11:00:00 via INTRAVENOUS
  Filled 2015-03-23: qty 4

## 2015-03-23 MED ORDER — HEPARIN SOD (PORK) LOCK FLUSH 100 UNIT/ML IV SOLN
500.0000 [IU] | Freq: Once | INTRAVENOUS | Status: AC | PRN
  Administered 2015-03-23: 500 [IU]
  Filled 2015-03-23: qty 5

## 2015-03-23 MED ORDER — DIPHENHYDRAMINE HCL 50 MG/ML IJ SOLN
50.0000 mg | Freq: Once | INTRAMUSCULAR | Status: AC
  Administered 2015-03-23: 50 mg via INTRAVENOUS
  Filled 2015-03-23: qty 1

## 2015-03-23 MED ORDER — SODIUM CHLORIDE 0.9 % IV SOLN
Freq: Once | INTRAVENOUS | Status: AC
  Administered 2015-03-23: 11:00:00 via INTRAVENOUS
  Filled 2015-03-23: qty 1000

## 2015-03-23 MED ORDER — FAMOTIDINE IN NACL 20-0.9 MG/50ML-% IV SOLN
20.0000 mg | Freq: Once | INTRAVENOUS | Status: AC
  Administered 2015-03-23: 20 mg via INTRAVENOUS
  Filled 2015-03-23: qty 50

## 2015-03-23 MED ORDER — PACLITAXEL CHEMO INJECTION 300 MG/50ML
80.0000 mg/m2 | Freq: Once | INTRAVENOUS | Status: AC
  Administered 2015-03-23: 162 mg via INTRAVENOUS
  Filled 2015-03-23: qty 27

## 2015-03-30 ENCOUNTER — Inpatient Hospital Stay: Payer: 59

## 2015-03-30 ENCOUNTER — Inpatient Hospital Stay (HOSPITAL_BASED_OUTPATIENT_CLINIC_OR_DEPARTMENT_OTHER): Payer: 59 | Admitting: Internal Medicine

## 2015-03-30 DIAGNOSIS — C50911 Malignant neoplasm of unspecified site of right female breast: Secondary | ICD-10-CM

## 2015-03-30 DIAGNOSIS — E876 Hypokalemia: Secondary | ICD-10-CM

## 2015-03-30 DIAGNOSIS — Z79899 Other long term (current) drug therapy: Secondary | ICD-10-CM

## 2015-03-30 DIAGNOSIS — G629 Polyneuropathy, unspecified: Secondary | ICD-10-CM

## 2015-03-30 DIAGNOSIS — Z171 Estrogen receptor negative status [ER-]: Secondary | ICD-10-CM | POA: Diagnosis not present

## 2015-03-30 DIAGNOSIS — C50912 Malignant neoplasm of unspecified site of left female breast: Secondary | ICD-10-CM

## 2015-03-30 DIAGNOSIS — D649 Anemia, unspecified: Secondary | ICD-10-CM | POA: Diagnosis not present

## 2015-03-30 DIAGNOSIS — T451X5A Adverse effect of antineoplastic and immunosuppressive drugs, initial encounter: Secondary | ICD-10-CM

## 2015-03-30 DIAGNOSIS — Z853 Personal history of malignant neoplasm of breast: Secondary | ICD-10-CM

## 2015-03-30 DIAGNOSIS — G620 Drug-induced polyneuropathy: Secondary | ICD-10-CM

## 2015-03-30 LAB — CBC WITH DIFFERENTIAL/PLATELET
BASOS ABS: 0 10*3/uL (ref 0–0.1)
BASOS PCT: 1 %
EOS PCT: 2 %
Eosinophils Absolute: 0 10*3/uL (ref 0–0.7)
HCT: 21.9 % — ABNORMAL LOW (ref 35.0–47.0)
Hemoglobin: 7.5 g/dL — ABNORMAL LOW (ref 12.0–16.0)
Lymphocytes Relative: 17 %
Lymphs Abs: 0.5 10*3/uL — ABNORMAL LOW (ref 1.0–3.6)
MCH: 33.9 pg (ref 26.0–34.0)
MCHC: 34.2 g/dL (ref 32.0–36.0)
MCV: 99.2 fL (ref 80.0–100.0)
MONO ABS: 0.4 10*3/uL (ref 0.2–0.9)
Monocytes Relative: 12 %
NEUTROS ABS: 2.1 10*3/uL (ref 1.4–6.5)
Neutrophils Relative %: 68 %
PLATELETS: 246 10*3/uL (ref 150–440)
RBC: 2.21 MIL/uL — ABNORMAL LOW (ref 3.80–5.20)
RDW: 19.9 % — AB (ref 11.5–14.5)
WBC: 3 10*3/uL — ABNORMAL LOW (ref 3.6–11.0)

## 2015-03-30 LAB — COMPREHENSIVE METABOLIC PANEL
ALBUMIN: 4 g/dL (ref 3.5–5.0)
ALT: 26 U/L (ref 14–54)
AST: 23 U/L (ref 15–41)
Alkaline Phosphatase: 59 U/L (ref 38–126)
Anion gap: 6 (ref 5–15)
BUN: 23 mg/dL — AB (ref 6–20)
CHLORIDE: 101 mmol/L (ref 101–111)
CO2: 26 mmol/L (ref 22–32)
Calcium: 9.2 mg/dL (ref 8.9–10.3)
Creatinine, Ser: 1.04 mg/dL — ABNORMAL HIGH (ref 0.44–1.00)
GFR calc Af Amer: >60 mL/min (ref >60)
GFR calc non Af Amer: 58 mL/min — ABNORMAL LOW (ref >60)
GLUCOSE: 131 mg/dL — AB (ref 65–99)
POTASSIUM: 3.5 mmol/L (ref 3.5–5.1)
Sodium: 133 mmol/L — ABNORMAL LOW (ref 135–145)
Total Bilirubin: 0.6 mg/dL (ref 0.3–1.2)
Total Protein: 7.4 g/dL (ref 6.5–8.1)

## 2015-03-30 LAB — PREPARE RBC (CROSSMATCH)

## 2015-03-30 MED ORDER — SODIUM CHLORIDE 0.9 % IV SOLN
Freq: Once | INTRAVENOUS | Status: AC
  Administered 2015-03-30: 11:00:00 via INTRAVENOUS
  Filled 2015-03-30: qty 4

## 2015-03-30 MED ORDER — SODIUM CHLORIDE 0.9 % IJ SOLN
10.0000 mL | INTRAMUSCULAR | Status: DC | PRN
  Administered 2015-03-30: 10 mL via INTRAVENOUS
  Filled 2015-03-30: qty 10

## 2015-03-30 MED ORDER — PACLITAXEL CHEMO INJECTION 300 MG/50ML
80.0000 mg/m2 | Freq: Once | INTRAVENOUS | Status: AC
  Administered 2015-03-30: 162 mg via INTRAVENOUS
  Filled 2015-03-30: qty 27

## 2015-03-30 MED ORDER — FAMOTIDINE IN NACL 20-0.9 MG/50ML-% IV SOLN
20.0000 mg | Freq: Once | INTRAVENOUS | Status: AC
  Administered 2015-03-30: 20 mg via INTRAVENOUS
  Filled 2015-03-30: qty 50

## 2015-03-30 MED ORDER — SODIUM CHLORIDE 0.9 % IV SOLN
Freq: Once | INTRAVENOUS | Status: AC
  Administered 2015-03-30: 11:00:00 via INTRAVENOUS
  Filled 2015-03-30: qty 1000

## 2015-03-30 MED ORDER — HEPARIN SOD (PORK) LOCK FLUSH 100 UNIT/ML IV SOLN
500.0000 [IU] | Freq: Once | INTRAVENOUS | Status: AC
  Administered 2015-03-30: 500 [IU] via INTRAVENOUS
  Filled 2015-03-30: qty 5

## 2015-03-30 MED ORDER — GABAPENTIN 300 MG PO CAPS: 600.0000 mg | ORAL_CAPSULE | Freq: Three times a day (TID) | ORAL | Status: DC

## 2015-03-30 MED ORDER — DIPHENHYDRAMINE HCL 50 MG/ML IJ SOLN
50.0000 mg | Freq: Once | INTRAMUSCULAR | Status: AC
  Administered 2015-03-30: 50 mg via INTRAVENOUS
  Filled 2015-03-30: qty 1

## 2015-03-30 NOTE — Progress Notes (Signed)
Patient states she continues to have problems with neuropathy in her hands and feet.  States she feels like she is walking on cushions.  Also states she has problems with buttons and zippers but still able to work them.  She cannot flip the top on her lotion bottle.  Patient states she would like to have a dietary referral.

## 2015-03-30 NOTE — Patient Instructions (Signed)

## 2015-03-30 NOTE — Progress Notes (Signed)
[facility] OFFICE PROGRESS NOTE  Patient Care Team: Guadalupe Maple, MD as PCP - General (Unknown Physician Specialty) Robert Bellow, MD (General Surgery) Guadalupe Maple, MD (Family Medicine)   SUMMARY OF ONCOLOGIC HISTORY:  # MAY 2016- RIGHT BREAST CANCER- Triple negative [T=0.5cm;G-3; margins-Neg]; Mammaprint- 0.814/molecular basal type [5 year risk- 22%; 10 year risk-29%] AC q3 W- taxol q w x 12  # 2012 LEFT BREAST CA STAGE I s/p Lumpec [T1 (0.7CM);Triple Neg; G-3]; s/p mammosite RT; No adj chemo  #  Mild Anemia/M-protein 0.8gm/dl [sep 2016]   INTERVAL HISTORY:  A pleasant 55 year old female patient with above history of stage I right-sided triple negative breast cancer currently on adjuvant chemotherapy status post 6 treatments of weekly Taxol.  Patient complains of tingling and numbness of her feet and also hands. However no falls; she is able to carry on with her activities of daily living. She is currently taking Neurontin 300 mg 3 times a day; without any major improvement in the symptoms.  Continues to have mild-moderate fatigue. Otherwise no nausea no vomiting. Otherwise no chest pain shortness of breath or cough.  REVIEW OF SYSTEMS:  A complete 10 point review of system is done which is negative except mentioned above/history of present illness.   PAST MEDICAL HISTORY :  Past Medical History  Diagnosis Date  . Seasonal allergies   . Unspecified essential hypertension   . Asthma   . 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 Dry eyes 2012  . Breast screening, unspecified   . Lump or mass in breast   . Special screening for malignant neoplasms, colon   . Obesity, unspecified   . Screening for obesity   . Breast cyst 2010    cyst- drained  . Constipation   .  Kidney stone   . GERD (gastroesophageal reflux disease)   . Anemia   . Malignant neoplasm of breast (female), unspecified site 07/08/2014    Right breast, 5 mm, T1a,N0, triple negative; high risk for recurrence on Mammoprint.  . Malignant neoplasm of upper-outer quadrant of female breast ([place]) 05/08/2010    Left breast: T1b, N0, M); triple negative, DCIS present. No chemotherapy. MammoSite radiation.  . Breast cancer ([place])   . Hematuria   . Pancreatic lesion     PAST SURGICAL HISTORY :   Past Surgical History  Procedure Laterality Date  . Cesarean section  1988  . Colonoscopy  2011    Dr. Dionne Milo  . Breast mammosite  April 2012    mammosite placement and removal   . Kidney stone surgery  2003    stones removed by Dr. Quillian Quince- stent placed/removed  . Tubal ligation    . Breast lumpectomy with axillary lymph node biopsy Right 08/01/2014    Procedure: BREAST LUMPECTOMY WITH AXILLARY LYMPH NODE BIOPSY;  Surgeon: Robert Bellow, MD;  Location: [facility] ORS;  Service: General;  Laterality: Right;  . Sentinel node biopsy Right 08/01/2014    Procedure: SENTINEL NODE BIOPSY;  Surgeon: Robert Bellow, MD;  Location: [facility] ORS;  Service: General;  Laterality: Right;  . Breast surgery Left 2012    left breast wide excision, sentinel node, MammoSite  . Breast cyst aspiration  2010  . Breast surgery Right 08/06/2014    Wide excision/sentinel node  biopsy, T1a, N0 triple negative, MammoSite  . Portacath placement Left 11/09/2014    Procedure: INSERTION PORT-A-CATH;  Surgeon: Robert Bellow, MD;  Location: [facility] ORS;  Service: General;  Laterality: Left;    FAMILY HISTORY :   Family History  Problem Relation Age of Onset  . Ovarian cancer Mother   . Diabetes Mother   . Cancer Father     prostate  . Diabetes Father     SOCIAL HISTORY:   Social History  Substance Use Topics  . Smoking status: Never Smoker   . Smokeless tobacco: Never Used  . Alcohol Use: Yes     Comment: occasionally     ALLERGIES:  is allergic to shellfish allergy and tape.  MEDICATIONS:  Current Outpatient Prescriptions  Medication Sig Dispense Refill  . albuterol (PROVENTIL HFA;VENTOLIN HFA) 108 (90 BASE) MCG/ACT inhaler Inhale 2 puffs into the lungs every 6 (six) hours as needed for wheezing or shortness of breath.    Marland Kitchen aspirin 81 MG tablet Take 81 mg by mouth daily.    . Cholecalciferol (VITAMIN D3) 1000 UNITS CAPS Take 1 capsule by mouth daily.     Marland Kitchen esomeprazole (NEXIUM) 40 MG capsule Take 40 mg by mouth 2 (two) times daily as needed (acid reflux).     . fluconazole (DIFLUCAN) 100 MG tablet Take 1 tablet (100 mg total) by mouth daily. 4 tablet 0  . gabapentin (NEURONTIN) 300 MG capsule Take 1 capsule (300 mg total) by mouth 3 (three) times daily. Start out taking 1 tablet at bedtime daily. Gradually increase dose to three times daily until tolerated. 90 capsule 3  . hydrochlorothiazide (HYDRODIURIL) 12.5 MG tablet Take 1 tablet (12.5 mg total) by mouth daily. 90 tablet 1  . lidocaine-prilocaine (EMLA) cream Apply cream 1 hour before chemotherapy treatment, place saran wrap over the cream to avoid getting on clothes. 30 g 1  . loratadine (CLARITIN) 10 MG tablet Take 10 mg by mouth daily.    . potassium chloride SA (K-DUR,KLOR-CON) 20 MEQ tablet Take 1 tablet (20 mEq total) by mouth 2 (two) times daily. 60 tablet 0  . promethazine (PHENERGAN) 25 MG tablet Take 1 tablet (25 mg total) by mouth every 6 (six) hours as needed for nausea or vomiting. 60 tablet 2   No current facility-administered medications for this visit.   Facility-Administered Medications Ordered in Other Visits  Medication Dose Route Frequency Provider Last Rate Last Dose  . heparin lock flush 100 unit/mL  500 Units Intravenous Once Cammie Sickle, MD      . sodium chloride 0.9 % injection 10 mL  10 mL Intravenous PRN Leia Alf, MD   10 mL at 11/10/14 1138  . sodium chloride 0.9 % injection 10 mL  10 mL Intravenous PRN  Cammie Sickle, MD        PHYSICAL EXAMINATION: ECOG PERFORMANCE STATUS: 1 - Symptomatic but completely ambulatory  BP 131/85 mmHg  Pulse 96  Temp(Src) 98.4 F (36.9 C) (Tympanic)  Resp 18  Wt 189 lb 13.1 oz (86.1 kg)  Filed Weights   03/30/15 0918  Weight: 189 lb 13.1 oz (86.1 kg)    GENERAL: Well-nourished well-developed; Alert, no distress and comfortable.   Accompanied by aunt.  EYES: no pallor or icterus OROPHARYNX: no thrush or ulceration; good dentition  NECK: supple, no masses felt LYMPH:  no palpable lymphadenopathy in the cervical, axillary or inguinal regions LUNGS: clear to auscultation and  No wheeze or crackles HEART/CVS: regular rate &  rhythm and no murmurs; No lower extremity edema ABDOMEN:abdomen soft, non-tender and normal bowel sounds Musculoskeletal:no cyanosis of digits and no clubbing  PSYCH: alert & oriented x 3 with fluent speech NEURO: no focal motor/sensory deficits SKIN:  no rashes or significant lesions; Mediport in place no erythema and no discharge no tenderness.  LABORATORY DATA:  I have reviewed the data as listed    Component Value Date/Time   NA 133* 03/30/2015 0859   NA 140 07/16/2011 2130   K 3.5 03/30/2015 0859   K 3.7 07/16/2011 2130   CL 101 03/30/2015 0859   CL 106 07/16/2011 2130   CO2 26 03/30/2015 0859   CO2 27 07/16/2011 2130   GLUCOSE 131* 03/30/2015 0859   GLUCOSE 93 07/16/2011 2130   BUN 23* 03/30/2015 0859   BUN 16 07/16/2011 2130   CREATININE 1.04* 03/30/2015 0859   CREATININE 0.95 07/16/2011 2130   CALCIUM 9.2 03/30/2015 0859   CALCIUM 9.0 07/16/2011 2130   PROT 7.4 03/30/2015 0859   ALBUMIN 4.0 03/30/2015 0859   AST 23 03/30/2015 0859   ALT 26 03/30/2015 0859   ALKPHOS 59 03/30/2015 0859   BILITOT 0.6 03/30/2015 0859   GFRNONAA 58* 03/30/2015 0859   GFRNONAA >60 07/16/2011 2130   GFRAA >60 03/30/2015 0859   GFRAA >60 07/16/2011 2130    No results found for: SPEP, UPEP  Lab Results  Component  Value Date   WBC 3.0* 03/30/2015   NEUTROABS 2.1 03/30/2015   HGB 7.5* 03/30/2015   HCT 21.9* 03/30/2015   MCV 99.2 03/30/2015   PLT 246 03/30/2015      Chemistry      Component Value Date/Time   NA 133* 03/30/2015 0859   NA 140 07/16/2011 2130   K 3.5 03/30/2015 0859   K 3.7 07/16/2011 2130   CL 101 03/30/2015 0859   CL 106 07/16/2011 2130   CO2 26 03/30/2015 0859   CO2 27 07/16/2011 2130   BUN 23* 03/30/2015 0859   BUN 16 07/16/2011 2130   CREATININE 1.04* 03/30/2015 0859   CREATININE 0.95 07/16/2011 2130      Component Value Date/Time   CALCIUM 9.2 03/30/2015 0859   CALCIUM 9.0 07/16/2011 2130   ALKPHOS 59 03/30/2015 0859   AST 23 03/30/2015 0859   ALT 26 03/30/2015 0859   BILITOT 0.6 03/30/2015 0859       RADIOGRAPHIC STUDIES: I have personally reviewed the radiological images as listed and agreed with the findings in the report. No results found.   ASSESSMENT & PLAN:   # RIGHT BREAST CANCER STAGE I - triple negative   Currently on adjuvant Taxol weekly times 6. Patient tolerating chemotherapy fairly well except for-continued anemia/and neuropathy [see discussion below].  # Proceed with Taxol No. 7 today. CMP within normal limits; hemoglobin 7.5.  A long discussion the patient regarding the treatment options especially in the context of her neuropathy. If he continues to get worse- I would recommend stopping Taxol; versus spacing of Taxol to every 2 weeks. Patient agrees with the plan.  # Peripheral neuropathy grade 2- recommend increasing the dose of Neurontin to 600 milligrams 3 times a day.  # Anemia 7.5 hemoglobin recommend 2 units of blood transfusion next week.   # Severe hypokalemia status post improvement. Today potassium is 3.5.   # She will get weekly labs/follow-up with me in 2 weeks prior to cycle #9   # 25 minutes face-to-face with the patient discussing the above plan of care;  more than 50% of time spent on prognosis/ natural history;  counseling and coordination.     Cammie Sickle, MD 03/30/2015 9:43 AM

## 2015-04-02 ENCOUNTER — Inpatient Hospital Stay: Payer: 59

## 2015-04-02 VITALS — BP 108/70 | HR 80 | Temp 98.1°F | Resp 18

## 2015-04-02 DIAGNOSIS — C50911 Malignant neoplasm of unspecified site of right female breast: Secondary | ICD-10-CM | POA: Diagnosis not present

## 2015-04-02 DIAGNOSIS — C50912 Malignant neoplasm of unspecified site of left female breast: Secondary | ICD-10-CM

## 2015-04-02 MED ORDER — DIPHENHYDRAMINE HCL 25 MG PO CAPS
25.0000 mg | ORAL_CAPSULE | Freq: Once | ORAL | Status: AC
  Administered 2015-04-02: 25 mg via ORAL
  Filled 2015-04-02: qty 1

## 2015-04-02 MED ORDER — SODIUM CHLORIDE 0.9 % IJ SOLN
10.0000 mL | INTRAMUSCULAR | Status: AC | PRN
  Administered 2015-04-02: 10 mL
  Filled 2015-04-02: qty 10

## 2015-04-02 MED ORDER — ACETAMINOPHEN 325 MG PO TABS
650.0000 mg | ORAL_TABLET | Freq: Once | ORAL | Status: AC
  Administered 2015-04-02: 650 mg via ORAL
  Filled 2015-04-02: qty 2

## 2015-04-02 MED ORDER — SODIUM CHLORIDE 0.9 % IV SOLN
250.0000 mL | Freq: Once | INTRAVENOUS | Status: AC
  Administered 2015-04-02: 250 mL via INTRAVENOUS
  Filled 2015-04-02: qty 250

## 2015-04-02 MED ORDER — HEPARIN SOD (PORK) LOCK FLUSH 100 UNIT/ML IV SOLN
500.0000 [IU] | Freq: Every day | INTRAVENOUS | Status: AC | PRN
  Administered 2015-04-02: 500 [IU]
  Filled 2015-04-02: qty 5

## 2015-04-03 LAB — TYPE AND SCREEN
ABO/RH(D): O POS
ANTIBODY SCREEN: NEGATIVE
Unit division: 0
Unit division: 0

## 2015-04-06 ENCOUNTER — Inpatient Hospital Stay: Payer: 59

## 2015-04-06 ENCOUNTER — Other Ambulatory Visit: Payer: Self-pay | Admitting: Internal Medicine

## 2015-04-06 VITALS — BP 119/78 | HR 97 | Temp 98.8°F | Resp 18

## 2015-04-06 DIAGNOSIS — E876 Hypokalemia: Secondary | ICD-10-CM

## 2015-04-06 DIAGNOSIS — C50911 Malignant neoplasm of unspecified site of right female breast: Secondary | ICD-10-CM

## 2015-04-06 DIAGNOSIS — C50912 Malignant neoplasm of unspecified site of left female breast: Secondary | ICD-10-CM

## 2015-04-06 LAB — CBC WITH DIFFERENTIAL/PLATELET
Basophils Absolute: 0 10*3/uL (ref 0–0.1)
Basophils Relative: 1 %
Eosinophils Absolute: 0 10*3/uL (ref 0–0.7)
Eosinophils Relative: 1 %
HEMATOCRIT: 29.4 % — AB (ref 35.0–47.0)
HEMOGLOBIN: 10.3 g/dL — AB (ref 12.0–16.0)
LYMPHS PCT: 18 %
Lymphs Abs: 0.5 10*3/uL — ABNORMAL LOW (ref 1.0–3.6)
MCH: 32.2 pg (ref 26.0–34.0)
MCHC: 35.1 g/dL (ref 32.0–36.0)
MCV: 91.7 fL (ref 80.0–100.0)
MONO ABS: 0.3 10*3/uL (ref 0.2–0.9)
MONOS PCT: 12 %
NEUTROS ABS: 2 10*3/uL (ref 1.4–6.5)
NEUTROS PCT: 70 %
Platelets: 198 10*3/uL (ref 150–440)
RBC: 3.21 MIL/uL — ABNORMAL LOW (ref 3.80–5.20)
RDW: 20.5 % — AB (ref 11.5–14.5)
WBC: 2.8 10*3/uL — ABNORMAL LOW (ref 3.6–11.0)

## 2015-04-06 LAB — COMPREHENSIVE METABOLIC PANEL
ALK PHOS: 59 U/L (ref 38–126)
ALT: 26 U/L (ref 14–54)
AST: 20 U/L (ref 15–41)
Albumin: 4 g/dL (ref 3.5–5.0)
Anion gap: 8 (ref 5–15)
BILIRUBIN TOTAL: 0.9 mg/dL (ref 0.3–1.2)
BUN: 24 mg/dL — AB (ref 6–20)
CALCIUM: 9.2 mg/dL (ref 8.9–10.3)
CO2: 25 mmol/L (ref 22–32)
Chloride: 98 mmol/L — ABNORMAL LOW (ref 101–111)
Creatinine, Ser: 0.85 mg/dL (ref 0.44–1.00)
GFR calc Af Amer: >60 mL/min (ref >60)
GLUCOSE: 121 mg/dL — AB (ref 65–99)
POTASSIUM: 2.9 mmol/L — AB (ref 3.5–5.1)
Sodium: 131 mmol/L — ABNORMAL LOW (ref 135–145)
TOTAL PROTEIN: 7.6 g/dL (ref 6.5–8.1)

## 2015-04-06 LAB — SAMPLE TO BLOOD BANK

## 2015-04-06 MED ORDER — HEPARIN SOD (PORK) LOCK FLUSH 100 UNIT/ML IV SOLN
500.0000 [IU] | Freq: Once | INTRAVENOUS | Status: AC
  Administered 2015-04-06: 500 [IU] via INTRAVENOUS
  Filled 2015-04-06: qty 5

## 2015-04-06 MED ORDER — SODIUM CHLORIDE 0.9 % IV SOLN
Freq: Once | INTRAVENOUS | Status: AC
  Administered 2015-04-06: 12:00:00 via INTRAVENOUS
  Filled 2015-04-06: qty 250

## 2015-04-06 MED ORDER — FAMOTIDINE IN NACL 20-0.9 MG/50ML-% IV SOLN
20.0000 mg | Freq: Once | INTRAVENOUS | Status: AC
  Administered 2015-04-06: 20 mg via INTRAVENOUS
  Filled 2015-04-06: qty 50

## 2015-04-06 MED ORDER — PACLITAXEL CHEMO INJECTION 300 MG/50ML
80.0000 mg/m2 | Freq: Once | INTRAVENOUS | Status: AC
  Administered 2015-04-06: 162 mg via INTRAVENOUS
  Filled 2015-04-06: qty 27

## 2015-04-06 MED ORDER — SODIUM CHLORIDE 0.9 % IV SOLN: 40.0000 meq | Freq: Once | INTRAVENOUS | Status: DC

## 2015-04-06 MED ORDER — SODIUM CHLORIDE 0.9% FLUSH
10.0000 mL | Freq: Once | INTRAVENOUS | Status: AC
  Administered 2015-04-06: 10 mL via INTRAVENOUS
  Filled 2015-04-06: qty 10

## 2015-04-06 MED ORDER — SODIUM CHLORIDE 0.9 % IV SOLN
Freq: Once | INTRAVENOUS | Status: AC
  Administered 2015-04-06: 11:00:00 via INTRAVENOUS
  Filled 2015-04-06: qty 4

## 2015-04-06 MED ORDER — SODIUM CHLORIDE 0.9 % IV SOLN
Freq: Once | INTRAVENOUS | Status: AC
  Administered 2015-04-06: 10:00:00 via INTRAVENOUS
  Filled 2015-04-06: qty 1000

## 2015-04-06 MED ORDER — DIPHENHYDRAMINE HCL 50 MG/ML IJ SOLN
50.0000 mg | Freq: Once | INTRAMUSCULAR | Status: AC
  Administered 2015-04-06: 50 mg via INTRAVENOUS
  Filled 2015-04-06: qty 1

## 2015-04-06 NOTE — Progress Notes (Signed)
Patient presents to clinic today for IV taxol. The patient has a potassium level of 2.9 today.  MD entered order for IV potassium. I instructed the patient per md order to have patient continue taking her oral potassium daily. She is currently taking potassium 20 meq 1 tablet twice daily. She does report leg cramps this week when walking, which "attributed these symptoms to her low potassium level." I asked the patient to increase her diet in potassium (examples given to patient: mash potatoes, apple sauce, prunes). Teach back process performed with the patient.  She also reports that there has been no change in her neuropathy.  She is tolerating the gabapentin per patient. MD made aware of neuropathy.  Potassium critical value of 2.9 was called to [facility] RN at 955am. Dr. Rogue Bussing informed at 1000am read back process performed with md and tech.

## 2015-04-10 ENCOUNTER — Other Ambulatory Visit: Payer: Self-pay | Admitting: *Deleted

## 2015-04-10 DIAGNOSIS — C50911 Malignant neoplasm of unspecified site of right female breast: Secondary | ICD-10-CM

## 2015-04-13 ENCOUNTER — Inpatient Hospital Stay: Payer: 59 | Attending: Internal Medicine

## 2015-04-13 ENCOUNTER — Inpatient Hospital Stay (HOSPITAL_BASED_OUTPATIENT_CLINIC_OR_DEPARTMENT_OTHER): Payer: 59 | Admitting: Internal Medicine

## 2015-04-13 ENCOUNTER — Inpatient Hospital Stay: Payer: 59

## 2015-04-13 VITALS — BP 133/81 | HR 104 | Temp 99.7°F | Resp 20 | Ht 65.0 in | Wt 187.2 lb

## 2015-04-13 DIAGNOSIS — E876 Hypokalemia: Secondary | ICD-10-CM

## 2015-04-13 DIAGNOSIS — Z171 Estrogen receptor negative status [ER-]: Secondary | ICD-10-CM | POA: Diagnosis not present

## 2015-04-13 DIAGNOSIS — J45909 Unspecified asthma, uncomplicated: Secondary | ICD-10-CM | POA: Diagnosis not present

## 2015-04-13 DIAGNOSIS — C50911 Malignant neoplasm of unspecified site of right female breast: Secondary | ICD-10-CM

## 2015-04-13 DIAGNOSIS — Z853 Personal history of malignant neoplasm of breast: Secondary | ICD-10-CM | POA: Diagnosis not present

## 2015-04-13 DIAGNOSIS — G629 Polyneuropathy, unspecified: Secondary | ICD-10-CM | POA: Diagnosis not present

## 2015-04-13 DIAGNOSIS — Z79899 Other long term (current) drug therapy: Secondary | ICD-10-CM

## 2015-04-13 DIAGNOSIS — I10 Essential (primary) hypertension: Secondary | ICD-10-CM | POA: Insufficient documentation

## 2015-04-13 DIAGNOSIS — K219 Gastro-esophageal reflux disease without esophagitis: Secondary | ICD-10-CM | POA: Diagnosis not present

## 2015-04-13 DIAGNOSIS — Z7982 Long term (current) use of aspirin: Secondary | ICD-10-CM | POA: Insufficient documentation

## 2015-04-13 DIAGNOSIS — E669 Obesity, unspecified: Secondary | ICD-10-CM | POA: Insufficient documentation

## 2015-04-13 DIAGNOSIS — C50912 Malignant neoplasm of unspecified site of left female breast: Secondary | ICD-10-CM

## 2015-04-13 LAB — COMPREHENSIVE METABOLIC PANEL
ALT: 27 U/L (ref 14–54)
ANION GAP: 4 — AB (ref 5–15)
AST: 23 U/L (ref 15–41)
Albumin: 3.8 g/dL (ref 3.5–5.0)
Alkaline Phosphatase: 48 U/L (ref 38–126)
BUN: 17 mg/dL (ref 6–20)
CALCIUM: 9 mg/dL (ref 8.9–10.3)
CHLORIDE: 101 mmol/L (ref 101–111)
CO2: 28 mmol/L (ref 22–32)
Creatinine, Ser: 0.82 mg/dL (ref 0.44–1.00)
GFR calc Af Amer: >60 mL/min (ref >60)
Glucose, Bld: 117 mg/dL — ABNORMAL HIGH (ref 65–99)
Potassium: 3.2 mmol/L — ABNORMAL LOW (ref 3.5–5.1)
SODIUM: 133 mmol/L — AB (ref 135–145)
TOTAL PROTEIN: 7.2 g/dL (ref 6.5–8.1)
Total Bilirubin: 0.8 mg/dL (ref 0.3–1.2)

## 2015-04-13 LAB — CBC WITH DIFFERENTIAL/PLATELET
Basophils Absolute: 0 10*3/uL (ref 0–0.1)
Basophils Relative: 1 %
EOS ABS: 0 10*3/uL (ref 0–0.7)
EOS PCT: 1 %
HCT: 27.1 % — ABNORMAL LOW (ref 35.0–47.0)
Hemoglobin: 9.4 g/dL — ABNORMAL LOW (ref 12.0–16.0)
LYMPHS ABS: 0.4 10*3/uL — AB (ref 1.0–3.6)
LYMPHS PCT: 19 %
MCH: 32 pg (ref 26.0–34.0)
MCHC: 34.7 g/dL (ref 32.0–36.0)
MCV: 92.2 fL (ref 80.0–100.0)
MONO ABS: 0.3 10*3/uL (ref 0.2–0.9)
MONOS PCT: 14 %
Neutro Abs: 1.5 10*3/uL (ref 1.4–6.5)
Neutrophils Relative %: 65 %
PLATELETS: 200 10*3/uL (ref 150–440)
RBC: 2.94 MIL/uL — AB (ref 3.80–5.20)
RDW: 21.4 % — AB (ref 11.5–14.5)
WBC: 2.3 10*3/uL — ABNORMAL LOW (ref 3.6–11.0)

## 2015-04-13 LAB — SAMPLE TO BLOOD BANK

## 2015-04-13 MED ORDER — HEPARIN SOD (PORK) LOCK FLUSH 100 UNIT/ML IV SOLN
500.0000 [IU] | Freq: Once | INTRAVENOUS | Status: AC
  Administered 2015-04-13: 500 [IU] via INTRAVENOUS
  Filled 2015-04-13: qty 5

## 2015-04-13 MED ORDER — SODIUM CHLORIDE 0.9% FLUSH
10.0000 mL | INTRAVENOUS | Status: DC | PRN
  Administered 2015-04-13: 10 mL via INTRAVENOUS
  Filled 2015-04-13: qty 10

## 2015-04-13 NOTE — Progress Notes (Signed)
[place] OFFICE PROGRESS NOTE  Patient Care Team: Guadalupe Maple, MD as PCP - General (Unknown Physician Specialty) Robert Bellow, MD (General Surgery) Guadalupe Maple, MD (Family Medicine)   SUMMARY OF ONCOLOGIC HISTORY:  # MAY 2016- RIGHT BREAST CANCER- Triple negative [T=0.5cm;G-3; margins-Neg]; Mammaprint- 0.814/molecular basal type [5 year risk- 22%; 10 year risk-29%] AC q3 W- taxol q w x 8 12 [sec to PN; finished Feb 2017].   # 2012 LEFT BREAST CA STAGE I s/p Lumpec [T1 (0.7CM);Triple Neg; G-3]; s/p mammosite RT; No adj chemo  #  Mild Anemia/M-protein 0.8gm/dl [sep 2016]  INTERVAL HISTORY:  A pleasant 55 year old female patient with above history of stage I right-sided triple negative breast cancer currently on adjuvant chemotherapy status post 8 treatments of weekly Taxol.   Patient continues to have tingling and numbness of her lower extremities and hands. Patient states at times she is clumsy;  And has a tendency to fall. She has not fallen.  She is currently on 600 of Neurontin 3 times a day. Denies any dizziness or falls.  Continues to have mild-moderate fatigue.  Fatigue seems to be improved post blood transfusion. Otherwise no nausea no vomiting. Otherwise no chest pain shortness of breath or cough.  REVIEW OF SYSTEMS:  A complete 10 point review of system is done which is negative except mentioned above/history of present illness.   PAST MEDICAL HISTORY :  Past Medical History  Diagnosis Date  . Seasonal allergies   . Unspecified essential hypertension   . Asthma   . 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 Dry eyes 2012  . Breast screening, unspecified   . Lump or mass in breast   . Special screening for malignant neoplasms, colon   . Obesity,  unspecified   . Screening for obesity   . Breast cyst 2010    cyst- drained  . Constipation   . Kidney stone   . GERD (gastroesophageal reflux disease)   . Anemia   . Malignant neoplasm of breast (female), unspecified site 07/08/2014    Right breast, 5 mm, T1a,N0, triple negative; high risk for recurrence on Mammoprint.  . Malignant neoplasm of upper-outer quadrant of female breast ([place]) 05/08/2010    Left breast: T1b, N0, M); triple negative, DCIS present. No chemotherapy. MammoSite radiation.  . Breast cancer ([place])   . Hematuria   . Pancreatic lesion     PAST SURGICAL HISTORY :   Past Surgical History  Procedure Laterality Date  . Cesarean section  1988  . Colonoscopy  2011    Dr. Dionne Milo  . Breast mammosite  April 2012    mammosite placement and removal   . Kidney stone surgery  2003    stones removed by Dr. Quillian Quince- stent placed/removed  . Tubal ligation    . Breast lumpectomy with axillary lymph node biopsy Right 08/01/2014    Procedure: BREAST LUMPECTOMY WITH AXILLARY LYMPH NODE BIOPSY;  Surgeon: Robert Bellow, MD;  Location: [facility] ORS;  Service: General;  Laterality: Right;  . Sentinel node biopsy Right 08/01/2014    Procedure: SENTINEL NODE BIOPSY;  Surgeon: Robert Bellow, MD;  Location: [facility] ORS;  Service: General;  Laterality: Right;  . Breast surgery Left 2012    left breast wide excision, sentinel node,  MammoSite  . Breast cyst aspiration  2010  . Breast surgery Right 08/06/2014    Wide excision/sentinel node biopsy, T1a, N0 triple negative, MammoSite  . Portacath placement Left 11/09/2014    Procedure: INSERTION PORT-A-CATH;  Surgeon: Robert Bellow, MD;  Location: [facility] OR[facility];  Service: General;  Laterality: Left;    FAMILY HISTORY :   Family History  Problem Relation Age of Onset  . Ovarian cancer Mother   . Diabetes Mother   . Cancer Father     prostate  . Diabetes Father     SOCIAL HISTORY:   Social History  Substance Use Topics  . Smoking  status: Never Smoker   . Smokeless tobacco: Never Used  . Alcohol Use: Yes     Comment: occasionally    ALLERGIES:  is allergic to shellfish allergy and tape.  MEDICATIONS:  Current Outpatient Prescriptions  Medication Sig Dispense Refill  . albuterol (PROVENTIL HFA;VENTOLIN HFA) 108 (90 BASE) MCG/ACT inhaler Inhale 2 puffs into the lungs every 6 (six) hours as needed for wheezing or shortness of breath.    Marland Kitchen aspirin 81 MG tablet Take 81 mg by mouth daily.    . bisacodyl (DULCOLAX) 5 MG EC tablet Take 5 mg by mouth daily as needed for moderate constipation.    . Cholecalciferol (VITAMIN D3) 1000 UNITS CAPS Take 1 capsule by mouth daily.     Marland Kitchen esomeprazole (NEXIUM) 40 MG capsule Take 40 mg by mouth 2 (two) times daily as needed (acid reflux).     . gabapentin (NEURONTIN) 300 MG capsule Take 2 capsules (600 mg total) by mouth 3 (three) times daily. Start out taking 1 tablet at bedtime daily. Gradually increase dose to three times daily until tolerated. 180 capsule 3  . hydrochlorothiazide (HYDRODIURIL) 12.5 MG tablet Take 1 tablet (12.5 mg total) by mouth daily. 90 tablet 1  . lidocaine-prilocaine (EMLA) cream Apply cream 1 hour before chemotherapy treatment, place saran wrap over the cream to avoid getting on clothes. 30 g 1  . polyethylene glycol (MIRALAX / GLYCOLAX) packet Take 17 g by mouth every other day.    . potassium chloride SA (K-DUR,KLOR-CON) 20 MEQ tablet Take 1 tablet (20 mEq total) by mouth 2 (two) times daily. 60 tablet 0  . promethazine (PHENERGAN) 25 MG tablet Take 1 tablet (25 mg total) by mouth every 6 (six) hours as needed for nausea or vomiting. 60 tablet 2  . fluconazole (DIFLUCAN) 100 MG tablet Take 1 tablet (100 mg total) by mouth daily. (Patient not taking: Reported on 04/13/2015) 4 tablet 0  . loratadine (CLARITIN) 10 MG tablet Take 10 mg by mouth daily. Reported on 04/13/2015     No current facility-administered medications for this visit.   Facility-Administered  Medications Ordered in Other Visits  Medication Dose Route Frequency Provider Last Rate Last Dose  . heparin lock flush 100 unit/mL  500 Units Intravenous Once Cammie Sickle, MD      . sodium chloride 0.9 % injection 10 mL  10 mL Intravenous PRN Leia Alf, MD   10 mL at 11/10/14 1138  . sodium chloride flush (NS) 0.9 % injection 10 mL  10 mL Intravenous PRN Cammie Sickle, MD   10 mL at 04/13/15 0853    PHYSICAL EXAMINATION: ECOG PERFORMANCE STATUS: 1 - Symptomatic but completely ambulatory  BP 133/81 mmHg  Pulse 104  Temp(Src) 99.7 F (37.6 C) (Tympanic)  Resp 20  Ht 5\' 5"  (1.651 m)  Wt 187 lb 2.7  oz (84.9 kg)  BMI 31.15 kg/m2  Filed Weights   04/13/15 0919  Weight: 187 lb 2.7 oz (84.9 kg)    GENERAL: Well-nourished well-developed; Alert, no distress and comfortable.   Accompanied by  family  EYES: no pallor or icterus OROPHARYNX: no thrush or ulceration; good dentition  NECK: supple, no masses felt LYMPH:  no palpable lymphadenopathy in the cervical, axillary or inguinal regions LUNGS: clear to auscultation and  No wheeze or crackles HEART/CVS: regular rate & rhythm and no murmurs; No lower extremity edema ABDOMEN:abdomen soft, non-tender and normal bowel sounds Musculoskeletal:no cyanosis of digits and no clubbing  PSYCH: alert & oriented x 3 with fluent speech NEURO: no focal motor/sensory deficits SKIN:  no rashes or significant lesions; Mediport in place no erythema and no discharge no tenderness.  LABORATORY DATA:  I have reviewed the data as listed    Component Value Date/Time   NA 133* 04/13/2015 0853   NA 140 07/16/2011 2130   K 3.2* 04/13/2015 0853   K 3.7 07/16/2011 2130   CL 101 04/13/2015 0853   CL 106 07/16/2011 2130   CO2 28 04/13/2015 0853   CO2 27 07/16/2011 2130   GLUCOSE 117* 04/13/2015 0853   GLUCOSE 93 07/16/2011 2130   BUN 17 04/13/2015 0853   BUN 16 07/16/2011 2130   CREATININE 0.82 04/13/2015 0853   CREATININE 0.95  07/16/2011 2130   CALCIUM 9.0 04/13/2015 0853   CALCIUM 9.0 07/16/2011 2130   PROT 7.2 04/13/2015 0853   ALBUMIN 3.8 04/13/2015 0853   AST 23 04/13/2015 0853   ALT 27 04/13/2015 0853   ALKPHOS 48 04/13/2015 0853   BILITOT 0.8 04/13/2015 0853   GFRNONAA >60 04/13/2015 0853   GFRNONAA >60 07/16/2011 2130   GFRAA >60 04/13/2015 0853   GFRAA >60 07/16/2011 2130    No results found for: SPEP, UPEP  Lab Results  Component Value Date   WBC 2.3* 04/13/2015   NEUTROABS 1.5 04/13/2015   HGB 9.4* 04/13/2015   HCT 27.1* 04/13/2015   MCV 92.2 04/13/2015   PLT 200 04/13/2015      Chemistry      Component Value Date/Time   NA 133* 04/13/2015 0853   NA 140 07/16/2011 2130   K 3.2* 04/13/2015 0853   K 3.7 07/16/2011 2130   CL 101 04/13/2015 0853   CL 106 07/16/2011 2130   CO2 28 04/13/2015 0853   CO2 27 07/16/2011 2130   BUN 17 04/13/2015 0853   BUN 16 07/16/2011 2130   CREATININE 0.82 04/13/2015 0853   CREATININE 0.95 07/16/2011 2130      Component Value Date/Time   CALCIUM 9.0 04/13/2015 0853   CALCIUM 9.0 07/16/2011 2130   ALKPHOS 48 04/13/2015 0853   AST 23 04/13/2015 0853   ALT 27 04/13/2015 0853   BILITOT 0.8 04/13/2015 0853       RADIOGRAPHIC STUDIES: I have personally reviewed the radiological images as listed and agreed with the findings in the report. No results found.   ASSESSMENT & PLAN:   # RIGHT BREAST CANCER STAGE I - triple negative   Currently on adjuvant Taxol weekly times 8. Patient tolerating chemotherapy fairly well except for-continued anemia/and neuropathy/ anemia/ severe hypokalemia[see discussion below].  #  Given the patient's breast cancer was stage I/  And the multiple difficulties with continued chemotherapy-  I would recommend stopping  Taxol at this time.  She finished a total of 8 weekly treatments.  She is agreeable.   #  Peripheral neuropathy grade 2- on Neurontin to 600 milligrams 3 times a day. Improving continue the current dose.   Hopefully stopping the Taxol help her neuropathy.  #  Severe anemia needing blood transfusion-  Today hemoglobin is  9.2.  #  Severe hypokalemia-  On oral supplementation.  Today the potassium is 3.2.   #  Patient follow-up with me in 6 weeks/ labs.  #  Will review genetic counseling at next visit.  # 25 minutes face-to-face with the patient discussing the above plan of care; more than 50% of time spent on prognosis/ natural history; counseling and coordination.     Cammie Sickle, MD 04/13/2015 9:42 AM

## 2015-04-13 NOTE — Patient Instructions (Signed)
No chemotherapy today. Continue taking your potassium as instructed.

## 2015-04-13 NOTE — Progress Notes (Signed)
Pt here today she states she has felt blah and today she has temp 99.7.  Pt appetite ok, not the best but she drinks lots of fluid. Constipation- taking miralax every other day, sometimes takes stool softner but mostly on the weekend. Pt states neuropathy is about the same. Her feet feels uneasy, hard to feel bottom of feet and she has to be careful to watch her step. She has 1 episode of diarrhea and vomiting and then it was over and she felt better last week.

## 2015-04-16 ENCOUNTER — Ambulatory Visit
Admission: RE | Admit: 2015-04-16 | Discharge: 2015-04-16 | Disposition: A | Discharge status: Home / Self Care | Payer: 59 | Source: Ambulatory Visit | Attending: Radiation Oncology | Admitting: Radiation Oncology

## 2015-04-16 ENCOUNTER — Encounter: Payer: Self-pay | Admitting: Radiation Oncology

## 2015-04-16 VITALS — BP 151/83 | HR 117 | Temp 99.2°F | Resp 18 | Wt 185.2 lb

## 2015-04-16 DIAGNOSIS — C50411 Malignant neoplasm of upper-outer quadrant of right female breast: Secondary | ICD-10-CM

## 2015-04-16 NOTE — Progress Notes (Signed)
Radiation Oncology Follow up Note  Name: Adrienne [name]   Date:   04/16/2015 MRN:  [id number] DOB: December 03, 1960    This 55 y.o. female presents to the clinic today for follow-up for breast cancer of the left breast started status post accelerated partial breast radiation for triple negative stage I disease.Marland Kitchen  REFERRING PROVIDER: Robert Bellow, MD  HPI: Patient is a 55 year old female now out 6 months having completed accelerated partial breast radiation to her left breast for stage I triple negative invasive mammary carcinoma. Member or friend showed a high risk of recurrence she has been treated with. Taxol although recently that was discontinued based on progressive neuropathy. She is seen today in routine follow-up and is doing well she specifically denies breast tenderness cough or bone pain.  COMPLICATIONS OF TREATMENT: none  FOLLOW UP COMPLIANCE: keeps appointments   PHYSICAL EXAM:  BP 151/83 mmHg  Pulse 117  Temp(Src) 99.2 F (37.3 C)  Resp 18  Wt 185 lb 3 oz (84 kg) Lungs are clear to A&P cardiac examination essentially unremarkable with regular rate and rhythm. She does have a palpable mass or in the left breast in the area of the lumpectomy which is consistent with high dose radiation and surgery. The left breast shows no dominant mass in 2 positions examined. Incision is well-healed. No axillary or supraclavicular adenopathy is appreciated. Cosmetic result is excellent. Well-developed well-nourished patient in NAD. HEENT reveals PERLA, EOMI, discs not visualized.  Oral cavity is clear. No oral mucosal lesions are identified. Neck is clear without evidence of cervical or supraclavicular adenopathy. Lungs are clear to A&P. Cardiac examination is essentially unremarkable with regular rate and rhythm without murmur rub or thrill. Abdomen is benign with no organomegaly or masses noted. Motor sensory and DTR levels are equal and symmetric in the upper and lower extremities. Cranial  nerves II through XII are grossly intact. Proprioception is intact. No peripheral adenopathy or edema is identified. No motor or sensory levels are noted. Crude visual fields are within normal range.  RADIOLOGY RESULTS: Patient is scheduled for repeat mammograms in April  PLAN: Present time patient is doing well 6 months out from accelerated partial breast irradiation she's just completed chemotherapy. She will not be on aromatase inhibitor therapy based on the triple negative nature of her disease. I have asked to see her back in 6 months for follow-up and then will go to once your follow-up visits. She continues close follow-up care with surgeon and medical oncology.  I would like to take this opportunity for allowing me to participate in the care of your patient.Armstead Peaks., MD

## 2015-04-18 ENCOUNTER — Encounter: Payer: Self-pay | Admitting: *Deleted

## 2015-05-08 ENCOUNTER — Telehealth: Payer: Self-pay | Admitting: *Deleted

## 2015-05-08 NOTE — Telephone Encounter (Signed)
Nailbeds on left and right fingers have white streaks. She reports that her nailbeds have a distinct rotten oder. There is are no signs of redness on the fingertips or pus. She is keeping her hands clean and dry. MD states that this is a side effect from the taxol. I offered her an appointment to md next week, but she declined.

## 2015-05-08 NOTE — Telephone Encounter (Signed)
Pt called to say that she feels like her nails have a bad smell to them like they are rotten. She would like nurse to call her back and discuss this

## 2015-05-22 NOTE — Addendum Note (Signed)
Encounter addended by: Noreene Filbert, MD on: 05/22/2015 10:11 AM<BR>     Documentation filed: Clinical Notes

## 2015-05-22 NOTE — Progress Notes (Signed)
This is an addendum to prior note dated 03/16/2015. Patient had recently completed accelerated partial breast irradiation to her right breast for triple negative invasive mammary carcinoma. She had completed accelerated partial breast radiation to her left breast4 years prior. She completed accelerated partial breast radiation to her right breast 09/08/2014. Everything else in note is accurate she is doing well with no evidence of disease.

## 2015-05-25 ENCOUNTER — Inpatient Hospital Stay (HOSPITAL_BASED_OUTPATIENT_CLINIC_OR_DEPARTMENT_OTHER): Payer: 59 | Admitting: Internal Medicine

## 2015-05-25 ENCOUNTER — Inpatient Hospital Stay: Payer: 59

## 2015-05-25 ENCOUNTER — Telehealth: Payer: Self-pay | Admitting: *Deleted

## 2015-05-25 ENCOUNTER — Inpatient Hospital Stay: Payer: 59 | Attending: Internal Medicine

## 2015-05-25 VITALS — BP 131/85 | HR 90 | Temp 96.7°F | Resp 18 | Wt 185.2 lb

## 2015-05-25 DIAGNOSIS — J45909 Unspecified asthma, uncomplicated: Secondary | ICD-10-CM | POA: Diagnosis not present

## 2015-05-25 DIAGNOSIS — Z9221 Personal history of antineoplastic chemotherapy: Secondary | ICD-10-CM | POA: Diagnosis not present

## 2015-05-25 DIAGNOSIS — I10 Essential (primary) hypertension: Secondary | ICD-10-CM | POA: Diagnosis not present

## 2015-05-25 DIAGNOSIS — C50911 Malignant neoplasm of unspecified site of right female breast: Secondary | ICD-10-CM | POA: Diagnosis not present

## 2015-05-25 DIAGNOSIS — E876 Hypokalemia: Secondary | ICD-10-CM | POA: Insufficient documentation

## 2015-05-25 DIAGNOSIS — Z171 Estrogen receptor negative status [ER-]: Secondary | ICD-10-CM | POA: Insufficient documentation

## 2015-05-25 DIAGNOSIS — Z79899 Other long term (current) drug therapy: Secondary | ICD-10-CM | POA: Diagnosis not present

## 2015-05-25 DIAGNOSIS — G629 Polyneuropathy, unspecified: Secondary | ICD-10-CM | POA: Insufficient documentation

## 2015-05-25 DIAGNOSIS — Z853 Personal history of malignant neoplasm of breast: Secondary | ICD-10-CM | POA: Diagnosis not present

## 2015-05-25 DIAGNOSIS — K219 Gastro-esophageal reflux disease without esophagitis: Secondary | ICD-10-CM | POA: Diagnosis not present

## 2015-05-25 DIAGNOSIS — Z7982 Long term (current) use of aspirin: Secondary | ICD-10-CM | POA: Insufficient documentation

## 2015-05-25 DIAGNOSIS — C50912 Malignant neoplasm of unspecified site of left female breast: Secondary | ICD-10-CM

## 2015-05-25 LAB — CBC WITH DIFFERENTIAL/PLATELET
BASOS PCT: 1 %
Basophils Absolute: 0 10*3/uL (ref 0–0.1)
Eosinophils Absolute: 0.2 10*3/uL (ref 0–0.7)
Eosinophils Relative: 3 %
HEMATOCRIT: 28.1 % — AB (ref 35.0–47.0)
HEMOGLOBIN: 9.6 g/dL — AB (ref 12.0–16.0)
Lymphocytes Relative: 19 %
Lymphs Abs: 1 10*3/uL (ref 1.0–3.6)
MCH: 32.9 pg (ref 26.0–34.0)
MCHC: 34.3 g/dL (ref 32.0–36.0)
MCV: 95.9 fL (ref 80.0–100.0)
MONOS PCT: 10 %
Monocytes Absolute: 0.6 10*3/uL (ref 0.2–0.9)
NEUTROS ABS: 3.8 10*3/uL (ref 1.4–6.5)
NEUTROS PCT: 67 %
Platelets: 187 10*3/uL (ref 150–440)
RBC: 2.93 MIL/uL — AB (ref 3.80–5.20)
RDW: 19.7 % — ABNORMAL HIGH (ref 11.5–14.5)
WBC: 5.6 10*3/uL (ref 3.6–11.0)

## 2015-05-25 LAB — COMPREHENSIVE METABOLIC PANEL
ALBUMIN: 4.1 g/dL (ref 3.5–5.0)
ALK PHOS: 56 U/L (ref 38–126)
ALT: 27 U/L (ref 14–54)
AST: 32 U/L (ref 15–41)
Anion gap: 7 (ref 5–15)
BILIRUBIN TOTAL: 1 mg/dL (ref 0.3–1.2)
BUN: 22 mg/dL — AB (ref 6–20)
CO2: 27 mmol/L (ref 22–32)
CREATININE: 0.89 mg/dL (ref 0.44–1.00)
Calcium: 9.3 mg/dL (ref 8.9–10.3)
Chloride: 102 mmol/L (ref 101–111)
GFR calc Af Amer: >60 mL/min (ref >60)
GLUCOSE: 168 mg/dL — AB (ref 65–99)
POTASSIUM: 2.8 mmol/L — AB (ref 3.5–5.1)
Sodium: 136 mmol/L (ref 135–145)
Total Protein: 7.9 g/dL (ref 6.5–8.1)

## 2015-05-25 MED ORDER — SODIUM CHLORIDE 0.9% FLUSH
10.0000 mL | Freq: Once | INTRAVENOUS | Status: AC
  Administered 2015-05-25: 10 mL via INTRAVENOUS
  Filled 2015-05-25: qty 10

## 2015-05-25 MED ORDER — POTASSIUM CHLORIDE CRYS ER 20 MEQ PO TBCR: 40.0000 meq | EXTENDED_RELEASE_TABLET | Freq: Two times a day (BID) | ORAL | Status: DC

## 2015-05-25 MED ORDER — HEPARIN SOD (PORK) LOCK FLUSH 100 UNIT/ML IV SOLN
500.0000 [IU] | Freq: Once | INTRAVENOUS | Status: AC
  Administered 2015-05-25: 500 [IU] via INTRAVENOUS

## 2015-05-25 MED ORDER — LISINOPRIL 10 MG PO TABS: 10.0000 mg | ORAL_TABLET | Freq: Every day | ORAL | Status: DC

## 2015-05-25 MED ORDER — HEPARIN SOD (PORK) LOCK FLUSH 100 UNIT/ML IV SOLN
INTRAVENOUS | Status: AC
  Filled 2015-05-25: qty 5

## 2015-05-25 NOTE — Progress Notes (Signed)
[place] OFFICE PROGRESS NOTE  Adrienne [name] Care Team: Guadalupe Maple, MD as PCP - General (Unknown Physician Specialty) Robert Bellow, MD (General Surgery) Guadalupe Maple, MD (Family Medicine)   SUMMARY OF ONCOLOGIC HISTORY:  # MAY 2016- RIGHT BREAST CANCER- Triple negative [T=0.5cm;G-3; margins-Neg]; Mammaprint- 0.814/molecular basal type [5 year risk- 22%; 10 year risk-29%] AC q3 W- taxol q w x 8 12 [sec to PN; finished Feb 2017].   # 2012 LEFT BREAST CA STAGE I s/p Lumpec [T1 (0.7CM);Triple Neg; G-3]; s/p mammosite RT; No adj chemo  #  Mild Anemia/M-protein 0.8gm/dl [sep 2016]  INTERVAL HISTORY:  Adrienne pleasant 55 year old female Adrienne [name] with above history of stage I right-sided triple negative breast cancer currently on adjuvant chemotherapy status post 8 treatments of weekly Taxol.   Adrienne [name] continues to have tingling and numbness of her lower extremities and hands. Adrienne [name] states at times she is clumsy;  And has Adrienne tendency to fall. She has not fallen.  She is currently on 300 of Neurontin 3 times Adrienne day. Adrienne [name] denies any significant muscle pain/arthralgias.   Continues to have mild-moderate fatigue- improving. Otherwise no nausea no vomiting. Otherwise no chest pain shortness of breath or cough.  REVIEW OF SYSTEMS:  Adrienne complete 10 point review of system is done which is negative except mentioned above/history of present illness.   PAST MEDICAL HISTORY :  Past Medical History  Diagnosis Date  . Seasonal allergies   . Unspecified essential hypertension   . Asthma   . 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 Dry eyes 2012  . Breast screening, unspecified   . Lump or mass in breast   . Special screening for malignant neoplasms, colon   . Obesity, unspecified   . Screening  for obesity   . Breast cyst 2010    cyst- drained  . Constipation   . Kidney stone   . GERD (gastroesophageal reflux disease)   . Anemia   . Malignant neoplasm of breast (female), unspecified site 07/08/2014    Right breast, 5 mm, T1a,N0, triple negative; high risk for recurrence on Mammoprint.  . Malignant neoplasm of upper-outer quadrant of female breast ([place]) 05/08/2010    Left breast: T1b, N0, M); triple negative, DCIS present. No chemotherapy. MammoSite radiation.  . Breast cancer ([place])   . Hematuria   . Pancreatic lesion     PAST SURGICAL HISTORY :   Past Surgical History  Procedure Laterality Date  . Cesarean section  1988  . Colonoscopy  2011    Dr. Dionne Milo  . Breast mammosite  April 2012    mammosite placement and removal   . Kidney stone surgery  2003    stones removed by Dr. Quillian Quince- stent placed/removed  . Tubal ligation    . Breast lumpectomy with axillary lymph node biopsy Right 08/01/2014    Procedure: BREAST LUMPECTOMY WITH AXILLARY LYMPH NODE BIOPSY;  Surgeon: Robert Bellow, MD;  Location: [facility] ORS;  Service: General;  Laterality: Right;  . Sentinel node biopsy Right 08/01/2014    Procedure: SENTINEL NODE BIOPSY;  Surgeon: Robert Bellow, MD;  Location: [facility] ORS;  Service: General;  Laterality: Right;  . Breast surgery Left 2012    left breast wide excision, sentinel node, MammoSite  . Breast cyst aspiration  2010  . Breast surgery Right 08/06/2014    Wide excision/sentinel node biopsy, T1a, N0 triple negative, MammoSite  . Portacath placement Left 11/09/2014    Procedure: INSERTION PORT-Adrienne-CATH;  Surgeon: Robert Bellow, MD;  Location: [facility] ORS;  Service: General;  Laterality: Left;    FAMILY HISTORY :   Family History  Problem Relation Age of Onset  . Ovarian cancer Mother   . Diabetes Mother   . Cancer Father     prostate  . Diabetes Father     SOCIAL HISTORY:   Social History  Substance Use Topics  . Smoking status: Never Smoker   .  Smokeless tobacco: Never Used  . Alcohol Use: Yes     Comment: occasionally    ALLERGIES:  is allergic to shellfish allergy and tape.  MEDICATIONS:  Current Outpatient Prescriptions  Medication Sig Dispense Refill  . albuterol (PROVENTIL HFA;VENTOLIN HFA) 108 (90 BASE) MCG/ACT inhaler Inhale 2 puffs into the lungs every 6 (six) hours as needed for wheezing or shortness of breath.    Marland Kitchen aspirin 81 MG tablet Take 81 mg by mouth daily.    . bisacodyl (DULCOLAX) 5 MG EC tablet Take 5 mg by mouth daily as needed for moderate constipation.    . Cholecalciferol (VITAMIN D3) 1000 UNITS CAPS Take 1 capsule by mouth daily.     Marland Kitchen esomeprazole (NEXIUM) 40 MG capsule Take 40 mg by mouth 2 (two) times daily as needed (acid reflux).     . gabapentin (NEURONTIN) 300 MG capsule Take 2 capsules (600 mg total) by mouth 3 (three) times daily. Start out taking 1 tablet at bedtime daily. Gradually increase dose to three times daily until tolerated. 180 capsule 3  . hydrochlorothiazide (HYDRODIURIL) 12.5 MG tablet Take 1 tablet (12.5 mg total) by mouth daily. 90 tablet 1  . lidocaine-prilocaine (EMLA) cream Apply cream 1 hour before chemotherapy treatment, place saran wrap over the cream to avoid getting on clothes. 30 g 1  . loratadine (CLARITIN) 10 MG tablet Take 10 mg by mouth daily. Reported on 04/13/2015    . polyethylene glycol (MIRALAX / GLYCOLAX) packet Take 17 g by mouth every other day.    . potassium chloride SA (K-DUR,KLOR-CON) 20 MEQ tablet Take 1 tablet (20 mEq total) by mouth 2 (two) times daily. 60 tablet 0  . promethazine (PHENERGAN) 25 MG tablet Take 1 tablet (25 mg total) by mouth every 6 (six) hours as needed for nausea or vomiting. 60 tablet 2   No current facility-administered medications for this visit.   Facility-Administered Medications Ordered in Other Visits  Medication Dose Route Frequency Provider Last Rate Last Dose  . sodium chloride 0.9 % injection 10 mL  10 mL Intravenous PRN  Leia Alf, MD   10 mL at 11/10/14 1138    PHYSICAL EXAMINATION: ECOG PERFORMANCE STATUS: 1 - Symptomatic but completely ambulatory  BP 131/85 mmHg  Pulse 90  Temp(Src) 96.7 F (35.9 C) (Tympanic)  Resp 18  Wt 185 lb 3 oz (84 kg)  Filed Weights   05/25/15 0927  Weight: 185 lb 3 oz (84 kg)    GENERAL: Well-nourished well-developed; Alert, no distress and comfortable.   She is alone.  EYES: no pallor or icterus OROPHARYNX: no thrush or ulceration; good dentition  NECK: supple, no masses felt LYMPH:  no palpable lymphadenopathy in the cervical, axillary or inguinal regions LUNGS: clear to auscultation and  No wheeze or crackles HEART/CVS: regular rate & rhythm and no murmurs; No lower  extremity edema ABDOMEN:abdomen soft, non-tender and normal bowel sounds Musculoskeletal:no cyanosis of digits and no clubbing  PSYCH: alert & oriented x 3 with fluent speech NEURO: no focal motor/sensory deficits SKIN:  no rashes or significant lesions; Mediport in place no erythema and no discharge no tenderness.  LABORATORY DATA:  I have reviewed the data as listed    Component Value Date/Time   NA 136 05/25/2015 0905   NA 140 07/16/2011 2130   K 2.8* 05/25/2015 0905   K 3.7 07/16/2011 2130   CL 102 05/25/2015 0905   CL 106 07/16/2011 2130   CO2 27 05/25/2015 0905   CO2 27 07/16/2011 2130   GLUCOSE 168* 05/25/2015 0905   GLUCOSE 93 07/16/2011 2130   BUN 22* 05/25/2015 0905   BUN 16 07/16/2011 2130   CREATININE 0.89 05/25/2015 0905   CREATININE 0.95 07/16/2011 2130   CALCIUM 9.3 05/25/2015 0905   CALCIUM 9.0 07/16/2011 2130   PROT 7.9 05/25/2015 0905   ALBUMIN 4.1 05/25/2015 0905   AST 32 05/25/2015 0905   ALT 27 05/25/2015 0905   ALKPHOS 56 05/25/2015 0905   BILITOT 1.0 05/25/2015 0905   GFRNONAA >60 05/25/2015 0905   GFRNONAA >60 07/16/2011 2130   GFRAA >60 05/25/2015 0905   GFRAA >60 07/16/2011 2130    No results found for: SPEP, UPEP  Lab Results  Component Value  Date   WBC 5.6 05/25/2015   NEUTROABS 3.8 05/25/2015   HGB 9.6* 05/25/2015   HCT 28.1* 05/25/2015   MCV 95.9 05/25/2015   PLT 187 05/25/2015      Chemistry      Component Value Date/Time   NA 136 05/25/2015 0905   NA 140 07/16/2011 2130   K 2.8* 05/25/2015 0905   K 3.7 07/16/2011 2130   CL 102 05/25/2015 0905   CL 106 07/16/2011 2130   CO2 27 05/25/2015 0905   CO2 27 07/16/2011 2130   BUN 22* 05/25/2015 0905   BUN 16 07/16/2011 2130   CREATININE 0.89 05/25/2015 0905   CREATININE 0.95 07/16/2011 2130      Component Value Date/Time   CALCIUM 9.3 05/25/2015 0905   CALCIUM 9.0 07/16/2011 2130   ALKPHOS 56 05/25/2015 0905   AST 32 05/25/2015 0905   ALT 27 05/25/2015 0905   BILITOT 1.0 05/25/2015 0905         ASSESSMENT & PLAN:   # RIGHT BREAST CANCER STAGE I - triple negative   S/p  on adjuvant Taxol weekly times 8. Adrienne [name] tolerating chemotherapy fairly well except for-continued anemia/and neuropathy/ anemia/ severe hypokalemia[see discussion below].Adrienne [name] stopped Taxol after 8 cycles and of January 2017.   # Peripheral neuropathy grade 2- on Neurontin 300 milligrams 3 times Adrienne day.Recommend going up to 600 3 times Adrienne day.  #  Severe anemia needing blood transfusion-  Today hemoglobin is  9.2.  #  Severe hypokalemia-  On oral supplementation.  Today the potassium is 2.8; recommend 40 of potassium twice Adrienne day for 2  week   # Hypertension- recommend holding hydrochlorothiazide; secondary to hypokalemia. Start lisinopril 20 mg Adrienne day. Discussed the potential side effects.  #  Adrienne [name] follow-up with me in 6 weeks/ labs.  #  Will review genetic counseling at next visit.  # 15 minutes face-to-face with the Adrienne [name] discussing the above plan of care; more than 50% of time spent on prognosis/ natural history; counseling and coordination.     Cammie Sickle, MD 05/25/2015 9:58 AM

## 2015-05-25 NOTE — Telephone Encounter (Signed)
RN left msg for patient to ensure that she was able to get her potassium, which was e-scribed to pt's pharmacy today. I also received a fax from [facility] for potassium mail order, we will hold off on submitting the request to [facility]. We will wait to submit until md determines whether a long-term script for potassium  is needed.

## 2015-05-25 NOTE — Progress Notes (Signed)
Patient's neuropathy has not improved with difficulty with buttons and has to be very careful when walking.  Fingernails are discolored and has an odor.  Received a call from [place] [facility]:  K+ result of 2.8 today.

## 2015-06-01 ENCOUNTER — Other Ambulatory Visit: Payer: Self-pay | Admitting: *Deleted

## 2015-06-01 DIAGNOSIS — T451X5A Adverse effect of antineoplastic and immunosuppressive drugs, initial encounter: Secondary | ICD-10-CM

## 2015-06-01 DIAGNOSIS — G620 Drug-induced polyneuropathy: Secondary | ICD-10-CM

## 2015-06-01 MED ORDER — GABAPENTIN 300 MG PO CAPS: 600.0000 mg | ORAL_CAPSULE | Freq: Three times a day (TID) | ORAL | Status: DC

## 2015-06-01 NOTE — Progress Notes (Signed)
[facility] fax Received for rx renewal for patient for gabapentin. RX sent to [facility]. Medication escribed to patient's pharmacy.

## 2015-06-06 ENCOUNTER — Telehealth: Payer: Self-pay | Admitting: *Deleted

## 2015-06-06 NOTE — Telephone Encounter (Signed)
This was just filled on 3/24

## 2015-06-07 ENCOUNTER — Other Ambulatory Visit: Payer: Self-pay | Admitting: *Deleted

## 2015-06-07 DIAGNOSIS — T451X5A Adverse effect of antineoplastic and immunosuppressive drugs, initial encounter: Secondary | ICD-10-CM

## 2015-06-07 DIAGNOSIS — G620 Drug-induced polyneuropathy: Secondary | ICD-10-CM

## 2015-06-07 MED ORDER — GABAPENTIN 300 MG PO CAPS: 600.0000 mg | ORAL_CAPSULE | Freq: Three times a day (TID) | ORAL | Status: DC

## 2015-06-08 ENCOUNTER — Telehealth: Payer: Self-pay | Admitting: *Deleted

## 2015-06-08 NOTE — Telephone Encounter (Signed)
Pt called x 2. Left msg on [facility] phone line. Pt inquiring whether gabapentin was sent to [facility] and whether she needs to continue to be on the prescription. Previous script sent to [place] last week. RX faxed to [facility] yesterday. Pt states that she does not have anymore gabapentin tablets left. Will contact patient to let her continue to take gabapentin as directed for provider.

## 2015-06-08 NOTE — Telephone Encounter (Signed)
Called patient to inform her that prescription for Gabapentin was sent to [facility] yesterday.  Also informed her that a previous prescription had been sent to [place].  If she needs it before it is shipped from [facility] we will be glad to send her a 30 day supply.  She states that [facility] reached out to her this morning and she is good until the shipment arrives.  Very thankful for call.

## 2015-06-19 ENCOUNTER — Other Ambulatory Visit: Payer: Self-pay | Admitting: *Deleted

## 2015-06-19 DIAGNOSIS — G620 Drug-induced polyneuropathy: Secondary | ICD-10-CM

## 2015-06-19 DIAGNOSIS — T451X5A Adverse effect of antineoplastic and immunosuppressive drugs, initial encounter: Secondary | ICD-10-CM

## 2015-06-19 MED ORDER — GABAPENTIN 300 MG PO CAPS: 600.0000 mg | ORAL_CAPSULE | Freq: Three times a day (TID) | ORAL | Status: DC

## 2015-06-19 NOTE — Telephone Encounter (Signed)
Called to report that she still has not received her Gabapentin, I called [facility] and they stated that they did not received the fax we sent on 06/07/15, so I spoke with the pharmacist and gave a verbal order for it and asked that it be expedited. He said they would then I notified patient of rx being called in and she thanked me for assisting her in this matter

## 2015-06-28 ENCOUNTER — Ambulatory Visit
Admission: RE | Admit: 2015-06-28 | Discharge: 2015-06-28 | Disposition: A | Discharge status: Home / Self Care | Payer: 59 | Source: Ambulatory Visit | Attending: General Surgery | Admitting: General Surgery

## 2015-06-28 ENCOUNTER — Other Ambulatory Visit: Payer: Self-pay | Admitting: General Surgery

## 2015-06-28 DIAGNOSIS — Z9889 Other specified postprocedural states: Secondary | ICD-10-CM | POA: Insufficient documentation

## 2015-06-28 DIAGNOSIS — Z853 Personal history of malignant neoplasm of breast: Secondary | ICD-10-CM | POA: Diagnosis present

## 2015-06-28 DIAGNOSIS — C50911 Malignant neoplasm of unspecified site of right female breast: Secondary | ICD-10-CM

## 2015-07-03 ENCOUNTER — Other Ambulatory Visit: Payer: Self-pay | Admitting: *Deleted

## 2015-07-03 ENCOUNTER — Inpatient Hospital Stay: Payer: 59

## 2015-07-03 ENCOUNTER — Inpatient Hospital Stay: Payer: 59 | Attending: Internal Medicine

## 2015-07-03 DIAGNOSIS — Z9221 Personal history of antineoplastic chemotherapy: Secondary | ICD-10-CM | POA: Insufficient documentation

## 2015-07-03 DIAGNOSIS — Z853 Personal history of malignant neoplasm of breast: Secondary | ICD-10-CM | POA: Insufficient documentation

## 2015-07-03 DIAGNOSIS — C50912 Malignant neoplasm of unspecified site of left female breast: Secondary | ICD-10-CM

## 2015-07-03 DIAGNOSIS — D649 Anemia, unspecified: Secondary | ICD-10-CM | POA: Insufficient documentation

## 2015-07-03 DIAGNOSIS — E876 Hypokalemia: Secondary | ICD-10-CM | POA: Insufficient documentation

## 2015-07-03 DIAGNOSIS — Z171 Estrogen receptor negative status [ER-]: Secondary | ICD-10-CM | POA: Insufficient documentation

## 2015-07-03 DIAGNOSIS — C801 Malignant (primary) neoplasm, unspecified: Secondary | ICD-10-CM

## 2015-07-03 DIAGNOSIS — I10 Essential (primary) hypertension: Secondary | ICD-10-CM | POA: Insufficient documentation

## 2015-07-03 DIAGNOSIS — K219 Gastro-esophageal reflux disease without esophagitis: Secondary | ICD-10-CM | POA: Insufficient documentation

## 2015-07-03 DIAGNOSIS — Z79899 Other long term (current) drug therapy: Secondary | ICD-10-CM | POA: Insufficient documentation

## 2015-07-03 DIAGNOSIS — Z7982 Long term (current) use of aspirin: Secondary | ICD-10-CM | POA: Insufficient documentation

## 2015-07-03 DIAGNOSIS — C50911 Malignant neoplasm of unspecified site of right female breast: Secondary | ICD-10-CM | POA: Insufficient documentation

## 2015-07-03 DIAGNOSIS — G629 Polyneuropathy, unspecified: Secondary | ICD-10-CM | POA: Insufficient documentation

## 2015-07-03 MED ORDER — SODIUM CHLORIDE 0.9% FLUSH
10.0000 mL | INTRAVENOUS | Status: DC | PRN
  Filled 2015-07-03: qty 10

## 2015-07-03 MED ORDER — HEPARIN SOD (PORK) LOCK FLUSH 100 UNIT/ML IV SOLN: 500.0000 [IU] | Freq: Once | INTRAVENOUS | Status: DC

## 2015-07-03 NOTE — Progress Notes (Signed)
Patient scheduled today for

## 2015-07-03 NOTE — Progress Notes (Signed)
Survivorship Care Plan visit completed. Treatment summary reviewed and given to patient.  ASCO answers booklet reviewed and given to patient.  CARE program and Cancer Transitions discussed along with other resources provided by the cancer center.  Patient verbalized understanding.

## 2015-07-06 ENCOUNTER — Inpatient Hospital Stay: Payer: 59

## 2015-07-06 ENCOUNTER — Inpatient Hospital Stay (HOSPITAL_BASED_OUTPATIENT_CLINIC_OR_DEPARTMENT_OTHER): Payer: 59 | Admitting: Internal Medicine

## 2015-07-06 VITALS — BP 123/68 | HR 81 | Temp 96.8°F | Resp 18 | Wt 183.4 lb

## 2015-07-06 DIAGNOSIS — C50211 Malignant neoplasm of upper-inner quadrant of right female breast: Secondary | ICD-10-CM

## 2015-07-06 DIAGNOSIS — Z79899 Other long term (current) drug therapy: Secondary | ICD-10-CM | POA: Diagnosis not present

## 2015-07-06 DIAGNOSIS — C50912 Malignant neoplasm of unspecified site of left female breast: Secondary | ICD-10-CM

## 2015-07-06 DIAGNOSIS — G629 Polyneuropathy, unspecified: Secondary | ICD-10-CM | POA: Diagnosis not present

## 2015-07-06 DIAGNOSIS — Z853 Personal history of malignant neoplasm of breast: Secondary | ICD-10-CM | POA: Diagnosis not present

## 2015-07-06 DIAGNOSIS — Z171 Estrogen receptor negative status [ER-]: Secondary | ICD-10-CM

## 2015-07-06 DIAGNOSIS — Z9221 Personal history of antineoplastic chemotherapy: Secondary | ICD-10-CM | POA: Diagnosis not present

## 2015-07-06 DIAGNOSIS — D649 Anemia, unspecified: Secondary | ICD-10-CM | POA: Diagnosis not present

## 2015-07-06 DIAGNOSIS — C50911 Malignant neoplasm of unspecified site of right female breast: Secondary | ICD-10-CM

## 2015-07-06 DIAGNOSIS — Z7982 Long term (current) use of aspirin: Secondary | ICD-10-CM | POA: Diagnosis not present

## 2015-07-06 DIAGNOSIS — E876 Hypokalemia: Secondary | ICD-10-CM | POA: Diagnosis not present

## 2015-07-06 DIAGNOSIS — I10 Essential (primary) hypertension: Secondary | ICD-10-CM | POA: Diagnosis not present

## 2015-07-06 DIAGNOSIS — K219 Gastro-esophageal reflux disease without esophagitis: Secondary | ICD-10-CM | POA: Diagnosis not present

## 2015-07-06 LAB — CBC WITH DIFFERENTIAL/PLATELET
BASOS ABS: 0 10*3/uL (ref 0–0.1)
Basophils Relative: 1 %
EOS PCT: 2 %
Eosinophils Absolute: 0.1 10*3/uL (ref 0–0.7)
HCT: 29 % — ABNORMAL LOW (ref 35.0–47.0)
Hemoglobin: 10 g/dL — ABNORMAL LOW (ref 12.0–16.0)
LYMPHS PCT: 16 %
Lymphs Abs: 1.1 10*3/uL (ref 1.0–3.6)
MCH: 33.3 pg (ref 26.0–34.0)
MCHC: 34.6 g/dL (ref 32.0–36.0)
MCV: 96.4 fL (ref 80.0–100.0)
MONO ABS: 0.6 10*3/uL (ref 0.2–0.9)
MONOS PCT: 8 %
Neutro Abs: 5.1 10*3/uL (ref 1.4–6.5)
Neutrophils Relative %: 73 %
Platelets: 196 10*3/uL (ref 150–440)
RBC: 3 MIL/uL — ABNORMAL LOW (ref 3.80–5.20)
RDW: 14.9 % — AB (ref 11.5–14.5)
WBC: 6.9 10*3/uL (ref 3.6–11.0)

## 2015-07-06 LAB — BASIC METABOLIC PANEL
ANION GAP: 6 (ref 5–15)
BUN: 21 mg/dL — AB (ref 6–20)
CALCIUM: 9.5 mg/dL (ref 8.9–10.3)
CO2: 27 mmol/L (ref 22–32)
CREATININE: 1 mg/dL (ref 0.44–1.00)
Chloride: 107 mmol/L (ref 101–111)
GFR calc Af Amer: >60 mL/min (ref >60)
GLUCOSE: 142 mg/dL — AB (ref 65–99)
Potassium: 3.9 mmol/L (ref 3.5–5.1)
Sodium: 140 mmol/L (ref 135–145)

## 2015-07-06 MED ORDER — HEPARIN SOD (PORK) LOCK FLUSH 100 UNIT/ML IV SOLN
INTRAVENOUS | Status: AC
  Filled 2015-07-06: qty 5

## 2015-07-06 MED ORDER — HEPARIN SOD (PORK) LOCK FLUSH 100 UNIT/ML IV SOLN
500.0000 [IU] | Freq: Once | INTRAVENOUS | Status: AC
  Administered 2015-07-06: 500 [IU] via INTRAVENOUS

## 2015-07-06 MED ORDER — SODIUM CHLORIDE 0.9% FLUSH
10.0000 mL | Freq: Once | INTRAVENOUS | Status: AC
Start: 2015-07-06 — End: 2015-07-06
  Administered 2015-07-06: 10 mL via INTRAVENOUS
  Filled 2015-07-06: qty 10

## 2015-07-06 MED ORDER — SODIUM CHLORIDE 0.9% FLUSH
10.0000 mL | Freq: Once | INTRAVENOUS | Status: AC
  Administered 2015-07-06: 10 mL via INTRAVENOUS
  Filled 2015-07-06: qty 10

## 2015-07-06 NOTE — Progress Notes (Signed)
[name] OFFICE PROGRESS NOTE  Patient Care Team: Guadalupe Maple, MD as PCP - General (Unknown Physician Specialty) Robert Bellow, MD (General Surgery) Guadalupe Maple, MD (Family Medicine)   SUMMARY OF ONCOLOGIC HISTORY:  # MAY 2016- RIGHT BREAST CANCER- Triple negative [T=0.5cm;G-3; margins-Neg]; Mammaprint- 0.814/molecular basal type [5 year risk- 22%; 10 year risk-29%] AC q3 W- taxol q w x 8/ 12 [sec to PN; finished Feb 2017].   # 2012 LEFT BREAST CA STAGE I s/p Lumpec [T1 (0.7CM);Triple Neg; G-3]; s/p mammosite RT; No adj chemo  #  Mild Anemia/M-protein 0.8gm/dl [sep 2016]  INTERVAL HISTORY:  A pleasant 55 year old female patient with above history of stage I right-sided triple negative breast cancer currently on adjuvant chemotherapy status post 8 treatments of weekly Taxol.  Patient continues to have tingling and numbness of her lower extremities. Neuropathy of the upper extremity is improved- she is not clumsy at this time. Patient has difficulty getting up the stairs; she is very cautious of moving around. No falls. She is taking Neurontin 303 times a day.  Patient denies any significant muscle pain/arthralgias.  Continues to have mild-moderate fatigue- improving. Otherwise no nausea no vomiting. Otherwise no chest pain shortness of breath or cough.  REVIEW OF SYSTEMS:  A complete 10 point review of system is done which is negative except mentioned above/history of present illness.   PAST MEDICAL HISTORY :  Past Medical History  Diagnosis Date  . Seasonal allergies   . Unspecified essential hypertension   . Asthma   . 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 Dry eyes 2012  . Breast screening, unspecified   . Lump or mass in breast   . Special screening for  malignant neoplasms, colon   . Obesity, unspecified   . Screening for obesity   . Breast cyst 2010    cyst- drained  . Constipation   . Kidney stone   . GERD (gastroesophageal reflux disease)   . Anemia   . Malignant neoplasm of breast (female), unspecified site 07/08/2014    Right breast, 5 mm, T1a,N0, triple negative; high risk for recurrence on Mammoprint.  . Malignant neoplasm of upper-outer quadrant of female breast ([place]) 05/08/2010    Left breast: T1b, N0, M); triple negative, DCIS present. No chemotherapy. MammoSite radiation.  . Breast cancer ([place])   . Hematuria   . Pancreatic lesion     PAST SURGICAL HISTORY :   Past Surgical History  Procedure Laterality Date  . Cesarean section  1988  . Colonoscopy  2011    Dr. Dionne Milo  . Breast mammosite  April 2012    mammosite placement and removal   . Kidney stone surgery  2003    stones removed by Dr. Quillian Quince- stent placed/removed  . Tubal ligation    . Breast lumpectomy with axillary lymph node biopsy Right 08/01/2014    Procedure: BREAST LUMPECTOMY WITH AXILLARY LYMPH NODE BIOPSY;  Surgeon: Robert Bellow, MD;  Location: [facility] ORS;  Service: General;  Laterality: Right;  . Sentinel node biopsy Right 08/01/2014    Procedure: SENTINEL NODE BIOPSY;  Surgeon: Robert Bellow, MD;  Location: [facility] ORS;  Service: General;  Laterality: Right;  . Breast surgery Left 2012    left breast wide excision, sentinel node, MammoSite  .  Breast cyst aspiration  2010  . Breast surgery Right 08/06/2014    Wide excision/sentinel node biopsy, T1a, N0 triple negative, MammoSite  . Portacath placement Left 11/09/2014    Procedure: INSERTION PORT-A-CATH;  Surgeon: Robert Bellow, MD;  Location: [facility] ORS;  Service: General;  Laterality: Left;    FAMILY HISTORY :   Family History  Problem Relation Age of Onset  . Ovarian cancer Mother   . Diabetes Mother   . Cancer Father     prostate  . Diabetes Father   . Breast cancer Maternal Aunt 60     SOCIAL HISTORY:   Social History  Substance Use Topics  . Smoking status: Never Smoker   . Smokeless tobacco: Never Used  . Alcohol Use: Yes     Comment: occasionally    ALLERGIES:  is allergic to shellfish allergy and tape.  MEDICATIONS:  Current Outpatient Prescriptions  Medication Sig Dispense Refill  . albuterol (PROVENTIL HFA;VENTOLIN HFA) 108 (90 BASE) MCG/ACT inhaler Inhale 2 puffs into the lungs every 6 (six) hours as needed for wheezing or shortness of breath.    Marland Kitchen aspirin 81 MG tablet Take 81 mg by mouth daily.    . bisacodyl (DULCOLAX) 5 MG EC tablet Take 5 mg by mouth daily as needed for moderate constipation.    . Cholecalciferol (VITAMIN D3) 1000 UNITS CAPS Take 1 capsule by mouth daily.     Marland Kitchen esomeprazole (NEXIUM) 40 MG capsule Take 40 mg by mouth 2 (two) times daily as needed (acid reflux).     . gabapentin (NEURONTIN) 300 MG capsule Take 2 capsules (600 mg total) by mouth 3 (three) times daily. 540 capsule 2  . lidocaine-prilocaine (EMLA) cream Apply cream 1 hour before chemotherapy treatment, place saran wrap over the cream to avoid getting on clothes. 30 g 1  . lisinopril (ZESTRIL) 10 MG tablet Take 1 tablet (10 mg total) by mouth daily. 60 tablet 3  . loratadine (CLARITIN) 10 MG tablet Take 10 mg by mouth daily. Reported on 04/13/2015    . polyethylene glycol (MIRALAX / GLYCOLAX) packet Take 17 g by mouth every other day.    . potassium chloride SA (K-DUR,KLOR-CON) 20 MEQ tablet Take 2 tablets (40 mEq total) by mouth 2 (two) times daily. X 2 weeks. 60 tablet 0  . promethazine (PHENERGAN) 25 MG tablet Take 1 tablet (25 mg total) by mouth every 6 (six) hours as needed for nausea or vomiting. 60 tablet 2   No current facility-administered medications for this visit.   Facility-Administered Medications Ordered in Other Visits  Medication Dose Route Frequency Provider Last Rate Last Dose  . sodium chloride 0.9 % injection 10 mL  10 mL Intravenous PRN Adrienne Alf,  MD   10 mL at 11/10/14 1138    PHYSICAL EXAMINATION: ECOG PERFORMANCE STATUS: 1 - Symptomatic but completely ambulatory  BP 123/68 mmHg  Pulse 81  Temp(Src) 96.8 F (36 C) (Tympanic)  Resp 18  Wt 183 lb 6.8 oz (83.2 kg)  Filed Weights   07/06/15 0942  Weight: 183 lb 6.8 oz (83.2 kg)    GENERAL: Well-nourished well-developed; Alert, no distress and comfortable.   She is alone.  EYES: no pallor or icterus OROPHARYNX: no thrush or ulceration; good dentition  NECK: supple, no masses felt LYMPH:  no palpable lymphadenopathy in the cervical, axillary or inguinal regions LUNGS: clear to auscultation and  No wheeze or crackles HEART/CVS: regular rate & rhythm and no murmurs; No lower extremity edema ABDOMEN:abdomen  soft, non-tender and normal bowel sounds Musculoskeletal:no cyanosis of digits and no clubbing  PSYCH: alert & oriented x 3 with fluent speech NEURO: no focal motor/sensory deficits SKIN:  no rashes or significant lesions; Mediport in place no erythema and no discharge no tenderness.  LABORATORY DATA:  I have reviewed the data as listed    Component Value Date/Time   NA 140 07/06/2015 0922   NA 140 07/16/2011 2130   K 3.9 07/06/2015 0922   K 3.7 07/16/2011 2130   CL 107 07/06/2015 0922   CL 106 07/16/2011 2130   CO2 27 07/06/2015 0922   CO2 27 07/16/2011 2130   GLUCOSE 142* 07/06/2015 0922   GLUCOSE 93 07/16/2011 2130   BUN 21* 07/06/2015 0922   BUN 16 07/16/2011 2130   CREATININE 1.00 07/06/2015 0922   CREATININE 0.95 07/16/2011 2130   CALCIUM 9.5 07/06/2015 0922   CALCIUM 9.0 07/16/2011 2130   PROT 7.9 05/25/2015 0905   ALBUMIN 4.1 05/25/2015 0905   AST 32 05/25/2015 0905   ALT 27 05/25/2015 0905   ALKPHOS 56 05/25/2015 0905   BILITOT 1.0 05/25/2015 0905   GFRNONAA >60 07/06/2015 0922   GFRNONAA >60 07/16/2011 2130   GFRAA >60 07/06/2015 0922   GFRAA >60 07/16/2011 2130    No results found for: SPEP, UPEP  Lab Results  Component Value Date   WBC  6.9 07/06/2015   NEUTROABS 5.1 07/06/2015   HGB 10.0* 07/06/2015   HCT 29.0* 07/06/2015   MCV 96.4 07/06/2015   PLT 196 07/06/2015      Chemistry      Component Value Date/Time   NA 140 07/06/2015 0922   NA 140 07/16/2011 2130   K 3.9 07/06/2015 0922   K 3.7 07/16/2011 2130   CL 107 07/06/2015 0922   CL 106 07/16/2011 2130   CO2 27 07/06/2015 0922   CO2 27 07/16/2011 2130   BUN 21* 07/06/2015 0922   BUN 16 07/16/2011 2130   CREATININE 1.00 07/06/2015 0922   CREATININE 0.95 07/16/2011 2130      Component Value Date/Time   CALCIUM 9.5 07/06/2015 0922   CALCIUM 9.0 07/16/2011 2130   ALKPHOS 56 05/25/2015 0905   AST 32 05/25/2015 0905   ALT 27 05/25/2015 0905   BILITOT 1.0 05/25/2015 0905         ASSESSMENT & PLAN:   # RIGHT BREAST CANCER STAGE I - Triple negative s/p  on adjuvant AC x 4 followed byTaxol weekly times 8 [stopped sec to PN in jan 2017]. No concerns for recurrence. Recent mammogram negative.   # Peripheral neuropathy grade 2- Not better. on Neurontin 300 milligrams 3 times a day .Recommend going up to 600 TID.   #  Severe anemia needing blood transfusion- currently improving Today hemoglobin is  10.   #  Severe hypokalemia-  On oral supplementation. Continue potassium supplementation twice a day.  #  Will review genetic counseling at next visit.  # Port flush in 6 weeks labs; see me in 3 months port flush and labs.  # 15 minutes face-to-face with the patient discussing the above plan of care; more than 50% of time spent on prognosis/ natural history; counseling and coordination.     Cammie Sickle, MD 07/06/2015 10:01 AM

## 2015-07-06 NOTE — Progress Notes (Signed)
Patient states her toes feel numb.  She has noticed changes in her feet.  States her toes are fat and do not bend well.  Wants MD to look at her feet today.  Also needs renewal on Handicapped placard for her car.

## 2015-07-10 ENCOUNTER — Ambulatory Visit (INDEPENDENT_AMBULATORY_CARE_PROVIDER_SITE_OTHER): Payer: 59 | Admitting: General Surgery

## 2015-07-10 ENCOUNTER — Encounter: Payer: Self-pay | Admitting: General Surgery

## 2015-07-10 VITALS — BP 122/72 | HR 86 | Resp 14 | Ht 65.0 in | Wt 182.0 lb

## 2015-07-10 DIAGNOSIS — C50911 Malignant neoplasm of unspecified site of right female breast: Secondary | ICD-10-CM

## 2015-07-10 NOTE — Progress Notes (Addendum)
Patient ID: Adrienne [name], female   DOB: 30-Jan-1961, 55 y.o.   MRN: [id number]  Chief Complaint  Patient presents with  . Follow-up    Mammogram    HPI Adrienne [name] is a 55 y.o. female who presents for a breast evaluation. The most recent mammogram was done on 06/28/15.  Patient does perform regular self breast checks and gets regular mammograms done.  No new breast issues. She stopped chemotherapy treatments in February 2017. She is scheduled to see Dr. Rogue Bussing in July 2017 for routine follow-up. She is anxious to have her port removed.   I personally reviewed the patient's history.  HPI  Past Medical History  Diagnosis Date  . Seasonal allergies   . Unspecified essential hypertension   . Asthma   . 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 Dry eyes 2012  . Breast screening, unspecified   . Lump or mass in breast   . Special screening for malignant neoplasms, colon   . Obesity, unspecified   . Screening for obesity   . Breast cyst 2010    cyst- drained  . Constipation   . Kidney stone   . GERD (gastroesophageal reflux disease)   . Anemia   . Malignant neoplasm of breast (female), unspecified site 07/08/2014    Right breast, 5 mm, T1a,N0, triple negative; high risk for recurrence on Mammoprint.  . 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 No chemotherapy. MammoSite radiation.  . Breast cancer ([place])   . Hematuria   . Pancreatic lesion     Past Surgical History  Procedure Laterality Date  . Cesarean section  1988  . Colonoscopy  October 17, 2009    Dr. Dionne Milo  . Breast mammosite  April 2012    mammosite placement and removal   . Kidney stone surgery  2003    stones removed by Dr.  Quillian Quince- stent placed/removed  . Tubal ligation    . Breast lumpectomy with axillary lymph node biopsy Right 08/01/2014    Procedure: BREAST LUMPECTOMY WITH AXILLARY LYMPH NODE BIOPSY;  Surgeon: Robert Bellow, MD;  Location: [facility];  Service: General;  Laterality: Right;  . Sentinel node biopsy Right 08/01/2014    Procedure: SENTINEL NODE BIOPSY;  Surgeon: Robert Bellow, MD;  Location: [facility];  Service: General;  Laterality: Right;  . Breast surgery Left 2012    left breast wide excision, sentinel node, MammoSite  . Breast cyst aspiration  2010  . Breast surgery Right 08/06/2014    Wide excision/sentinel node biopsy, T1a, N0 triple negative, MammoSite  . Portacath placement Left 11/09/2014    Procedure: INSERTION PORT-A-CATH;  Surgeon: Robert Bellow, MD;  Location: [facility]S;  Service: General;  Laterality: Left;    Family History  Problem Relation Age of Onset  . Ovarian cancer Mother   . Diabetes Mother   . Cancer Father     prostate  . Diabetes Father   . Breast cancer Maternal Aunt 60    Social History Social History  Substance Use Topics  . Smoking status: Never Smoker   . Smokeless tobacco: Never Used  . Alcohol Use: Yes     Comment: occasionally    Allergies  Allergen Reactions  . Shellfish Allergy Other (See Comments)    Hives and swelling on ears and feet  . Tape Rash    Surgical tape from breast biopsy    Current Outpatient Prescriptions  Medication Sig Dispense Refill  . albuterol (PROVENTIL HFA;VENTOLIN HFA) 108 (90 BASE) MCG/ACT inhaler Inhale 2 puffs into the lungs every 6 (six) hours as needed for wheezing or shortness of breath.    Marland Kitchen aspirin 81 MG tablet Take 81 mg by mouth daily.    . bisacodyl (DULCOLAX) 5 MG EC tablet Take 5 mg by mouth daily as needed for moderate constipation.    . Cholecalciferol (VITAMIN D3) 1000 UNITS CAPS Take 1 capsule by mouth daily.     Marland Kitchen esomeprazole (NEXIUM) 40 MG capsule Take 40 mg by mouth 2 (two) times daily  as needed (acid reflux).     . gabapentin (NEURONTIN) 300 MG capsule Take 2 capsules (600 mg total) by mouth 3 (three) times daily. 540 capsule 2  . lidocaine-prilocaine (EMLA) cream Apply cream 1 hour before chemotherapy treatment, place saran wrap over the cream to avoid getting on clothes. 30 g 1  . lisinopril (ZESTRIL) 10 MG tablet Take 1 tablet (10 mg total) by mouth daily. 60 tablet 3  . loratadine (CLARITIN) 10 MG tablet Take 10 mg by mouth daily. Reported on 04/13/2015    . polyethylene glycol (MIRALAX / GLYCOLAX) packet Take 17 g by mouth every other day.    . potassium chloride SA (K-DUR,KLOR-CON) 20 MEQ tablet Take 2 tablets (40 mEq total) by mouth 2 (two) times daily. X 2 weeks. 60 tablet 0  . promethazine (PHENERGAN) 25 MG tablet Take 1 tablet (25 mg total) by mouth every 6 (six) hours as needed for nausea or vomiting. 60 tablet 2   No current facility-administered medications for this visit.   Facility-Administered Medications Ordered in Other Visits  Medication Dose Route Frequency Provider Last Rate Last Dose  . sodium chloride 0.9 % injection 10 mL  10 mL Intravenous PRN Leia Alf, MD   10 mL at 11/10/14 1138    Review of Systems Review of Systems  Constitutional: Negative.   Respiratory: Negative.   Cardiovascular: Negative.     Blood pressure 122/72, pulse 86, resp. rate 14, height 5\' 5"  (1.651 m), weight 182 lb (82.555 kg).  Physical Exam Physical Exam  Constitutional: She is oriented to person, place, and time. She appears well-developed and well-nourished.  Eyes: Conjunctivae are normal. No scleral icterus.  Neck: Neck supple. No thyromegaly present.  Cardiovascular: Normal rate, regular rhythm and normal heart sounds.   Pulmonary/Chest: Effort normal and breath sounds normal. Right breast exhibits no inverted nipple (elongated nipple), no mass, no nipple discharge, no skin change and no tenderness. Left breast exhibits no inverted nipple, no mass, no nipple  discharge, no skin change and no tenderness.    Upper extremity measurements obtained 15 cm superior to the olecranon process as well as 10 and 20 cm below the olecranon process.  Left: 33, 25, 18 cm respectively.  Right: 33.5, 26, 19 cm respectively.  No evidence of lymphedema.  Lymphadenopathy:    She has no cervical adenopathy.  Neurological: She is alert and oriented to person, place, and time.  Skin: Skin is warm and dry.    Data Reviewed August 2011 colonoscopy was reported as a normal study. No family history of colon cancer. Repeat exam in 10 years would be appropriate.  Bilateral mammograms dated 06/28/2015 were reviewed. Postsurgical  changes. BI-RADS-2.  Assessment    Benign breast exam status post bilateral cancer treatment.  Desire to have PowerPort removed.    Plan    The patient will discuss with her medical oncologist removal of the [place].  She desires to have her next colonoscopy scheduled through this office. Plan follow up in 21.   At a minimum will plan on a follow-up examination in one year with bilateral diagnostic mammograms at that time. PowerPort removal earlier if approved by medical oncology.     The patient has been asked to return to the office in one year with a bilateral diagnostic mammogram.   PCP: Dr. Jeananne Rama This information has been scribed by Verlene Mayer, CMA     Robert Bellow 07/11/2015, 2:14 PM

## 2015-07-10 NOTE — Patient Instructions (Signed)
The patient has been asked to return to the office in one year with a bilateral diagnostic mammogram. 

## 2015-07-11 ENCOUNTER — Encounter: Payer: Self-pay | Admitting: General Surgery

## 2015-07-18 ENCOUNTER — Telehealth: Payer: Self-pay | Admitting: *Deleted

## 2015-07-18 NOTE — Telephone Encounter (Signed)
Patient was notified as instructed.   An appointment has been arranged for port removal on 08-01-15 at 9 am.

## 2015-07-18 NOTE — Telephone Encounter (Signed)
-----   Message from Robert Bellow, MD sent at 07/17/2015  5:16 PM EDT ----- Please notify the patient that medical oncology has given approval for PowerPort removal. This can be scheduled at a convenient date in the office.

## 2015-08-01 ENCOUNTER — Encounter: Payer: Self-pay | Admitting: General Surgery

## 2015-08-01 ENCOUNTER — Ambulatory Visit (INDEPENDENT_AMBULATORY_CARE_PROVIDER_SITE_OTHER): Payer: 59 | Admitting: General Surgery

## 2015-08-01 VITALS — BP 102/64 | HR 80 | Resp 12 | Ht 65.0 in | Wt 185.0 lb

## 2015-08-01 DIAGNOSIS — C50911 Malignant neoplasm of unspecified site of right female breast: Secondary | ICD-10-CM | POA: Diagnosis not present

## 2015-08-01 NOTE — Patient Instructions (Signed)
Keep area clean May use ice pack on/off for today May shower

## 2015-08-01 NOTE — Progress Notes (Signed)
Patient ID: Adrienne [name], female   DOB: 1960-12-07, 55 y.o.   MRN: [id number]  Chief Complaint  Patient presents with  . Procedure    Port removal    HPI Adrienne [name] is a 55 y.o. female here today for a port removal.Consent for the procedure was obtained from medical oncology and from the patient. The procedure was reviewed. She was accompanied by her husband. HPI  Past Medical History  Diagnosis Date  . Seasonal allergies   . Unspecified essential hypertension   . Asthma   . 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 Dry eyes 2012  . Breast screening, unspecified   . Lump or mass in breast   . Special screening for malignant neoplasms, colon   . Obesity, unspecified   . Screening for obesity   . Breast cyst 2010    cyst- drained  . Constipation   . Kidney stone   . GERD (gastroesophageal reflux disease)   . Anemia   . Malignant neoplasm of breast (female), unspecified site 07/08/2014    Right breast, 5 mm, T1a,N0, triple negative; high risk for recurrence on Mammoprint.  . Malignant neoplasm of upper-outer quadrant of female breast ([place]) 05/08/2010    Left breast: T1b, N0, M); triple negative, DCIS present. No chemotherapy. MammoSite radiation.  . Breast cancer ([place])   . Hematuria   . Pancreatic lesion     Past Surgical History  Procedure Laterality Date  . Cesarean section  1988  . Colonoscopy  October 17, 2009    Dr. Dionne Milo, normal study.  . Breast mammosite  April 2012    mammosite placement and removal   . Kidney stone surgery  2003    stones removed by Dr. Quillian Quince- stent placed/removed  . Tubal ligation    . Breast lumpectomy with axillary lymph node biopsy Right 08/01/2014    Procedure: BREAST LUMPECTOMY WITH AXILLARY LYMPH NODE BIOPSY;  Surgeon: Robert Bellow, MD;   Location: [facility];  Service: General;  Laterality: Right;  . Sentinel node biopsy Right 08/01/2014    Procedure: SENTINEL NODE BIOPSY;  Surgeon: Robert Bellow, MD;  Location: [facility];  Service: General;  Laterality: Right;  . Breast surgery Left 2012    left breast wide excision, sentinel node, MammoSite  . Breast cyst aspiration  2010  . Breast surgery Right 08/06/2014    Wide excision/sentinel node biopsy, T1a, N0 triple negative, MammoSite  . Portacath placement Left 11/09/2014    Procedure: INSERTION PORT-A-CATH;  Surgeon: Robert Bellow, MD;  Location: [facility];  Service: General;  Laterality: Left;    Family History  Problem Relation Age of Onset  . Ovarian cancer Mother   . Diabetes Mother   . Cancer Father     prostate  . Diabetes Father   . Breast cancer Maternal Aunt 60    Social History Social History  Substance Use Topics  . Smoking status: Never Smoker   . Smokeless tobacco: Never Used  . Alcohol Use: Yes     Comment: occasionally    Allergies  Allergen Reactions  . Shellfish Allergy Other (See Comments)    Hives and swelling on ears and feet  . Tape Rash    Surgical tape from breast biopsy    Current Outpatient Prescriptions  Medication Sig Dispense Refill  . albuterol (PROVENTIL HFA;VENTOLIN HFA) 108 (90 BASE) MCG/ACT inhaler Inhale 2 puffs into the lungs every 6 (six) hours as needed for wheezing or shortness of breath.    Marland Kitchen aspirin 81 MG tablet Take 81 mg by mouth daily.    . bisacodyl (DULCOLAX) 5 MG EC tablet Take 5 mg by mouth daily as needed for moderate constipation.    . Cholecalciferol (VITAMIN D3) 1000 UNITS CAPS Take 1 capsule by mouth daily.     Marland Kitchen esomeprazole (NEXIUM) 40 MG capsule Take 40 mg by mouth 2 (two) times daily as needed (acid reflux).     . gabapentin (NEURONTIN) 300 MG capsule Take 2 capsules (600 mg total) by mouth 3 (three) times daily. 540 capsule 2  . lidocaine-prilocaine (EMLA) cream Apply cream 1 hour before  chemotherapy treatment, place saran wrap over the cream to avoid getting on clothes. 30 g 1  . lisinopril (ZESTRIL) 10 MG tablet Take 1 tablet (10 mg total) by mouth daily. 60 tablet 3  . loratadine (CLARITIN) 10 MG tablet Take 10 mg by mouth daily. Reported on 04/13/2015    . polyethylene glycol (MIRALAX / GLYCOLAX) packet Take 17 g by mouth every other day.    . potassium chloride SA (K-DUR,KLOR-CON) 20 MEQ tablet Take 2 tablets (40 mEq total) by mouth 2 (two) times daily. X 2 weeks. 60 tablet 0  . promethazine (PHENERGAN) 25 MG tablet Take 1 tablet (25 mg total) by mouth every 6 (six) hours as needed for nausea or vomiting. 60 tablet 2   No current facility-administered medications for this visit.   Facility-Administered Medications Ordered in Other Visits  Medication Dose Route Frequency Provider Last Rate Last Dose  . sodium chloride 0.9 % injection 10 mL  10 mL Intravenous PRN Leia Alf, MD   10 mL at 11/10/14 1138    Review of Systems Review of Systems  Constitutional: Negative.   Respiratory: Negative.   Cardiovascular: Negative.     Blood pressure 102/64, pulse 80, resp. rate 12, height 5\' 5"  (1.651 m), weight 185 lb (83.915 kg).  Physical Exam Physical Exam  Constitutional: She is oriented to person, place, and time. She appears well-developed and well-nourished.  Pulmonary/Chest:    Neurological: She is alert and oriented to person, place, and time.  Skin: Skin is warm.    Data Reviewed Medical oncology consent.  Assessment    No further need for central venous access.    Plan    The skin was cleansed with ChloraPrep followed by 10 mL of 0.5% Xylocaine with 0.25% Marcaine with 1-200,000 epinephrine. This was well tolerated. ChloraPrep was again applied to the skin. The original incision was opened sharply. The port was dissected free. Transfixion sutures were removed. The port and catheter were  removed intact. The wound was closed in layers with 3-0 Vicryl  running suture to the adipose layer and a running 3-0 Vicryl subcuticular suture for the skin. Benzoin, Steri-Strips, Telfa and Tegaderm dressing applied.  Ice pack provided. Postoperative wound care reviewed.  The patient will be asked to return in one week for nursing assessment. Further follow-up based on her regular screening exams in this office.    PCP:  [facility] A This information has been scribed by Gaspar Cola CMA.    Robert Bellow 08/02/2015, 1:43 PM

## 2015-08-07 ENCOUNTER — Ambulatory Visit (INDEPENDENT_AMBULATORY_CARE_PROVIDER_SITE_OTHER): Payer: 59 | Admitting: *Deleted

## 2015-08-07 DIAGNOSIS — C50911 Malignant neoplasm of unspecified site of right female breast: Secondary | ICD-10-CM

## 2015-08-07 NOTE — Patient Instructions (Signed)
The patient is aware to call back for any questions or concerns.  

## 2015-08-07 NOTE — Progress Notes (Signed)
Patient came in today for a wound check post port removal.  Steri strips intact.The wound is clean, with no signs of infection noted. Follow up as scheduled.

## 2015-08-27 ENCOUNTER — Ambulatory Visit (INDEPENDENT_AMBULATORY_CARE_PROVIDER_SITE_OTHER): Payer: 59 | Admitting: Unknown Physician Specialty

## 2015-08-27 ENCOUNTER — Encounter: Payer: Self-pay | Admitting: Unknown Physician Specialty

## 2015-08-27 VITALS — BP 138/84 | HR 80 | Temp 98.1°F | Ht 65.0 in | Wt 183.4 lb

## 2015-08-27 DIAGNOSIS — R7301 Impaired fasting glucose: Secondary | ICD-10-CM | POA: Diagnosis not present

## 2015-08-27 DIAGNOSIS — Z0000 Encounter for general adult medical examination without abnormal findings: Secondary | ICD-10-CM

## 2015-08-27 LAB — BAYER DCA HB A1C WAIVED: HB A1C: 5.9 % (ref <7.0)

## 2015-08-27 NOTE — Progress Notes (Signed)
BP 138/84 mmHg  Pulse 80  Temp(Src) 98.1 F (36.7 C)  Ht 5\' 5"  (1.651 m)  Wt 183 lb 6.4 oz (83.19 kg)  BMI 30.52 kg/m2  SpO2 99%  LMP  (LMP Unknown)   Subjective:    Patient ID: Adrienne [name], female    DOB: 1961/02/15, 55 y.o.   MRN: [id number]  HPI: Adrienne [name] is a 55 y.o. female  Chief Complaint  Patient presents with  . Annual Exam    Hep C and HIV orders entered   Hypertension Using medications without difficulty Average home BPs 120/79   No problems or lightheadedness No chest pain with exertion or shortness of breath No Edema  Past Surgical History  Procedure Laterality Date  . Cesarean section  1988  . Colonoscopy  October 17, 2009    Dr. Dionne Milo, normal study.  . Breast mammosite  April 2012    mammosite placement and removal   . Kidney stone surgery  2003    stones removed by Dr. Quillian Quince- stent placed/removed  . Tubal ligation    . Breast lumpectomy with axillary lymph node biopsy Right 08/01/2014    Procedure: BREAST LUMPECTOMY WITH AXILLARY LYMPH NODE BIOPSY;  Surgeon: Robert Bellow, MD;  Location: [facility];  Service: General;  Laterality: Right;  . Sentinel node biopsy Right 08/01/2014    Procedure: SENTINEL NODE BIOPSY;  Surgeon: Robert Bellow, MD;  Location: [facility];  Service: General;  Laterality: Right;  . Breast surgery Left 2012    left breast wide excision, sentinel node, MammoSite  . Breast cyst aspiration  2010  . Breast surgery Right 08/06/2014    Wide excision/sentinel node biopsy, T1a, N0 triple negative, MammoSite  . Portacath placement Left 11/09/2014    Procedure: INSERTION PORT-A-CATH;  Surgeon: Robert Bellow, MD;  Location: [facility];  Service: General;  Laterality: Left;   Social History   Social History  . Marital Status: Single    Spouse Name: N/A  . Number of Children: N/A  . Years of Education: N/A   Occupational History  . Not on file.   Social History Main Topics  . Smoking status: Never Smoker   .  Smokeless tobacco: Never Used  . Alcohol Use: Yes     Comment: occasionally  . Drug Use: No  . Sexual Activity: Yes   Other Topics Concern  . Not on file   Social History Narrative   Family History  Problem Relation Age of Onset  . Ovarian cancer Mother   . Diabetes Mother   . Cancer Father     prostate  . Diabetes Father   . Breast cancer Maternal Aunt 60  . Hypertension Brother   . Cancer Maternal Grandmother     bone  . Cancer Maternal Grandfather     lung  . Cancer Paternal Grandmother     lung  . Asthma Paternal Grandfather   . Hypertension Sister      Relevant past medical, surgical, family and social history reviewed and updated as indicated. Interim medical history since our last visit reviewed. Allergies and medications reviewed and updated.  Review of Systems  Constitutional: Negative.   HENT: Negative.   Respiratory: Negative.   Cardiovascular: Negative.   Gastrointestinal:       Stable GERD on Nexium  Endocrine: Negative.   Genitourinary: Negative.   Musculoskeletal: Negative.   Skin: Negative.   Allergic/Immunologic: Negative.   Neurological:       Numbness in  feet and hands from chemotherapy.  She is taking Gabapentin  Hematological: Negative.   Psychiatric/Behavioral: Negative.     Per HPI unless specifically indicated above     Objective:    BP 138/84 mmHg  Pulse 80  Temp(Src) 98.1 F (36.7 C)  Ht 5\' 5"  (1.651 m)  Wt 183 lb 6.4 oz (83.19 kg)  BMI 30.52 kg/m2  SpO2 99%  LMP  (LMP Unknown)  Wt Readings from Last 3 Encounters:  08/27/15 183 lb 6.4 oz (83.19 kg)  08/01/15 185 lb (83.915 kg)  07/10/15 182 lb (82.555 kg)    Physical Exam  Constitutional: She is oriented to person, place, and time. She appears well-developed and well-nourished.  HENT:  Head: Normocephalic and atraumatic.  Eyes: Pupils are equal, round, and reactive to light. Right eye exhibits no discharge. Left eye exhibits no discharge. No scleral icterus.  Neck:  Normal range of motion. Neck supple. Carotid bruit is not present. No thyromegaly present.  Cardiovascular: Normal rate, regular rhythm and normal heart sounds.  Exam reveals no gallop and no friction rub.   No murmur heard. Pulmonary/Chest: Effort normal and breath sounds normal. No respiratory distress. She has no wheezes. She has no rales.  Abdominal: Soft. Bowel sounds are normal. There is no tenderness. There is no rebound.  Genitourinary:  Breast exam through oncology for now  Musculoskeletal: Normal range of motion.  Lymphadenopathy:    She has no cervical adenopathy.  Neurological: She is alert and oriented to person, place, and time.  Skin: Skin is warm, dry and intact. No rash noted.  Psychiatric: She has a normal mood and affect. Her speech is normal and behavior is normal. Judgment and thought content normal. Cognition and memory are normal.    Results for orders placed or performed in visit on 07/06/15  CBC with Differential  Result Value Ref Range   WBC 6.9 3.6 - 11.0 K/uL   RBC 3.00 (L) 3.80 - 5.20 MIL/uL   Hemoglobin 10.0 (L) 12.0 - 16.0 g/dL   HCT 29.0 (L) 35.0 - 47.0 %   MCV 96.4 80.0 - 100.0 fL   MCH 33.3 26.0 - 34.0 pg   MCHC 34.6 32.0 - 36.0 g/dL   RDW 14.9 (H) 11.5 - 14.5 %   Platelets 196 150 - 440 K/uL   Neutrophils Relative % 73 %   Neutro Abs 5.1 1.4 - 6.5 K/uL   Lymphocytes Relative 16 %   Lymphs Abs 1.1 1.0 - 3.6 K/uL   Monocytes Relative 8 %   Monocytes Absolute 0.6 0.2 - 0.9 K/uL   Eosinophils Relative 2 %   Eosinophils Absolute 0.1 0 - 0.7 K/uL   Basophils Relative 1 %   Basophils Absolute 0.0 0 - 0.1 K/uL  Basic metabolic panel  Result Value Ref Range   Sodium 140 135 - 145 mmol/L   Potassium 3.9 3.5 - 5.1 mmol/L   Chloride 107 101 - 111 mmol/L   CO2 27 22 - 32 mmol/L   Glucose, Bld 142 (H) 65 - 99 mg/dL   BUN 21 (H) 6 - 20 mg/dL   Creatinine, Ser 1.00 0.44 - 1.00 mg/dL   Calcium 9.5 8.9 - 10.3 mg/dL   GFR calc non Af Amer >60 >60 mL/min    GFR calc Af Amer >60 >60 mL/min   Anion gap 6 5 - 15      Assessment & Plan:   Problem List Items Addressed This Visit    None  Visit Diagnoses    Health care maintenance    -  Primary    Relevant Orders    Hepatitis C antibody    HIV antibody    Lipid Panel w/o Chol/HDL Ratio    IFG (impaired fasting glucose)        Relevant Orders    Bayer DCA Hb A1c Waived        Follow up plan: Return in about 1 year (around 08/26/2016).

## 2015-08-28 ENCOUNTER — Encounter: Payer: Self-pay | Admitting: Unknown Physician Specialty

## 2015-08-28 LAB — HIV ANTIBODY (ROUTINE TESTING W REFLEX): HIV SCREEN 4TH GENERATION: NONREACTIVE

## 2015-08-28 LAB — LIPID PANEL W/O CHOL/HDL RATIO
CHOLESTEROL TOTAL: 183 mg/dL (ref 100–199)
HDL: 68 mg/dL (ref >39)
LDL Calculated: 106 mg/dL — ABNORMAL HIGH (ref 0–99)
TRIGLYCERIDES: 44 mg/dL (ref 0–149)
VLDL Cholesterol Cal: 9 mg/dL (ref 5–40)

## 2015-08-28 LAB — HEPATITIS C ANTIBODY

## 2015-08-30 ENCOUNTER — Other Ambulatory Visit: Payer: Self-pay | Admitting: *Deleted

## 2015-08-30 DIAGNOSIS — I10 Essential (primary) hypertension: Secondary | ICD-10-CM

## 2015-08-30 MED ORDER — LISINOPRIL 10 MG PO TABS: 10.0000 mg | ORAL_TABLET | Freq: Every day | ORAL | Status: DC

## 2015-08-30 NOTE — Progress Notes (Signed)
rcvd msg from [facility] for RF-lisinopril 90 day supply. This rx was renewed per v/o Dr. Rogue Bussing

## 2015-09-26 ENCOUNTER — Inpatient Hospital Stay: Payer: 59

## 2015-09-26 ENCOUNTER — Inpatient Hospital Stay: Payer: 59 | Attending: Internal Medicine | Admitting: Internal Medicine

## 2015-09-26 VITALS — BP 125/84 | HR 82 | Temp 96.5°F | Resp 18 | Wt 188.0 lb

## 2015-09-26 DIAGNOSIS — M7989 Other specified soft tissue disorders: Secondary | ICD-10-CM | POA: Diagnosis not present

## 2015-09-26 DIAGNOSIS — K219 Gastro-esophageal reflux disease without esophagitis: Secondary | ICD-10-CM | POA: Diagnosis not present

## 2015-09-26 DIAGNOSIS — K869 Disease of pancreas, unspecified: Secondary | ICD-10-CM

## 2015-09-26 DIAGNOSIS — G629 Polyneuropathy, unspecified: Secondary | ICD-10-CM | POA: Insufficient documentation

## 2015-09-26 DIAGNOSIS — M79662 Pain in left lower leg: Secondary | ICD-10-CM | POA: Insufficient documentation

## 2015-09-26 DIAGNOSIS — C50811 Malignant neoplasm of overlapping sites of right female breast: Secondary | ICD-10-CM

## 2015-09-26 DIAGNOSIS — R202 Paresthesia of skin: Secondary | ICD-10-CM | POA: Diagnosis not present

## 2015-09-26 DIAGNOSIS — R200 Anesthesia of skin: Secondary | ICD-10-CM | POA: Insufficient documentation

## 2015-09-26 DIAGNOSIS — K5900 Constipation, unspecified: Secondary | ICD-10-CM | POA: Diagnosis not present

## 2015-09-26 DIAGNOSIS — Z923 Personal history of irradiation: Secondary | ICD-10-CM | POA: Insufficient documentation

## 2015-09-26 DIAGNOSIS — E669 Obesity, unspecified: Secondary | ICD-10-CM | POA: Insufficient documentation

## 2015-09-26 DIAGNOSIS — I10 Essential (primary) hypertension: Secondary | ICD-10-CM | POA: Diagnosis not present

## 2015-09-26 DIAGNOSIS — Z801 Family history of malignant neoplasm of trachea, bronchus and lung: Secondary | ICD-10-CM | POA: Insufficient documentation

## 2015-09-26 DIAGNOSIS — Z7982 Long term (current) use of aspirin: Secondary | ICD-10-CM

## 2015-09-26 DIAGNOSIS — Z79899 Other long term (current) drug therapy: Secondary | ICD-10-CM | POA: Diagnosis not present

## 2015-09-26 DIAGNOSIS — J45909 Unspecified asthma, uncomplicated: Secondary | ICD-10-CM | POA: Insufficient documentation

## 2015-09-26 DIAGNOSIS — Z8042 Family history of malignant neoplasm of prostate: Secondary | ICD-10-CM

## 2015-09-26 DIAGNOSIS — R5383 Other fatigue: Secondary | ICD-10-CM | POA: Diagnosis not present

## 2015-09-26 DIAGNOSIS — Z87442 Personal history of urinary calculi: Secondary | ICD-10-CM | POA: Diagnosis not present

## 2015-09-26 DIAGNOSIS — Z853 Personal history of malignant neoplasm of breast: Secondary | ICD-10-CM | POA: Insufficient documentation

## 2015-09-26 DIAGNOSIS — Z808 Family history of malignant neoplasm of other organs or systems: Secondary | ICD-10-CM | POA: Diagnosis not present

## 2015-09-26 DIAGNOSIS — C50912 Malignant neoplasm of unspecified site of left female breast: Secondary | ICD-10-CM

## 2015-09-26 DIAGNOSIS — D649 Anemia, unspecified: Secondary | ICD-10-CM | POA: Diagnosis not present

## 2015-09-26 DIAGNOSIS — Z171 Estrogen receptor negative status [ER-]: Secondary | ICD-10-CM

## 2015-09-26 LAB — CBC WITH DIFFERENTIAL/PLATELET
BASOS PCT: 1 %
Basophils Absolute: 0 10*3/uL (ref 0–0.1)
EOS ABS: 0.3 10*3/uL (ref 0–0.7)
EOS PCT: 4 %
HCT: 30.3 % — ABNORMAL LOW (ref 35.0–47.0)
Hemoglobin: 10.3 g/dL — ABNORMAL LOW (ref 12.0–16.0)
LYMPHS ABS: 1.3 10*3/uL (ref 1.0–3.6)
Lymphocytes Relative: 22 %
MCH: 32.7 pg (ref 26.0–34.0)
MCHC: 34.2 g/dL (ref 32.0–36.0)
MCV: 95.5 fL (ref 80.0–100.0)
MONO ABS: 0.6 10*3/uL (ref 0.2–0.9)
MONOS PCT: 9 %
Neutro Abs: 4 10*3/uL (ref 1.4–6.5)
Neutrophils Relative %: 64 %
Platelets: 172 10*3/uL (ref 150–440)
RBC: 3.17 MIL/uL — ABNORMAL LOW (ref 3.80–5.20)
RDW: 14.8 % — AB (ref 11.5–14.5)
WBC: 6.2 10*3/uL (ref 3.6–11.0)

## 2015-09-26 LAB — COMPREHENSIVE METABOLIC PANEL
ALBUMIN: 3.9 g/dL (ref 3.5–5.0)
ALT: 16 U/L (ref 14–54)
ANION GAP: 6 (ref 5–15)
AST: 18 U/L (ref 15–41)
Alkaline Phosphatase: 89 U/L (ref 38–126)
BUN: 24 mg/dL — ABNORMAL HIGH (ref 6–20)
CALCIUM: 8.9 mg/dL (ref 8.9–10.3)
CO2: 26 mmol/L (ref 22–32)
Chloride: 107 mmol/L (ref 101–111)
Creatinine, Ser: 0.98 mg/dL (ref 0.44–1.00)
GFR calc non Af Amer: >60 mL/min (ref >60)
GLUCOSE: 89 mg/dL (ref 65–99)
POTASSIUM: 3.8 mmol/L (ref 3.5–5.1)
SODIUM: 139 mmol/L (ref 135–145)
TOTAL PROTEIN: 7.6 g/dL (ref 6.5–8.1)
Total Bilirubin: 0.4 mg/dL (ref 0.3–1.2)

## 2015-09-26 NOTE — Progress Notes (Signed)
[place] OFFICE PROGRESS NOTE  Patient Care Team: Kathrine Haddock, NP as PCP - General (Nurse Practitioner) Robert Bellow, MD (General Surgery) Cammie Sickle, MD as Consulting Physician (Internal Medicine)   SUMMARY OF ONCOLOGIC HISTORY:  Oncology History   # MAY 2016- RIGHT BREAST CANCER- Triple negative [T=0.5cm;G-3; margins-Neg]; Mammaprint- 0.814/molecular basal type [5 year risk- 22%; 10 year risk-29%] AC q3 W- taxol q w x 8/ 12 [sec to PN; finished Feb 2017].   # 2012 LEFT BREAST CA STAGE I s/p Lumpec [T1 (0.7CM);Triple Neg; G-3]; s/p mammosite RT; No adj chemo  #  Mild Anemia/M-protein 0.8gm/dl [sep 2016]      Cancer of overlapping sites of right female breast ([place])   09/26/2015 Initial Diagnosis Cancer of overlapping sites of right female breast [facility])    INTERVAL HISTORY:  A pleasant 55 year old female patient with above history of stage I right-sided triple negative breast cancer currently on adjuvant chemotherapy is here for a follow up. In the interim patient had a port explanted.   Patient continues to have tingling and numbness of her lower extremities. No falls. She is taking Neurontin 300  times a day. Patient noted mild swelling of the left lower extremity compared to the right. Denies any significant cramps.  Patient denies any significant muscle pain/arthralgias.  Continues to have mild-moderate fatigue- improving. Otherwise no nausea no vomiting. Otherwise no chest pain shortness of breath or cough.  REVIEW OF SYSTEMS:  A complete 10 point review of system is done which is negative except mentioned above/history of present illness.   PAST MEDICAL HISTORY :  Past Medical History  Diagnosis Date  . Seasonal allergies   . Unspecified essential hypertension   . Asthma   . Personal history of malignant neoplasm of breast 2012    has been treated with left breast wide excision and radiation therapy; Patient has DCIS as well as a 7 mm  infiltrating mammary carcinoma triple negative removed on 05-08-10  . Dry eyes 2012  . Breast screening, unspecified   . Lump or mass in breast   . Special screening for malignant neoplasms, colon   . Obesity, unspecified   . Screening for obesity   . Breast cyst 2010    cyst- drained  . Constipation   . Kidney stone   . GERD (gastroesophageal reflux disease)   . Anemia   . Malignant neoplasm of breast (female), unspecified site 07/08/2014    Right breast, 5 mm, T1a,N0, triple negative; high risk for recurrence on Mammoprint.  . Malignant neoplasm of upper-outer quadrant of female breast ([place]) 05/08/2010    Left breast: T1b, N0, M); triple negative, DCIS present. No chemotherapy. MammoSite radiation.  . Breast cancer ([place])   . Hematuria   . Pancreatic lesion   . Neuropathy ([place])     pt states in fingertips and feet    PAST SURGICAL HISTORY :   Past Surgical History  Procedure Laterality Date  . Cesarean section  1988  . Colonoscopy  October 17, 2009    Dr. Dionne Milo, normal study.  . Breast mammosite  April 2012    mammosite placement and removal   . Kidney stone surgery  2003    stones removed by Dr. Quillian Quince- stent placed/removed  . Tubal ligation    . Breast lumpectomy with axillary lymph node biopsy Right 08/01/2014    Procedure: BREAST LUMPECTOMY WITH AXILLARY LYMPH NODE BIOPSY;  Surgeon: Robert Bellow, MD;  Location: [facility] ORS;  Service: General;  Laterality: Right;  . Sentinel node biopsy Right 08/01/2014    Procedure: SENTINEL NODE BIOPSY;  Surgeon: Robert Bellow, MD;  Location: [facility] ORS;  Service: General;  Laterality: Right;  . Breast surgery Left 2012    left breast wide excision, sentinel node, MammoSite  . Breast cyst aspiration  2010  . Breast surgery Right 08/06/2014    Wide excision/sentinel node biopsy, T1a, N0 triple negative, MammoSite  . Portacath placement Left 11/09/2014    Procedure: INSERTION PORT-A-CATH;  Surgeon: Robert Bellow, MD;  Location:  [facility] ORS;  Service: General;  Laterality: Left;    FAMILY HISTORY :   Family History  Problem Relation Age of Onset  . Ovarian cancer Mother   . Diabetes Mother   . Cancer Father     prostate  . Diabetes Father   . Breast cancer Maternal Aunt 60  . Hypertension Brother   . Cancer Maternal Grandmother     bone  . Cancer Maternal Grandfather     lung  . Cancer Paternal Grandmother     lung  . Asthma Paternal Grandfather   . Hypertension Sister     SOCIAL HISTORY:   Social History  Substance Use Topics  . Smoking status: Never Smoker   . Smokeless tobacco: Never Used  . Alcohol Use: Yes     Comment: occasionally    ALLERGIES:  is allergic to shellfish allergy and tape.  MEDICATIONS:  Current Outpatient Prescriptions  Medication Sig Dispense Refill  . albuterol (PROVENTIL HFA;VENTOLIN HFA) 108 (90 BASE) MCG/ACT inhaler Inhale 2 puffs into the lungs every 6 (six) hours as needed for wheezing or shortness of breath.    Marland Kitchen aspirin 81 MG tablet Take 81 mg by mouth daily.    . bisacodyl (DULCOLAX) 5 MG EC tablet Take 5 mg by mouth daily as needed for moderate constipation.    . Cholecalciferol (VITAMIN D3) 1000 UNITS CAPS Take 1 capsule by mouth daily.     Marland Kitchen esomeprazole (NEXIUM) 40 MG capsule Take 40 mg by mouth 2 (two) times daily as needed (acid reflux).     . gabapentin (NEURONTIN) 300 MG capsule Take 2 capsules (600 mg total) by mouth 3 (three) times daily. 540 capsule 2  . lisinopril (ZESTRIL) 10 MG tablet Take 1 tablet (10 mg total) by mouth daily. 90 tablet 3  . loratadine (CLARITIN) 10 MG tablet Take 10 mg by mouth daily. Reported on 04/13/2015    . polyethylene glycol (MIRALAX / GLYCOLAX) packet Take 17 g by mouth every other day.    Marland Kitchen POTASSIUM PO Take 1,000 mg by mouth daily.     No current facility-administered medications for this visit.   Facility-Administered Medications Ordered in Other Visits  Medication Dose Route Frequency Provider Last Rate Last Dose  .  sodium chloride 0.9 % injection 10 mL  10 mL Intravenous PRN Leia Alf, MD   10 mL at 11/10/14 1138    PHYSICAL EXAMINATION: ECOG PERFORMANCE STATUS: 1 - Symptomatic but completely ambulatory  BP 125/84 mmHg  Pulse 82  Temp(Src) 96.5 F (35.8 C)  Resp 18  Wt 188 lb (85.276 kg)  LMP  (LMP Unknown)  Filed Weights   09/26/15 0950  Weight: 188 lb (85.276 kg)    GENERAL: Well-nourished well-developed; Alert, no distress and comfortable.   She is alone.  EYES: no pallor or icterus OROPHARYNX: no thrush or ulceration; good dentition  NECK: supple, no masses felt LYMPH:  no palpable lymphadenopathy  in the cervical, axillary or inguinal regions LUNGS: clear to auscultation and  No wheeze or crackles HEART/CVS: regular rate & rhythm and no murmurs; No lower extremity edema ABDOMEN:abdomen soft, non-tender and normal bowel sounds Musculoskeletal:no cyanosis of digits and no clubbing  PSYCH: alert & oriented x 3 with fluent speech NEURO: no focal motor/sensory deficits SKIN:  no rashes or significant lesions.   LABORATORY DATA:  I have reviewed the data as listed    Component Value Date/Time   NA 139 09/26/2015 0850   NA 140 07/16/2011 2130   K 3.8 09/26/2015 0850   K 3.7 07/16/2011 2130   CL 107 09/26/2015 0850   CL 106 07/16/2011 2130   CO2 26 09/26/2015 0850   CO2 27 07/16/2011 2130   GLUCOSE 89 09/26/2015 0850   GLUCOSE 93 07/16/2011 2130   BUN 24* 09/26/2015 0850   BUN 16 07/16/2011 2130   CREATININE 0.98 09/26/2015 0850   CREATININE 0.95 07/16/2011 2130   CALCIUM 8.9 09/26/2015 0850   CALCIUM 9.0 07/16/2011 2130   PROT 7.6 09/26/2015 0850   ALBUMIN 3.9 09/26/2015 0850   AST 18 09/26/2015 0850   ALT 16 09/26/2015 0850   ALKPHOS 89 09/26/2015 0850   BILITOT 0.4 09/26/2015 0850   GFRNONAA >60 09/26/2015 0850   GFRNONAA >60 07/16/2011 2130   GFRAA >60 09/26/2015 0850   GFRAA >60 07/16/2011 2130    No results found for: SPEP, UPEP  Lab Results  Component  Value Date   WBC 6.2 09/26/2015   NEUTROABS 4.0 09/26/2015   HGB 10.3* 09/26/2015   HCT 30.3* 09/26/2015   MCV 95.5 09/26/2015   PLT 172 09/26/2015      Chemistry      Component Value Date/Time   NA 139 09/26/2015 0850   NA 140 07/16/2011 2130   K 3.8 09/26/2015 0850   K 3.7 07/16/2011 2130   CL 107 09/26/2015 0850   CL 106 07/16/2011 2130   CO2 26 09/26/2015 0850   CO2 27 07/16/2011 2130   BUN 24* 09/26/2015 0850   BUN 16 07/16/2011 2130   CREATININE 0.98 09/26/2015 0850   CREATININE 0.95 07/16/2011 2130      Component Value Date/Time   CALCIUM 8.9 09/26/2015 0850   CALCIUM 9.0 07/16/2011 2130   ALKPHOS 89 09/26/2015 0850   AST 18 09/26/2015 0850   ALT 16 09/26/2015 0850   BILITOT 0.4 09/26/2015 0850         ASSESSMENT & PLAN:   Cancer of overlapping sites of right female breast ([place]) # RIGHT BREAST CANCER STAGE I - Triple negative s/p on adjuvant AC x 4 followed byTaxol weekly times 8 [stopped sec to PN in jan 2017]. No concerns for recurrence. Recent mammogram negative.  # Peripheral neuropathy grade 2-improved- on Neurontin 600 TID  # Will review genetic counseling at next visit.  # Left Leg swelling- check US dopplers.   # follow up in 3 months with labs or sooner.      Cancer of overlapping sites of right female breast [facility]) # RIGHT BREAST CANCER STAGE I - Triple negative s/p on adjuvant AC x 4 followed byTaxol weekly times 8 [stopped sec to PN in jan 2017]. No concerns for recurrence. Recent mammogram negative.  # Peripheral neuropathy grade 2-improved- on Neurontin 600 TID  # Will review genetic counseling at next visit.  # Left Leg swelling- check US dopplers.   # follow up in 3 months with labs or sooner.  Cammie Sickle, MD 09/26/2015 10:34 PM

## 2015-09-26 NOTE — Assessment & Plan Note (Signed)
#   RIGHT BREAST CANCER STAGE I - Triple negative s/p on adjuvant AC x 4 followed byTaxol weekly times 8 [stopped sec to PN in jan 2017]. No concerns for recurrence. Recent mammogram negative.  # Peripheral neuropathy grade 2-improved- on Neurontin 600 TID  # Will review genetic counseling at next visit.  # Left Leg swelling- check US dopplers.   # follow up in 3 months with labs or sooner.

## 2015-09-26 NOTE — Progress Notes (Signed)
Patient states her left foot and ankle swell significantly.  So much so that she cannot wear her shoe.  States she lies down and it improves.  Also c/o neuropathy in her feet.

## 2015-09-27 ENCOUNTER — Ambulatory Visit
Admission: RE | Admit: 2015-09-27 | Discharge: 2015-09-27 | Disposition: A | Discharge status: Home / Self Care | Payer: 59 | Source: Ambulatory Visit | Attending: Internal Medicine | Admitting: Internal Medicine

## 2015-09-27 ENCOUNTER — Telehealth: Payer: Self-pay | Admitting: *Deleted

## 2015-09-27 DIAGNOSIS — C50811 Malignant neoplasm of overlapping sites of right female breast: Secondary | ICD-10-CM | POA: Diagnosis not present

## 2015-09-27 DIAGNOSIS — M79605 Pain in left leg: Secondary | ICD-10-CM | POA: Insufficient documentation

## 2015-09-27 DIAGNOSIS — M7989 Other specified soft tissue disorders: Secondary | ICD-10-CM | POA: Insufficient documentation

## 2015-09-27 DIAGNOSIS — M79662 Pain in left lower leg: Secondary | ICD-10-CM

## 2015-09-27 NOTE — Telephone Encounter (Signed)
-----   Message from Cammie Sickle, MD sent at 09/27/2015  2:59 PM EDT ----- Please inform pt- that ultrasound is negative for blood clot. Keep legs elevated. Dr.B

## 2015-09-28 NOTE — Telephone Encounter (Signed)
Called patient to inform her that [place] is negative for blood clot.  Recommends she keep her legs elevated when sitting.

## 2015-10-15 ENCOUNTER — Ambulatory Visit: Payer: 59 | Admitting: Radiation Oncology

## 2015-10-18 ENCOUNTER — Inpatient Hospital Stay
Admission: RE | Admit: 2015-10-18 | Discharge: 2015-10-18 | Disposition: A | Discharge status: Home / Self Care | Payer: 59 | Source: Ambulatory Visit | Attending: Radiation Oncology | Admitting: Radiation Oncology

## 2015-10-18 ENCOUNTER — Ambulatory Visit
Admission: RE | Admit: 2015-10-18 | Discharge: 2015-10-18 | Disposition: A | Discharge status: Home / Self Care | Payer: 59 | Source: Ambulatory Visit | Attending: Radiation Oncology | Admitting: Radiation Oncology

## 2015-10-18 ENCOUNTER — Encounter: Payer: Self-pay | Admitting: Radiation Oncology

## 2015-10-18 VITALS — BP 139/81 | HR 77 | Temp 96.5°F | Resp 18 | Wt 189.2 lb

## 2015-10-18 DIAGNOSIS — C50411 Malignant neoplasm of upper-outer quadrant of right female breast: Secondary | ICD-10-CM

## 2015-10-18 DIAGNOSIS — Z853 Personal history of malignant neoplasm of breast: Secondary | ICD-10-CM | POA: Diagnosis not present

## 2015-10-18 DIAGNOSIS — Z923 Personal history of irradiation: Secondary | ICD-10-CM | POA: Diagnosis not present

## 2015-10-18 NOTE — Progress Notes (Signed)
Radiation Oncology Follow up Note  Name: Adrienne [name]   Date:   10/18/2015 MRN:  [id number] DOB: [birth year]    This 55 y.o. female presents to the clinic today for follow-up for bilateral accelerated partial breast radiation now out 1 year since treatment to her right breast. She's 4 years out accelerated partial breast radiation to her left breast.  REFERRING PROVIDER: Robert Bellow, MD  HPI: Patient is a 55 year old female status post bilateral accelerated partial breast radiation. Her left breast was treated for half years prior 1 year since her right breast. Her left breast was a triple negative stage I disease. She is doing well. She specifically denies. Breast tenderness cough or bone pain. She was triple negative so is not on antiestrogen therapy. Follow-up mammograms have been fine.  COMPLICATIONS OF TREATMENT: none  FOLLOW UP COMPLIANCE: keeps appointments   PHYSICAL EXAM:  BP 139/81   Pulse 77   Temp (!) 96.5 F (35.8 C)   Resp 18   Wt 189 lb 2.5 oz (85.8 kg)   LMP  (LMP Unknown)   BMI 31.48 kg/m  Lungs are clear to A&P cardiac examination essentially unremarkable with regular rate and rhythm. No dominant mass or nodularity is noted in either breast in 2 positions examined. Incision is well-healed. No axillary or supraclavicular adenopathy is appreciated. Cosmetic result is excellent. Well-developed well-nourished patient in NAD. HEENT reveals PERLA, EOMI, discs not visualized.  Oral cavity is clear. No oral mucosal lesions are identified. Neck is clear without evidence of cervical or supraclavicular adenopathy. Lungs are clear to A&P. Cardiac examination is essentially unremarkable with regular rate and rhythm without murmur rub or thrill. Abdomen is benign with no organomegaly or masses noted. Motor sensory and DTR levels are equal and symmetric in the upper and lower extremities. Cranial nerves II through XII are grossly intact. Proprioception is intact. No peripheral  adenopathy or edema is identified. No motor or sensory levels are noted. Crude visual fields are within normal range.  RADIOLOGY RESULTS: Mammograms from March 2017 are BI-RADS 2 benign  PLAN: Present time she continues to do well with no evidence of disease. I am please were overall progress. I've asked to see her back in 1 year for follow-up. She or he has scheduled follow-up mammograms. Patient is to call with any concerns.  I would like to take this opportunity to thank you for allowing me to participate in the care of your patient.Armstead Peaks., MD

## 2015-12-27 ENCOUNTER — Other Ambulatory Visit: Payer: 59

## 2015-12-27 ENCOUNTER — Ambulatory Visit: Payer: 59 | Admitting: Internal Medicine

## 2015-12-27 ENCOUNTER — Inpatient Hospital Stay: Payer: 59 | Attending: Internal Medicine | Admitting: Internal Medicine

## 2015-12-27 ENCOUNTER — Inpatient Hospital Stay: Payer: 59

## 2015-12-27 VITALS — BP 149/68 | HR 87 | Temp 97.1°F | Resp 18 | Ht 65.0 in | Wt 190.5 lb

## 2015-12-27 DIAGNOSIS — Z853 Personal history of malignant neoplasm of breast: Secondary | ICD-10-CM

## 2015-12-27 DIAGNOSIS — C50811 Malignant neoplasm of overlapping sites of right female breast: Secondary | ICD-10-CM

## 2015-12-27 DIAGNOSIS — Z923 Personal history of irradiation: Secondary | ICD-10-CM | POA: Diagnosis not present

## 2015-12-27 DIAGNOSIS — E669 Obesity, unspecified: Secondary | ICD-10-CM | POA: Diagnosis not present

## 2015-12-27 DIAGNOSIS — Z79899 Other long term (current) drug therapy: Secondary | ICD-10-CM | POA: Diagnosis not present

## 2015-12-27 DIAGNOSIS — Z87442 Personal history of urinary calculi: Secondary | ICD-10-CM | POA: Insufficient documentation

## 2015-12-27 DIAGNOSIS — Z7982 Long term (current) use of aspirin: Secondary | ICD-10-CM | POA: Insufficient documentation

## 2015-12-27 DIAGNOSIS — Z171 Estrogen receptor negative status [ER-]: Secondary | ICD-10-CM | POA: Diagnosis not present

## 2015-12-27 DIAGNOSIS — G629 Polyneuropathy, unspecified: Secondary | ICD-10-CM | POA: Insufficient documentation

## 2015-12-27 DIAGNOSIS — I10 Essential (primary) hypertension: Secondary | ICD-10-CM | POA: Diagnosis not present

## 2015-12-27 DIAGNOSIS — Z9221 Personal history of antineoplastic chemotherapy: Secondary | ICD-10-CM | POA: Diagnosis not present

## 2015-12-27 DIAGNOSIS — C50912 Malignant neoplasm of unspecified site of left female breast: Secondary | ICD-10-CM

## 2015-12-27 DIAGNOSIS — D649 Anemia, unspecified: Secondary | ICD-10-CM | POA: Diagnosis not present

## 2015-12-27 DIAGNOSIS — J45909 Unspecified asthma, uncomplicated: Secondary | ICD-10-CM | POA: Diagnosis not present

## 2015-12-27 DIAGNOSIS — K219 Gastro-esophageal reflux disease without esophagitis: Secondary | ICD-10-CM | POA: Diagnosis not present

## 2015-12-27 LAB — COMPREHENSIVE METABOLIC PANEL
ALK PHOS: 86 U/L (ref 38–126)
ALT: 17 U/L (ref 14–54)
ANION GAP: 8 (ref 5–15)
AST: 21 U/L (ref 15–41)
Albumin: 4 g/dL (ref 3.5–5.0)
BILIRUBIN TOTAL: 0.5 mg/dL (ref 0.3–1.2)
BUN: 20 mg/dL (ref 6–20)
CALCIUM: 9.1 mg/dL (ref 8.9–10.3)
CO2: 25 mmol/L (ref 22–32)
Chloride: 105 mmol/L (ref 101–111)
Creatinine, Ser: 0.83 mg/dL (ref 0.44–1.00)
Glucose, Bld: 94 mg/dL (ref 65–99)
Potassium: 3.6 mmol/L (ref 3.5–5.1)
SODIUM: 138 mmol/L (ref 135–145)
TOTAL PROTEIN: 7.8 g/dL (ref 6.5–8.1)

## 2015-12-27 LAB — CBC WITH DIFFERENTIAL/PLATELET
Basophils Absolute: 0 10*3/uL (ref 0–0.1)
Basophils Relative: 0 %
EOS ABS: 0.1 10*3/uL (ref 0–0.7)
Eosinophils Relative: 2 %
HCT: 31.5 % — ABNORMAL LOW (ref 35.0–47.0)
HEMOGLOBIN: 10.6 g/dL — AB (ref 12.0–16.0)
LYMPHS ABS: 1.7 10*3/uL (ref 1.0–3.6)
Lymphocytes Relative: 25 %
MCH: 32 pg (ref 26.0–34.0)
MCHC: 33.5 g/dL (ref 32.0–36.0)
MCV: 95.4 fL (ref 80.0–100.0)
MONOS PCT: 9 %
Monocytes Absolute: 0.6 10*3/uL (ref 0.2–0.9)
NEUTROS PCT: 64 %
Neutro Abs: 4.2 10*3/uL (ref 1.4–6.5)
Platelets: 189 10*3/uL (ref 150–440)
RBC: 3.3 MIL/uL — ABNORMAL LOW (ref 3.80–5.20)
RDW: 14.5 % (ref 11.5–14.5)
WBC: 6.7 10*3/uL (ref 3.6–11.0)

## 2015-12-27 NOTE — Progress Notes (Signed)
gabapentin still present in hands and feet. Has noted improvement in hands/fingertips. Gait is still affected by neuropathy in feet.  Improvement in lower extremity edema with elevation per pt.

## 2015-12-27 NOTE — Progress Notes (Signed)
[place] OFFICE PROGRESS NOTE  Patient Care Team: Kathrine Haddock, NP as PCP - General (Nurse Practitioner) Robert Bellow, MD (General Surgery) Cammie Sickle, MD as Consulting Physician (Internal Medicine)   SUMMARY OF ONCOLOGIC HISTORY:  Oncology History   # MAY 2016- RIGHT BREAST CANCER- Triple negative [T=0.5cm;G-3; margins-Neg]; Mammaprint- 0.814/molecular basal type [5 year risk- 22%; 10 year risk-29%] AC q3 W- taxol q w x 8/ 12 [sec to PN; finished Feb 2017].   # 2012 LEFT BREAST CA STAGE I s/p Lumpec [T1 (0.7CM);Triple Neg; G-3]; s/p mammosite RT; No adj chemo  #  Mild Anemia/M-protein 0.8gm/dl [sep 2016]      Carcinoma of overlapping sites of right breast in female, estrogen receptor negative ([place])    INTERVAL HISTORY:  A pleasant 55 year old female patient with above history of stage I right-sided triple negative breast cancer - Is here for follow-up.  Patient continues to have tingling and numbness of her lower extremities. No falls. She is taking Neurontin 300  times a day. The tingling and numbness in her hands is improved. She complains of pain in the legs/feet after walking many feet. Interested in a parking sticker.  Otherwise no chest pain shortness of breath or cough.  REVIEW OF SYSTEMS:  A complete 10 point review of system is done which is negative except mentioned above/history of present illness.   PAST MEDICAL HISTORY :  Past Medical History:  Diagnosis Date  . Anemia   . Asthma   . Breast cancer ([place])   . Breast cyst 2010   cyst- drained  . Breast screening, unspecified   . Constipation   . Dry eyes 2012  . GERD (gastroesophageal reflux disease)   . Hematuria   . Kidney stone   . Lump or mass in breast   . Malignant neoplasm of breast (female), unspecified site [facility]) 07/08/2014   Right breast, 5 mm, T1a,N0, triple negative; high risk for recurrence on Mammoprint.  . Malignant neoplasm of upper-outer quadrant of female  breast ([place]) 05/08/2010   Left breast: T1b, N0, M); triple negative, DCIS present. No chemotherapy. MammoSite radiation.  . Neuropathy ([place])    pt states in fingertips and feet  . Obesity, unspecified   . Pancreatic lesion   . 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 Screening for obesity   . Seasonal allergies   . Special screening for malignant neoplasms, colon   . Unspecified essential hypertension     PAST SURGICAL HISTORY :   Past Surgical History:  Procedure Laterality Date  . BREAST CYST ASPIRATION  2010  . BREAST LUMPECTOMY WITH AXILLARY LYMPH NODE BIOPSY Right 08/01/2014   Procedure: BREAST LUMPECTOMY WITH AXILLARY LYMPH NODE BIOPSY;  Surgeon: Robert Bellow, MD;  Location: [facility] ORS;  Service: General;  Laterality: Right;  . BREAST MAMMOSITE  April 2012   mammosite placement and removal   . BREAST SURGERY Left 2012   left breast wide excision, sentinel node, MammoSite  . BREAST SURGERY Right 08/06/2014   Wide excision/sentinel node biopsy, T1a, N0 triple negative, MammoSite  . CESAREAN SECTION  1988  . COLONOSCOPY  October 17, 2009   Dr. Dionne Milo, normal study.  Marland Kitchen KIDNEY STONE SURGERY  2003   stones removed by Dr. Quillian Quince- stent placed/removed  . PORTACATH PLACEMENT  Left 11/09/2014   Procedure: INSERTION PORT-A-CATH;  Surgeon: Robert Bellow, MD;  Location: [facility] ORS;  Service: General;  Laterality: Left;  . SENTINEL NODE BIOPSY Right 08/01/2014   Procedure: SENTINEL NODE BIOPSY;  Surgeon: Robert Bellow, MD;  Location: [facility] ORS;  Service: General;  Laterality: Right;  . TUBAL LIGATION      FAMILY HISTORY :   Family History  Problem Relation Age of Onset  . Ovarian cancer Mother   . Diabetes Mother   . Cancer Father     prostate  . Diabetes Father   . Hypertension Brother    . Cancer Maternal Grandmother     bone  . Cancer Maternal Grandfather     lung  . Cancer Paternal Grandmother     lung  . Asthma Paternal Grandfather   . Hypertension Sister   . Breast cancer Maternal Aunt 60    SOCIAL HISTORY:   Social History  Substance Use Topics  . Smoking status: Never Smoker  . Smokeless tobacco: Never Used  . Alcohol use Yes     Comment: occasionally    ALLERGIES:  is allergic to cayenne pepper [cayenne]; shellfish allergy; and tape.  MEDICATIONS:  Current Outpatient Prescriptions  Medication Sig Dispense Refill  . aspirin 81 MG tablet Take 81 mg by mouth daily.    . Cholecalciferol (VITAMIN D3) 1000 UNITS CAPS Take 1 capsule by mouth daily.     Marland Kitchen esomeprazole (NEXIUM) 40 MG capsule Take 40 mg by mouth 2 (two) times daily as needed (acid reflux).     . gabapentin (NEURONTIN) 300 MG capsule Take 2 capsules (600 mg total) by mouth 3 (three) times daily. 540 capsule 2  . lisinopril (ZESTRIL) 10 MG tablet Take 1 tablet (10 mg total) by mouth daily. 90 tablet 3  . loratadine (CLARITIN) 10 MG tablet Take 10 mg by mouth daily. Reported on 04/13/2015    . polyethylene glycol (MIRALAX / GLYCOLAX) packet Take 17 g by mouth every other day.    Marland Kitchen POTASSIUM PO Take 1 tablet by mouth daily.     Marland Kitchen albuterol (PROVENTIL HFA;VENTOLIN HFA) 108 (90 BASE) MCG/ACT inhaler Inhale 2 puffs into the lungs every 6 (six) hours as needed for wheezing or shortness of breath.    . bisacodyl (DULCOLAX) 5 MG EC tablet Take 5 mg by mouth daily as needed for moderate constipation.     No current facility-administered medications for this visit.    Facility-Administered Medications Ordered in Other Visits  Medication Dose Route Frequency Provider Last Rate Last Dose  . sodium chloride 0.9 % injection 10 mL  10 mL Intravenous PRN Leia Alf, MD   10 mL at 11/10/14 1138    PHYSICAL EXAMINATION: ECOG PERFORMANCE STATUS: 1 - Symptomatic but completely ambulatory  BP (!) 149/68  (Patient Position: Sitting)   Pulse 87   Temp 97.1 F (36.2 C) (Tympanic)   Resp 18   Ht 5\' 5"  (1.651 m)   Wt 190 lb 7.6 oz (86.4 kg)   LMP  (LMP Unknown)   BMI 31.70 kg/m   Filed Weights   12/27/15 0931  Weight: 190 lb 7.6 oz (86.4 kg)    GENERAL: Well-nourished well-developed; Alert, no distress and comfortable.   She is alone.  EYES: no pallor or icterus OROPHARYNX: no thrush or ulceration; good dentition  NECK: supple, no masses felt LYMPH:  no palpable lymphadenopathy in the cervical, axillary or inguinal regions LUNGS: clear to auscultation and  No wheeze or  crackles HEART/CVS: regular rate & rhythm and no murmurs; No lower extremity edema ABDOMEN:abdomen soft, non-tender and normal bowel sounds Musculoskeletal:no cyanosis of digits and no clubbing  PSYCH: alert & oriented x 3 with fluent speech NEURO: no focal motor/sensory deficits SKIN:  no rashes or significant lesions.   LABORATORY DATA:  I have reviewed the data as listed    Component Value Date/Time   NA 138 12/27/2015 0903   NA 140 07/16/2011 2130   K 3.6 12/27/2015 0903   K 3.7 07/16/2011 2130   CL 105 12/27/2015 0903   CL 106 07/16/2011 2130   CO2 25 12/27/2015 0903   CO2 27 07/16/2011 2130   GLUCOSE 94 12/27/2015 0903   GLUCOSE 93 07/16/2011 2130   BUN 20 12/27/2015 0903   BUN 16 07/16/2011 2130   CREATININE 0.83 12/27/2015 0903   CREATININE 0.95 07/16/2011 2130   CALCIUM 9.1 12/27/2015 0903   CALCIUM 9.0 07/16/2011 2130   PROT 7.8 12/27/2015 0903   ALBUMIN 4.0 12/27/2015 0903   AST 21 12/27/2015 0903   ALT 17 12/27/2015 0903   ALKPHOS 86 12/27/2015 0903   BILITOT 0.5 12/27/2015 0903   GFRNONAA >60 12/27/2015 0903   GFRNONAA >60 07/16/2011 2130   GFRAA >60 12/27/2015 0903   GFRAA >60 07/16/2011 2130    No results found for: SPEP, UPEP  Lab Results  Component Value Date   WBC 6.7 12/27/2015   NEUTROABS 4.2 12/27/2015   HGB 10.6 (L) 12/27/2015   HCT 31.5 (L) 12/27/2015   MCV 95.4  12/27/2015   PLT 189 12/27/2015      Chemistry      Component Value Date/Time   NA 138 12/27/2015 0903   NA 140 07/16/2011 2130   K 3.6 12/27/2015 0903   K 3.7 07/16/2011 2130   CL 105 12/27/2015 0903   CL 106 07/16/2011 2130   CO2 25 12/27/2015 0903   CO2 27 07/16/2011 2130   BUN 20 12/27/2015 0903   BUN 16 07/16/2011 2130   CREATININE 0.83 12/27/2015 0903   CREATININE 0.95 07/16/2011 2130      Component Value Date/Time   CALCIUM 9.1 12/27/2015 0903   CALCIUM 9.0 07/16/2011 2130   ALKPHOS 86 12/27/2015 0903   AST 21 12/27/2015 0903   ALT 17 12/27/2015 0903   BILITOT 0.5 12/27/2015 0903         ASSESSMENT & PLAN:   Carcinoma of overlapping sites of right breast in female, estrogen receptor negative ([place]) # RIGHT BREAST CANCER STAGE I - Triple negative s/p on adjuvant AC x 4 followed byTaxol weekly times 8 [stopped sec to PN in jan 2017]. No concerns for recurrence.   # Peripheral neuropathy grade 2-improved in hands; still in feet- on Neurontin 300 TID. Will need parking sticker.   # Will check if pt had genetic testing done; s/p genetic counseling.  # Anemia- Hb-10.5/ improving.   # follow up in 4 months with labs.     Carcinoma of overlapping sites of right breast in female, estrogen receptor negative ([place]) # RIGHT BREAST CANCER STAGE I - Triple negative s/p on adjuvant AC x 4 followed byTaxol weekly times 8 [stopped sec to PN in jan 2017]. No concerns for recurrence.   # Peripheral neuropathy grade 2-improved in hands; still in feet- on Neurontin 300 TID. Will need parking sticker.   # Will check if pt had genetic testing done; s/p genetic counseling.  # Anemia- Hb-10.5/ improving.   # follow up  in 4 months with labs.        Cammie Sickle, MD 12/27/2015 10:11 AM

## 2015-12-27 NOTE — Assessment & Plan Note (Addendum)
#   RIGHT BREAST CANCER STAGE I - Triple negative s/p on adjuvant AC x 4 followed byTaxol weekly times 8 [stopped sec to PN in jan 2017]. No concerns for recurrence.   # Peripheral neuropathy grade 2-improved in hands; still in feet- on Neurontin 300 TID. Will need parking sticker.   # Will check if pt had genetic testing done; s/p genetic counseling.  # Anemia- Hb-10.5/ improving.   # follow up in 4 months with labs.

## 2016-04-24 ENCOUNTER — Other Ambulatory Visit: Payer: Self-pay

## 2016-04-24 DIAGNOSIS — C50412 Malignant neoplasm of upper-outer quadrant of left female breast: Secondary | ICD-10-CM

## 2016-04-28 ENCOUNTER — Inpatient Hospital Stay: Payer: 59

## 2016-04-28 ENCOUNTER — Inpatient Hospital Stay: Payer: 59 | Attending: Internal Medicine | Admitting: Internal Medicine

## 2016-04-28 ENCOUNTER — Telehealth: Payer: Self-pay | Admitting: Internal Medicine

## 2016-04-28 VITALS — BP 137/79 | HR 72 | Temp 96.7°F | Wt 198.5 lb

## 2016-04-28 DIAGNOSIS — I10 Essential (primary) hypertension: Secondary | ICD-10-CM | POA: Diagnosis not present

## 2016-04-28 DIAGNOSIS — E669 Obesity, unspecified: Secondary | ICD-10-CM | POA: Insufficient documentation

## 2016-04-28 DIAGNOSIS — C50811 Malignant neoplasm of overlapping sites of right female breast: Secondary | ICD-10-CM

## 2016-04-28 DIAGNOSIS — D649 Anemia, unspecified: Secondary | ICD-10-CM

## 2016-04-28 DIAGNOSIS — G629 Polyneuropathy, unspecified: Secondary | ICD-10-CM | POA: Diagnosis not present

## 2016-04-28 DIAGNOSIS — Z853 Personal history of malignant neoplasm of breast: Secondary | ICD-10-CM | POA: Diagnosis not present

## 2016-04-28 DIAGNOSIS — Z79899 Other long term (current) drug therapy: Secondary | ICD-10-CM

## 2016-04-28 DIAGNOSIS — Z9221 Personal history of antineoplastic chemotherapy: Secondary | ICD-10-CM | POA: Insufficient documentation

## 2016-04-28 DIAGNOSIS — Z171 Estrogen receptor negative status [ER-]: Secondary | ICD-10-CM

## 2016-04-28 DIAGNOSIS — Z7982 Long term (current) use of aspirin: Secondary | ICD-10-CM | POA: Diagnosis not present

## 2016-04-28 DIAGNOSIS — J45909 Unspecified asthma, uncomplicated: Secondary | ICD-10-CM | POA: Insufficient documentation

## 2016-04-28 DIAGNOSIS — K219 Gastro-esophageal reflux disease without esophagitis: Secondary | ICD-10-CM | POA: Insufficient documentation

## 2016-04-28 LAB — COMPREHENSIVE METABOLIC PANEL
ALBUMIN: 3.8 g/dL (ref 3.5–5.0)
ALT: 16 U/L (ref 14–54)
ANION GAP: 6 (ref 5–15)
AST: 18 U/L (ref 15–41)
Alkaline Phosphatase: 79 U/L (ref 38–126)
BILIRUBIN TOTAL: 0.6 mg/dL (ref 0.3–1.2)
BUN: 21 mg/dL — ABNORMAL HIGH (ref 6–20)
CO2: 25 mmol/L (ref 22–32)
Calcium: 9.2 mg/dL (ref 8.9–10.3)
Chloride: 107 mmol/L (ref 101–111)
Creatinine, Ser: 0.8 mg/dL (ref 0.44–1.00)
GFR calc Af Amer: >60 mL/min (ref >60)
GFR calc non Af Amer: >60 mL/min (ref >60)
GLUCOSE: 100 mg/dL — AB (ref 65–99)
POTASSIUM: 3.6 mmol/L (ref 3.5–5.1)
Sodium: 138 mmol/L (ref 135–145)
TOTAL PROTEIN: 7.8 g/dL (ref 6.5–8.1)

## 2016-04-28 LAB — CBC WITH DIFFERENTIAL/PLATELET
BASOS ABS: 0 10*3/uL (ref 0–0.1)
Basophils Relative: 0 %
Eosinophils Absolute: 0.1 10*3/uL (ref 0–0.7)
Eosinophils Relative: 2 %
HEMATOCRIT: 31.5 % — AB (ref 35.0–47.0)
HEMOGLOBIN: 10.7 g/dL — AB (ref 12.0–16.0)
LYMPHS PCT: 26 %
Lymphs Abs: 1.3 10*3/uL (ref 1.0–3.6)
MCH: 32.4 pg (ref 26.0–34.0)
MCHC: 34.1 g/dL (ref 32.0–36.0)
MCV: 95 fL (ref 80.0–100.0)
MONO ABS: 0.4 10*3/uL (ref 0.2–0.9)
Monocytes Relative: 8 %
NEUTROS ABS: 3.4 10*3/uL (ref 1.4–6.5)
Neutrophils Relative %: 64 %
Platelets: 210 10*3/uL (ref 150–440)
RBC: 3.31 MIL/uL — AB (ref 3.80–5.20)
RDW: 14.8 % — ABNORMAL HIGH (ref 11.5–14.5)
WBC: 5.3 10*3/uL (ref 3.6–11.0)

## 2016-04-28 NOTE — Assessment & Plan Note (Addendum)
#   RIGHT BREAST CANCER STAGE I - Triple negative s/p adjuvant AC x 4 followed byTaxol weekly times 8 [stopped sec to PN in Jan 2017]. No concerns for recurrence.   # Peripheral neuropathy grade 2-improved in hands; still in feet- not taking Neurontin as prescribed due to drowsiness. Monitor.  #Will check if pt had genetic testing done; s/p genetic counseling.  # Anemia- Hb-10.7/ improving.   # follow up in 4 months with labs.

## 2016-04-28 NOTE — Progress Notes (Signed)
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   # 2012 LEFT BREAST CA STAGE I s/p Lumpec [T1 (0.7CM);Triple Neg; G-3]; s/p mammosite RT; No adj chemo  #  Mild Anemia/M-protein 0.8gm/dl [sep 2016]      Carcinoma of overlapping sites of right breast in female, estrogen receptor negative ([place])    INTERVAL HISTORY:  A pleasant 56 year old female patient with above history of stage I right-sided triple negative breast cancer - here for follow-up.  Patient continues to have tingling and numbness of her feet, stable.  No falls.  She states that the bottom of her feet "feel like pads", and she is very careful when walking.  She has stopped taking Neurontin as it made her sleepy and unable to work. The tingling and numbness in her hands is slightly improved. She denies any pain.  No chest pain or shortness of breath. She complains of sporadic, but not bothersome, cough caused by lisinopril.  REVIEW OF SYSTEMS:  A complete 10 point review of system is done which is negative except mentioned above/history of present illness.   PAST MEDICAL HISTORY :  Past Medical History:  Diagnosis Date  . Anemia   . Asthma   . Breast cancer ([place])   . Breast cyst 2010   cyst- drained  . Breast screening, unspecified   . Constipation   . Dry eyes 2012  . GERD (gastroesophageal reflux disease)   . Hematuria   . Kidney stone   . Lump or mass in breast   . Malignant neoplasm of breast (female), unspecified site [facility]) 07/08/2014   Right breast, 5 mm, T1a,N0,  triple negative; high risk for recurrence on Mammoprint.  . Malignant neoplasm of upper-outer quadrant of female breast ([place]) 05/08/2010   Left breast: T1b, N0, M); triple negative, DCIS present. No chemotherapy. MammoSite radiation.  . Neuropathy ([place])    pt states in fingertips and feet  . Obesity, unspecified   . Pancreatic lesion   . 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 Screening for obesity   . Seasonal allergies   . Special screening for malignant neoplasms, colon   . Unspecified essential hypertension     PAST SURGICAL HISTORY :   Past Surgical History:  Procedure Laterality Date  . BREAST CYST ASPIRATION  2010  . BREAST LUMPECTOMY WITH AXILLARY LYMPH NODE BIOPSY Right 08/01/2014   Procedure: BREAST LUMPECTOMY WITH AXILLARY LYMPH NODE BIOPSY;  Surgeon: Robert Bellow, MD;  Location: [facility] ORS;  Service: General;  Laterality: Right;  . BREAST MAMMOSITE  April 2012   mammosite placement and removal   . BREAST SURGERY Left 2012   left breast wide excision, sentinel node, MammoSite  . BREAST SURGERY Right 08/06/2014   Wide excision/sentinel node biopsy, T1a, N0 triple negative, MammoSite  . CESAREAN SECTION  1988  . COLONOSCOPY  October 17, 2009   Dr.  Iftikhar, normal study.  Marland Kitchen KIDNEY STONE SURGERY  2003   stones removed by Dr. Quillian Quince- stent placed/removed  . PORTACATH PLACEMENT Left 11/09/2014   Procedure: INSERTION PORT-A-CATH;  Surgeon: Robert Bellow, MD;  Location: [facility] ORS;  Service: General;  Laterality: Left;  . SENTINEL NODE BIOPSY Right 08/01/2014   Procedure: SENTINEL NODE BIOPSY;  Surgeon: Robert Bellow, MD;  Location: [facility] ORS;  Service: General;  Laterality: Right;  . TUBAL LIGATION      FAMILY HISTORY :   Family History  Problem Relation Age of Onset  . Ovarian  cancer Mother   . Diabetes Mother   . Cancer Father     prostate  . Diabetes Father   . Hypertension Brother   . Cancer Maternal Grandmother     bone  . Cancer Maternal Grandfather     lung  . Cancer Paternal Grandmother     lung  . Asthma Paternal Grandfather   . Hypertension Sister   . Breast cancer Maternal Aunt 60    SOCIAL HISTORY:   Social History  Substance Use Topics  . Smoking status: Never Smoker  . Smokeless tobacco: Never Used  . Alcohol use Yes     Comment: occasionally    ALLERGIES:  is allergic to cayenne pepper [cayenne]; shellfish allergy; and tape.  MEDICATIONS:  Current Outpatient Prescriptions  Medication Sig Dispense Refill  . albuterol (PROVENTIL HFA;VENTOLIN HFA) 108 (90 BASE) MCG/ACT inhaler Inhale 2 puffs into the lungs every 6 (six) hours as needed for wheezing or shortness of breath.    Marland Kitchen aspirin 81 MG tablet Take 81 mg by mouth daily.    . bisacodyl (DULCOLAX) 5 MG EC tablet Take 5 mg by mouth daily as needed for moderate constipation.    . Cholecalciferol (VITAMIN D3) 1000 UNITS CAPS Take 1 capsule by mouth daily.     Marland Kitchen esomeprazole (NEXIUM) 40 MG capsule Take 40 mg by mouth 2 (two) times daily as needed (acid reflux).     . gabapentin (NEURONTIN) 300 MG capsule Take 2 capsules (600 mg total) by mouth 3 (three) times daily. 540 capsule 2  . lisinopril (ZESTRIL) 10 MG tablet Take 1 tablet (10 mg total) by mouth daily. 90 tablet 3  . loratadine (CLARITIN) 10 MG tablet Take 10 mg by mouth daily. Reported on 04/13/2015    . polyethylene glycol (MIRALAX / GLYCOLAX) packet Take 17 g by mouth every other day.    Marland Kitchen POTASSIUM PO Take 1 tablet by mouth daily.      No current facility-administered medications for this visit.    Facility-Administered Medications Ordered in Other Visits  Medication Dose Route Frequency Provider Last Rate Last Dose  . sodium chloride 0.9 % injection 10 mL  10 mL Intravenous PRN Leia Alf, MD   10 mL at 11/10/14 1138     PHYSICAL EXAMINATION: ECOG PERFORMANCE STATUS: 1 - Symptomatic but completely ambulatory  BP 137/79 (BP Location: Left Arm, Patient Position: Sitting)   Pulse 72   Temp (!) 96.7 F (35.9 C) (Tympanic)   Wt 198 lb 8 oz (90 kg)   LMP  (LMP Unknown)   BMI 33.03 kg/m   Filed Weights   04/28/16 1103  Weight: 198 lb 8 oz (90 kg)    GENERAL: Well-nourished well-developed; Alert, no distress and comfortable.   She is alone.  EYES: no pallor or icterus OROPHARYNX: no thrush or ulceration; good dentition  NECK: supple, no masses felt LYMPH:  no palpable lymphadenopathy  in the cervical, axillary or inguinal regions LUNGS: clear to auscultation and  No wheeze or crackles HEART/CVS: regular rate & rhythm and no murmurs; No lower extremity edema ABDOMEN:abdomen soft, non-tender and normal bowel sounds Musculoskeletal:no cyanosis of digits and no clubbing  PSYCH: alert & oriented x 3 with fluent speech NEURO: no focal motor/sensory deficits SKIN:  no rashes or significant lesions.   LABORATORY DATA:  I have reviewed the data as listed    Component Value Date/Time   NA 138 04/28/2016 0958   NA 140 07/16/2011 2130   K 3.6 04/28/2016 0958   K 3.7 07/16/2011 2130   CL 107 04/28/2016 0958   CL 106 07/16/2011 2130   CO2 25 04/28/2016 0958   CO2 27 07/16/2011 2130   GLUCOSE 100 (H) 04/28/2016 0958   GLUCOSE 93 07/16/2011 2130   BUN 21 (H) 04/28/2016 0958   BUN 16 07/16/2011 2130   CREATININE 0.80 04/28/2016 0958   CREATININE 0.95 07/16/2011 2130   CALCIUM 9.2 04/28/2016 0958   CALCIUM 9.0 07/16/2011 2130   PROT 7.8 04/28/2016 0958   ALBUMIN 3.8 04/28/2016 0958   AST 18 04/28/2016 0958   ALT 16 04/28/2016 0958   ALKPHOS 79 04/28/2016 0958   BILITOT 0.6 04/28/2016 0958   GFRNONAA >60 04/28/2016 0958   GFRNONAA >60 07/16/2011 2130   GFRAA >60 04/28/2016 0958   GFRAA >60 07/16/2011 2130    No results found for: SPEP, UPEP  Lab Results  Component Value Date   WBC 5.3  04/28/2016   NEUTROABS 3.4 04/28/2016   HGB 10.7 (L) 04/28/2016   HCT 31.5 (L) 04/28/2016   MCV 95.0 04/28/2016   PLT 210 04/28/2016      Chemistry      Component Value Date/Time   NA 138 04/28/2016 0958   NA 140 07/16/2011 2130   K 3.6 04/28/2016 0958   K 3.7 07/16/2011 2130   CL 107 04/28/2016 0958   CL 106 07/16/2011 2130   CO2 25 04/28/2016 0958   CO2 27 07/16/2011 2130   BUN 21 (H) 04/28/2016 0958   BUN 16 07/16/2011 2130   CREATININE 0.80 04/28/2016 0958   CREATININE 0.95 07/16/2011 2130      Component Value Date/Time   CALCIUM 9.2 04/28/2016 0958   CALCIUM 9.0 07/16/2011 2130   ALKPHOS 79 04/28/2016 0958   AST 18 04/28/2016 0958   ALT 16 04/28/2016 0958   BILITOT 0.6 04/28/2016 0958         ASSESSMENT & PLAN:   Carcinoma of overlapping sites of right breast in female, estrogen receptor negative ([place]) # RIGHT BREAST CANCER STAGE I - Triple negative s/p adjuvant AC x 4 followed byTaxol weekly times 8 [stopped sec to PN in Jan 2017]. No concerns for recurrence.   # Peripheral neuropathy grade 2-improved in hands; still in feet- not taking Neurontin as prescribed due to drowsiness. Monitor.  #Will check if pt had genetic testing done; s/p genetic counseling.  # Anemia- Hb-10.7/ improving.   # follow up in 4 months with labs.   Lucendia Herrlich, NP  04/28/16 1:29 PM

## 2016-04-28 NOTE — Progress Notes (Signed)
Patient here today for follow up.  Patient states no new concerns today  

## 2016-04-28 NOTE — Telephone Encounter (Signed)
Pt was tested and negative for braca 1/2. Copy on pt's chart-scanned in under media

## 2016-04-28 NOTE — Telephone Encounter (Signed)
Please check if pt had genetic testing for her history of breast cancer; or else we will have to to do genetic testing at next visit- Thx

## 2016-05-19 ENCOUNTER — Encounter: Payer: Self-pay | Admitting: Unknown Physician Specialty

## 2016-05-19 ENCOUNTER — Ambulatory Visit (INDEPENDENT_AMBULATORY_CARE_PROVIDER_SITE_OTHER): Payer: 59 | Admitting: Unknown Physician Specialty

## 2016-05-19 VITALS — BP 129/82 | HR 80 | Temp 97.8°F | Ht 65.0 in | Wt 199.8 lb

## 2016-05-19 DIAGNOSIS — G620 Drug-induced polyneuropathy: Secondary | ICD-10-CM | POA: Insufficient documentation

## 2016-05-19 DIAGNOSIS — I10 Essential (primary) hypertension: Secondary | ICD-10-CM | POA: Diagnosis not present

## 2016-05-19 DIAGNOSIS — G6289 Other specified polyneuropathies: Secondary | ICD-10-CM | POA: Diagnosis not present

## 2016-05-19 DIAGNOSIS — Z0000 Encounter for general adult medical examination without abnormal findings: Secondary | ICD-10-CM

## 2016-05-19 DIAGNOSIS — T451X5A Adverse effect of antineoplastic and immunosuppressive drugs, initial encounter: Secondary | ICD-10-CM | POA: Insufficient documentation

## 2016-05-19 NOTE — Progress Notes (Signed)
BP 129/82 (BP Location: Left Arm, Cuff Size: Large)   Pulse 80   Temp 97.8 F (36.6 C)   Ht 5\' 5"  (1.651 m)   Wt 199 lb 12.8 oz (90.6 kg)   LMP  (LMP Unknown)   SpO2 100%   BMI 33.25 kg/m    Subjective:    Patient ID: Adrienne [name], female    DOB: August 19, 1960, 56 y.o.   MRN: [id number]  HPI: Adrienne [name] is a 56 y.o. female  Chief Complaint  Patient presents with  . Annual Exam    pt's last CPE in June 2017 according to chart    Social History   Social History  . Marital status: Single    Spouse name: N/A  . Number of children: N/A  . Years of education: N/A   Occupational History  . Not on file.   Social History Main Topics  . Smoking status: Never Smoker  . Smokeless tobacco: Never Used  . Alcohol use Yes     Comment: occasionally  . Drug use: No  . Sexual activity: Yes   Other Topics Concern  . Not on file   Social History Narrative  . No narrative on file   Family History  Problem Relation Age of Onset  . Ovarian cancer Mother   . Diabetes Mother   . Cancer Father     prostate  . Diabetes Father   . Hypertension Brother   . Cancer Maternal Grandmother     bone  . Cancer Maternal Grandfather     lung  . Cancer Paternal Grandmother     lung  . Asthma Paternal Grandfather   . Hypertension Sister   . Breast cancer Maternal Aunt 60   Past Medical History:  Diagnosis Date  . Anemia   . Asthma   . Breast cancer ([place])   . Breast cyst 2010   cyst- drained  . Breast screening, unspecified   . Constipation   . Dry eyes 2012  . GERD (gastroesophageal reflux disease)   . Hematuria   . Kidney stone   . Lump or mass in breast   . Malignant neoplasm of breast (female), unspecified site 07/08/2014   Right breast, 5 mm, T1a,N0, triple negative; high risk for recurrence on Mammoprint.  . Malignant neoplasm of upper-outer quadrant of female breast ([place]) 05/08/2010   Left breast: T1b, N0, M); triple negative, DCIS present. No chemotherapy.  MammoSite radiation.  . Neuropathy ([place])    pt states in fingertips and feet  . Obesity, unspecified   . Pancreatic lesion   . 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 Screening for obesity   . Seasonal allergies   . Special screening for malignant neoplasms, colon   . Unspecified essential hypertension    Past Surgical History:  Procedure Laterality Date  . BREAST CYST ASPIRATION  2010  . BREAST LUMPECTOMY WITH AXILLARY LYMPH NODE BIOPSY Right 08/01/2014   Procedure: BREAST LUMPECTOMY WITH AXILLARY LYMPH NODE BIOPSY;  Surgeon: Robert Bellow, MD;  Location: [facility] ORS;  Service: General;  Laterality: Right;  . BREAST MAMMOSITE  April 2012   mammosite placement and removal   . BREAST SURGERY Left 2012   left breast wide excision, sentinel node, MammoSite  . BREAST SURGERY Right 08/06/2014  Wide excision/sentinel node biopsy, T1a, N0 triple negative, MammoSite  . CESAREAN SECTION  1988  . COLONOSCOPY  October 17, 2009   Dr. Dionne Milo, normal study.  Marland Kitchen KIDNEY STONE SURGERY  2003   stones removed by Dr. Quillian Quince- stent placed/removed  . PORTACATH PLACEMENT Left 11/09/2014   Procedure: INSERTION PORT-A-CATH;  Surgeon: Robert Bellow, MD;  Location: [facility] ORS;  Service: General;  Laterality: Left;  . SENTINEL NODE BIOPSY Right 08/01/2014   Procedure: SENTINEL NODE BIOPSY;  Surgeon: Robert Bellow, MD;  Location: [facility] ORS;  Service: General;  Laterality: Right;  . TUBAL LIGATION     Pt is concerned about her 'blood being low" and states she is dizzy and starts "running into stuff."  States this comes and goes with turning over in bed and often feels the room spinning.  States she started taking iron.    Hypertension Using medications without difficulty Average home BPs   No problems or  lightheadedness No chest pain with exertion or shortness of breath No Edema  Neuropathy From Taxol hands and feet with pain and swelling in left leg.    Relevant past medical, surgical, family and social history reviewed and updated as indicated. Interim medical history since our last visit reviewed. Allergies and medications reviewed and updated.  Review of Systems  Constitutional: Negative.   HENT: Negative.   Eyes: Negative.   Respiratory: Negative.   Cardiovascular: Negative.   Gastrointestinal: Negative.   Endocrine: Negative.   Genitourinary: Negative.   Musculoskeletal: Negative.   Skin: Negative.   Allergic/Immunologic: Negative.   Neurological: Negative.   Hematological: Negative.   Psychiatric/Behavioral: Negative.     Per HPI unless specifically indicated above     Objective:    BP 129/82 (BP Location: Left Arm, Cuff Size: Large)   Pulse 80   Temp 97.8 F (36.6 C)   Ht 5\' 5"  (1.651 m)   Wt 199 lb 12.8 oz (90.6 kg)   LMP  (LMP Unknown)   SpO2 100%   BMI 33.25 kg/m   Wt Readings from Last 3 Encounters:  05/19/16 199 lb 12.8 oz (90.6 kg)  04/28/16 198 lb 8 oz (90 kg)  12/27/15 190 lb 7.6 oz (86.4 kg)    Physical Exam  Constitutional: She is oriented to person, place, and time. She appears well-developed and well-nourished. No distress.  HENT:  Head: Normocephalic and atraumatic.  Eyes: Conjunctivae and lids are normal. Right eye exhibits no discharge. Left eye exhibits no discharge. No scleral icterus.  Neck: Normal range of motion. Neck supple. No JVD present. Carotid bruit is not present.  Cardiovascular: Normal rate, regular rhythm and normal heart sounds.   Pulmonary/Chest: Effort normal and breath sounds normal.  Abdominal: Normal appearance. There is no splenomegaly or hepatomegaly.  Musculoskeletal: Normal range of motion.  Neurological: She is alert and oriented to person, place, and time.  Skin: Skin is warm, dry and intact. No rash noted. No  pallor.  Psychiatric: She has a normal mood and affect. Her behavior is normal. Judgment and thought content normal.      Assessment & Plan:   Problem List Items Addressed This Visit      Unprioritized   Hypertension    Stable, continue present medications.        Peripheral neuropathy (HCC)   Relevant Orders   Vitamin B12   VITAMIN D 25 Hydroxy (Vit-D Deficiency, Fractures)    Other Visit Diagnoses    Annual physical exam    -  Primary   Relevant Orders   TSH   Lipid Panel w/o Chol/HDL Ratio       Follow up plan: Return in about 6 months (around 11/19/2016).

## 2016-05-19 NOTE — Assessment & Plan Note (Signed)
Stable, continue present medications.   

## 2016-05-20 ENCOUNTER — Encounter: Payer: Self-pay | Admitting: Unknown Physician Specialty

## 2016-05-20 LAB — LIPID PANEL W/O CHOL/HDL RATIO
Cholesterol, Total: 179 mg/dL (ref 100–199)
HDL: 67 mg/dL (ref >39)
LDL CALC: 99 mg/dL (ref 0–99)
TRIGLYCERIDES: 66 mg/dL (ref 0–149)
VLDL Cholesterol Cal: 13 mg/dL (ref 5–40)

## 2016-05-20 LAB — TSH: TSH: 1.35 u[IU]/mL (ref 0.450–4.500)

## 2016-05-20 LAB — VITAMIN D 25 HYDROXY (VIT D DEFICIENCY, FRACTURES): VIT D 25 HYDROXY: 32.5 ng/mL (ref 30.0–100.0)

## 2016-05-20 LAB — VITAMIN B12: VITAMIN B 12: 364 pg/mL (ref 232–1245)

## 2016-05-20 NOTE — Progress Notes (Signed)
Notified pt by mychart

## 2016-06-30 ENCOUNTER — Ambulatory Visit
Admission: RE | Admit: 2016-06-30 | Discharge: 2016-06-30 | Disposition: A | Discharge status: Home / Self Care | Payer: 59 | Source: Ambulatory Visit | Attending: General Surgery | Admitting: General Surgery

## 2016-06-30 DIAGNOSIS — C50412 Malignant neoplasm of upper-outer quadrant of left female breast: Secondary | ICD-10-CM

## 2016-06-30 DIAGNOSIS — Z9889 Other specified postprocedural states: Secondary | ICD-10-CM | POA: Diagnosis not present

## 2016-06-30 DIAGNOSIS — Z853 Personal history of malignant neoplasm of breast: Secondary | ICD-10-CM | POA: Diagnosis not present

## 2016-06-30 HISTORY — DX: Personal history of antineoplastic chemotherapy: Z92.21

## 2016-06-30 HISTORY — DX: Personal history of irradiation: Z92.3

## 2016-07-08 ENCOUNTER — Ambulatory Visit (INDEPENDENT_AMBULATORY_CARE_PROVIDER_SITE_OTHER): Payer: 59 | Admitting: General Surgery

## 2016-07-08 ENCOUNTER — Encounter: Payer: Self-pay | Admitting: General Surgery

## 2016-07-08 VITALS — BP 116/66 | HR 84 | Resp 14 | Ht 65.0 in | Wt 201.0 lb

## 2016-07-08 DIAGNOSIS — C50811 Malignant neoplasm of overlapping sites of right female breast: Secondary | ICD-10-CM | POA: Diagnosis not present

## 2016-07-08 DIAGNOSIS — Z171 Estrogen receptor negative status [ER-]: Secondary | ICD-10-CM

## 2016-07-08 NOTE — Progress Notes (Signed)
Patient ID: Adrienne [name], female   DOB: [birth year], 56 y.o.   MRN: [id number]  Chief Complaint  Patient presents with  . Follow-up    mammogram    HPI Adrienne [name] is a 56 y.o. female.  who presents for follow up breast cancer and a breast evaluation. The most recent mammogram was done on 06-30-16 .  Patient does perform regular self breast checks and gets regular mammograms done.   No new breast issues.  HPI  Past Medical History:  Diagnosis Date  . Anemia   . Asthma   . BRCA negative 09/22/2014  . Breast cancer ([place]) 2012   LT LUMPECTOMY  . Breast cancer ([place]) 2016   RT LUMPECTOMY  . Breast cyst 2010   cyst- drained  . Breast screening, unspecified   . Constipation   . Dry eyes 2012  . GERD (gastroesophageal reflux disease)   . Hematuria   . Kidney stone   . Lump or mass in breast   . Malignant neoplasm of breast (female), unspecified site 07/08/2014   Right breast, 5 mm, T1a,N0, triple negative; high risk for recurrence on Mammoprint.  . Malignant neoplasm of upper-outer quadrant of female breast ([place]) 05/08/2010   Left breast: T1b, N0, M); triple negative, DCIS present. No chemotherapy. MammoSite radiation.  . Neuropathy    pt states in fingertips and feet  . Obesity, unspecified   . Pancreatic lesion   . Personal history of chemotherapy 2016   BREAST CA  . 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 Personal history of radiation therapy 2012   BREAST CA - MAMMOSITE  . Personal history of radiation therapy 2016   BREAST CA - MAMMOSITE  . Screening for obesity   . Seasonal allergies   . Special screening for malignant neoplasms, colon   . Unspecified essential hypertension     Past Surgical History:  Procedure Laterality Date  . BREAST BIOPSY    . BREAST CYST ASPIRATION  Left 2010  . BREAST LUMPECTOMY Left 2012   BREAST CA  . BREAST LUMPECTOMY Right 2016   BREAST CA  . BREAST LUMPECTOMY WITH AXILLARY LYMPH NODE BIOPSY Right 08/01/2014   Procedure: BREAST LUMPECTOMY WITH AXILLARY LYMPH NODE BIOPSY;  Surgeon: Robert Bellow, MD;  Location: [facility] ORS;  Service: General;  Laterality: Right;  . BREAST MAMMOSITE  April 2012   mammosite placement and removal   . BREAST SURGERY Left 2012   left breast wide excision, sentinel node, MammoSite  . BREAST SURGERY Right 08/06/2014   Wide excision/sentinel node biopsy, T1a, N0 triple negative, MammoSite  . CESAREAN SECTION  1988  . COLONOSCOPY  October 17, 2009   Dr. Dionne Milo, normal study.  Marland Kitchen KIDNEY STONE SURGERY  2003   stones removed by Dr. Quillian Quince- stent placed/removed  . PORTACATH PLACEMENT Left 11/09/2014   Procedure: INSERTION PORT-A-CATH;  Surgeon: Robert Bellow, MD;  Location: [facility] ORS;  Service: General;  Laterality: Left;  . SENTINEL NODE BIOPSY Right 08/01/2014   Procedure: SENTINEL NODE BIOPSY;  Surgeon: Robert Bellow, MD;  Location: [facility] ORS;  Service: General;  Laterality: Right;  . TUBAL LIGATION      Family History  Problem Relation Age of Onset  . Ovarian cancer Mother   . Diabetes Mother   . Cancer  Father     prostate  . Diabetes Father   . Hypertension Brother   . Cancer Maternal Grandmother     bone  . Cancer Maternal Grandfather     lung  . Cancer Paternal Grandmother     lung  . Asthma Paternal Grandfather   . Hypertension Sister   . Breast cancer Maternal Aunt 60    Social History Social History  Substance Use Topics  . Smoking status: Never Smoker  . Smokeless tobacco: Never Used  . Alcohol use Yes     Comment: occasionally    Allergies  Allergen Reactions  . Cayenne Pepper [Cayenne] Shortness Of Breath  . Shellfish Allergy Other (See Comments)    Hives and swelling on ears and feet  . Tape Rash    Surgical tape from breast biopsy    Current Outpatient  Prescriptions  Medication Sig Dispense Refill  . albuterol (PROVENTIL HFA;VENTOLIN HFA) 108 (90 BASE) MCG/ACT inhaler Inhale 2 puffs into the lungs every 6 (six) hours as needed for wheezing or shortness of breath.    Marland Kitchen aspirin 81 MG tablet Take 81 mg by mouth daily.    . bisacodyl (DULCOLAX) 5 MG EC tablet Take 5 mg by mouth daily as needed for moderate constipation.    . Cholecalciferol (VITAMIN D3) 1000 UNITS CAPS Take 1 capsule by mouth daily.     Marland Kitchen esomeprazole (NEXIUM) 40 MG capsule Take 40 mg by mouth 2 (two) times daily as needed (acid reflux).     . gabapentin (NEURONTIN) 300 MG capsule Take 2 capsules (600 mg total) by mouth 3 (three) times daily. 540 capsule 2  . lisinopril (ZESTRIL) 10 MG tablet Take 1 tablet (10 mg total) by mouth daily. 90 tablet 3  . loratadine (CLARITIN) 10 MG tablet Take 10 mg by mouth daily. Reported on 04/13/2015    . polyethylene glycol (MIRALAX / GLYCOLAX) packet Take 17 g by mouth every other day.     No current facility-administered medications for this visit.    Facility-Administered Medications Ordered in Other Visits  Medication Dose Route Frequency Provider Last Rate Last Dose  . sodium chloride 0.9 % injection 10 mL  10 mL Intravenous PRN Leia Alf, MD   10 mL at 11/10/14 1138    Review of Systems Review of Systems  Constitutional: Negative.   Respiratory: Negative.   Cardiovascular: Negative.     Blood pressure 116/66, pulse 84, resp. rate 14, height 5' 5"  (1.651 m), weight 201 lb (91.2 kg).  Physical Exam Physical Exam  Constitutional: She is oriented to person, place, and time. She appears well-developed and well-nourished.  HENT:  Mouth/Throat: Oropharynx is clear and moist.  Eyes: Conjunctivae are normal. No scleral icterus.  Neck: Neck supple.  Cardiovascular: Normal rate, regular rhythm and normal heart sounds.   Pulmonary/Chest: Effort normal and breath sounds normal. Right breast exhibits no inverted nipple, no mass, no  nipple discharge, no skin change and no tenderness. Left breast exhibits no inverted nipple, no mass, no nipple discharge, no skin change and no tenderness.    Thickening at port scar. Tender left upper outer scar and focal thickening at scar left breast.   Lymphadenopathy:    She has no cervical adenopathy.    She has no axillary adenopathy.  Neurological: She is alert and oriented to person, place, and time.  Skin: Skin is warm and dry.  Psychiatric: Her behavior is normal.    Data Reviewed 06/30/2016 bilateral diagnostic mammogram reviewed. Postsurgical  changes. BI-RADS-2. February 2018 medical oncology notes reviewed. By patient report previous BRCA testing was negative. Assessment    No evidence of recurrent cancer    Plan         The patient has been asked to return to the office in one year with a bilateral diagnostic mammogram.   HPI, Physical Exam, Assessment and Plan have been scribed under the direction and in the presence of Robert Bellow, MD.  Karie Fetch, RN I have completed the exam and reviewed the above documentation for accuracy and completeness.  I agree with the above.  Haematologist has been used and any errors in dictation or transcription are unintentional.  Hervey Ard, M.D., F.A.C.S.  Robert Bellow 07/08/2016, 12:55 PM

## 2016-07-08 NOTE — Patient Instructions (Signed)
The patient is aware to call back for any questions or concerns.  

## 2016-08-29 ENCOUNTER — Encounter: Payer: Self-pay | Admitting: Unknown Physician Specialty

## 2016-08-29 ENCOUNTER — Ambulatory Visit (INDEPENDENT_AMBULATORY_CARE_PROVIDER_SITE_OTHER): Payer: 59 | Admitting: Unknown Physician Specialty

## 2016-08-29 VITALS — BP 128/78 | HR 77 | Temp 98.2°F | Ht 65.1 in | Wt 202.2 lb

## 2016-08-29 DIAGNOSIS — C50912 Malignant neoplasm of unspecified site of left female breast: Secondary | ICD-10-CM

## 2016-08-29 DIAGNOSIS — I10 Essential (primary) hypertension: Secondary | ICD-10-CM | POA: Diagnosis not present

## 2016-08-29 DIAGNOSIS — K5909 Other constipation: Secondary | ICD-10-CM | POA: Diagnosis not present

## 2016-08-29 DIAGNOSIS — G6289 Other specified polyneuropathies: Secondary | ICD-10-CM

## 2016-08-29 DIAGNOSIS — R42 Dizziness and giddiness: Secondary | ICD-10-CM | POA: Insufficient documentation

## 2016-08-29 DIAGNOSIS — Z0000 Encounter for general adult medical examination without abnormal findings: Secondary | ICD-10-CM

## 2016-08-29 DIAGNOSIS — J4520 Mild intermittent asthma, uncomplicated: Secondary | ICD-10-CM

## 2016-08-29 DIAGNOSIS — J45909 Unspecified asthma, uncomplicated: Secondary | ICD-10-CM | POA: Insufficient documentation

## 2016-08-29 MED ORDER — ALBUTEROL SULFATE HFA 108 (90 BASE) MCG/ACT IN AERS: 2.0000 | INHALATION_SPRAY | Freq: Four times a day (QID) | RESPIRATORY_TRACT | 3 refills | Status: DC | PRN

## 2016-08-29 NOTE — Assessment & Plan Note (Signed)
Could be vertigo or dehydration. States she is likely not drinking enough water, encouraged her to use refillable bottle at work and drink 4 bottles per shift.

## 2016-08-29 NOTE — Assessment & Plan Note (Signed)
Large nodule palpable in left breast. Patient followed by oncology with no changes from previous exam.

## 2016-08-29 NOTE — Assessment & Plan Note (Signed)
Over past 6 months has used Metamucil for increased bowel movement frequency and softening. Treatment has been effective. Again, encouraged more water intake and high fiber diet.

## 2016-08-29 NOTE — Assessment & Plan Note (Signed)
Refill inhaler

## 2016-08-29 NOTE — Assessment & Plan Note (Signed)
Numbness has not changed. She was previously unable to tolerate Neurontin due to lethargy. Is not interested in restarting it at a lower dose at this time. She says the neuropathy is tolerable.

## 2016-08-29 NOTE — Assessment & Plan Note (Signed)
Checks blood pressure at home with readings between 110-120/70-80's. Blood pressure stable today on Lisinopril.

## 2016-08-29 NOTE — Progress Notes (Signed)
BP 128/78   Pulse 77   Temp 98.2 F (36.8 C)   Ht 5' 5.1" (1.654 m)   Wt 202 lb 3.2 oz (91.7 kg)   LMP  (LMP Unknown)   SpO2 99%   BMI 33.54 kg/m    Subjective:    Patient ID: Adrienne [name], female    DOB: 01-18-1961, 56 y.o.   MRN: [id number]  HPI: Adrienne [name] is a 56 y.o. female  Chief Complaint  Patient presents with  . Annual Exam   Hypertension Using medications without difficulty Average home BPs 110-120/70-80's, stable today  Has episodes of dizziness and weakness approximately once a month that can last all day, comes on with activity and is resolved with rest. No chest pain with exertion or shortness of breath Has edema in her lower legs (left leg edema greater than right)  Hyperlipidemia Using medications without problems No muscle aches  Diet compliance: eats whatever she wants, but limits sugars and carbohydrates Exercise: new job that requires her to walk 6,000 steps per shift  Peripheral Neuropathy Chronic, persistent numbness in fingertips and bilateral feet (left foot numbness greater than right)  Asthma Uses inhaler intermittenlty   Relevant past medical, surgical, family and social history reviewed and updated as indicated. Interim medical history since our last visit reviewed. Allergies and medications reviewed and updated.  Review of Systems  Eyes: Negative for visual disturbance.  Respiratory: Negative for shortness of breath.   Cardiovascular: Negative for chest pain.  Gastrointestinal: Positive for constipation. Negative for abdominal pain.  Neurological: Positive for dizziness, weakness and numbness. Negative for syncope and headaches.    Per HPI unless specifically indicated above     Objective:    BP 128/78   Pulse 77   Temp 98.2 F (36.8 C)   Ht 5' 5.1" (1.654 m)   Wt 202 lb 3.2 oz (91.7 kg)   LMP  (LMP Unknown)   SpO2 99%   BMI 33.54 kg/m   Wt Readings from Last 3 Encounters:  08/29/16 202 lb 3.2 oz (91.7 kg)    07/08/16 201 lb (91.2 kg)  05/19/16 199 lb 12.8 oz (90.6 kg)    Physical Exam  Constitutional: She is oriented to person, place, and time. She appears well-developed and well-nourished.  Eyes: Pupils are equal, round, and reactive to light.  Neck: Carotid bruit is not present.  Cardiovascular: Normal rate, regular rhythm, S1 normal, S2 normal, normal heart sounds and intact distal pulses.   Pulses:      Radial pulses are 2+ on the right side, and 2+ on the left side.       Dorsalis pedis pulses are 2+ on the right side, and 2+ on the left side.  Mild, non-pitting edema present in bilateral lower extremities from mid shin to ankle, left greater than right.  Pulmonary/Chest: Effort normal and breath sounds normal. Left breast exhibits mass and tenderness.    Large palpable nodule on left lateral side of breast, painful on palpation, corresponds to the calcified area on mammogram  Abdominal: Soft. Bowel sounds are normal.  Musculoskeletal: Normal range of motion. She exhibits edema.  Neurological: She is alert and oriented to person, place, and time. She has normal strength. A sensory deficit is present.  Reflex Scores:      Patellar reflexes are 2+ on the right side and 2+ on the left side. Numbness in bilateral finger tips and feet with left foot numbness greater than right  Skin: Skin is  warm, dry and intact.  Psychiatric: She has a normal mood and affect. Her behavior is normal.    Results for orders placed or performed in visit on 05/19/16  TSH  Result Value Ref Range   TSH 1.350 0.450 - 4.500 uIU/mL  Lipid Panel w/o Chol/HDL Ratio  Result Value Ref Range   Cholesterol, Total 179 100 - 199 mg/dL   Triglycerides 66 0 - 149 mg/dL   HDL 67 >39 mg/dL   VLDL Cholesterol Cal 13 5 - 40 mg/dL   LDL Calculated 99 0 - 99 mg/dL  Vitamin B12  Result Value Ref Range   Vitamin B-12 364 232 - 1,245 pg/mL  VITAMIN D 25 Hydroxy (Vit-D Deficiency, Fractures)  Result Value Ref Range   Vit  D, 25-Hydroxy 32.5 30.0 - 100.0 ng/mL      Assessment & Plan:   Problem List Items Addressed This Visit      Unprioritized   Asthma    Refill inhaler      Relevant Medications   albuterol (PROVENTIL HFA;VENTOLIN HFA) 108 (90 Base) MCG/ACT inhaler   Breast cancer, left breast (HCC)    Large nodule palpable in left breast. Patient followed by oncology with no changes from previous exam.       Chronic constipation    Over past 6 months has used Metamucil for increased bowel movement frequency and softening. Treatment has been effective. Again, encouraged more water intake and high fiber diet.       Dizzy spells    Could be vertigo or dehydration. States she is likely not drinking enough water, encouraged her to use refillable bottle at work and drink 4 bottles per shift.        Hypertension    Checks blood pressure at home with readings between 110-120/70-80's. Blood pressure stable today on Lisinopril.      Peripheral neuropathy    Numbness has not changed. She was previously unable to tolerate Neurontin due to lethargy. Is not interested in restarting it at a lower dose at this time. She says the neuropathy is tolerable.        Other Visit Diagnoses    Annual physical exam    -  Primary       Follow up plan: Return in about 6 months (around 02/28/2017).

## 2016-09-01 ENCOUNTER — Ambulatory Visit: Payer: 59 | Admitting: Internal Medicine

## 2016-09-05 ENCOUNTER — Inpatient Hospital Stay: Payer: 59 | Attending: Internal Medicine | Admitting: Internal Medicine

## 2016-09-05 ENCOUNTER — Other Ambulatory Visit: Payer: Self-pay | Admitting: Internal Medicine

## 2016-09-05 VITALS — BP 106/67 | HR 67 | Temp 96.2°F | Resp 16 | Wt 201.5 lb

## 2016-09-05 DIAGNOSIS — Z79899 Other long term (current) drug therapy: Secondary | ICD-10-CM

## 2016-09-05 DIAGNOSIS — G629 Polyneuropathy, unspecified: Secondary | ICD-10-CM

## 2016-09-05 DIAGNOSIS — D472 Monoclonal gammopathy: Secondary | ICD-10-CM | POA: Diagnosis not present

## 2016-09-05 DIAGNOSIS — Z9221 Personal history of antineoplastic chemotherapy: Secondary | ICD-10-CM | POA: Diagnosis not present

## 2016-09-05 DIAGNOSIS — J45909 Unspecified asthma, uncomplicated: Secondary | ICD-10-CM | POA: Insufficient documentation

## 2016-09-05 DIAGNOSIS — E669 Obesity, unspecified: Secondary | ICD-10-CM | POA: Diagnosis not present

## 2016-09-05 DIAGNOSIS — Z7982 Long term (current) use of aspirin: Secondary | ICD-10-CM | POA: Insufficient documentation

## 2016-09-05 DIAGNOSIS — Z171 Estrogen receptor negative status [ER-]: Secondary | ICD-10-CM | POA: Diagnosis not present

## 2016-09-05 DIAGNOSIS — K219 Gastro-esophageal reflux disease without esophagitis: Secondary | ICD-10-CM | POA: Diagnosis not present

## 2016-09-05 DIAGNOSIS — C50811 Malignant neoplasm of overlapping sites of right female breast: Secondary | ICD-10-CM

## 2016-09-05 DIAGNOSIS — Z853 Personal history of malignant neoplasm of breast: Secondary | ICD-10-CM | POA: Insufficient documentation

## 2016-09-05 DIAGNOSIS — I10 Essential (primary) hypertension: Secondary | ICD-10-CM | POA: Insufficient documentation

## 2016-09-05 DIAGNOSIS — R42 Dizziness and giddiness: Secondary | ICD-10-CM

## 2016-09-05 MED ORDER — MECLIZINE HCL 12.5 MG PO TABS: 12.5000 mg | ORAL_TABLET | Freq: Three times a day (TID) | ORAL | 0 refills | Status: DC | PRN

## 2016-09-05 NOTE — Progress Notes (Signed)
Patient here today for follow up.  Patient states no new concerns with breast today

## 2016-09-05 NOTE — Progress Notes (Signed)
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   # 2012 LEFT BREAST CA STAGE I s/p Lumpec [T1 (0.7CM);Triple Neg; G-3]; s/p mammosite RT; No adj chemo  #  Mild Anemia/M-protein 0.8gm/dl [sep 2016]  # BRCA- 1&2 NEGATIVE [july 2016]      Carcinoma of overlapping sites of right breast in female, estrogen receptor negative ([place])    INTERVAL HISTORY:  A pleasant 56 year old female patient with above history of stage I right-sided triple negative breast cancer - Is here for follow-up.  Overall patient is doing well. Denies any unusual headaches or vision changes. Continues to have chronic intermittent dizzy spells. Worse with position. Otherwise no lumps or bumps.   REVIEW OF SYSTEMS:  A complete 10 point review of system is done which is negative except mentioned above/history of present illness.   PAST MEDICAL HISTORY :  Past Medical History:  Diagnosis Date  . Anemia   . Asthma   . BRCA negative 09/22/2014  . Breast cancer ([place]) 2012   LT LUMPECTOMY  . Breast cancer ([place]) 2016   RT LUMPECTOMY  . Breast cyst 2010   cyst- drained  . Breast screening, unspecified   . Constipation   . Dry eyes 2012  . GERD (gastroesophageal reflux disease)   . Hematuria   . Kidney stone   . Lump or mass in breast   . Malignant neoplasm of breast (female), unspecified site 07/08/2014   Right breast, 5 mm, T1a,N0, triple negative; high risk for recurrence on Mammoprint.  . Malignant neoplasm of upper-outer quadrant of female breast ([place])  05/08/2010   Left breast: T1b, N0, M); triple negative, DCIS present. No chemotherapy. MammoSite radiation.  . Neuropathy    pt states in fingertips and feet  . Obesity, unspecified   . Pancreatic lesion   . Personal history of chemotherapy 2016   BREAST CA  . 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 Personal history of radiation therapy 2012   BREAST CA - MAMMOSITE  . Personal history of radiation therapy 2016   BREAST CA - MAMMOSITE  . Screening for obesity   . Seasonal allergies   . Special screening for malignant neoplasms, colon   . Unspecified essential hypertension     PAST SURGICAL HISTORY :   Past Surgical History:  Procedure Laterality Date  . BREAST BIOPSY    . BREAST CYST ASPIRATION Left 2010  . BREAST LUMPECTOMY Left 2012   BREAST CA  . BREAST LUMPECTOMY Right 2016   BREAST CA  . BREAST LUMPECTOMY WITH AXILLARY LYMPH NODE BIOPSY Right 08/01/2014   Procedure: BREAST LUMPECTOMY WITH AXILLARY LYMPH NODE BIOPSY;  Surgeon: Robert Bellow, MD;  Location: [facility] ORS;  Service: General;  Laterality: Right;  . BREAST MAMMOSITE  April 2012   mammosite placement and removal   . BREAST SURGERY Left 2012   left breast wide  excision, sentinel node, MammoSite  . BREAST SURGERY Right 08/06/2014   Wide excision/sentinel node biopsy, T1a, N0 triple negative, MammoSite  . CESAREAN SECTION  1988  . COLONOSCOPY  October 17, 2009   Dr. Dionne Milo, normal study.  Marland Kitchen KIDNEY STONE SURGERY  2003   stones removed by Dr. Quillian Quince- stent placed/removed  . PORTACATH PLACEMENT Left 11/09/2014   Procedure: INSERTION PORT-A-CATH;  Surgeon: Robert Bellow, MD;  Location: [facility] ORS;  Service: General;  Laterality: Left;  . SENTINEL NODE BIOPSY Right 08/01/2014   Procedure: SENTINEL NODE BIOPSY;  Surgeon: Robert Bellow, MD;  Location: [facility] ORS;  Service: General;  Laterality: Right;  . TUBAL LIGATION      FAMILY HISTORY :   Family History  Problem Relation Age of Onset  . Ovarian cancer Mother   . Diabetes Mother   . Cancer Father        prostate  . Diabetes Father   . Hypertension Brother   . Cancer Maternal Grandmother        bone  . Cancer Maternal Grandfather        lung  . Cancer Paternal Grandmother        lung  . Asthma Paternal Grandfather   . Hypertension Sister   . Breast cancer Maternal Aunt 60    SOCIAL HISTORY:   Social History  Substance Use Topics  . Smoking status: Never Smoker  . Smokeless tobacco: Never Used  . Alcohol use Yes     Comment: occasionally    ALLERGIES:  is allergic to cayenne pepper [cayenne]; shellfish allergy; and tape.  MEDICATIONS:  Current Outpatient Prescriptions  Medication Sig Dispense Refill  . albuterol (PROVENTIL HFA;VENTOLIN HFA) 108 (90 Base) MCG/ACT inhaler Inhale 2 puffs into the lungs every 6 (six) hours as needed for wheezing or shortness of breath. 1 Inhaler 3  . aspirin 81 MG tablet Take 81 mg by mouth daily.    . bisacodyl (DULCOLAX) 5 MG EC tablet Take 5 mg by mouth daily as needed for moderate constipation.    . Cholecalciferol (VITAMIN D3) 1000 UNITS CAPS Take 1 capsule by mouth daily.     Marland Kitchen esomeprazole (NEXIUM) 40 MG capsule Take 40 mg by mouth 2 (two) times daily as needed (acid reflux).     Marland Kitchen lisinopril (ZESTRIL) 10 MG tablet Take 1 tablet (10 mg total) by mouth daily. 90 tablet 3  . loratadine (CLARITIN) 10 MG tablet Take 10 mg by mouth daily. Reported on 04/13/2015    . polyethylene glycol (MIRALAX / GLYCOLAX) packet Take 17 g by mouth every other day.    . meclizine (ANTIVERT) 12.5 MG tablet Take 1 tablet (12.5 mg total) by mouth 3 (three) times daily as needed for dizziness. 40 tablet 0   No current facility-administered medications for this visit.    Facility-Administered Medications Ordered in Other Visits   Medication Dose Route Frequency Provider Last Rate Last Dose  . sodium chloride 0.9 % injection 10 mL  10 mL Intravenous PRN Leia Alf, MD   10 mL at 11/10/14 1138    PHYSICAL EXAMINATION: ECOG PERFORMANCE STATUS: 1 - Symptomatic but completely ambulatory  BP 106/67 (BP Location: Left Arm, Patient Position: Sitting)   Pulse 67   Temp (!) 96.2 F (35.7 C) (Tympanic)   Resp 16   Wt 201 lb 8 oz (91.4 kg)   LMP  (LMP Unknown)   BMI 33.43 kg/m   Filed Weights   09/05/16 1152  Weight: 201  lb 8 oz (91.4 kg)    GENERAL: Well-nourished well-developed; Alert, no distress and comfortable.   She is alone.  EYES: no pallor or icterus OROPHARYNX: no thrush or ulceration; good dentition  NECK: supple, no masses felt LYMPH:  no palpable lymphadenopathy in the cervical, axillary or inguinal regions LUNGS: clear to auscultation and  No wheeze or crackles HEART/CVS: regular rate & rhythm and no murmurs; No lower extremity edema ABDOMEN:abdomen soft, non-tender and normal bowel sounds Musculoskeletal:no cyanosis of digits and no clubbing  PSYCH: alert & oriented x 3 with fluent speech NEURO: no focal motor/sensory deficits SKIN:  no rashes or significant lesions.   LABORATORY DATA:  I have reviewed the data as listed    Component Value Date/Time   NA 138 04/28/2016 0958   NA 140 07/16/2011 2130   K 3.6 04/28/2016 0958   K 3.7 07/16/2011 2130   CL 107 04/28/2016 0958   CL 106 07/16/2011 2130   CO2 25 04/28/2016 0958   CO2 27 07/16/2011 2130   GLUCOSE 100 (H) 04/28/2016 0958   GLUCOSE 93 07/16/2011 2130   BUN 21 (H) 04/28/2016 0958   BUN 16 07/16/2011 2130   CREATININE 0.80 04/28/2016 0958   CREATININE 0.95 07/16/2011 2130   CALCIUM 9.2 04/28/2016 0958   CALCIUM 9.0 07/16/2011 2130   PROT 7.8 04/28/2016 0958   ALBUMIN 3.8 04/28/2016 0958   AST 18 04/28/2016 0958   ALT 16 04/28/2016 0958   ALKPHOS 79 04/28/2016 0958   BILITOT 0.6 04/28/2016 0958   GFRNONAA >60 04/28/2016  0958   GFRNONAA >60 07/16/2011 2130   GFRAA >60 04/28/2016 0958   GFRAA >60 07/16/2011 2130    No results found for: SPEP, UPEP  Lab Results  Component Value Date   WBC 5.3 04/28/2016   NEUTROABS 3.4 04/28/2016   HGB 10.7 (L) 04/28/2016   HCT 31.5 (L) 04/28/2016   MCV 95.0 04/28/2016   PLT 210 04/28/2016      Chemistry      Component Value Date/Time   NA 138 04/28/2016 0958   NA 140 07/16/2011 2130   K 3.6 04/28/2016 0958   K 3.7 07/16/2011 2130   CL 107 04/28/2016 0958   CL 106 07/16/2011 2130   CO2 25 04/28/2016 0958   CO2 27 07/16/2011 2130   BUN 21 (H) 04/28/2016 0958   BUN 16 07/16/2011 2130   CREATININE 0.80 04/28/2016 0958   CREATININE 0.95 07/16/2011 2130      Component Value Date/Time   CALCIUM 9.2 04/28/2016 0958   CALCIUM 9.0 07/16/2011 2130   ALKPHOS 79 04/28/2016 0958   AST 18 04/28/2016 0958   ALT 16 04/28/2016 0958   BILITOT 0.6 04/28/2016 0958         ASSESSMENT & PLAN:   Carcinoma of overlapping sites of right breast in female, estrogen receptor negative ([place]) # RIGHT BREAST CANCER STAGE I - Triple negative s/p adjuvant AC x 4 followed byTaxol weekly times 8 [stopped sec to PN in Jan 2017]. No concerns for recurrence. Recent mammogram April 2018 within normal limits.  # Peripheral neuropathy- sec to chemo. grade 1-2/improving. Continue monitoring.  # Chronic intermittent dizziness/vertigo- recommend evaluation with ENT for benign paroxysmal positional vertigo. 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 Will repeat M protein/kapp-lamda light chain in 6 months  # follow up in 6 months with labs.     Carcinoma of overlapping sites of right breast in female, estrogen receptor negative ([place]) #  RIGHT BREAST CANCER STAGE I - Triple negative s/p adjuvant AC x 4 followed byTaxol weekly times 8 [stopped sec to PN in Jan 2017]. No concerns for recurrence. Recent mammogram April 2018 within normal limits.  # Peripheral  neuropathy- sec to chemo. grade 1-2/improving. Continue monitoring.  # Chronic intermittent dizziness/vertigo- recommend evaluation with ENT for benign paroxysmal positional vertigo. 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 Will repeat M protein/kapp-lamda light chain in 6 months  # follow up in 6 months with labs.        Cammie Sickle, MD 09/05/2016 1:22 PM

## 2016-09-05 NOTE — Assessment & Plan Note (Addendum)
#   RIGHT BREAST CANCER STAGE I - Triple negative s/p adjuvant AC x 4 followed byTaxol weekly times 8 [stopped sec to PN in Jan 2017]. No concerns for recurrence. Recent mammogram April 2018 within normal limits.  # Peripheral neuropathy- sec to chemo. grade 1-2/improving. Continue monitoring.  # Chronic intermittent dizziness/vertigo- recommend evaluation with ENT for benign paroxysmal positional vertigo. 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 Will repeat M protein/kapp-lamda light chain in 6 months  # follow up in 6 months with labs.

## 2016-10-20 ENCOUNTER — Ambulatory Visit: Payer: Self-pay | Admitting: Radiation Oncology

## 2016-11-01 ENCOUNTER — Other Ambulatory Visit: Payer: Self-pay | Admitting: Internal Medicine

## 2016-11-01 DIAGNOSIS — I10 Essential (primary) hypertension: Secondary | ICD-10-CM

## 2016-11-03 ENCOUNTER — Encounter: Payer: Self-pay | Admitting: Radiation Oncology

## 2016-11-03 ENCOUNTER — Ambulatory Visit
Admission: RE | Admit: 2016-11-03 | Discharge: 2016-11-03 | Disposition: A | Discharge status: Home / Self Care | Payer: 59 | Source: Ambulatory Visit | Attending: Radiation Oncology | Admitting: Radiation Oncology

## 2016-11-03 VITALS — BP 138/81 | HR 71 | Temp 96.7°F | Resp 18 | Wt 208.7 lb

## 2016-11-03 DIAGNOSIS — C50411 Malignant neoplasm of upper-outer quadrant of right female breast: Secondary | ICD-10-CM

## 2016-11-03 DIAGNOSIS — Z853 Personal history of malignant neoplasm of breast: Secondary | ICD-10-CM | POA: Diagnosis not present

## 2016-11-03 DIAGNOSIS — Z923 Personal history of irradiation: Secondary | ICD-10-CM | POA: Insufficient documentation

## 2016-11-03 DIAGNOSIS — Z171 Estrogen receptor negative status [ER-]: Secondary | ICD-10-CM

## 2016-11-03 NOTE — Progress Notes (Signed)
Radiation Oncology Follow up Note  Name: Adrienne [name]   Date:   11/03/2016 MRN:  [id number] DOB: January 24, 1961    This 56 y.o. female presents to the clinic today for 2 year follow-up status post accelerated partial breast irradiation to bilateral breasts. She is out 2 year since completion. Accelerated partial breast irradiation to her right breast and 5 years since same treatment to her left breast  REFERRING PROVIDER: Robert Bellow, MD  HPI: Patient is a 56 year old female now out 2 years have included accelerated partial breast radiation to her right breast and 5 years since completion of same procedure to her left breast. Her left breast was triple negative stage I invasive mammary carcinoma.. Tumors were ER/PR negative she is not on antiestrogen therapy. Last mammograms were back in April 2018 BI-RADS 2 benign which I have reviewed  COMPLICATIONS OF TREATMENT: none  FOLLOW UP COMPLIANCE: keeps appointments   PHYSICAL EXAM:  BP 138/81   Pulse 71   Temp (!) 96.7 F (35.9 C)   Resp 18   Wt 208 lb 10.7 oz (94.7 kg)   LMP  (LMP Unknown)   BMI 34.62 kg/m  Lungs are clear to A&P cardiac examination essentially unremarkable with regular rate and rhythm. No dominant mass or nodularity is noted in either breast in 2 positions examined. Incision is well-healed. No axillary or supraclavicular adenopathy is appreciated. Cosmetic result is excellent. Well-developed well-nourished patient in NAD. HEENT reveals PERLA, EOMI, discs not visualized.  Oral cavity is clear. No oral mucosal lesions are identified. Neck is clear without evidence of cervical or supraclavicular adenopathy. Lungs are clear to A&P. Cardiac examination is essentially unremarkable with regular rate and rhythm without murmur rub or thrill. Abdomen is benign with no organomegaly or masses noted. Motor sensory and DTR levels are equal and symmetric in the upper and lower extremities. Cranial nerves II through XII are grossly  intact. Proprioception is intact. No peripheral adenopathy or edema is identified. No motor or sensory levels are noted. Crude visual fields are within normal range.  RADIOLOGY RESULTS: Bilateral mammograms are reviewed and compatible with the above-stated findings  PLAN: Present time she continues to do well with no evidence of disease in either breast. I'm please were overall progress. I've asked to see her back in 1 year for follow-up. Patient knows to call with any concerns.  I would like to take this opportunity to thank you for allowing me to participate in the care of your patient.Armstead Peaks., MD

## 2016-11-24 ENCOUNTER — Ambulatory Visit: Payer: 59 | Admitting: Unknown Physician Specialty

## 2016-12-04 IMAGING — CT CT ABDOMEN AND PELVIS WITHOUT AND WITH CONTRAST
2 of 10 series · 10 of 46 positions shown, 16 images · IV contrast (omnipaque)
Comparison: None.

CLINICAL DATA: Low back and right flank pain and urinary frequency.
Microscopic hematuria found on physical examination 06/19/2014.

EXAM:
CT ABDOMEN AND PELVIS WITHOUT AND WITH CONTRAST
TECHNIQUE: Multidetector CT imaging of the abdomen and pelvis was performed
following the standard protocol before and following the bolus
administration of intravenous contrast.
CONTRAST:  125 cc Omnipaque 300

[Series 2: hematuria > 45 wo · axial · 0.65mm/px · z∈[-888,-558]mm · 8 of 86 slices shown, 13 images]
[im 10/86  soft-tissue]
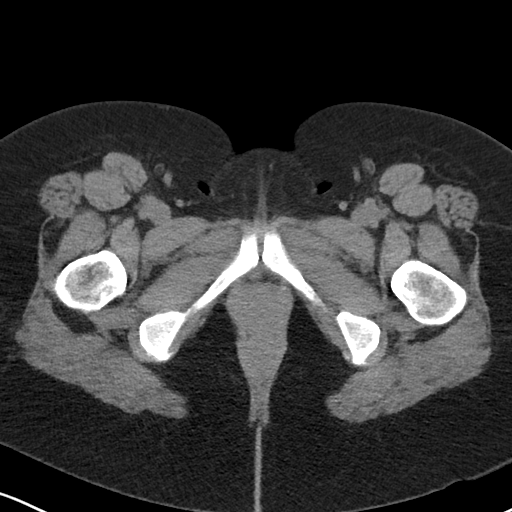
[im 10/86  bone]
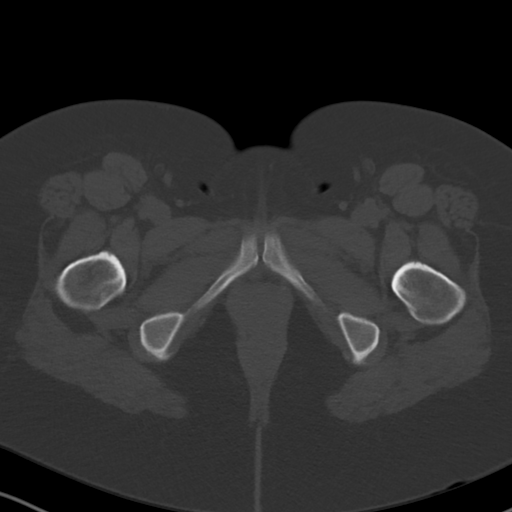
[im 19/86  soft-tissue]
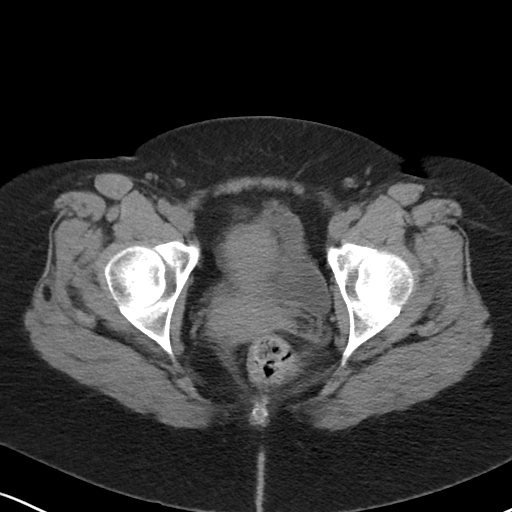
[im 29/86  soft-tissue]
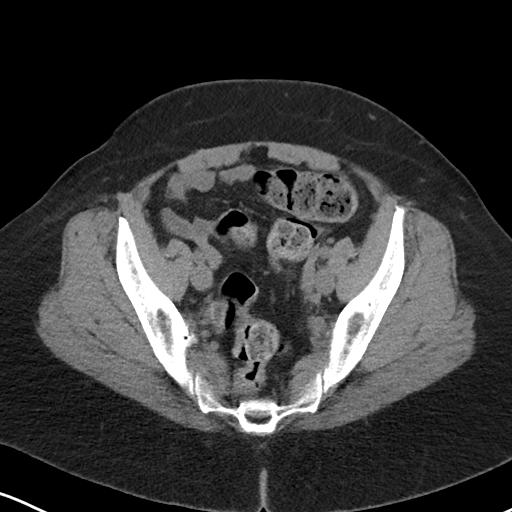
[im 38/86  soft-tissue]
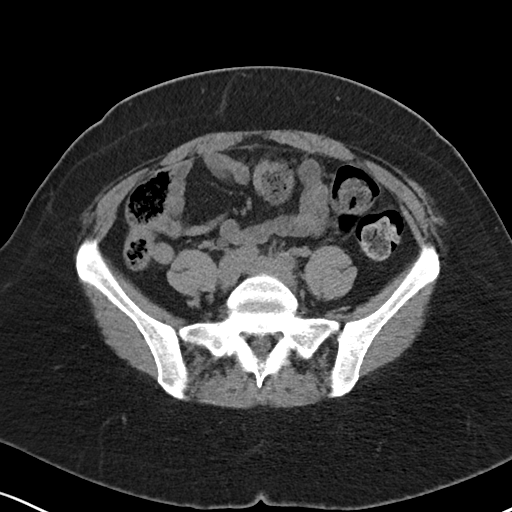
[im 48/86  soft-tissue]
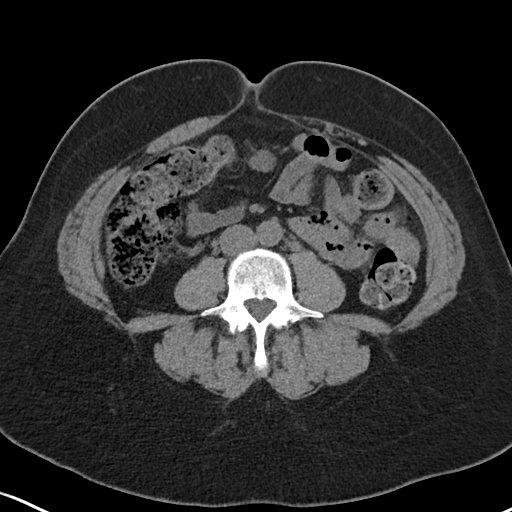
[im 48/86  lung]
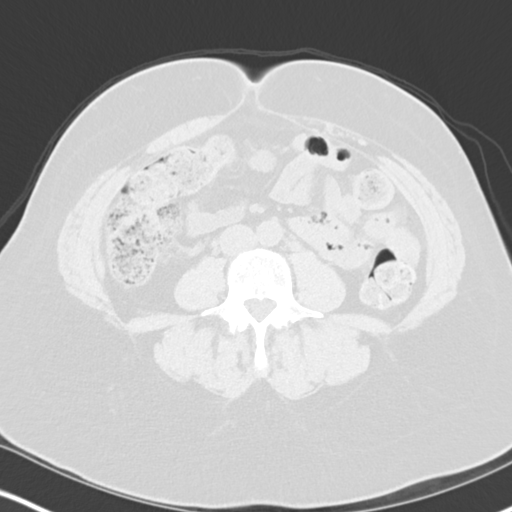
[im 57/86  soft-tissue]
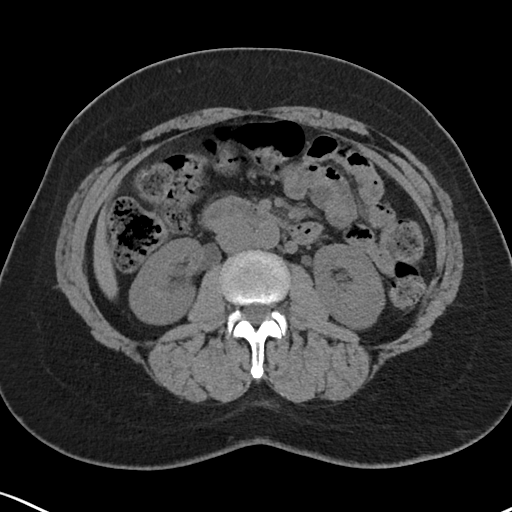
[im 57/86  lung]
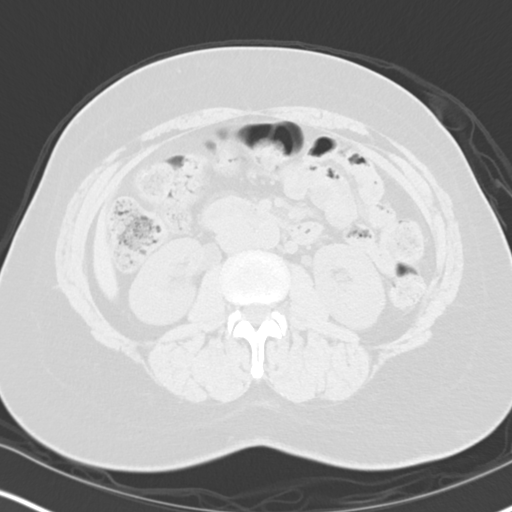
[im 67/86  soft-tissue]
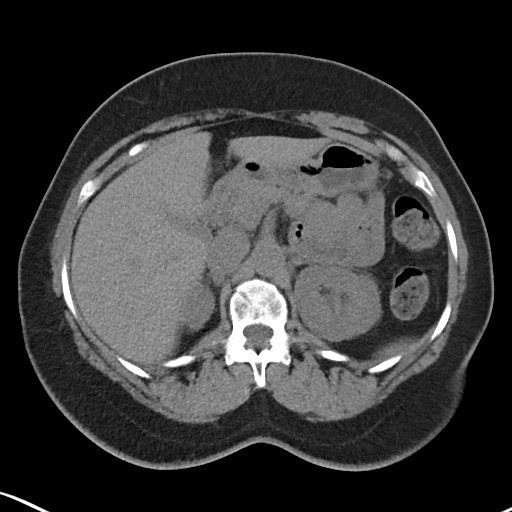
[im 67/86  lung]
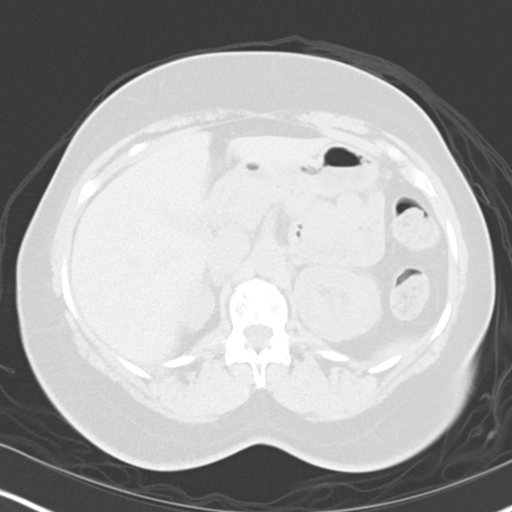
[im 76/86  soft-tissue]
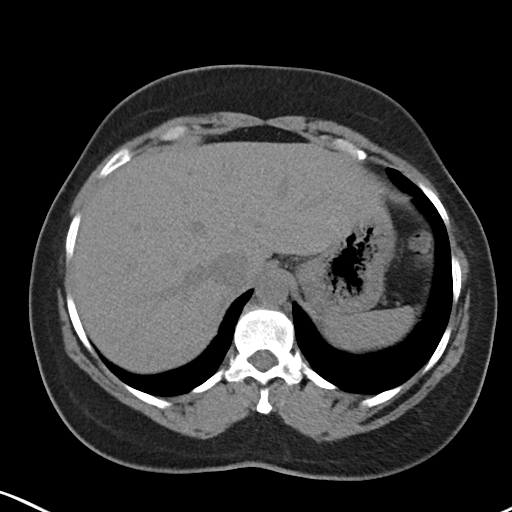
[im 76/86  lung]
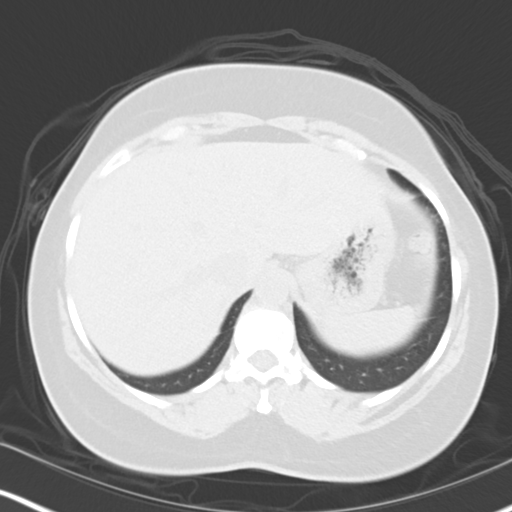

[Series 9: cor hematuria > 45 wo · coronal · 0.68mm/px · 2 of 144 slices shown, 3 images]
[im 48/144  soft-tissue]
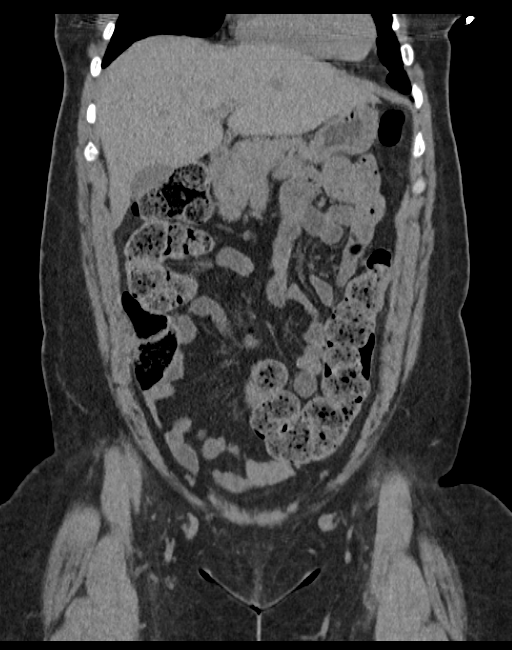
[im 48/144  bone]
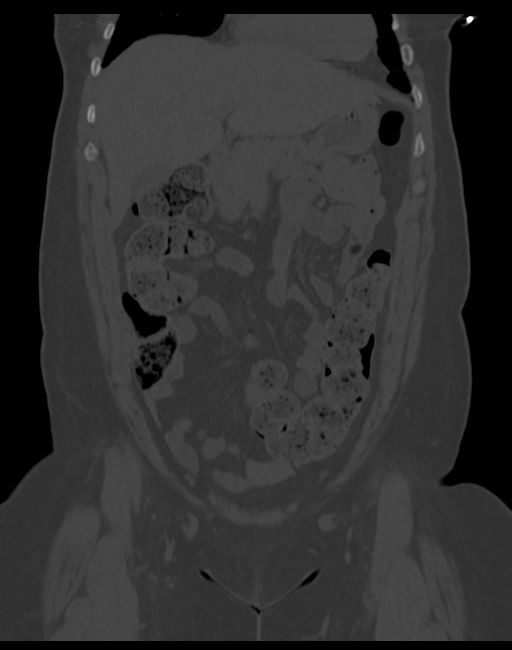
[im 96/144  soft-tissue]
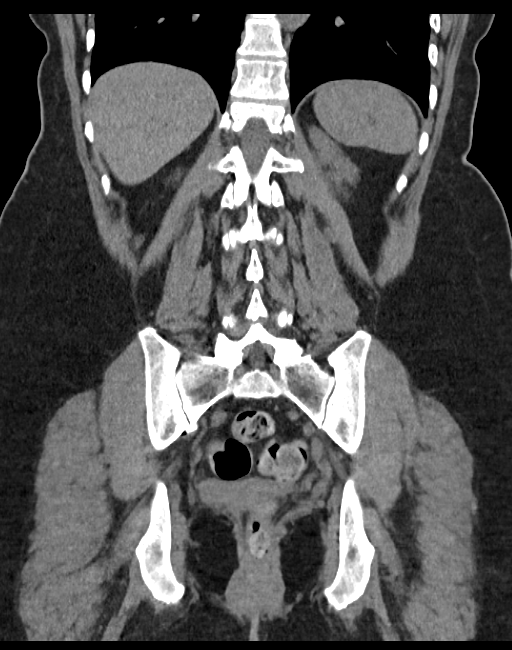

[10 of 46 positions shown; findings below may reference images not displayed]

FINDINGS: Lower chest: The lung bases are clear of acute process. No pleural
effusion or pulmonary lesions. The heart is normal in size. No
pericardial effusion. The distal esophagus and aorta are
unremarkable.

Hepatobiliary: No focal hepatic lesions or intrahepatic biliary
dilatation. The gallbladder is normal. No common bile duct
dilatation.

Pancreas: Tiny low-attenuation lesion in the pancreatic head
measuring 3 mm. This is likely a benign cyst but will require
follow-up. A repeat pancreatic protocol CT scan in 6 months is
suggested.

Spleen: No focal splenic lesions.  Normal size.

Adrenals/Urinary Tract: The adrenal glands are normal. There are
small bilateral renal calculi but no obstructing ureteral calculi or
bladder calculi. Both kidneys demonstrate normal
enhancement/perfusion. Small right renal cysts are noted. No
worrisome renal mass lesions. No bladder mass.

Stomach/Bowel: The stomach, duodenum, small bowel and colon are
grossly normal without oral contrast. No inflammatory changes, mass
lesions or obstructive findings. There is a moderate to large amount
of stool throughout the colon which may suggest constipation. The
terminal ileum is normal. The appendix is normal.

Vascular/Lymphatic: No mesenteric or retroperitoneal mass or
adenopathy. The aorta is normal in caliber. The branch vessels are
normal. The major venous structures are patent.

Other: The uterus and ovaries are normal. No pelvic mass or
adenopathy. No free pelvic fluid collections. No inguinal mass or
adenopathy.

Musculoskeletal: No significant bony findings.
IMPRESSION: 1. Bilateral renal calculi. The largest calculus is in the upper
pole region of the right kidney measures 6 mm. No obstructing
ureteral calculi or bladder calculi. No renal or bladder mass.
2. 3.5 mm low-attenuation lesion in the pancreatic could be a small
cyst or focal fat. Recommend repeat dedicated pancreatic CT scan in
6 months.
3. No acute abdominal/pelvic findings, mass lesions or
lymphadenopathy.
4. Moderate stool throughout the colon may suggest constipation.

## 2016-12-12 IMAGING — MG MM POST US BIOPSY*R*
2 series · 2 of 2 positions shown · non-contrast
Comparison: Previous exam(s).

CLINICAL DATA: 54-year-old female -evaluate clip placement
following ultrasound-guided right breast biopsy.

EXAM:
DIAGNOSTIC RIGHT MAMMOGRAM POST ULTRASOUND BIOPSY

[R CC]
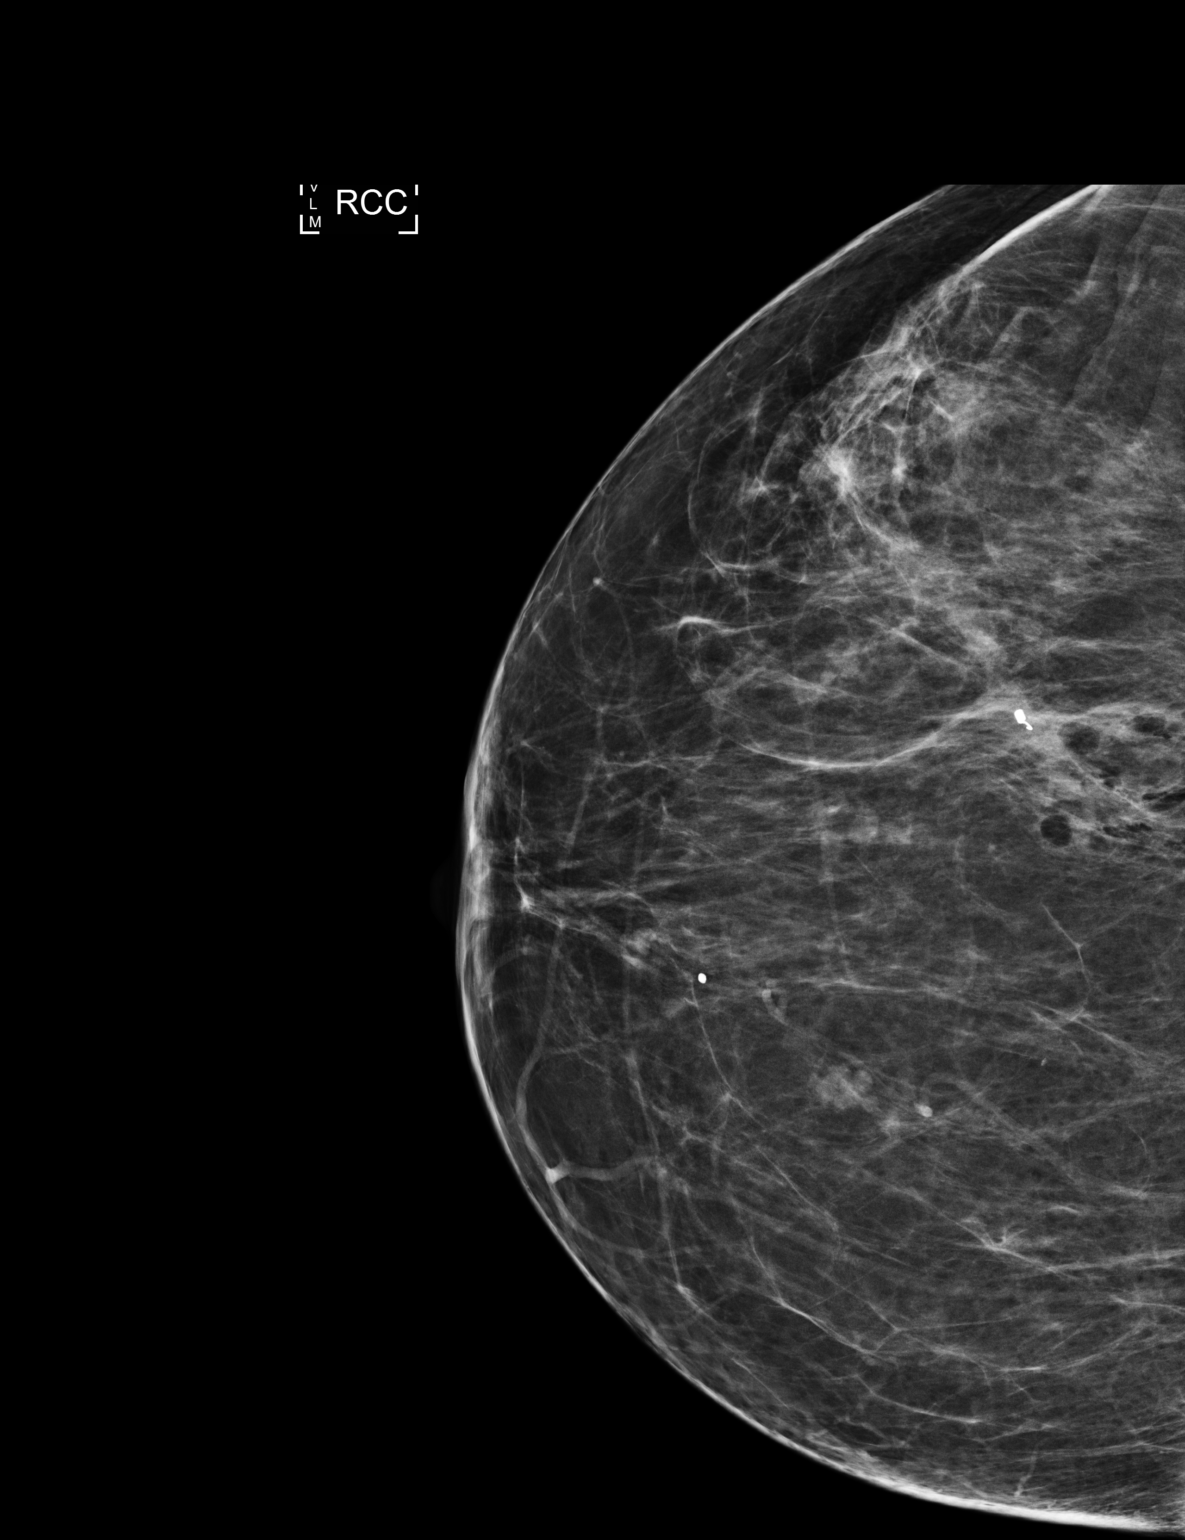

[R ML]
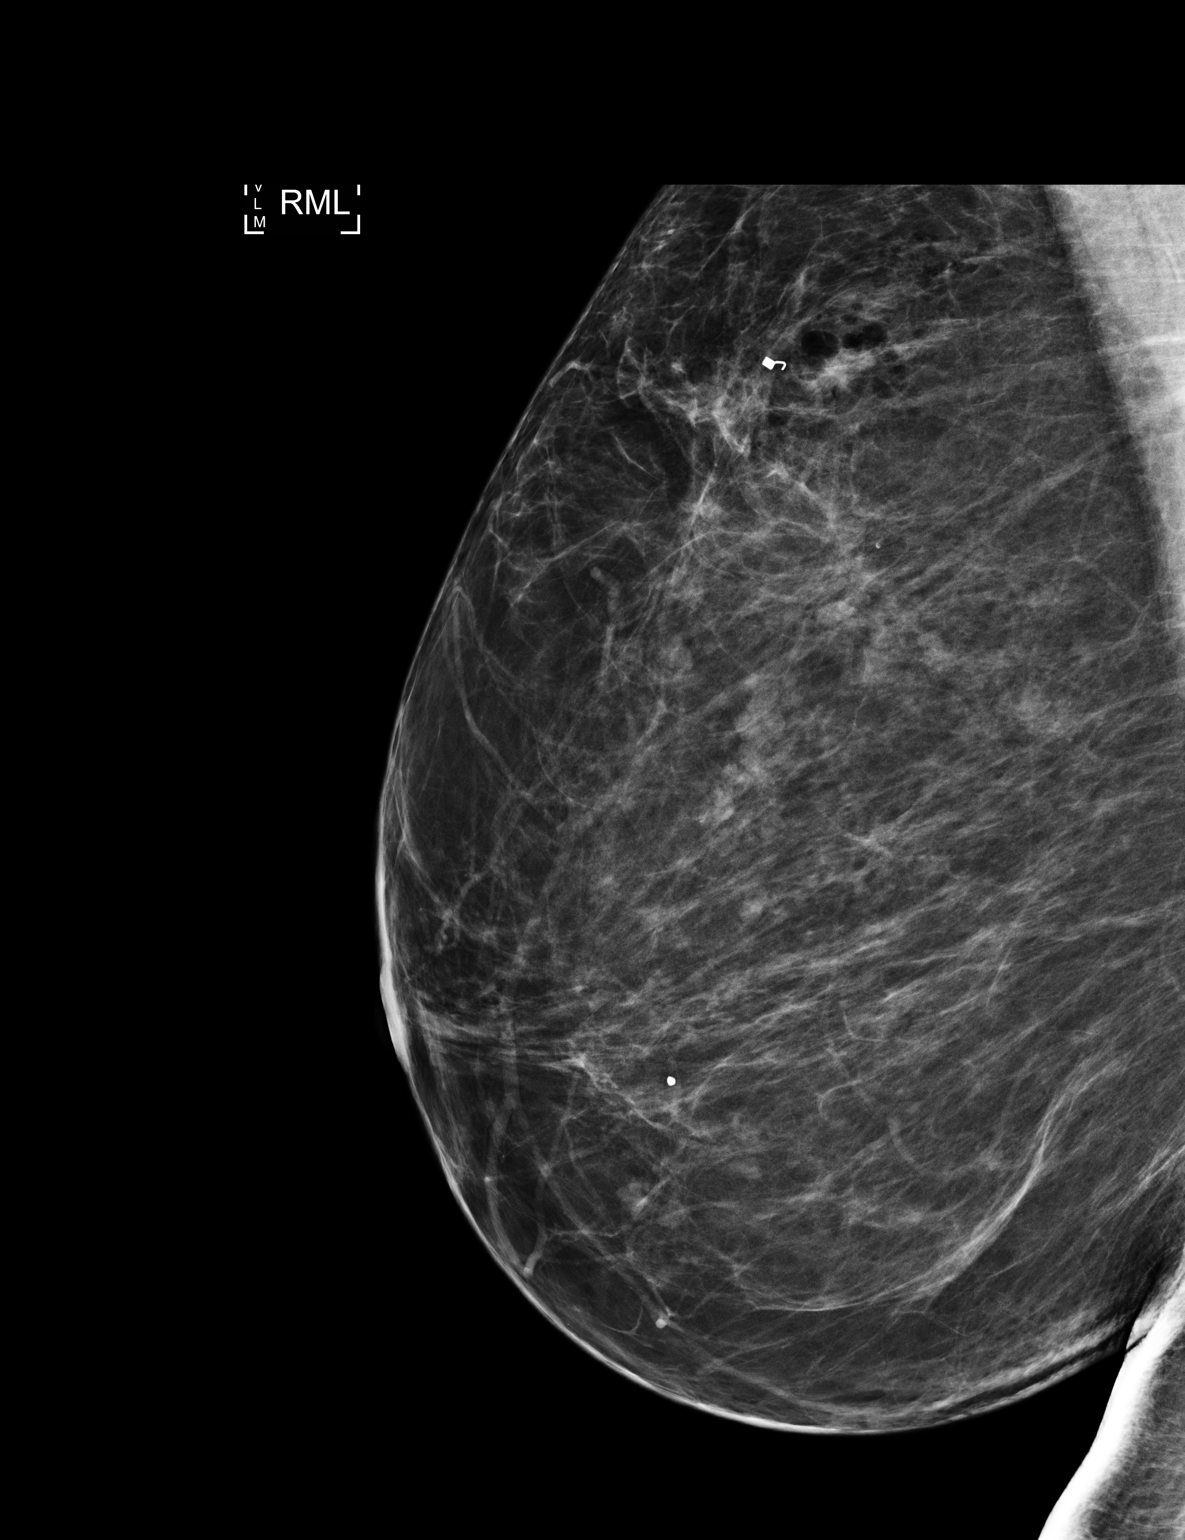

[2 of 2 positions shown; findings below may reference images not displayed]

FINDINGS: Mammographic images were obtained following ultrasound guided biopsy
of the 6 mm hypoechoic mass at the 11 o'clock position of the right
breast 9 cm from the nipple..

The coil shaped clip is in satisfactory position.
IMPRESSION: Satisfactory clip position following ultrasound-guided right breast
biopsy.

Final Assessment: Post Procedure Mammograms for Marker Placement

## 2016-12-12 IMAGING — US US BIOPSY BREAST CORE W/ IMAGING
1 series · 9 of 9 positions shown · non-contrast
Comparison: Previous exams.

ADDENDUM:
Pathology results: Pathology results from the ultrasound-guided
biopsy of the mass in the upper-outer right breast revealed grade 3
invasive mammary carcinoma. This is concordant with the imaging
findings. The patient has been notified of the results by Dr.
Sarabjit. She is doing well and denies any biopsy site complications.

She will follow-up with Dr. Sarabjit to discuss her surgical options.
The patient has been instructed to call the [REDACTED] with any
questions or concerns.
CLINICAL DATA: 54-year-old female for tissue sampling of suspicious
6 mm mass in the upper-outer right breast.
EXAM:
ULTRASOUND GUIDED RIGHT BREAST CORE NEEDLE BIOPSY

[Series 1: us biopsy breast core w/ imaging · 0.08mm/px · 9 of 9 slices shown]
[im 1/9]
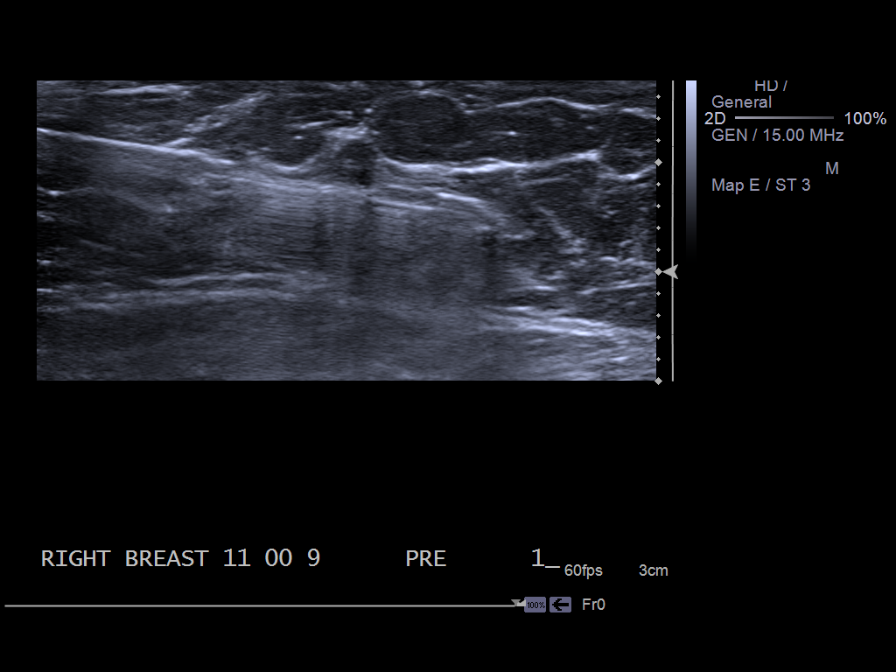
[im 2/9]
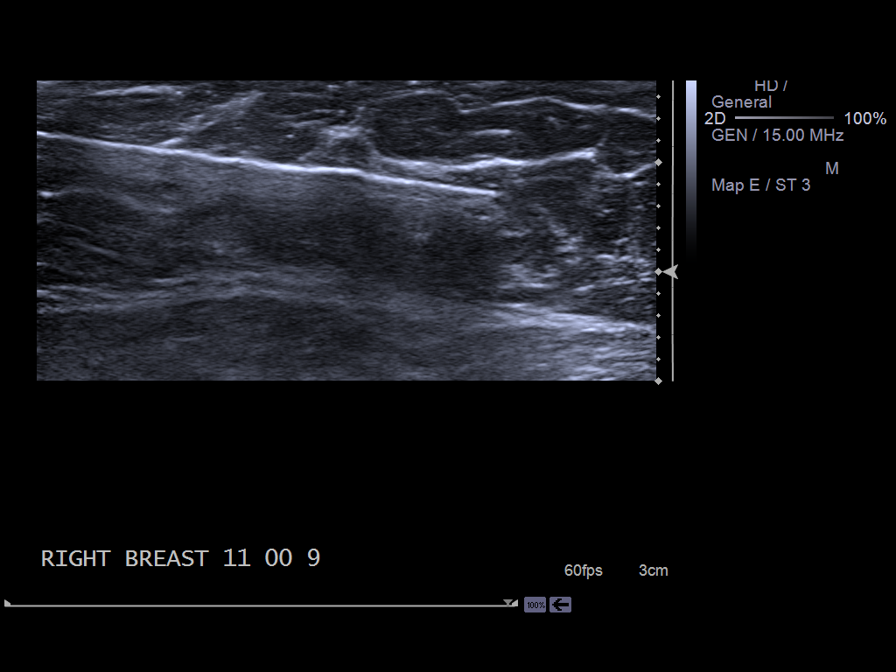
[im 3/9]
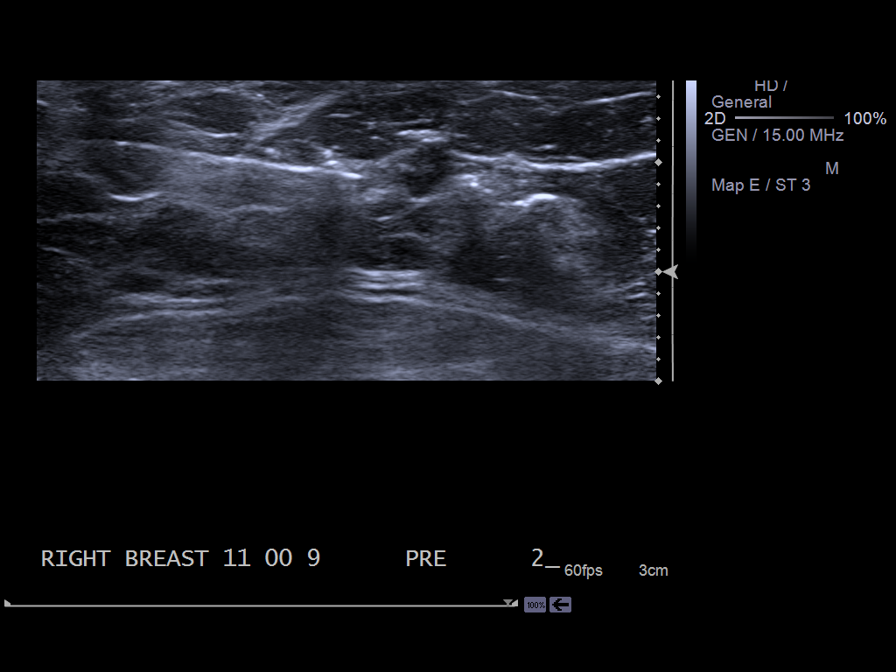
[im 4/9]
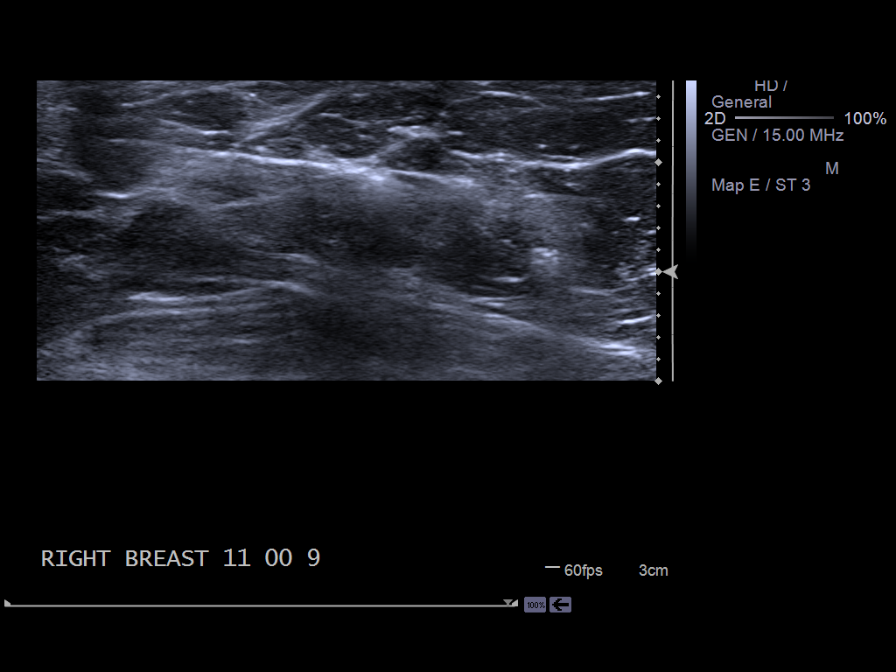
[im 5/9]
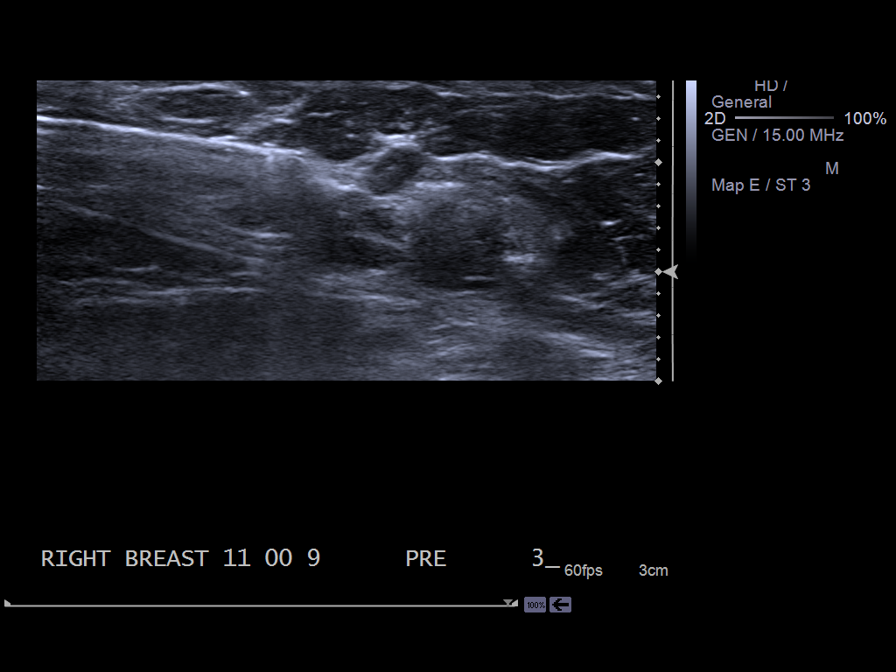
[im 6/9]
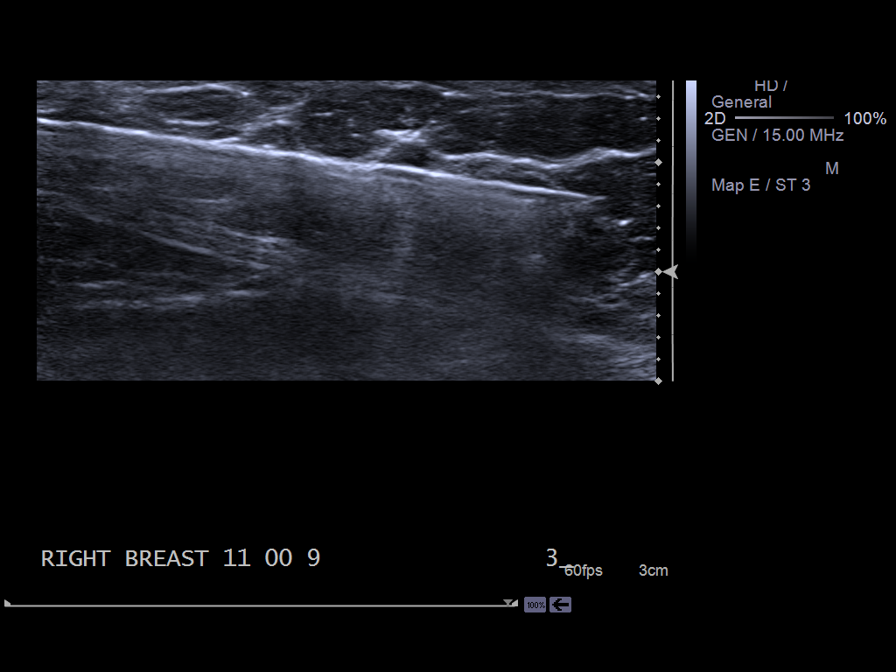
[im 7/9]
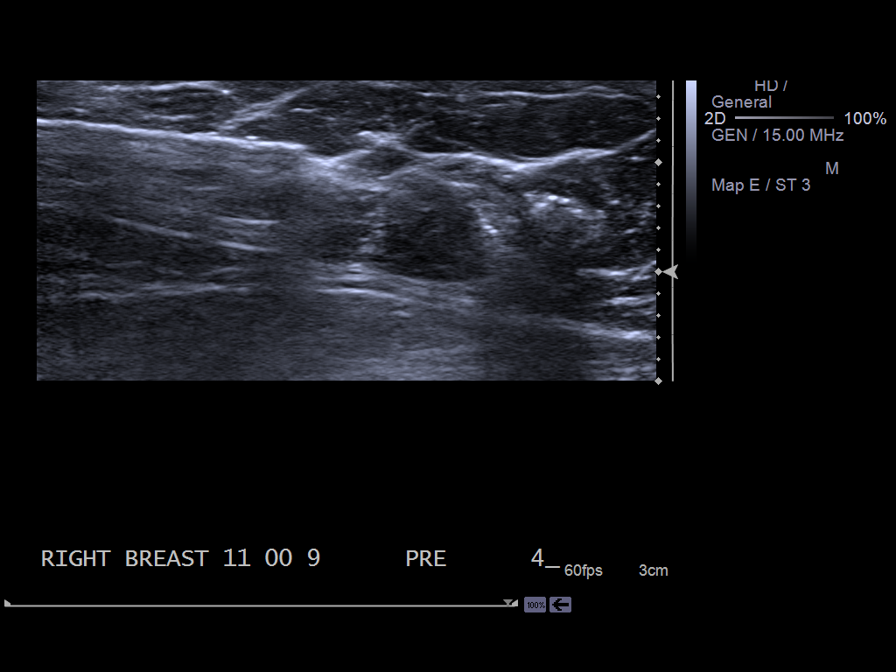
[im 8/9]
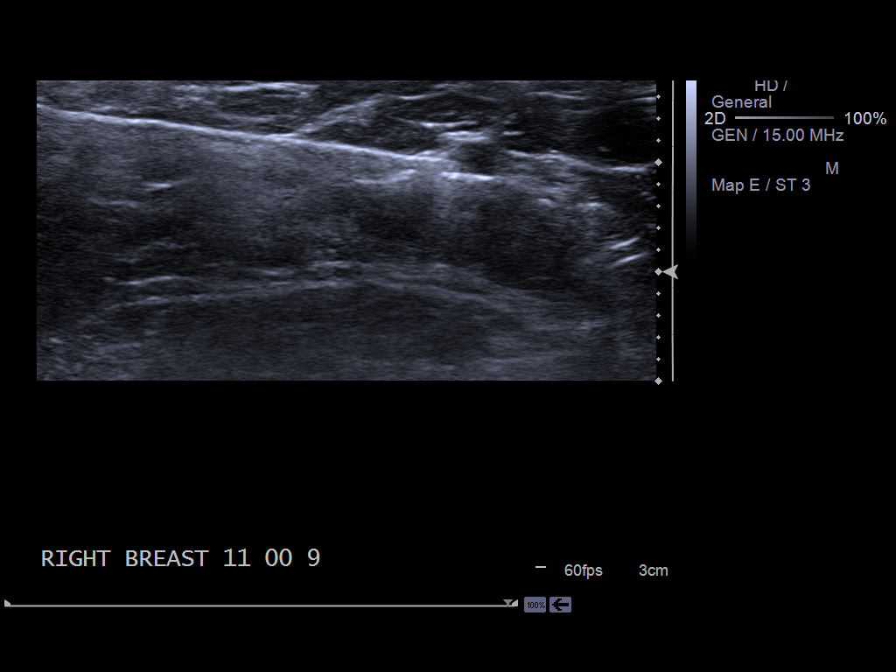
[im 9/9]
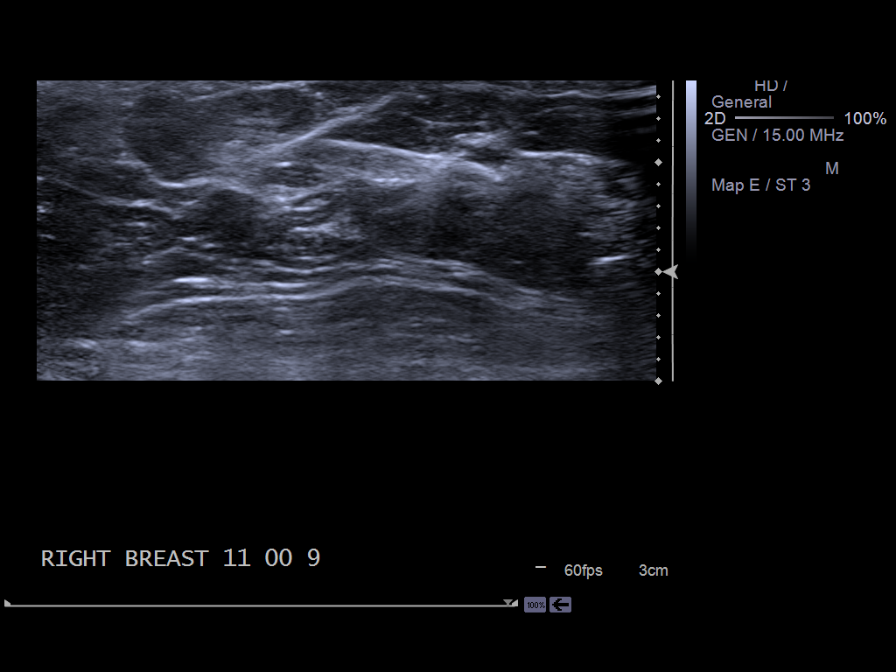

[9 of 9 positions shown; findings below may reference images not displayed]



Using sterile technique and 2% Lidocaine as local anesthetic, under
direct ultrasound visualization, a 14 gauge Roustaei Jalyani device was
used to perform biopsy of the 6 mm hypoechoic mass at the 11 o'clock
position of the right breast 9 cm from the nipple using a lateral
approach. At the conclusion of the procedure a coil shaped tissue
marker clip was deployed into the biopsy cavity. Follow up 2 view
mammogram was performed and dictated separately.
IMPRESSION: Ultrasound guided biopsy of upper-outer right breast mass. No
apparent complications.

Pathology will be followed.

## 2017-01-23 ENCOUNTER — Other Ambulatory Visit: Payer: Self-pay | Admitting: Internal Medicine

## 2017-01-23 DIAGNOSIS — I10 Essential (primary) hypertension: Secondary | ICD-10-CM

## 2017-02-02 ENCOUNTER — Telehealth: Payer: Self-pay | Admitting: Unknown Physician Specialty

## 2017-02-02 DIAGNOSIS — I10 Essential (primary) hypertension: Secondary | ICD-10-CM

## 2017-02-02 MED ORDER — LISINOPRIL 10 MG PO TABS: 10.0000 mg | ORAL_TABLET | Freq: Every day | ORAL | 0 refills | Status: DC

## 2017-02-02 NOTE — Telephone Encounter (Signed)
Routing to provider for refill. Patient last seen for physical 08/29/16 and needs follow up in December. Please route back to me after refill.

## 2017-02-02 NOTE — Telephone Encounter (Signed)
Refill request for Lisinopril. / LOV 08-29-16 with Kathrine Haddock / Preferred pharmacy [facility] [address] ([address]), [place] - [address] [place] ([place]) [place] [address] Phone: [phone] Fax: [phone]

## 2017-02-02 NOTE — Telephone Encounter (Signed)
Copied from [place]. Topic: Quick Communication - See Telephone Encounter >> Feb 02, 2017 10:25 AM Cleaster Corin, NT wrote: CRM for notification. See Telephone encounter for:   02/02/17. Pt. Called and she needs a refill on lisinopril pt. Will use [facility]. Please call pt. If she needs to schedule an appt. Before getting refill. Pt. Can be cantacted at ([facility] ([address]), [place]) [place] [address] Phone: [phone] Fax: [phone] Not a 24 hour pharmacy; exact hours not known

## 2017-02-02 NOTE — Telephone Encounter (Signed)
Patient is not over due for an appointment but is due for a 6 month f/up in December. Please call and see if patient would like to go ahead and schedule that follow up please.

## 2017-02-10 ENCOUNTER — Telehealth: Payer: Self-pay | Admitting: Unknown Physician Specialty

## 2017-02-10 NOTE — Telephone Encounter (Unsigned)
Copied from [place]. Topic: Quick Communication - See Telephone Encounter >> Feb 10, 2017 11:46 AM Boyd Kerbs wrote: CRM for notification. See Telephone encounter for:  [name] [phone] asking for verbal approval on Lisinopril. Ref # [id number], or can fax prescription to  Fax # [phone]  02/10/17.

## 2017-02-10 NOTE — Telephone Encounter (Signed)
[facility] asking for verbal approval on Lisinopril or prescription can be faxed.

## 2017-02-11 NOTE — Telephone Encounter (Signed)
Scheduled patients appt for 12/19 with Malachy Mood

## 2017-02-13 ENCOUNTER — Ambulatory Visit: Payer: 59 | Admitting: Internal Medicine

## 2017-02-13 ENCOUNTER — Other Ambulatory Visit: Payer: 59

## 2017-02-20 ENCOUNTER — Inpatient Hospital Stay: Payer: 59 | Admitting: Nurse Practitioner

## 2017-02-20 ENCOUNTER — Inpatient Hospital Stay: Payer: 59 | Attending: Internal Medicine

## 2017-02-20 DIAGNOSIS — G629 Polyneuropathy, unspecified: Secondary | ICD-10-CM | POA: Insufficient documentation

## 2017-02-20 DIAGNOSIS — C50811 Malignant neoplasm of overlapping sites of right female breast: Secondary | ICD-10-CM | POA: Insufficient documentation

## 2017-02-20 DIAGNOSIS — Z853 Personal history of malignant neoplasm of breast: Secondary | ICD-10-CM | POA: Insufficient documentation

## 2017-02-20 DIAGNOSIS — I10 Essential (primary) hypertension: Secondary | ICD-10-CM | POA: Insufficient documentation

## 2017-02-20 DIAGNOSIS — Z9221 Personal history of antineoplastic chemotherapy: Secondary | ICD-10-CM | POA: Diagnosis not present

## 2017-02-20 DIAGNOSIS — R42 Dizziness and giddiness: Secondary | ICD-10-CM | POA: Diagnosis not present

## 2017-02-20 DIAGNOSIS — Z923 Personal history of irradiation: Secondary | ICD-10-CM | POA: Diagnosis not present

## 2017-02-20 DIAGNOSIS — D472 Monoclonal gammopathy: Secondary | ICD-10-CM | POA: Insufficient documentation

## 2017-02-20 DIAGNOSIS — K219 Gastro-esophageal reflux disease without esophagitis: Secondary | ICD-10-CM | POA: Diagnosis not present

## 2017-02-20 DIAGNOSIS — J45909 Unspecified asthma, uncomplicated: Secondary | ICD-10-CM | POA: Diagnosis not present

## 2017-02-20 DIAGNOSIS — Z171 Estrogen receptor negative status [ER-]: Secondary | ICD-10-CM | POA: Insufficient documentation

## 2017-02-20 DIAGNOSIS — E669 Obesity, unspecified: Secondary | ICD-10-CM | POA: Diagnosis not present

## 2017-02-20 DIAGNOSIS — Z7982 Long term (current) use of aspirin: Secondary | ICD-10-CM | POA: Diagnosis not present

## 2017-02-20 DIAGNOSIS — D649 Anemia, unspecified: Secondary | ICD-10-CM | POA: Insufficient documentation

## 2017-02-20 LAB — COMPREHENSIVE METABOLIC PANEL
ALBUMIN: 3.7 g/dL (ref 3.5–5.0)
ALT: 16 U/L (ref 14–54)
AST: 20 U/L (ref 15–41)
Alkaline Phosphatase: 71 U/L (ref 38–126)
Anion gap: 6 (ref 5–15)
BUN: 19 mg/dL (ref 6–20)
CHLORIDE: 106 mmol/L (ref 101–111)
CO2: 25 mmol/L (ref 22–32)
CREATININE: 0.86 mg/dL (ref 0.44–1.00)
Calcium: 9 mg/dL (ref 8.9–10.3)
GFR calc Af Amer: >60 mL/min (ref >60)
GLUCOSE: 105 mg/dL — AB (ref 65–99)
POTASSIUM: 3.3 mmol/L — AB (ref 3.5–5.1)
Sodium: 137 mmol/L (ref 135–145)
Total Bilirubin: 0.7 mg/dL (ref 0.3–1.2)
Total Protein: 7.4 g/dL (ref 6.5–8.1)

## 2017-02-20 LAB — CBC WITH DIFFERENTIAL/PLATELET
BASOS ABS: 0 10*3/uL (ref 0–0.1)
BASOS PCT: 1 %
EOS PCT: 2 %
Eosinophils Absolute: 0.2 10*3/uL (ref 0–0.7)
HEMATOCRIT: 30.7 % — AB (ref 35.0–47.0)
Hemoglobin: 10.1 g/dL — ABNORMAL LOW (ref 12.0–16.0)
LYMPHS PCT: 24 %
Lymphs Abs: 1.7 10*3/uL (ref 1.0–3.6)
MCH: 32.1 pg (ref 26.0–34.0)
MCHC: 32.9 g/dL (ref 32.0–36.0)
MCV: 97.7 fL (ref 80.0–100.0)
Monocytes Absolute: 0.7 10*3/uL (ref 0.2–0.9)
Monocytes Relative: 10 %
Neutro Abs: 4.5 10*3/uL (ref 1.4–6.5)
Neutrophils Relative %: 63 %
PLATELETS: 221 10*3/uL (ref 150–440)
RBC: 3.14 MIL/uL — AB (ref 3.80–5.20)
RDW: 14.7 % — ABNORMAL HIGH (ref 11.5–14.5)
WBC: 7.1 10*3/uL (ref 3.6–11.0)

## 2017-02-21 LAB — CANCER ANTIGEN 27.29: CAN 27.29: 15.1 U/mL (ref 0.0–38.6)

## 2017-02-23 ENCOUNTER — Encounter: Payer: Self-pay | Admitting: Internal Medicine

## 2017-02-23 LAB — KAPPA/LAMBDA LIGHT CHAINS
KAPPA FREE LGHT CHN: 20.8 mg/L — AB (ref 3.3–19.4)
Kappa, lambda light chain ratio: 1.89 — ABNORMAL HIGH (ref 0.26–1.65)
Lambda free light chains: 11 mg/L (ref 5.7–26.3)

## 2017-02-24 ENCOUNTER — Encounter: Payer: Self-pay | Admitting: Internal Medicine

## 2017-02-24 LAB — MULTIPLE MYELOMA PANEL, SERUM
ALBUMIN SERPL ELPH-MCNC: 3.6 g/dL (ref 2.9–4.4)
Albumin/Glob SerPl: 1.1 (ref 0.7–1.7)
Alpha 1: 0.2 g/dL (ref 0.0–0.4)
Alpha2 Glob SerPl Elph-Mcnc: 0.7 g/dL (ref 0.4–1.0)
B-Globulin SerPl Elph-Mcnc: 0.9 g/dL (ref 0.7–1.3)
GLOBULIN, TOTAL: 3.4 g/dL (ref 2.2–3.9)
Gamma Glob SerPl Elph-Mcnc: 1.6 g/dL (ref 0.4–1.8)
IGA: 135 mg/dL (ref 87–352)
IGG (IMMUNOGLOBIN G), SERUM: 1614 mg/dL — AB (ref 700–1600)
IGM (IMMUNOGLOBULIN M), SRM: 39 mg/dL (ref 26–217)
M PROTEIN SERPL ELPH-MCNC: 1 g/dL — AB
TOTAL PROTEIN ELP: 7 g/dL (ref 6.0–8.5)

## 2017-02-25 ENCOUNTER — Ambulatory Visit: Payer: 59 | Admitting: Unknown Physician Specialty

## 2017-02-25 ENCOUNTER — Telehealth: Payer: Self-pay | Admitting: Unknown Physician Specialty

## 2017-02-25 ENCOUNTER — Encounter: Payer: Self-pay | Admitting: Unknown Physician Specialty

## 2017-02-25 VITALS — BP 130/83 | HR 76 | Temp 98.2°F | Wt 210.6 lb

## 2017-02-25 DIAGNOSIS — Z23 Encounter for immunization: Secondary | ICD-10-CM | POA: Diagnosis not present

## 2017-02-25 DIAGNOSIS — K5909 Other constipation: Secondary | ICD-10-CM | POA: Diagnosis not present

## 2017-02-25 DIAGNOSIS — J4520 Mild intermittent asthma, uncomplicated: Secondary | ICD-10-CM

## 2017-02-25 DIAGNOSIS — E559 Vitamin D deficiency, unspecified: Secondary | ICD-10-CM

## 2017-02-25 DIAGNOSIS — I10 Essential (primary) hypertension: Secondary | ICD-10-CM | POA: Diagnosis not present

## 2017-02-25 MED ORDER — LUBIPROSTONE 8 MCG PO CAPS: 8.0000 ug | ORAL_CAPSULE | Freq: Two times a day (BID) | ORAL | 1 refills | Status: DC

## 2017-02-25 MED ORDER — LINACLOTIDE 145 MCG PO CAPS: 145.0000 ug | ORAL_CAPSULE | Freq: Every day | ORAL | 1 refills | Status: DC

## 2017-02-25 NOTE — Telephone Encounter (Signed)
I called in something else.  Please let her know that I don't know what medication her insurance covers until I call it in.  There are few prescription generic alternatives.

## 2017-02-25 NOTE — Telephone Encounter (Signed)
Copied from [place] [phone]. Topic: Quick Communication - See Telephone Encounter >> Feb 25, 2017  4:05 PM Aurelio Brash B wrote: CRM for notification. See Telephone encounter for:  Pt was prescribed linaclotide  this morning and pt states this is not affordable in 90 day supply, pt wants to know if Kathrine Haddock  can prescribe something  more affordable or in generic form  [place]  02/25/17.

## 2017-02-25 NOTE — Assessment & Plan Note (Signed)
Stable, continue present medications.   

## 2017-02-25 NOTE — Telephone Encounter (Signed)
Called and left patient a VM letting her know what Cheryl said. Asked for her to call back with any questions or concerns.  

## 2017-02-25 NOTE — Assessment & Plan Note (Signed)
Worsening.  Failed Miralax and Ducolox.  Start Linzess 145 mg daily

## 2017-02-25 NOTE — Telephone Encounter (Signed)
Routing to provider  

## 2017-02-25 NOTE — Progress Notes (Signed)
BP 130/83 (BP Location: Left Arm, Patient Position: Sitting, Cuff Size: Normal)   Pulse 76   Temp 98.2 F (36.8 C) (Oral)   Wt 210 lb 9.6 oz (95.5 kg)   LMP  (LMP Unknown)   SpO2 99%   BMI 34.94 kg/m    Subjective:    Patient ID: [name], female    DOB: Jul 03, 1960, 56 y.o.   MRN: [id number]  HPI: ARIELLA VOIT is a 56 y.o. female  Chief Complaint  Patient presents with  . Follow-up    6 month   Hypertension Using medications without difficulty Average home BPs   No problems or lightheadedness No chest pain with exertion or shortness of breath No Edema  Asthma Doing well.  Cayenne pepper is a trigger. Taking Asthma inhaler only occasionally  Constipation  This is a chronic problem. The problem has been gradually worsening since onset. Her stool frequency is 1 time per week or less. The stool is described as pellet like. The patient is on a high fiber diet. She does not exercise regularly. There has been adequate water intake. Pertinent negatives include no abdominal pain, back pain, fecal incontinence or weight loss. Treatments tried: Has been using Miralax daily and Ducolox on occasion.   Relevant past medical, surgical, family and social history reviewed and updated as indicated. Interim medical history since our last visit reviewed. Allergies and medications reviewed and updated.  Review of Systems  Constitutional: Negative for weight loss.  Gastrointestinal: Positive for constipation. Negative for abdominal pain.  Musculoskeletal: Negative for back pain.    Per HPI unless specifically indicated above     Objective:    BP 130/83 (BP Location: Left Arm, Patient Position: Sitting, Cuff Size: Normal)   Pulse 76   Temp 98.2 F (36.8 C) (Oral)   Wt 210 lb 9.6 oz (95.5 kg)   LMP  (LMP Unknown)   SpO2 99%   BMI 34.94 kg/m   Wt Readings from Last 3 Encounters:  02/25/17 210 lb 9.6 oz (95.5 kg)  11/03/16 208 lb 10.7 oz (94.7 kg)  09/05/16 201 lb 8 oz  (91.4 kg)    Physical Exam  Constitutional: She is oriented to person, place, and time. She appears well-developed and well-nourished. No distress.  HENT:  Head: Normocephalic and atraumatic.  Eyes: Conjunctivae and lids are normal. Right eye exhibits no discharge. Left eye exhibits no discharge. No scleral icterus.  Neck: Normal range of motion. Neck supple. No JVD present. Carotid bruit is not present.  Cardiovascular: Normal rate, regular rhythm and normal heart sounds.  Pulmonary/Chest: Effort normal and breath sounds normal.  Abdominal: Normal appearance. There is no splenomegaly or hepatomegaly.  Musculoskeletal: Normal range of motion.  Neurological: She is alert and oriented to person, place, and time.  Skin: Skin is warm, dry and intact. No rash noted. No pallor.  Psychiatric: She has a normal mood and affect. Her behavior is normal. Judgment and thought content normal.    Results for orders placed or performed in visit on 02/20/17  Kappa/lambda light chains  Result Value Ref Range   Kappa free light chain 20.8 (H) 3.3 - 19.4 mg/L   Lamda free light chains 11.0 5.7 - 26.3 mg/L   Kappa, lamda light chain ratio 1.89 (H) 0.26 - 1.65  Multiple Myeloma Panel (SPEP&IFE w/QIG)  Result Value Ref Range   IgG (Immunoglobin G), Serum 1,614 (H) 700 - 1,600 mg/dL   IgA 135 87 - 352 mg/dL  IgM (Immunoglobulin M), Srm 39 26 - 217 mg/dL   Total Protein ELP 7.0 6.0 - 8.5 g/dL   Albumin SerPl Elph-Mcnc 3.6 2.9 - 4.4 g/dL   Alpha 1 0.2 0.0 - 0.4 g/dL   Alpha2 Glob SerPl Elph-Mcnc 0.7 0.4 - 1.0 g/dL   B-Globulin SerPl Elph-Mcnc 0.9 0.7 - 1.3 g/dL   Gamma Glob SerPl Elph-Mcnc 1.6 0.4 - 1.8 g/dL   M Protein SerPl Elph-Mcnc 1.0 (H) Not Observed g/dL   Globulin, Total 3.4 2.2 - 3.9 g/dL   Albumin/Glob SerPl 1.1 0.7 - 1.7   IFE 1 Comment    Please Note Comment   Cancer antigen 27.29  Result Value Ref Range   CA 27.29 15.1 0.0 - 38.6 U/mL  CBC with Differential  Result Value Ref Range    WBC 7.1 3.6 - 11.0 K/uL   RBC 3.14 (L) 3.80 - 5.20 MIL/uL   Hemoglobin 10.1 (L) 12.0 - 16.0 g/dL   HCT 30.7 (L) 35.0 - 47.0 %   MCV 97.7 80.0 - 100.0 fL   MCH 32.1 26.0 - 34.0 pg   MCHC 32.9 32.0 - 36.0 g/dL   RDW 14.7 (H) 11.5 - 14.5 %   Platelets 221 150 - 440 K/uL   Neutrophils Relative % 63 %   Neutro Abs 4.5 1.4 - 6.5 K/uL   Lymphocytes Relative 24 %   Lymphs Abs 1.7 1.0 - 3.6 K/uL   Monocytes Relative 10 %   Monocytes Absolute 0.7 0.2 - 0.9 K/uL   Eosinophils Relative 2 %   Eosinophils Absolute 0.2 0 - 0.7 K/uL   Basophils Relative 1 %   Basophils Absolute 0.0 0 - 0.1 K/uL  Comprehensive metabolic panel  Result Value Ref Range   Sodium 137 135 - 145 mmol/L   Potassium 3.3 (L) 3.5 - 5.1 mmol/L   Chloride 106 101 - 111 mmol/L   CO2 25 22 - 32 mmol/L   Glucose, Bld 105 (H) 65 - 99 mg/dL   BUN 19 6 - 20 mg/dL   Creatinine, Ser 0.86 0.44 - 1.00 mg/dL   Calcium 9.0 8.9 - 10.3 mg/dL   Total Protein 7.4 6.5 - 8.1 g/dL   Albumin 3.7 3.5 - 5.0 g/dL   AST 20 15 - 41 U/L   ALT 16 14 - 54 U/L   Alkaline Phosphatase 71 38 - 126 U/L   Total Bilirubin 0.7 0.3 - 1.2 mg/dL   GFR calc non Af Amer >60 >60 mL/min   GFR calc Af Amer >60 >60 mL/min   Anion gap 6 5 - 15      Assessment & Plan:   Problem List Items Addressed This Visit      Unprioritized   Asthma    Stable, continue present medications.        Chronic constipation    Worsening.  Failed Miralax and Ducolox.  Start Linzess 145 mg daily      Hypertension    Stable, continue present medications.        Relevant Orders   Comprehensive metabolic panel   Lipid Panel w/o Chol/HDL Ratio    Other Visit Diagnoses    Need for influenza vaccination    -  Primary   Relevant Orders   Flu Vaccine QUAD 6+ mos PF IM (Fluarix Quad PF) (Completed)   Vitamin D deficiency       Relevant Orders   VITAMIN D 25 Hydroxy (Vit-D Deficiency, Fractures)       Follow  up plan: Return for physical in June.

## 2017-02-26 ENCOUNTER — Encounter: Payer: Self-pay | Admitting: Nurse Practitioner

## 2017-02-26 ENCOUNTER — Other Ambulatory Visit: Payer: Self-pay

## 2017-02-26 ENCOUNTER — Inpatient Hospital Stay (HOSPITAL_BASED_OUTPATIENT_CLINIC_OR_DEPARTMENT_OTHER): Payer: 59 | Admitting: Nurse Practitioner

## 2017-02-26 VITALS — BP 165/94 | HR 85 | Temp 97.5°F | Resp 20 | Ht 65.1 in | Wt 208.0 lb

## 2017-02-26 DIAGNOSIS — Z171 Estrogen receptor negative status [ER-]: Secondary | ICD-10-CM | POA: Diagnosis not present

## 2017-02-26 DIAGNOSIS — G629 Polyneuropathy, unspecified: Secondary | ICD-10-CM

## 2017-02-26 DIAGNOSIS — Z853 Personal history of malignant neoplasm of breast: Secondary | ICD-10-CM

## 2017-02-26 DIAGNOSIS — C50811 Malignant neoplasm of overlapping sites of right female breast: Secondary | ICD-10-CM

## 2017-02-26 DIAGNOSIS — Z79899 Other long term (current) drug therapy: Secondary | ICD-10-CM

## 2017-02-26 DIAGNOSIS — D649 Anemia, unspecified: Secondary | ICD-10-CM

## 2017-02-26 DIAGNOSIS — Z923 Personal history of irradiation: Secondary | ICD-10-CM | POA: Diagnosis not present

## 2017-02-26 DIAGNOSIS — R42 Dizziness and giddiness: Secondary | ICD-10-CM | POA: Diagnosis not present

## 2017-02-26 DIAGNOSIS — Z9221 Personal history of antineoplastic chemotherapy: Secondary | ICD-10-CM

## 2017-02-26 DIAGNOSIS — G620 Drug-induced polyneuropathy: Secondary | ICD-10-CM

## 2017-02-26 DIAGNOSIS — D472 Monoclonal gammopathy: Secondary | ICD-10-CM

## 2017-02-26 DIAGNOSIS — T451X5A Adverse effect of antineoplastic and immunosuppressive drugs, initial encounter: Secondary | ICD-10-CM

## 2017-02-26 LAB — COMPREHENSIVE METABOLIC PANEL
A/G RATIO: 1.2 (ref 1.2–2.2)
ALT: 15 IU/L (ref 0–32)
AST: 16 IU/L (ref 0–40)
Albumin: 4 g/dL (ref 3.5–5.5)
Alkaline Phosphatase: 82 IU/L (ref 39–117)
BUN/Creatinine Ratio: 16 (ref 9–23)
BUN: 13 mg/dL (ref 6–24)
Bilirubin Total: 0.4 mg/dL (ref 0.0–1.2)
CALCIUM: 9.4 mg/dL (ref 8.7–10.2)
CO2: 21 mmol/L (ref 20–29)
CREATININE: 0.83 mg/dL (ref 0.57–1.00)
Chloride: 103 mmol/L (ref 96–106)
GFR, EST AFRICAN AMERICAN: 90 mL/min/{1.73_m2} (ref >59)
GFR, EST NON AFRICAN AMERICAN: 78 mL/min/{1.73_m2} (ref >59)
GLOBULIN, TOTAL: 3.3 g/dL (ref 1.5–4.5)
Glucose: 91 mg/dL (ref 65–99)
POTASSIUM: 4 mmol/L (ref 3.5–5.2)
SODIUM: 139 mmol/L (ref 134–144)
Total Protein: 7.3 g/dL (ref 6.0–8.5)

## 2017-02-26 LAB — LIPID PANEL W/O CHOL/HDL RATIO
Cholesterol, Total: 198 mg/dL (ref 100–199)
HDL: 64 mg/dL (ref >39)
LDL CALC: 122 mg/dL — AB (ref 0–99)
Triglycerides: 58 mg/dL (ref 0–149)
VLDL Cholesterol Cal: 12 mg/dL (ref 5–40)

## 2017-02-26 LAB — VITAMIN D 25 HYDROXY (VIT D DEFICIENCY, FRACTURES): VIT D 25 HYDROXY: 32.1 ng/mL (ref 30.0–100.0)

## 2017-02-26 NOTE — Progress Notes (Signed)
[facility] OFFICE PROGRESS NOTE  Patient Care Team: Kathrine Haddock, NP as PCP - General (Nurse Practitioner) Bary Castilla, Forest Gleason, MD (General Surgery) Cammie Sickle, MD as Consulting Physician (Internal Medicine)   SUMMARY OF ONCOLOGIC HISTORY:  Oncology History   # MAY 2016- RIGHT BREAST CANCER- Triple negative [T=0.5cm;G-3; margins-Neg]; Mammaprint- 0.814/molecular basal type [5 year risk- 22%; 10 year risk-29%] AC q3 W- taxol q w x 8/ 12 [sec to PN; finished Feb 2017].   # 2012 LEFT BREAST CA STAGE I s/p Lumpec [T1 (0.7CM);Triple Neg; G-3]; s/p mammosite RT; No adj chemo  #  Mild Anemia/M-protein 0.8gm/dl [sep 2016]  # BRCA- 1&2 NEGATIVE [july 2016]      Carcinoma of overlapping sites of right breast in female, estrogen receptor negative ([place])    INTERVAL HISTORY:  A pleasant 56 year old female patient with above history of stage I right-sided triple negative breast cancer who returns to clinic today for routine follow-up.    Most recent mammogram was 06/30/16.  Patient performs self breast exams and denies changes denies concerns.  States she feels well.  Denies headaches or vision changes.  Continues to have chronic, intermittent dizziness controlled with meclizine.  Previously declined ENT evaluation.  Denies new lumps or bumps.  Denies fatigue or weakness.  Denies bone pain.  Denies any bleeding or bruising.   REVIEW OF SYSTEMS:  A complete 10 point review of system is done which is negative except mentioned above/history of present illness.   PAST MEDICAL HISTORY :  Past Medical History:  Diagnosis Date  . Anemia   . Asthma   . BRCA negative 09/22/2014  . Breast cancer ([place]) 2012   LT LUMPECTOMY  . Breast cancer ([place]) 2016   RT LUMPECTOMY  . Breast cyst 2010   cyst- drained  . Breast screening, unspecified   . Constipation   . Dry eyes 2012  . GERD (gastroesophageal reflux disease)   . Hematuria   . Kidney stone   . Lump or mass in  breast   . Malignant neoplasm of breast (female), unspecified site 07/08/2014   Right breast, 5 mm, T1a,N0, triple negative; high risk for recurrence on Mammoprint.  . Malignant neoplasm of upper-outer quadrant of female breast ([place]) 05/08/2010   Left breast: T1b, N0, M); triple negative, DCIS present. No chemotherapy. MammoSite radiation.  . Neuropathy    pt states in fingertips and feet  . Obesity, unspecified   . Pancreatic lesion   . Personal history of chemotherapy 2016   BREAST CA  . 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 Personal history of radiation therapy 2012   BREAST CA - MAMMOSITE  . Personal history of radiation therapy 2016   BREAST CA - MAMMOSITE  . Screening for obesity   . Seasonal allergies   . Special screening for malignant neoplasms, colon   . Unspecified essential hypertension     PAST SURGICAL HISTORY :   Past Surgical History:  Procedure Laterality Date  . BREAST BIOPSY    . BREAST CYST ASPIRATION Left 2010  . BREAST LUMPECTOMY Left 2012   BREAST CA  . BREAST LUMPECTOMY Right 2016   BREAST CA  . BREAST LUMPECTOMY WITH AXILLARY LYMPH NODE BIOPSY Right 08/01/2014   Procedure: BREAST LUMPECTOMY WITH AXILLARY LYMPH NODE BIOPSY;  Surgeon: Robert Bellow, MD;  Location: [facility] ORS;  Service: General;  Laterality: Right;  . BREAST MAMMOSITE  April 2012   mammosite placement and removal   . BREAST SURGERY Left 2012   left breast wide excision, sentinel node, MammoSite  . BREAST SURGERY Right 08/06/2014   Wide excision/sentinel node biopsy, T1a, N0 triple negative, MammoSite  . CESAREAN SECTION  1988  . COLONOSCOPY  October 17, 2009   Dr. Dionne Milo, normal study.  Marland Kitchen KIDNEY STONE SURGERY  2003   stones removed by Dr. Quillian Quince- stent placed/removed  . PORTACATH PLACEMENT  Left 11/09/2014   Procedure: INSERTION PORT-A-CATH;  Surgeon: Robert Bellow, MD;  Location: [facility] ORS;  Service: General;  Laterality: Left;  . SENTINEL NODE BIOPSY Right 08/01/2014   Procedure: SENTINEL NODE BIOPSY;  Surgeon: Robert Bellow, MD;  Location: [facility] ORS;  Service: General;  Laterality: Right;  . TUBAL LIGATION      FAMILY HISTORY :   Family History  Problem Relation Age of Onset  . Ovarian cancer Mother   . Diabetes Mother   . Cancer Father        prostate  . Diabetes Father   . Hypertension Brother   . Cancer Maternal Grandmother        bone  . Cancer Maternal Grandfather        lung  . Cancer Paternal Grandmother        lung  . Asthma Paternal Grandfather   . Hypertension Sister   . Breast cancer Maternal Aunt 53    SOCIAL HISTORY:   Social History   Tobacco Use  . Smoking status: Never Smoker  . Smokeless tobacco: Never Used  Substance Use Topics  . Alcohol use: Yes    Comment: occasionally  . Drug use: No    ALLERGIES:  is allergic to cayenne pepper [cayenne]; shellfish allergy; and tape.  MEDICATIONS:  Current Outpatient Medications  Medication Sig Dispense Refill  . albuterol (PROVENTIL HFA;VENTOLIN HFA) 108 (90 Base) MCG/ACT inhaler Inhale 2 puffs into the lungs every 6 (six) hours as needed for wheezing or shortness of breath. 1 Inhaler 3  . aspirin 81 MG tablet Take 81 mg by mouth daily.    . Cholecalciferol (VITAMIN D3) 1000 UNITS CAPS Take 1 capsule by mouth daily.     Marland Kitchen esomeprazole (NEXIUM) 40 MG capsule Take 40 mg by mouth 2 (two) times daily as needed (acid reflux).     Marland Kitchen linaclotide (LINZESS) 145 MCG CAPS capsule Take 1 capsule (145 mcg total) by mouth daily before breakfast. 90 capsule 1  . lisinopril (PRINIVIL,ZESTRIL) 10 MG tablet Take 1 tablet (10 mg total) by mouth daily. 90 tablet 0  . loratadine (CLARITIN) 10 MG tablet Take 10 mg by mouth daily. Reported on 04/13/2015    . meclizine (ANTIVERT) 12.5 MG tablet Take 1 tablet (12.5  mg total) by mouth 3 (three) times daily as needed for dizziness. 40 tablet 0  . polyethylene glycol (MIRALAX / GLYCOLAX) packet Take 17 g by mouth every other day.    . sennosides-docusate sodium (SENOKOT-S) 8.6-50 MG tablet Take 1 tablet by mouth daily.    . bisacodyl (DULCOLAX) 5 MG EC tablet Take 5 mg by mouth daily as needed for moderate constipation.    Marland Kitchen lubiprostone (AMITIZA) 8 MCG capsule Take 1 capsule (8 mcg total) by mouth 2 (two) times daily with a meal. (Patient not taking: Reported on 02/26/2017) 180 capsule 1   No current facility-administered medications for  this visit.    Facility-Administered Medications Ordered in Other Visits  Medication Dose Route Frequency Provider Last Rate Last Dose  . sodium chloride 0.9 % injection 10 mL  10 mL Intravenous PRN Leia Alf, MD   10 mL at 11/10/14 1138    PHYSICAL EXAMINATION: ECOG PERFORMANCE STATUS: 1 - Symptomatic but completely ambulatory  BP (!) 165/94 (Patient Position: Sitting)   Pulse 85   Temp (!) 97.5 F (36.4 C) (Tympanic)   Resp 20   Ht 5' 5.1" (1.654 m)   Wt 208 lb (94.3 kg)   LMP  (LMP Unknown)   BMI 34.51 kg/m   Filed Weights   02/26/17 0951  Weight: 208 lb (94.3 kg)    GENERAL: Well-nourished well-developed; Alert, no distress and comfortable.  She is alone.  EYES: no pallor or icterus OROPHARYNX: no thrush or ulceration; good dentition  NECK: supple, no masses felt LYMPH:  no palpable lymphadenopathy in the cervical, axillary or inguinal regions LUNGS: clear to auscultation and  No wheeze or crackles HEART/CVS: regular rate & rhythm and no murmurs; No lower extremity edema ABDOMEN:abdomen soft, non-tender and normal bowel sounds Musculoskeletal:no cyanosis of digits and no clubbing  PSYCH: alert & oriented x 3 with fluent speech NEURO: no focal motor/sensory deficits SKIN:  no rashes or significant lesions.   Breast exam was performed in seated and lying down position.   Right breast-status  post lumpectomy with well-healed surgical scar.  Thickening between wide excision and lymph node biopsy site.  Nontender.  No inverted nipple.  No mass.  No nipple discharge.  No skin changes. Left breast-status post lumpectomy with well-healed surgical scar.  Well-healed surgical scar consistent with port site.  Some thickening between wide excision and lymph node biopsy site.  Nodularity consistent with calcifications on mammogram.  Nontender.  No inverted nipple.  No mass.  No nipple discharge.  No skin changes.   LABORATORY DATA:  I have reviewed the data as listed    Component Value Date/Time   NA 139 02/25/2017 1139   NA 140 07/16/2011 2130   K 4.0 02/25/2017 1139   K 3.7 07/16/2011 2130   CL 103 02/25/2017 1139   CL 106 07/16/2011 2130   CO2 21 02/25/2017 1139   CO2 27 07/16/2011 2130   GLUCOSE 91 02/25/2017 1139   GLUCOSE 105 (H) 02/20/2017 1408   GLUCOSE 93 07/16/2011 2130   BUN 13 02/25/2017 1139   BUN 16 07/16/2011 2130   CREATININE 0.83 02/25/2017 1139   CREATININE 0.95 07/16/2011 2130   CALCIUM 9.4 02/25/2017 1139   CALCIUM 9.0 07/16/2011 2130   PROT 7.3 02/25/2017 1139   ALBUMIN 4.0 02/25/2017 1139   AST 16 02/25/2017 1139   ALT 15 02/25/2017 1139   ALKPHOS 82 02/25/2017 1139   BILITOT 0.4 02/25/2017 1139   GFRNONAA 78 02/25/2017 1139   GFRNONAA >60 07/16/2011 2130   GFRAA 90 02/25/2017 1139   GFRAA >60 07/16/2011 2130    No results found for: SPEP, UPEP  Lab Results  Component Value Date   WBC 7.1 02/20/2017   NEUTROABS 4.5 02/20/2017   HGB 10.1 (L) 02/20/2017   HCT 30.7 (L) 02/20/2017   MCV 97.7 02/20/2017   PLT 221 02/20/2017      Chemistry      Component Value Date/Time   NA 139 02/25/2017 1139   NA 140 07/16/2011 2130   K 4.0 02/25/2017 1139   K 3.7 07/16/2011 2130   CL 103 02/25/2017 1139  CL 106 07/16/2011 2130   CO2 21 02/25/2017 1139   CO2 27 07/16/2011 2130   BUN 13 02/25/2017 1139   BUN 16 07/16/2011 2130   CREATININE 0.83  02/25/2017 1139   CREATININE 0.95 07/16/2011 2130      Component Value Date/Time   CALCIUM 9.4 02/25/2017 1139   CALCIUM 9.0 07/16/2011 2130   ALKPHOS 82 02/25/2017 1139   AST 16 02/25/2017 1139   ALT 15 02/25/2017 1139   BILITOT 0.4 02/25/2017 1139     MM DIAG BREAST TOMO BILATERAL 06/30/16 Stable bilateral postlumpectomy changes with scattered areas of fibroglandular density.  No evidence of malignancy with recommendation for bilateral diagnostic mammogram in 1 year.  BI-RADS Category 2 Electronically Signed   By: Claudie Revering M.D.   On: 06/30/2016 10:59    ASSESSMENT & PLAN:   Carcinoma of overlapping sites of right breast in female, estrogen receptor negative ([place]) # RIGHT BREAST CANCER STAGE I - Triple negative s/p adjuvant AC x 4 followed by weekly Taxol x 8 [stopped d/t PN Jan 2017]. No concerns for recurrence. Last mammogram April 2018 within normal limits (scheduled by Dr. Bary Castilla). CA27.29- 15.1.   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 Mild improvement. Intol to gabapentin. Will refer for acupuncture as pt wishes to avoid medications at this time.   # Chronic intermittent dizziness/vertigo-Declined ENT evaluation. Continue meclizine as needed.   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 Will repeat M protein/kapp-lamda light chain in 6 months  # follow up in 6 months with labs week prior.    Beckey Rutter, DNP, AGNP-C [facility] [phone] (office) 02/26/17 4:45 PM

## 2017-02-26 NOTE — Progress Notes (Signed)
Reports constipation. Has not had any BM in almost 2 weeks. Has used ducolax, miralax and linzess daily without producing BM.  Patient states her insurance will not Amitiza capsules. Her pcp is managing her constipation medications.

## 2017-02-26 NOTE — Assessment & Plan Note (Addendum)
#   RIGHT BREAST CANCER STAGE I - Triple negative s/p adjuvant AC x 4 followed by weekly Taxol x 8 [stopped d/t PN Jan 2017]. No concerns for recurrence. Last mammogram April 2018 within normal limits (scheduled by Dr. Bary Castilla). CA27.29- 15.1.   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 Mild improvement. Intol to gabapentin. Will refer for acupuncture as pt wishes to avoid medications at this time.   # Chronic intermittent dizziness/vertigo-Declined ENT evaluation. Continue meclizine as needed.   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 Will repeat M protein/kapp-lamda light chain in 6 months  # follow up in 6 months with labs week prior.

## 2017-02-27 ENCOUNTER — Encounter: Payer: Self-pay | Admitting: Unknown Physician Specialty

## 2017-03-05 ENCOUNTER — Encounter: Payer: Self-pay | Admitting: *Deleted

## 2017-03-05 NOTE — Progress Notes (Signed)
  Oncology Nurse Navigator Documentation  Navigator Location: [facility] (03/05/17 1500)   )Navigator Encounter Type: Telephone (03/05/17 1500) Telephone: Lahoma Crocker Call (03/05/17 1500)                       Barriers/Navigation Needs: Coordination of Care (03/05/17 1500)                          Time Spent with Patient: 15 (03/05/17 1500)   Received referral from Beckey Rutter, NP for assistance for acupuncture for patients neuropathy.  Left patient a message to return my call.  We can refer through our [employer].

## 2017-03-16 ENCOUNTER — Encounter: Payer: Self-pay | Admitting: *Deleted

## 2017-03-16 ENCOUNTER — Other Ambulatory Visit: Payer: Self-pay | Admitting: Unknown Physician Specialty

## 2017-03-16 ENCOUNTER — Other Ambulatory Visit: Payer: Self-pay | Admitting: Internal Medicine

## 2017-03-16 DIAGNOSIS — I10 Essential (primary) hypertension: Secondary | ICD-10-CM

## 2017-03-16 MED ORDER — LISINOPRIL 10 MG PO TABS: 10.0000 mg | ORAL_TABLET | Freq: Every day | ORAL | 0 refills | Status: DC

## 2017-03-16 NOTE — Telephone Encounter (Signed)
Copied from [place] [phone]. Topic: Inquiry >> Mar 16, 2017  9:04 AM Conception Chancy, NT wrote: Reason for CRM: patient states that [facility] keeps calling her and stating they are trying to fax over a request for her Lisinopril but the fax will not go through. Pt was told to call the office and give them their fax number to get that RX filled.   [phone] fax number

## 2017-03-16 NOTE — Progress Notes (Signed)
  Oncology Nurse Navigator Documentation  Navigator Location: [facility] (03/16/17 1300)   )Navigator Encounter Type: Telephone (03/16/17 1300) Telephone: Incoming Call (03/16/17 1300)                       Barriers/Navigation Needs: Coordination of Care (03/16/17 1300)   Interventions: Coordination of Care (03/16/17 1300)                      Time Spent with Patient: 30 (03/16/17 1300)   Patient returned my call.  She would like to try acupuncture.  She has chosen Dr. Birdie Sons.  I have faxed approval through [facility] [employer] for her appointments.  Survey for acupuncture mailed to patient.

## 2017-03-16 NOTE — Telephone Encounter (Signed)
Patient last seen 02/25/17.

## 2017-05-04 ENCOUNTER — Other Ambulatory Visit: Payer: Self-pay | Admitting: Unknown Physician Specialty

## 2017-05-04 DIAGNOSIS — I10 Essential (primary) hypertension: Secondary | ICD-10-CM

## 2017-05-28 ENCOUNTER — Other Ambulatory Visit: Payer: Self-pay

## 2017-05-28 DIAGNOSIS — C50811 Malignant neoplasm of overlapping sites of right female breast: Secondary | ICD-10-CM

## 2017-05-28 DIAGNOSIS — Z171 Estrogen receptor negative status [ER-]: Secondary | ICD-10-CM

## 2017-05-28 DIAGNOSIS — Z853 Personal history of malignant neoplasm of breast: Secondary | ICD-10-CM

## 2017-07-01 ENCOUNTER — Ambulatory Visit
Admission: RE | Admit: 2017-07-01 | Discharge: 2017-07-01 | Disposition: A | Discharge status: Home / Self Care | Payer: Managed Care, Other (non HMO) | Source: Ambulatory Visit | Attending: General Surgery | Admitting: General Surgery

## 2017-07-01 DIAGNOSIS — Z853 Personal history of malignant neoplasm of breast: Secondary | ICD-10-CM | POA: Insufficient documentation

## 2017-07-01 DIAGNOSIS — C50811 Malignant neoplasm of overlapping sites of right female breast: Secondary | ICD-10-CM

## 2017-07-01 DIAGNOSIS — Z171 Estrogen receptor negative status [ER-]: Secondary | ICD-10-CM

## 2017-07-14 ENCOUNTER — Ambulatory Visit: Payer: Managed Care, Other (non HMO) | Admitting: General Surgery

## 2017-07-14 ENCOUNTER — Encounter: Payer: Self-pay | Admitting: General Surgery

## 2017-07-14 VITALS — BP 130/74 | HR 80 | Resp 12 | Ht 65.0 in | Wt 206.0 lb

## 2017-07-14 DIAGNOSIS — C50811 Malignant neoplasm of overlapping sites of right female breast: Secondary | ICD-10-CM

## 2017-07-14 DIAGNOSIS — Z171 Estrogen receptor negative status [ER-]: Secondary | ICD-10-CM | POA: Diagnosis not present

## 2017-07-14 NOTE — Patient Instructions (Signed)
The patient has been asked to return to the office in one year with a bilateral diagnostic mammogram.The patient is aware to call back for any questions or concerns. 

## 2017-07-14 NOTE — Progress Notes (Signed)
Patient ID: Adrienne [name], female   DOB: [birth year], 57 y.o.   MRN: [id number]  Chief Complaint  Patient presents with  . Follow-up    HPI Adrienne [name] is a 57 y.o. female who presents for a breast evaluation. The most recent mammogram was done on 07/03/2017.  Patient does perform regular self breast checks and gets regular mammograms done.    HPI  Past Medical History:  Diagnosis Date  . Anemia   . Asthma   . BRCA negative 09/22/2014  . Breast cancer ([place]) 2012   LT LUMPECTOMY  . Breast cancer ([place]) 2016   RT LUMPECTOMY  . Breast cyst 2010   cyst- drained  . Breast screening, unspecified   . Constipation   . Dry eyes 2012  . GERD (gastroesophageal reflux disease)   . Hematuria   . Kidney stone   . Lump or mass in breast   . Malignant neoplasm of breast (female), unspecified site 07/08/2014   Right breast, 5 mm, T1a,N0, triple negative; high risk for recurrence on Mammoprint.  . Malignant neoplasm of upper-outer quadrant of female breast ([place]) 05/08/2010   Left breast: T1b, N0, M); triple negative, DCIS present. No chemotherapy. MammoSite radiation.  . Neuropathy    pt states in fingertips and feet  . Obesity, unspecified   . Pancreatic lesion   . Personal history of chemotherapy 2016   BREAST CA  . 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 Personal history of radiation therapy 2012   BREAST CA - MAMMOSITE  . Personal history of radiation therapy 2016   BREAST CA - MAMMOSITE  . Screening for obesity   . Seasonal allergies   . Special screening for malignant neoplasms, colon   . Unspecified essential hypertension     Past Surgical History:  Procedure Laterality Date  . BREAST BIOPSY    . BREAST CYST ASPIRATION Left 2010  . BREAST LUMPECTOMY Left 2012   BREAST CA  . BREAST  LUMPECTOMY Right 2016   BREAST CA  . BREAST LUMPECTOMY WITH AXILLARY LYMPH NODE BIOPSY Right 08/01/2014   Procedure: BREAST LUMPECTOMY WITH AXILLARY LYMPH NODE BIOPSY;  Surgeon: Robert Bellow, MD;  Location: [facility] ORS;  Service: General;  Laterality: Right;  . BREAST MAMMOSITE  April 2012   mammosite placement and removal   . BREAST SURGERY Left 2012   left breast wide excision, sentinel node, MammoSite  . BREAST SURGERY Right 08/06/2014   Wide excision/sentinel node biopsy, T1a, N0 triple negative, MammoSite  . CESAREAN SECTION  1988  . COLONOSCOPY  October 17, 2009   Dr. Dionne Milo, normal study.  Marland Kitchen KIDNEY STONE SURGERY  2003   stones removed by Dr. Quillian Quince- stent placed/removed  . PORTACATH PLACEMENT Left 11/09/2014   Procedure: INSERTION PORT-A-CATH;  Surgeon: Robert Bellow, MD;  Location: [facility] ORS;  Service: General;  Laterality: Left;  . SENTINEL NODE BIOPSY Right 08/01/2014   Procedure: SENTINEL NODE BIOPSY;  Surgeon: Robert Bellow, MD;  Location: [facility] ORS;  Service: General;  Laterality: Right;  . TUBAL LIGATION      Family History  Problem Relation Age of Onset  . Ovarian cancer Mother   . Diabetes Mother   . Cancer Father        prostate  . Diabetes Father   .  Hypertension Brother   . Cancer Maternal Grandmother        bone  . Cancer Maternal Grandfather        lung  . Cancer Paternal Grandmother        lung  . Asthma Paternal Grandfather   . Hypertension Sister   . Breast cancer Maternal Aunt 60    Social History Social History   Tobacco Use  . Smoking status: Never Smoker  . Smokeless tobacco: Never Used  Substance Use Topics  . Alcohol use: Yes    Comment: occasionally  . Drug use: No    Allergies  Allergen Reactions  . Cayenne Pepper [Cayenne] Shortness Of Breath  . Shellfish Allergy Other (See Comments)    Hives and swelling on ears and feet  . Tape Rash    Surgical tape from breast biopsy    Current Outpatient Medications  Medication  Sig Dispense Refill  . albuterol (PROVENTIL HFA;VENTOLIN HFA) 108 (90 Base) MCG/ACT inhaler Inhale 2 puffs into the lungs every 6 (six) hours as needed for wheezing or shortness of breath. 1 Inhaler 3  . aspirin 81 MG tablet Take 81 mg by mouth daily.    . bisacodyl (DULCOLAX) 5 MG EC tablet Take 5 mg by mouth daily as needed for moderate constipation.    . Cholecalciferol (VITAMIN D3) 1000 UNITS CAPS Take 1 capsule by mouth daily.     Marland Kitchen esomeprazole (NEXIUM) 40 MG capsule Take 40 mg by mouth 2 (two) times daily as needed (acid reflux).     Marland Kitchen linaclotide (LINZESS) 145 MCG CAPS capsule Take 1 capsule (145 mcg total) by mouth daily before breakfast. 90 capsule 1  . lisinopril (PRINIVIL,ZESTRIL) 10 MG tablet TAKE 1 TABLET BY MOUTH  DAILY 90 tablet 0  . loratadine (CLARITIN) 10 MG tablet Take 10 mg by mouth daily. Reported on 04/13/2015    . lubiprostone (AMITIZA) 8 MCG capsule Take 1 capsule (8 mcg total) by mouth 2 (two) times daily with a meal. 180 capsule 1  . meclizine (ANTIVERT) 12.5 MG tablet Take 1 tablet (12.5 mg total) by mouth 3 (three) times daily as needed for dizziness. 40 tablet 0  . polyethylene glycol (MIRALAX / GLYCOLAX) packet Take 17 g by mouth every other day.    . sennosides-docusate sodium (SENOKOT-S) 8.6-50 MG tablet Take 1 tablet by mouth daily.     No current facility-administered medications for this visit.    Facility-Administered Medications Ordered in Other Visits  Medication Dose Route Frequency Provider Last Rate Last Dose  . sodium chloride 0.9 % injection 10 mL  10 mL Intravenous PRN Leia Alf, MD   10 mL at 11/10/14 1138    Review of Systems Review of Systems  Constitutional: Negative.   Respiratory: Negative.   Cardiovascular: Negative.     Blood pressure 130/74, pulse 80, resp. rate 12, height _0  (1.651 m), weight 206 lb (93.4 kg).  Physical Exam Physical Exam  Constitutional: She is oriented to person, place, and time. She appears  well-developed and well-nourished.  Eyes: Conjunctivae are normal. No scleral icterus.  Neck: Neck supple.  Cardiovascular: Normal rate, regular rhythm and normal heart sounds.  Pulmonary/Chest: Effort normal and breath sounds normal. Right breast exhibits no inverted nipple, no mass, no nipple discharge, no skin change and no tenderness. Left breast exhibits no inverted nipple, no mass, no nipple discharge, no skin change and no tenderness.    Lymphadenopathy:    She has no cervical adenopathy.  She has no axillary adenopathy.  Neurological: She is alert and oriented to person, place, and time.  Skin: Skin is warm and dry.    Data Reviewed July 01, 2017 bilateral diagnostic mammograms reviewed.  BI-RADS-2.  Postsurgical changes and areas representing fat necrosis noted.  Assessment    Doing well without evidence of recurrent disease.  Triple negative tumor not requiring antiestrogen therapy.    Plan  The patient has been asked to return to the office in one year with a bilateral diagnostic mammogram. The patient is aware to call back for any questions or concerns.    HPI, Physical Exam, Assessment and Plan have been scribed under the direction and in the presence of Hervey Ard, MD.  Gaspar Cola, CMA  I have completed the exam and reviewed the above documentation for accuracy and completeness.  I agree with the above.  Haematologist has been used and any errors in dictation or transcription are unintentional.  Hervey Ard, M.D., F.A.C.S.  Adrienne [name] 07/15/2017, 5:33 PM

## 2017-07-15 ENCOUNTER — Encounter: Payer: Self-pay | Admitting: General Surgery

## 2017-08-03 ENCOUNTER — Other Ambulatory Visit: Payer: Self-pay | Admitting: Unknown Physician Specialty

## 2017-08-03 DIAGNOSIS — I10 Essential (primary) hypertension: Secondary | ICD-10-CM

## 2017-08-05 ENCOUNTER — Other Ambulatory Visit: Payer: Self-pay

## 2017-08-05 DIAGNOSIS — I10 Essential (primary) hypertension: Secondary | ICD-10-CM

## 2017-08-05 MED ORDER — LISINOPRIL 10 MG PO TABS: 10.0000 mg | ORAL_TABLET | Freq: Every day | ORAL | 0 refills | Status: DC

## 2017-08-05 NOTE — Telephone Encounter (Signed)
Patient last seen 02/2017 and has appointment 09/2017.

## 2017-08-20 ENCOUNTER — Inpatient Hospital Stay: Payer: Managed Care, Other (non HMO) | Attending: Oncology

## 2017-08-20 DIAGNOSIS — C50811 Malignant neoplasm of overlapping sites of right female breast: Secondary | ICD-10-CM

## 2017-08-20 DIAGNOSIS — G620 Drug-induced polyneuropathy: Secondary | ICD-10-CM | POA: Insufficient documentation

## 2017-08-20 DIAGNOSIS — D649 Anemia, unspecified: Secondary | ICD-10-CM | POA: Insufficient documentation

## 2017-08-20 DIAGNOSIS — Z171 Estrogen receptor negative status [ER-]: Secondary | ICD-10-CM

## 2017-08-20 DIAGNOSIS — D472 Monoclonal gammopathy: Secondary | ICD-10-CM

## 2017-08-20 DIAGNOSIS — Z853 Personal history of malignant neoplasm of breast: Secondary | ICD-10-CM | POA: Diagnosis not present

## 2017-08-20 LAB — CBC WITH DIFFERENTIAL/PLATELET
BASOS ABS: 0 10*3/uL (ref 0–0.1)
Basophils Relative: 1 %
EOS ABS: 0.2 10*3/uL (ref 0–0.7)
Eosinophils Relative: 3 %
HCT: 31.3 % — ABNORMAL LOW (ref 35.0–47.0)
HEMOGLOBIN: 10.6 g/dL — AB (ref 12.0–16.0)
LYMPHS ABS: 1.5 10*3/uL (ref 1.0–3.6)
Lymphocytes Relative: 23 %
MCH: 33 pg (ref 26.0–34.0)
MCHC: 34 g/dL (ref 32.0–36.0)
MCV: 97 fL (ref 80.0–100.0)
Monocytes Absolute: 0.4 10*3/uL (ref 0.2–0.9)
Monocytes Relative: 7 %
NEUTROS PCT: 66 %
Neutro Abs: 4.5 10*3/uL (ref 1.4–6.5)
PLATELETS: 215 10*3/uL (ref 150–440)
RBC: 3.22 MIL/uL — ABNORMAL LOW (ref 3.80–5.20)
RDW: 15.4 % — ABNORMAL HIGH (ref 11.5–14.5)
WBC: 6.7 10*3/uL (ref 3.6–11.0)

## 2017-08-20 LAB — COMPREHENSIVE METABOLIC PANEL
ALT: 13 U/L — ABNORMAL LOW (ref 14–54)
AST: 18 U/L (ref 15–41)
Albumin: 4.2 g/dL (ref 3.5–5.0)
Alkaline Phosphatase: 78 U/L (ref 38–126)
Anion gap: 9 (ref 5–15)
BUN: 21 mg/dL — ABNORMAL HIGH (ref 6–20)
CHLORIDE: 107 mmol/L (ref 101–111)
CO2: 24 mmol/L (ref 22–32)
Calcium: 9.3 mg/dL (ref 8.9–10.3)
Creatinine, Ser: 0.93 mg/dL (ref 0.44–1.00)
Glucose, Bld: 90 mg/dL (ref 65–99)
POTASSIUM: 3.7 mmol/L (ref 3.5–5.1)
SODIUM: 140 mmol/L (ref 135–145)
Total Bilirubin: 0.8 mg/dL (ref 0.3–1.2)
Total Protein: 7.9 g/dL (ref 6.5–8.1)

## 2017-08-21 LAB — MULTIPLE MYELOMA PANEL, SERUM
ALBUMIN/GLOB SERPL: 1 (ref 0.7–1.7)
ALPHA 1: 0.2 g/dL (ref 0.0–0.4)
ALPHA2 GLOB SERPL ELPH-MCNC: 0.8 g/dL (ref 0.4–1.0)
Albumin SerPl Elph-Mcnc: 3.5 g/dL (ref 2.9–4.4)
B-Globulin SerPl Elph-Mcnc: 0.9 g/dL (ref 0.7–1.3)
Gamma Glob SerPl Elph-Mcnc: 1.6 g/dL (ref 0.4–1.8)
Globulin, Total: 3.6 g/dL (ref 2.2–3.9)
IGG (IMMUNOGLOBIN G), SERUM: 1816 mg/dL — AB (ref 700–1600)
IgA: 133 mg/dL (ref 87–352)
IgM (Immunoglobulin M), Srm: 45 mg/dL (ref 26–217)
M Protein SerPl Elph-Mcnc: 1 g/dL — ABNORMAL HIGH
TOTAL PROTEIN ELP: 7.1 g/dL (ref 6.0–8.5)

## 2017-08-21 LAB — CANCER ANTIGEN 27.29: CA 27.29: 15.2 U/mL (ref 0.0–38.6)

## 2017-08-21 LAB — KAPPA/LAMBDA LIGHT CHAINS
KAPPA FREE LGHT CHN: 21.9 mg/L — AB (ref 3.3–19.4)
KAPPA, LAMDA LIGHT CHAIN RATIO: 1.83 — AB (ref 0.26–1.65)
Lambda free light chains: 12 mg/L (ref 5.7–26.3)

## 2017-08-27 ENCOUNTER — Inpatient Hospital Stay (HOSPITAL_BASED_OUTPATIENT_CLINIC_OR_DEPARTMENT_OTHER): Payer: Managed Care, Other (non HMO) | Admitting: Oncology

## 2017-08-27 ENCOUNTER — Encounter: Payer: Self-pay | Admitting: Oncology

## 2017-08-27 VITALS — BP 120/77 | HR 78 | Temp 97.5°F | Resp 16 | Wt 206.0 lb

## 2017-08-27 DIAGNOSIS — D472 Monoclonal gammopathy: Secondary | ICD-10-CM

## 2017-08-27 DIAGNOSIS — Z853 Personal history of malignant neoplasm of breast: Secondary | ICD-10-CM

## 2017-08-27 DIAGNOSIS — D649 Anemia, unspecified: Secondary | ICD-10-CM | POA: Diagnosis not present

## 2017-08-27 DIAGNOSIS — Z171 Estrogen receptor negative status [ER-]: Secondary | ICD-10-CM

## 2017-08-27 DIAGNOSIS — C50811 Malignant neoplasm of overlapping sites of right female breast: Secondary | ICD-10-CM

## 2017-08-27 DIAGNOSIS — G620 Drug-induced polyneuropathy: Secondary | ICD-10-CM

## 2017-08-27 MED ORDER — PREGABALIN 50 MG PO CAPS: ORAL_CAPSULE | ORAL | 0 refills | Status: DC

## 2017-08-27 NOTE — Progress Notes (Signed)
[facility] OFFICE PROGRESS NOTE  Patient Care Team: Kathrine Haddock, NP as PCP - General (Nurse Practitioner) Bary Castilla, Forest Gleason, MD (General Surgery) Cammie Sickle, MD as Consulting Physician (Internal Medicine)   SUMMARY OF ONCOLOGIC HISTORY:  Oncology History   # MAY 2016- RIGHT BREAST CANCER- Triple negative [T=0.5cm;G-3; margins-Neg]; Mammaprint- 0.814/molecular basal type [5 year risk- 22%; 10 year risk-29%] AC q3 W- taxol q w x 8/ 12 [sec to PN; finished Feb 2017].   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  Overall she tells me she is doing well.  She denies any unusual headaches or vision changes.  Denies dizziness.  Does admit to continued peripheral neuropathy worse in her feet.  Tried gabapentin which was too sedating.  Has recently tried acupuncture but this is not helped.  Denies any new lumps or bumps.  Was seen by Dr. Fleet Contras where a breast exam was performed.  Recently had mammogram and was told "everything looked good".    REVIEW OF SYSTEMS:   Review of Systems  Constitutional: Negative.  Negative for chills, fever, malaise/fatigue and weight loss.  HENT: Negative for congestion and ear pain.   Eyes: Negative.  Negative for blurred vision and double vision.  Respiratory: Negative.  Negative for cough, sputum production and shortness of breath.   Cardiovascular: Negative.  Negative for chest pain, palpitations and leg swelling.  Gastrointestinal: Negative.  Negative for abdominal pain, constipation, diarrhea, nausea and vomiting.  Genitourinary:  Negative for dysuria, frequency and urgency.  Musculoskeletal: Negative for back pain and falls.  Skin: Negative.  Negative for rash.  Neurological: Positive for sensory change (Since chemotherapy feet > hands). Negative for weakness and headaches.  Endo/Heme/Allergies: Negative.  Does not bruise/bleed easily.  Psychiatric/Behavioral: Negative.  Negative for depression. The patient is not nervous/anxious and does not have insomnia.      PAST MEDICAL HISTORY :  Past Medical History:  Diagnosis Date  . Anemia   . Asthma   . BRCA negative 09/22/2014  . Breast cancer ([place]) 2012   LT LUMPECTOMY  . Breast cancer ([place]) 2016   RT LUMPECTOMY  . Breast cyst 2010   cyst- drained  . Breast screening, unspecified   . Constipation   . Dry eyes 2012  . GERD (gastroesophageal reflux disease)   . Hematuria   . Kidney stone   . Lump or mass in breast   . Malignant neoplasm of breast (female), unspecified site 07/08/2014   Right breast, 5 mm, T1a,N0, triple negative; high risk for recurrence on Mammoprint.  . Malignant neoplasm of upper-outer quadrant of female breast ([place]) 05/08/2010   Left breast: T1b, N0, M); triple negative, DCIS present. No chemotherapy. MammoSite radiation.  . Neuropathy    pt states in fingertips and feet  . Obesity, unspecified   . Pancreatic lesion   . Personal history of chemotherapy 2016   BREAST CA  . Personal history of malignant neoplasm of breast 2012   has been treated with left breast wide excision and radiation therapy; Patient has DCIS as well as a 7  mm infiltrating mammary carcinoma triple negative removed on 05-08-10  . Personal history of radiation therapy 2012   BREAST CA - MAMMOSITE  . Personal history of radiation therapy 2016   BREAST CA - MAMMOSITE  . Screening for obesity   . Seasonal allergies   . Special screening for malignant neoplasms, colon   . Unspecified essential hypertension     PAST SURGICAL HISTORY :   Past Surgical History:   Procedure Laterality Date  . BREAST BIOPSY    . BREAST CYST ASPIRATION Left 2010  . BREAST LUMPECTOMY Left 2012   BREAST CA  . BREAST LUMPECTOMY Right 2016   BREAST CA  . BREAST LUMPECTOMY WITH AXILLARY LYMPH NODE BIOPSY Right 08/01/2014   Procedure: BREAST LUMPECTOMY WITH AXILLARY LYMPH NODE BIOPSY;  Surgeon: Robert Bellow, MD;  Location: [facility] ORS;  Service: General;  Laterality: Right;  . BREAST MAMMOSITE  April 2012   mammosite placement and removal   . BREAST SURGERY Left 2012   left breast wide excision, sentinel node, MammoSite  . BREAST SURGERY Right 08/06/2014   Wide excision/sentinel node biopsy, T1a, N0 triple negative, MammoSite  . CESAREAN SECTION  1988  . COLONOSCOPY  October 17, 2009   Dr. Dionne Milo, normal study.  Marland Kitchen KIDNEY STONE SURGERY  2003   stones removed by Dr. Quillian Quince- stent placed/removed  . PORTACATH PLACEMENT Left 11/09/2014   Procedure: INSERTION PORT-A-CATH;  Surgeon: Robert Bellow, MD;  Location: [facility] OR[facility];  Service: General;  Laterality: Left;  . SENTINEL NODE BIOPSY Right 08/01/2014   Procedure: SENTINEL NODE BIOPSY;  Surgeon: Robert Bellow, MD;  Location: [facility] OR[facility];  Service: General;  Laterality: Right;  . TUBAL LIGATION      FAMILY HISTORY :   Family History  Problem Relation Age of Onset  . Ovarian cancer Mother   . Diabetes Mother   . Cancer Father        prostate  . Diabetes Father   . Hypertension Brother   . Cancer Maternal Grandmother        bone  . Cancer Maternal Grandfather        lung  . Cancer Paternal Grandmother        lung  . Asthma Paternal Grandfather   . Hypertension Sister   . Breast cancer Maternal Aunt 56    SOCIAL HISTORY:   Social History   Tobacco Use  . Smoking status: Never Smoker  . Smokeless tobacco: Never Used  Substance Use Topics  . Alcohol use: Yes    Comment: occasionally  . Drug use: No    ALLERGIES:  is allergic to cayenne pepper [cayenne]; shellfish allergy; and tape.  MEDICATIONS:   Current Outpatient Medications  Medication Sig Dispense Refill  . albuterol (PROVENTIL HFA;VENTOLIN HFA) 108 (90 Base) MCG/ACT inhaler Inhale 2 puffs into the lungs every 6 (six) hours as needed for wheezing or shortness of breath. 1 Inhaler 3  . aspirin 81 MG tablet Take 81 mg by mouth daily.    . bisacodyl (DULCOLAX) 5 MG EC tablet Take 5 mg by mouth daily as needed for moderate constipation.    . Cholecalciferol (VITAMIN D3) 1000 UNITS CAPS Take 1 capsule by mouth daily.     Marland Kitchen esomeprazole (NEXIUM) 40 MG capsule Take 40 mg by mouth 2 (two) times daily as needed (acid reflux).     Marland Kitchen lisinopril (PRINIVIL,ZESTRIL) 10 MG tablet Take 1 tablet (10 mg total) by mouth daily. 90 tablet 0  . loratadine (CLARITIN)  10 MG tablet Take 10 mg by mouth daily. Reported on 04/13/2015    . meclizine (ANTIVERT) 12.5 MG tablet Take 1 tablet (12.5 mg total) by mouth 3 (three) times daily as needed for dizziness. 40 tablet 0  . polyethylene glycol (MIRALAX / GLYCOLAX) packet Take 17 g by mouth every other day.    . sennosides-docusate sodium (SENOKOT-S) 8.6-50 MG tablet Take 1 tablet by mouth daily.    . pregabalin (LYRICA) 50 MG capsule 1 tablet daily before bedtime 7 capsule 0   No current facility-administered medications for this visit.    Facility-Administered Medications Ordered in Other Visits  Medication Dose Route Frequency Provider Last Rate Last Dose  . sodium chloride 0.9 % injection 10 mL  10 mL Intravenous PRN Leia Alf, MD   10 mL at 11/10/14 1138    PHYSICAL EXAMINATION: ECOG PERFORMANCE STATUS: 1 - Symptomatic but completely ambulatory  BP 120/77 (BP Location: Left Arm, Patient Position: Sitting)   Pulse 78   Temp (!) 97.5 F (36.4 C) (Tympanic)   Resp 16   Wt 206 lb (93.4 kg)   LMP  (LMP Unknown)   SpO2 100%   BMI 34.28 kg/m   Filed Weights   08/27/17 0831 08/27/17 0835  Weight: 206 lb (93.4 kg) 206 lb (93.4 kg)    Physical Exam  Constitutional: She is oriented to person,  place, and time and well-developed, well-nourished, and in no distress. Vital signs are normal.  HENT:  Head: Normocephalic and atraumatic.  Eyes: Pupils are equal, round, and reactive to light.  Neck: Normal range of motion.  Cardiovascular: Normal rate, regular rhythm and normal heart sounds.  No murmur heard. Pulmonary/Chest: Effort normal and breath sounds normal. She has no wheezes.  Abdominal: Soft. Normal appearance and bowel sounds are normal. She exhibits no distension. There is no tenderness.  Musculoskeletal: Normal range of motion. She exhibits no edema.  Neurological: She is alert and oriented to person, place, and time. Gait normal.  Skin: Skin is warm and dry. No rash noted.  Psychiatric: Mood, memory, affect and judgment normal.    LABORATORY DATA:  I have reviewed the data as listed    Component Value Date/Time   NA 140 08/20/2017 0812   NA 139 02/25/2017 1139   NA 140 07/16/2011 2130   K 3.7 08/20/2017 0812   K 3.7 07/16/2011 2130   CL 107 08/20/2017 0812   CL 106 07/16/2011 2130   CO2 24 08/20/2017 0812   CO2 27 07/16/2011 2130   GLUCOSE 90 08/20/2017 0812   GLUCOSE 93 07/16/2011 2130   BUN 21 (H) 08/20/2017 0812   BUN 13 02/25/2017 1139   BUN 16 07/16/2011 2130   CREATININE 0.93 08/20/2017 0812   CREATININE 0.95 07/16/2011 2130   CALCIUM 9.3 08/20/2017 0812   CALCIUM 9.0 07/16/2011 2130   PROT 7.9 08/20/2017 0812   PROT 7.3 02/25/2017 1139   ALBUMIN 4.2 08/20/2017 0812   ALBUMIN 4.0 02/25/2017 1139   AST 18 08/20/2017 0812   ALT 13 (L) 08/20/2017 0812   ALKPHOS 78 08/20/2017 0812   BILITOT 0.8 08/20/2017 0812   BILITOT 0.4 02/25/2017 1139   GFRNONAA >60 08/20/2017 0812   GFRNONAA >60 07/16/2011 2130   GFRAA >60 08/20/2017 0812   GFRAA >60 07/16/2011 2130    No results found for: SPEP, UPEP  Lab Results  Component Value Date   WBC 6.7 08/20/2017   NEUTROABS 4.5 08/20/2017   HGB 10.6 (L) 08/20/2017  HCT 31.3 (L) 08/20/2017   MCV 97.0  08/20/2017   PLT 215 08/20/2017      Chemistry      Component Value Date/Time   NA 140 08/20/2017 0812   NA 139 02/25/2017 1139   NA 140 07/16/2011 2130   K 3.7 08/20/2017 0812   K 3.7 07/16/2011 2130   CL 107 08/20/2017 0812   CL 106 07/16/2011 2130   CO2 24 08/20/2017 0812   CO2 27 07/16/2011 2130   BUN 21 (H) 08/20/2017 0812   BUN 13 02/25/2017 1139   BUN 16 07/16/2011 2130   CREATININE 0.93 08/20/2017 0812   CREATININE 0.95 07/16/2011 2130      Component Value Date/Time   CALCIUM 9.3 08/20/2017 0812   CALCIUM 9.0 07/16/2011 2130   ALKPHOS 78 08/20/2017 0812   AST 18 08/20/2017 0812   ALT 13 (L) 08/20/2017 0812   BILITOT 0.8 08/20/2017 0812   BILITOT 0.4 02/25/2017 1139       ASSESSMENT & PLAN:   RIGHT BREAST CANCER STAGE I - Triple negative s/p adjuvant AC x 4 followed by weekly Taxol x 8 [stopped d/t PN Jan 2017]. No concerns for recurrence.  Last mammogram from April 2019 BI-RADS Category 2 for benign findings.  CA 27. 29- 15.2 (15.1).  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  Intolerant to gabapentin d/t sedation.  States acupuncture was not helpful.  We will try low-dose Lyrica to see if this helps.  Patient agreeable. RX lyrical 50 mg daily before bedtime.  Only prescribed a week's worth and will be back in touch to see if this is helping.  Dizziness/vertigo: Does not complain of this today.  Completely resolved.  MGUS: No concerns for progression.  M protein remains at 1.  Kappa lambda light ratio is elevated 1.83 (slight decrease from December). Denies bony pain. Kidney function normal, calcium normal and anemia is at baseline. Improving.   Anemia: At baseline.  Hemoglobin 10.6 today. (10.3).  RTC 6 months with labs (Myeloma, Ca 27.29, CBC, BMP) 1 week prior and MD assessment.  Greater than 50% was spent in counseling and coordination of care with this patient including but not limited to discussion of the relevant topics above (See A&P)  including, but not limited to diagnosis and management of acute and chronic medical conditions.    Jacquelin Hawking, NP 08/27/2017 9:57 AM

## 2017-09-14 ENCOUNTER — Other Ambulatory Visit (HOSPITAL_COMMUNITY)
Admission: RE | Admit: 2017-09-14 | Discharge: 2017-09-14 | Disposition: A | Discharge status: Home / Self Care | Payer: Managed Care, Other (non HMO) | Source: Ambulatory Visit | Attending: Unknown Physician Specialty | Admitting: Unknown Physician Specialty

## 2017-09-14 ENCOUNTER — Ambulatory Visit (INDEPENDENT_AMBULATORY_CARE_PROVIDER_SITE_OTHER): Payer: Managed Care, Other (non HMO) | Admitting: Unknown Physician Specialty

## 2017-09-14 ENCOUNTER — Encounter: Payer: Self-pay | Admitting: Unknown Physician Specialty

## 2017-09-14 VITALS — BP 132/78 | HR 69 | Temp 98.1°F | Ht 64.6 in | Wt 201.4 lb

## 2017-09-14 DIAGNOSIS — Z0000 Encounter for general adult medical examination without abnormal findings: Secondary | ICD-10-CM

## 2017-09-14 DIAGNOSIS — I10 Essential (primary) hypertension: Secondary | ICD-10-CM

## 2017-09-14 DIAGNOSIS — C50811 Malignant neoplasm of overlapping sites of right female breast: Secondary | ICD-10-CM

## 2017-09-14 DIAGNOSIS — Z171 Estrogen receptor negative status [ER-]: Secondary | ICD-10-CM

## 2017-09-14 DIAGNOSIS — Z23 Encounter for immunization: Secondary | ICD-10-CM

## 2017-09-14 DIAGNOSIS — Z0001 Encounter for general adult medical examination with abnormal findings: Secondary | ICD-10-CM | POA: Diagnosis not present

## 2017-09-14 DIAGNOSIS — F341 Dysthymic disorder: Secondary | ICD-10-CM | POA: Diagnosis not present

## 2017-09-14 NOTE — Assessment & Plan Note (Signed)
Note higher PHQ 9 score.  Pt wishes no treatment and plans to make some lifestyle changes to help

## 2017-09-14 NOTE — Assessment & Plan Note (Signed)
Regular visits with Dr. Fleet Contras

## 2017-09-14 NOTE — Addendum Note (Signed)
Addended by: Georgina Peer on: 09/14/2017 11:25 AM   Modules accepted: Orders

## 2017-09-14 NOTE — Patient Instructions (Signed)
Tdap Vaccine (Tetanus, Diphtheria and Pertussis): What You Need to Know 1. Why get vaccinated? Tetanus, diphtheria and pertussis are very serious diseases. Tdap vaccine can protect us from these diseases. And, Tdap vaccine given to pregnant women can protect newborn babies against pertussis. TETANUS (Lockjaw) is rare in the [place] today. It causes painful muscle tightening and stiffness, usually all over the body.  It can lead to tightening of muscles in the head and neck so you can't open your mouth, swallow, or sometimes even breathe. Tetanus kills about 1 out of 10 people who are infected even after receiving the best medical care.  DIPHTHERIA is also rare in the [place] today. It can cause a thick coating to form in the back of the throat.  It can lead to breathing problems, heart failure, paralysis, and death.  PERTUSSIS (Whooping Cough) causes severe coughing spells, which can cause difficulty breathing, vomiting and disturbed sleep.  It can also lead to weight loss, incontinence, and rib fractures. Up to 2 in 100 adolescents and 5 in 100 adults with pertussis are hospitalized or have complications, which could include pneumonia or death.  These diseases are caused by bacteria. Diphtheria and pertussis are spread from person to person through secretions from coughing or sneezing. Tetanus enters the body through cuts, scratches, or wounds. Before vaccines, as many as 200,000 cases of diphtheria, 200,000 cases of pertussis, and hundreds of cases of tetanus, were reported in the [place] each year. Since vaccination began, reports of cases for tetanus and diphtheria have dropped by about 99% and for pertussis by about 80%. 2. Tdap vaccine Tdap vaccine can protect adolescents and adults from tetanus, diphtheria, and pertussis. One dose of Tdap is routinely given at age 11 or 12. People who did not get Tdap at that age should get it as soon as possible. Tdap is especially  important for healthcare professionals and anyone having close contact with a baby younger than 12 months. Pregnant women should get a dose of Tdap during every pregnancy, to protect the newborn from pertussis. Infants are most at risk for severe, life-threatening complications from pertussis. Another vaccine, called Td, protects against tetanus and diphtheria, but not pertussis. A Td booster should be given every 10 years. Tdap may be given as one of these boosters if you have never gotten Tdap before. Tdap may also be given after a severe cut or burn to prevent tetanus infection. Your doctor or the person giving you the vaccine can give you more information. Tdap may safely be given at the same time as other vaccines. 3. Some people should not get this vaccine  A person who has ever had a life-threatening allergic reaction after a previous dose of any diphtheria, tetanus or pertussis containing vaccine, OR has a severe allergy to any part of this vaccine, should not get Tdap vaccine. Tell the person giving the vaccine about any severe allergies.  Anyone who had coma or long repeated seizures within 7 days after a childhood dose of DTP or DTaP, or a previous dose of Tdap, should not get Tdap, unless a cause other than the vaccine was found. They can still get Td.  Talk to your doctor if you: ? have seizures or another nervous system problem, ? had severe pain or swelling after any vaccine containing diphtheria, tetanus or pertussis, ? ever had a condition called Guillain-Barr Syndrome (GBS), ? aren't feeling well on the day the shot is scheduled. 4. Risks With any medicine, including   vaccines, there is a chance of side effects. These are usually mild and go away on their own. Serious reactions are also possible but are rare. Most people who get Tdap vaccine do not have any problems with it. Mild problems following Tdap: (Did not interfere with activities)  Pain where the shot was given (about  3 in 4 adolescents or 2 in 3 adults)  Redness or swelling where the shot was given (about 1 person in 5)  Mild fever of at least 100.4F (up to about 1 in 25 adolescents or 1 in 100 adults)  Headache (about 3 or 4 people in 10)  Tiredness (about 1 person in 3 or 4)  Nausea, vomiting, diarrhea, stomach ache (up to 1 in 4 adolescents or 1 in 10 adults)  Chills, sore joints (about 1 person in 10)  Body aches (about 1 person in 3 or 4)  Rash, swollen glands (uncommon)  Moderate problems following Tdap: (Interfered with activities, but did not require medical attention)  Pain where the shot was given (up to 1 in 5 or 6)  Redness or swelling where the shot was given (up to about 1 in 16 adolescents or 1 in 12 adults)  Fever over 102F (about 1 in 100 adolescents or 1 in 250 adults)  Headache (about 1 in 7 adolescents or 1 in 10 adults)  Nausea, vomiting, diarrhea, stomach ache (up to 1 or 3 people in 100)  Swelling of the entire arm where the shot was given (up to about 1 in 500).  Severe problems following Tdap: (Unable to perform usual activities; required medical attention)  Swelling, severe pain, bleeding and redness in the arm where the shot was given (rare).  Problems that could happen after any vaccine:  People sometimes faint after a medical procedure, including vaccination. Sitting or lying down for about 15 minutes can help prevent fainting, and injuries caused by a fall. Tell your doctor if you feel dizzy, or have vision changes or ringing in the ears.  Some people get severe pain in the shoulder and have difficulty moving the arm where a shot was given. This happens very rarely.  Any medication can cause a severe allergic reaction. Such reactions from a vaccine are very rare, estimated at fewer than 1 in a million doses, and would happen within a few minutes to a few hours after the vaccination. As with any medicine, there is a very remote chance of a vaccine  causing a serious injury or death. The safety of vaccines is always being monitored. For more information, visit: www.cdc.gov/vaccinesafety/ 5. What if there is a serious problem? What should I look for? Look for anything that concerns you, such as signs of a severe allergic reaction, very high fever, or unusual behavior. Signs of a severe allergic reaction can include hives, swelling of the face and throat, difficulty breathing, a fast heartbeat, dizziness, and weakness. These would usually start a few minutes to a few hours after the vaccination. What should I do?  If you think it is a severe allergic reaction or other emergency that can't wait, call 9-1-1 or get the person to the nearest hospital. Otherwise, call your doctor.  Afterward, the reaction should be reported to the Vaccine Adverse Event Reporting System (VAERS). Your doctor might file this report, or you can do it yourself through the VAERS web site at www.vaers.hhs.gov, or by calling 1-800-822-7967. ? VAERS does not give medical advice. 6. The National Vaccine Injury Compensation Program The National   Vaccine Injury Compensation Program (VICP) is a federal program that was created to compensate people who may have been injured by certain vaccines. Persons who believe they may have been injured by a vaccine can learn about the program and about filing a claim by calling 1-800-338-2382 or visiting the VICP website at www.hrsa.gov/vaccinecompensation. There is a time limit to file a claim for compensation. 7. How can I learn more?  Ask your doctor. He or she can give you the vaccine package insert or suggest other sources of information.  Call your local or state health department.  Contact the Centers for Disease Control and Prevention (CDC): ? Call 1-800-232-4636 (1-800-CDC-INFO) or ? Visit CDC's website at www.cdc.gov/vaccines CDC Tdap Vaccine VIS (05/03/13) This information is not intended to replace advice given to you by your  health care provider. Make sure you discuss any questions you have with your health care provider. Document Released: 08/26/2011 Document Revised: 11/15/2015 Document Reviewed: 11/15/2015 Elsevier Interactive Patient Education  2017 Elsevier Inc.  

## 2017-09-14 NOTE — Progress Notes (Signed)
BP 132/78   Pulse 69   Temp 98.1 F (36.7 C) (Oral)   Ht 5' 4.6" (1.641 m)   Wt 201 lb 6.4 oz (91.4 kg)   LMP  (LMP Unknown)   SpO2 98%   BMI 33.93 kg/m    Subjective:    Patient ID: Adrienne [name], female    DOB: 1960/11/25, 57 y.o.   MRN: [id number]  HPI: Adrienne [name] is a 57 y.o. female  Chief Complaint  Patient presents with  . Annual Exam    Depression: Pt states she has gotten away from doing some things that helps mood such as doing crafts  Depression screen [facility] 2/9 09/14/2017 11/03/2016 08/29/2016 08/27/2015 04/16/2015  Decreased Interest 3 0 0 0 0  Down, Depressed, Hopeless 2 0 0 0 0  PHQ - 2 Score 5 0 0 0 0  Altered sleeping 1 - - - -  Tired, decreased energy 2 - - - -  Change in appetite 0 - - - -  Feeling bad or failure about yourself  2 - - - -  Trouble concentrating 0 - - - -  Moving slowly or fidgety/restless 1 - - - -  Suicidal thoughts 1 - - - -  PHQ-9 Score 12 - - - -    Hypertension Using medications without difficulty Average home BPs 120/79 yesterday and is typical   No problems or lightheadedness No chest pain with exertion or shortness of breath No Edema   Hyperlipidemia The 10-year ASCVD risk score Mikey Bussing DC Jr., et al., 2013) is: 6.3%   Values used to calculate the score:     Age: 36 years     Sex: Female     Is Non-Hispanic African American: Yes     Diabetic: No     Tobacco smoker: No     Systolic Blood Pressure: 818 mmHg     Is BP treated: Yes     HDL Cholesterol: 64 mg/dL     Total Cholesterol: 198 mg/dL  Social History   Socioeconomic History  . Marital status: Single    Spouse name: Not on file  . Number of children: Not on file  . Years of education: Not on file  . Highest education level: Not on file  Occupational History  . Not on file  Social Needs  . Financial resource strain: Not on file  . Food insecurity:    Worry: Not on file    Inability: Not on file  . Transportation needs:    Medical: Not on file   Non-medical: Not on file  Tobacco Use  . Smoking status: Never Smoker  . Smokeless tobacco: Never Used  Substance and Sexual Activity  . Alcohol use: Yes    Comment: occasionally  . Drug use: No  . Sexual activity: Yes  Lifestyle  . Physical activity:    Days per week: Not on file    Minutes per session: Not on file  . Stress: Not on file  Relationships  . Social connections:    Talks on phone: Not on file    Gets together: Not on file    Attends religious service: Not on file    Active member of club or organization: Not on file    Attends meetings of clubs or organizations: Not on file    Relationship status: Not on file  . Intimate partner violence:    Fear of current or ex partner: Not on file    Emotionally  abused: Not on file    Physically abused: Not on file    Forced sexual activity: Not on file  Other Topics Concern  . Not on file  Social History Narrative  . Not on file   Family History  Problem Relation Age of Onset  . Ovarian cancer Mother   . Diabetes Mother   . Cancer Father        prostate  . Diabetes Father   . Hypertension Brother   . Cancer Maternal Grandmother        bone  . Cancer Maternal Grandfather        lung  . Cancer Paternal Grandmother        lung  . Asthma Paternal Grandfather   . Hypertension Sister   . Breast cancer Maternal Aunt 60   Past Medical History:  Diagnosis Date  . Anemia   . Asthma   . BRCA negative 09/22/2014  . Breast cancer ([place]) 2012   LT LUMPECTOMY  . Breast cancer ([place]) 2016   RT LUMPECTOMY  . Breast cyst 2010   cyst- drained  . Breast screening, unspecified   . Constipation   . Dry eyes 2012  . GERD (gastroesophageal reflux disease)   . Hematuria   . Kidney stone   . Lump or mass in breast   . Malignant neoplasm of breast (female), unspecified site 07/08/2014   Right breast, 5 mm, T1a,N0, triple negative; high risk for recurrence on Mammoprint.  . Malignant neoplasm of upper-outer quadrant of female  breast ([place]) 05/08/2010   Left breast: T1b, N0, M); triple negative, DCIS present. No chemotherapy. MammoSite radiation.  . Neuropathy    pt states in fingertips and feet  . Obesity, unspecified   . Pancreatic lesion   . Personal history of chemotherapy 2016   BREAST CA  . 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 Personal history of radiation therapy 2012   BREAST CA - MAMMOSITE  . Personal history of radiation therapy 2016   BREAST CA - MAMMOSITE  . Screening for obesity   . Seasonal allergies   . Special screening for malignant neoplasms, colon   . Unspecified essential hypertension    Past Surgical History:  Procedure Laterality Date  . BREAST BIOPSY    . BREAST CYST ASPIRATION Left 2010  . BREAST LUMPECTOMY Left 2012   BREAST CA  . BREAST LUMPECTOMY Right 2016   BREAST CA  . BREAST LUMPECTOMY WITH AXILLARY LYMPH NODE BIOPSY Right 08/01/2014   Procedure: BREAST LUMPECTOMY WITH AXILLARY LYMPH NODE BIOPSY;  Surgeon: Robert Bellow, MD;  Location: [facility] ORS;  Service: General;  Laterality: Right;  . BREAST MAMMOSITE  April 2012   mammosite placement and removal   . BREAST SURGERY Left 2012   left breast wide excision, sentinel node, MammoSite  . BREAST SURGERY Right 08/06/2014   Wide excision/sentinel node biopsy, T1a, N0 triple negative, MammoSite  . CESAREAN SECTION  1988  . COLONOSCOPY  October 17, 2009   Dr. Dionne Milo, normal study.  Marland Kitchen KIDNEY STONE SURGERY  2003   stones removed by Dr. Quillian Quince- stent placed/removed  . PORTACATH PLACEMENT Left 11/09/2014   Procedure: INSERTION PORT-A-CATH;  Surgeon: Robert Bellow, MD;  Location: [facility] ORS;  Service: General;  Laterality: Left;  . SENTINEL NODE BIOPSY  Right 08/01/2014   Procedure: SENTINEL NODE BIOPSY;  Surgeon: Robert Bellow, MD;   Location: [facility] OR[facility];  Service: General;  Laterality: Right;  . TUBAL LIGATION      Relevant past medical, surgical, family and social history reviewed and updated as indicated. Interim medical history since our last visit reviewed. Allergies and medications reviewed and updated.  Review of Systems  Per HPI unless specifically indicated above     Objective:    BP 132/78   Pulse 69   Temp 98.1 F (36.7 C) (Oral)   Ht 5' 4.6" (1.641 m)   Wt 201 lb 6.4 oz (91.4 kg)   LMP  (LMP Unknown)   SpO2 98%   BMI 33.93 kg/m   Wt Readings from Last 3 Encounters:  09/14/17 201 lb 6.4 oz (91.4 kg)  08/27/17 206 lb (93.4 kg)  07/14/17 206 lb (93.4 kg)    Physical Exam  Constitutional: She is oriented to person, place, and time. She appears well-developed and well-nourished.  HENT:  Head: Normocephalic and atraumatic.  Eyes: Pupils are equal, round, and reactive to light. Right eye exhibits no discharge. Left eye exhibits no discharge. No scleral icterus.  Neck: Normal range of motion. Neck supple. Carotid bruit is not present. No thyromegaly present.  Cardiovascular: Normal rate, regular rhythm and normal heart sounds. Exam reveals no gallop and no friction rub.  No murmur heard. Pulmonary/Chest: Effort normal and breath sounds normal. No respiratory distress. She has no wheezes. She has no rales. No breast tenderness or discharge.  Post surgical changes  Abdominal: Soft. Bowel sounds are normal. There is no tenderness. There is no rebound.  Genitourinary: Vagina normal and uterus normal. No breast tenderness or discharge. Cervix exhibits no motion tenderness, no discharge and no friability. Right adnexum displays no mass, no tenderness and no fullness. Left adnexum displays no mass, no tenderness and no fullness.  Musculoskeletal: Normal range of motion.  Lymphadenopathy:    She has no cervical adenopathy.  Neurological: She is alert and oriented to person, place, and time.  Skin: Skin is  warm, dry and intact. No rash noted.  Psychiatric: She has a normal mood and affect. Her speech is normal and behavior is normal. Judgment and thought content normal. Cognition and memory are normal.    Results for orders placed or performed in visit on 08/20/17  Cancer antigen 27.29  Result Value Ref Range   CA 27.29 15.2 0.0 - 38.6 U/mL  Kappa/lambda light chains  Result Value Ref Range   Kappa free light chain 21.9 (H) 3.3 - 19.4 mg/L   Lamda free light chains 12.0 5.7 - 26.3 mg/L   Kappa, lamda light chain ratio 1.83 (H) 0.26 - 1.65  Multiple Myeloma Panel (SPEP&IFE w/QIG)  Result Value Ref Range   IgG (Immunoglobin G), Serum 1,816 (H) 700 - 1,600 mg/dL   IgA 133 87 - 352 mg/dL   IgM (Immunoglobulin M), Srm 45 26 - 217 mg/dL   Total Protein ELP 7.1 6.0 - 8.5 g/dL   Albumin SerPl Elph-Mcnc 3.5 2.9 - 4.4 g/dL   Alpha 1 0.2 0.0 - 0.4 g/dL   Alpha2 Glob SerPl Elph-Mcnc 0.8 0.4 - 1.0 g/dL   B-Globulin SerPl Elph-Mcnc 0.9 0.7 - 1.3 g/dL   Gamma Glob SerPl Elph-Mcnc 1.6 0.4 - 1.8 g/dL   M Protein SerPl Elph-Mcnc 1.0 (H) Not Observed g/dL   Globulin, Total 3.6 2.2 - 3.9 g/dL   Albumin/Glob SerPl 1.0 0.7 - 1.7  IFE 1 Comment    Please Note Comment   CBC with Differential  Result Value Ref Range   WBC 6.7 3.6 - 11.0 K/uL   RBC 3.22 (L) 3.80 - 5.20 MIL/uL   Hemoglobin 10.6 (L) 12.0 - 16.0 g/dL   HCT 31.3 (L) 35.0 - 47.0 %   MCV 97.0 80.0 - 100.0 fL   MCH 33.0 26.0 - 34.0 pg   MCHC 34.0 32.0 - 36.0 g/dL   RDW 15.4 (H) 11.5 - 14.5 %   Platelets 215 150 - 440 K/uL   Neutrophils Relative % 66 %   Neutro Abs 4.5 1.4 - 6.5 K/uL   Lymphocytes Relative 23 %   Lymphs Abs 1.5 1.0 - 3.6 K/uL   Monocytes Relative 7 %   Monocytes Absolute 0.4 0.2 - 0.9 K/uL   Eosinophils Relative 3 %   Eosinophils Absolute 0.2 0 - 0.7 K/uL   Basophils Relative 1 %   Basophils Absolute 0.0 0 - 0.1 K/uL  Comprehensive metabolic panel  Result Value Ref Range   Sodium 140 135 - 145 mmol/L   Potassium  3.7 3.5 - 5.1 mmol/L   Chloride 107 101 - 111 mmol/L   CO2 24 22 - 32 mmol/L   Glucose, Bld 90 65 - 99 mg/dL   BUN 21 (H) 6 - 20 mg/dL   Creatinine, Ser 0.93 0.44 - 1.00 mg/dL   Calcium 9.3 8.9 - 10.3 mg/dL   Total Protein 7.9 6.5 - 8.1 g/dL   Albumin 4.2 3.5 - 5.0 g/dL   AST 18 15 - 41 U/L   ALT 13 (L) 14 - 54 U/L   Alkaline Phosphatase 78 38 - 126 U/L   Total Bilirubin 0.8 0.3 - 1.2 mg/dL   GFR calc non Af Amer >60 >60 mL/min   GFR calc Af Amer >60 >60 mL/min   Anion gap 9 5 - 15      Assessment & Plan:   Problem List Items Addressed This Visit      Unprioritized   Dysthymia    Note higher PHQ 9 score.  Pt wishes no treatment and plans to make some lifestyle changes to help      Hypertension    Stable, continue present medications.        Malignant neoplasm of overlapping sites of right breast in female, estrogen receptor negative ([place])    Regular visits with Dr. Fleet Contras       Other Visit Diagnoses    Need for Tdap vaccination    -  Primary   Relevant Orders   Tdap vaccine greater than or equal to 7yo IM (Completed)   Annual physical exam       Relevant Orders   CBC with Differential/Platelet   Comprehensive metabolic panel   Lipid Panel w/o Chol/HDL Ratio   TSH   Cytology - PAP       Follow up plan: Return in about 6 months (around 03/17/2018).

## 2017-09-14 NOTE — Assessment & Plan Note (Signed)
Stable, continue present medications.   

## 2017-09-15 LAB — CBC WITH DIFFERENTIAL/PLATELET
BASOS ABS: 0 10*3/uL (ref 0.0–0.2)
Basos: 0 %
EOS (ABSOLUTE): 0.1 10*3/uL (ref 0.0–0.4)
Eos: 2 %
HEMOGLOBIN: 11.6 g/dL (ref 11.1–15.9)
Hematocrit: 36.4 % (ref 34.0–46.6)
Immature Grans (Abs): 0 10*3/uL (ref 0.0–0.1)
Immature Granulocytes: 0 %
LYMPHS ABS: 1.9 10*3/uL (ref 0.7–3.1)
Lymphs: 32 %
MCH: 31.5 pg (ref 26.6–33.0)
MCHC: 31.9 g/dL (ref 31.5–35.7)
MCV: 99 fL — ABNORMAL HIGH (ref 79–97)
MONOCYTES: 7 %
Monocytes Absolute: 0.4 10*3/uL (ref 0.1–0.9)
NEUTROS ABS: 3.4 10*3/uL (ref 1.4–7.0)
Neutrophils: 59 %
PLATELETS: 223 10*3/uL (ref 150–450)
RBC: 3.68 x10E6/uL — ABNORMAL LOW (ref 3.77–5.28)
RDW: 14.2 % (ref 12.3–15.4)
WBC: 5.8 10*3/uL (ref 3.4–10.8)

## 2017-09-15 LAB — LIPID PANEL W/O CHOL/HDL RATIO
CHOLESTEROL TOTAL: 219 mg/dL — AB (ref 100–199)
HDL: 67 mg/dL (ref >39)
LDL CALC: 139 mg/dL — AB (ref 0–99)
Triglycerides: 66 mg/dL (ref 0–149)
VLDL Cholesterol Cal: 13 mg/dL (ref 5–40)

## 2017-09-15 LAB — COMPREHENSIVE METABOLIC PANEL
A/G RATIO: 1.2 (ref 1.2–2.2)
ALBUMIN: 4.2 g/dL (ref 3.5–5.5)
ALK PHOS: 92 IU/L (ref 39–117)
ALT: 11 IU/L (ref 0–32)
AST: 14 IU/L (ref 0–40)
BILIRUBIN TOTAL: 0.5 mg/dL (ref 0.0–1.2)
BUN / CREAT RATIO: 14 (ref 9–23)
BUN: 14 mg/dL (ref 6–24)
CHLORIDE: 103 mmol/L (ref 96–106)
CO2: 22 mmol/L (ref 20–29)
CREATININE: 1 mg/dL (ref 0.57–1.00)
Calcium: 9.6 mg/dL (ref 8.7–10.2)
GFR calc Af Amer: 71 mL/min/{1.73_m2} (ref >59)
GFR calc non Af Amer: 62 mL/min/{1.73_m2} (ref >59)
GLUCOSE: 89 mg/dL (ref 65–99)
Globulin, Total: 3.6 g/dL (ref 1.5–4.5)
Potassium: 4.3 mmol/L (ref 3.5–5.2)
Sodium: 139 mmol/L (ref 134–144)
Total Protein: 7.8 g/dL (ref 6.0–8.5)

## 2017-09-15 LAB — TSH: TSH: 1.95 u[IU]/mL (ref 0.450–4.500)

## 2017-09-15 NOTE — Progress Notes (Signed)
These are good results.  Your cholesterol is a little high.  Your LDL is above normal.  The LDL is the "lousy" or bad cholesterol.  Over time and in combination with inflammation and other factors, this contributes to plaque which in turn may lead to stroke and/or heart attack down the road.  Sometimes high LDL is primarily genetic, and people might be eating all the right foods but still have high numbers.  Other times, there is room for improvement in one's diet and eating healthier can bring this number down and potentially reduce one's risk of heart attack and/or stroke.   To reduce your LDL, Remember - more fruits and vegetables, more fish, and limitnred meat and dairy products.  More soy, nuts, beans, barley, lentils, oats and plant sterol ester enriched margarine instead of butter.  I also encourage eliminating sugar and processed food.  Remember, shop on the outside of the grocery store and visit your [employer].   If you would like to talk with me about dietary changes plus or minus medications for your cholesterol, please let me know. We should recheck your cholesterol in 3-6 months.

## 2017-09-17 LAB — CYTOLOGY - PAP
DIAGNOSIS: NEGATIVE
HPV: NOT DETECTED

## 2017-09-28 ENCOUNTER — Other Ambulatory Visit: Payer: Self-pay | Admitting: Oncology

## 2017-09-28 ENCOUNTER — Encounter: Payer: Self-pay | Admitting: Oncology

## 2017-09-28 MED ORDER — PREGABALIN 100 MG PO CAPS: ORAL_CAPSULE | ORAL | 1 refills | Status: DC

## 2017-09-30 ENCOUNTER — Encounter: Payer: Self-pay | Admitting: Oncology

## 2017-10-16 ENCOUNTER — Other Ambulatory Visit: Payer: Self-pay | Admitting: Unknown Physician Specialty

## 2017-10-16 DIAGNOSIS — I10 Essential (primary) hypertension: Secondary | ICD-10-CM

## 2017-11-02 ENCOUNTER — Ambulatory Visit
Admission: RE | Admit: 2017-11-02 | Discharge: 2017-11-02 | Disposition: A | Discharge status: Home / Self Care | Payer: Managed Care, Other (non HMO) | Source: Ambulatory Visit | Attending: Radiation Oncology | Admitting: Radiation Oncology

## 2017-11-02 ENCOUNTER — Other Ambulatory Visit: Payer: Self-pay

## 2017-11-02 ENCOUNTER — Encounter: Payer: Self-pay | Admitting: Radiation Oncology

## 2017-11-02 VITALS — BP 167/81 | HR 78 | Temp 97.7°F | Resp 16 | Wt 202.2 lb

## 2017-11-02 DIAGNOSIS — Z171 Estrogen receptor negative status [ER-]: Secondary | ICD-10-CM

## 2017-11-02 DIAGNOSIS — Z923 Personal history of irradiation: Secondary | ICD-10-CM | POA: Diagnosis not present

## 2017-11-02 DIAGNOSIS — Z853 Personal history of malignant neoplasm of breast: Secondary | ICD-10-CM | POA: Diagnosis not present

## 2017-11-02 DIAGNOSIS — C50411 Malignant neoplasm of upper-outer quadrant of right female breast: Secondary | ICD-10-CM

## 2017-11-02 NOTE — Progress Notes (Signed)
Radiation Oncology Follow up Note  Name: Adrienne [name]   Date:   11/02/2017 MRN:  [id number] DOB: 05-11-1960    This 57 y.o. female presents to the clinic today for 3 year follow-up status post accelerated partial breast irradiation to herbilateral breasts for invasive mammary carcinoma  REFERRING PROVIDER: Kathrine Haddock, NP  HPI: patient is a 57 year old female now out 3 years having completed accelerated partial breast radiation to her right breast 3 years prior and 6 years since completion to her left breast. 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 Her tumors were ER/PR negative she is not on antiestrogen therapy. She is doing well. She specifically denies breast tenderness cough or bone pain..mammograms back in April were BI-RADS 2 benign.  COMPLICATIONS OF TREATMENT: none  FOLLOW UP COMPLIANCE: keeps appointments   PHYSICAL EXAM:  BP (!) 167/81 (BP Location: Left Arm, Patient Position: Sitting)   Pulse 78   Temp 97.7 F (36.5 C) (Tympanic)   Resp 16   Wt 202 lb 2.6 oz (91.7 kg)   LMP  (LMP Unknown)   BMI 34.06 kg/m  Both breasts have scarred lumpectomy cavity is unchanged from prior exam no other dominant mass or nodularity is noted in either breast in 2 positions examined. No axillary or supraclavicular adenopathy is appreciated.Well-developed well-nourished patient in NAD. HEENT reveals PERLA, EOMI, discs not visualized.  Oral cavity is clear. No oral mucosal lesions are identified. Neck is clear without evidence of cervical or supraclavicular adenopathy. Lungs are clear to A&P. Cardiac examination is essentially unremarkable with regular rate and rhythm without murmur rub or thrill. Abdomen is benign with no organomegaly or masses noted. Motor sensory and DTR levels are equal and symmetric in the upper and lower extremities. Cranial nerves II through XII are grossly intact. Proprioception is intact. No peripheral adenopathy or edema is identified. No  motor or sensory levels are noted. Crude visual fields are within normal range.  RADIOLOGY RESULTS: mammograms reviewed and compatible with the above-stated findings  PLAN: present time patient continues to do well with no evidence of disease. She continues on tamoxifen without side effect. She is a rescheduled for follow-up mammograms next year. I'm please were overall progress. We'll see her back in 1 year for follow-up. Patient is to call sooner with any concerns.  I would like to take this opportunity to thank you for allowing me to participate in the care of your patient.Noreene Filbert, MD

## 2018-01-12 ENCOUNTER — Other Ambulatory Visit: Payer: Self-pay | Admitting: Unknown Physician Specialty

## 2018-01-12 DIAGNOSIS — I10 Essential (primary) hypertension: Secondary | ICD-10-CM

## 2018-01-12 NOTE — Telephone Encounter (Signed)
Requested Prescriptions  Pending Prescriptions Disp Refills  . lisinopril (PRINIVIL,ZESTRIL) 10 MG tablet [Pharmacy Med Name: LISINOPRIL  10MG   TAB] 90 tablet 0    Sig: TAKE 1 TABLET BY MOUTH  DAILY     Cardiovascular:  ACE Inhibitors Failed - 01/12/2018  6:45 AM      Failed - Last BP in normal range    BP Readings from Last 1 Encounters:  11/02/17 (!) 167/81         Passed - Cr in normal range and within 180 days    Creatinine  Date Value Ref Range Status  07/16/2011 0.95 0.60 - 1.30 mg/dL Final   Creatinine, Ser  Date Value Ref Range Status  09/14/2017 1.00 0.57 - 1.00 mg/dL Final         Passed - K in normal range and within 180 days    Potassium  Date Value Ref Range Status  09/14/2017 4.3 3.5 - 5.2 mmol/L Final  07/16/2011 3.7 3.5 - 5.1 mmol/L Final         Passed - Patient is not pregnant      Passed - Valid encounter within last 6 months    Recent Outpatient Visits          4 months ago Need for Tdap vaccination   [facility] Kathrine Haddock, NP   10 months ago Need for influenza vaccination   [facility] Kathrine Haddock, NP   1 year ago Annual physical exam   [facility] Kathrine Haddock, NP   1 year ago Annual physical exam   [facility] Kathrine Haddock, NP   2 years ago Health care maintenance   [facility] Kathrine Haddock, NP

## 2018-02-23 ENCOUNTER — Other Ambulatory Visit: Payer: Self-pay | Admitting: *Deleted

## 2018-02-23 ENCOUNTER — Inpatient Hospital Stay: Payer: Managed Care, Other (non HMO) | Attending: Internal Medicine

## 2018-02-23 DIAGNOSIS — D472 Monoclonal gammopathy: Secondary | ICD-10-CM

## 2018-02-23 DIAGNOSIS — D649 Anemia, unspecified: Secondary | ICD-10-CM | POA: Diagnosis not present

## 2018-02-23 DIAGNOSIS — Z171 Estrogen receptor negative status [ER-]: Secondary | ICD-10-CM

## 2018-02-23 DIAGNOSIS — C50811 Malignant neoplasm of overlapping sites of right female breast: Secondary | ICD-10-CM | POA: Diagnosis present

## 2018-02-23 DIAGNOSIS — I10 Essential (primary) hypertension: Secondary | ICD-10-CM | POA: Insufficient documentation

## 2018-02-23 DIAGNOSIS — Z9221 Personal history of antineoplastic chemotherapy: Secondary | ICD-10-CM | POA: Diagnosis not present

## 2018-02-23 DIAGNOSIS — Z7982 Long term (current) use of aspirin: Secondary | ICD-10-CM | POA: Diagnosis not present

## 2018-02-23 DIAGNOSIS — Z79899 Other long term (current) drug therapy: Secondary | ICD-10-CM | POA: Insufficient documentation

## 2018-02-23 DIAGNOSIS — Z923 Personal history of irradiation: Secondary | ICD-10-CM | POA: Diagnosis not present

## 2018-02-23 LAB — CBC WITH DIFFERENTIAL/PLATELET
Abs Immature Granulocytes: 0.02 10*3/uL (ref 0.00–0.07)
BASOS ABS: 0 10*3/uL (ref 0.0–0.1)
BASOS PCT: 1 %
EOS ABS: 0.2 10*3/uL (ref 0.0–0.5)
EOS PCT: 3 %
HCT: 31.9 % — ABNORMAL LOW (ref 36.0–46.0)
Hemoglobin: 10.5 g/dL — ABNORMAL LOW (ref 12.0–15.0)
Immature Granulocytes: 0 %
Lymphocytes Relative: 32 %
Lymphs Abs: 1.9 10*3/uL (ref 0.7–4.0)
MCH: 31.5 pg (ref 26.0–34.0)
MCHC: 32.9 g/dL (ref 30.0–36.0)
MCV: 95.8 fL (ref 80.0–100.0)
MONO ABS: 0.4 10*3/uL (ref 0.1–1.0)
Monocytes Relative: 7 %
NRBC: 0 % (ref 0.0–0.2)
Neutro Abs: 3.4 10*3/uL (ref 1.7–7.7)
Neutrophils Relative %: 57 %
Platelets: 192 10*3/uL (ref 150–400)
RBC: 3.33 MIL/uL — AB (ref 3.87–5.11)
RDW: 13.7 % (ref 11.5–15.5)
WBC: 5.9 10*3/uL (ref 4.0–10.5)

## 2018-02-23 LAB — COMPREHENSIVE METABOLIC PANEL
ALT: 16 U/L (ref 0–44)
AST: 19 U/L (ref 15–41)
Albumin: 3.7 g/dL (ref 3.5–5.0)
Alkaline Phosphatase: 73 U/L (ref 38–126)
Anion gap: 6 (ref 5–15)
BILIRUBIN TOTAL: 0.6 mg/dL (ref 0.3–1.2)
BUN: 21 mg/dL — AB (ref 6–20)
CALCIUM: 9.3 mg/dL (ref 8.9–10.3)
CO2: 25 mmol/L (ref 22–32)
CREATININE: 0.93 mg/dL (ref 0.44–1.00)
Chloride: 109 mmol/L (ref 98–111)
Glucose, Bld: 106 mg/dL — ABNORMAL HIGH (ref 70–99)
Potassium: 3.7 mmol/L (ref 3.5–5.1)
Sodium: 140 mmol/L (ref 135–145)
TOTAL PROTEIN: 7.5 g/dL (ref 6.5–8.1)

## 2018-02-24 LAB — CANCER ANTIGEN 27.29: CAN 27.29: 13 U/mL (ref 0.0–38.6)

## 2018-02-24 LAB — KAPPA/LAMBDA LIGHT CHAINS
KAPPA FREE LGHT CHN: 20.2 mg/L — AB (ref 3.3–19.4)
Kappa, lambda light chain ratio: 1.7 — ABNORMAL HIGH (ref 0.26–1.65)
Lambda free light chains: 11.9 mg/L (ref 5.7–26.3)

## 2018-02-25 ENCOUNTER — Encounter: Payer: Self-pay | Admitting: Internal Medicine

## 2018-02-25 ENCOUNTER — Inpatient Hospital Stay: Payer: Managed Care, Other (non HMO) | Admitting: Internal Medicine

## 2018-02-25 DIAGNOSIS — D649 Anemia, unspecified: Secondary | ICD-10-CM | POA: Diagnosis not present

## 2018-02-25 DIAGNOSIS — Z171 Estrogen receptor negative status [ER-]: Secondary | ICD-10-CM | POA: Diagnosis not present

## 2018-02-25 DIAGNOSIS — Z9221 Personal history of antineoplastic chemotherapy: Secondary | ICD-10-CM

## 2018-02-25 DIAGNOSIS — Z923 Personal history of irradiation: Secondary | ICD-10-CM

## 2018-02-25 DIAGNOSIS — Z7982 Long term (current) use of aspirin: Secondary | ICD-10-CM

## 2018-02-25 DIAGNOSIS — D472 Monoclonal gammopathy: Secondary | ICD-10-CM | POA: Diagnosis not present

## 2018-02-25 DIAGNOSIS — C50811 Malignant neoplasm of overlapping sites of right female breast: Secondary | ICD-10-CM

## 2018-02-25 DIAGNOSIS — Z79899 Other long term (current) drug therapy: Secondary | ICD-10-CM

## 2018-02-25 DIAGNOSIS — I10 Essential (primary) hypertension: Secondary | ICD-10-CM

## 2018-02-25 NOTE — Addendum Note (Signed)
Addended by: Sandria Bales B on: 02/25/2018 09:23 AM   Modules accepted: Orders

## 2018-02-25 NOTE — Assessment & Plan Note (Addendum)
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  Clinically noticeable recurrence.  Stable.  May 2019 mammogram normal.  Continue surveillance.  # Peripheral neuropathy- sec to chemo. grade 1-2 STABLE.   # Chronic intermittent dizziness/vertigo- STABLE.   # chronic mild anemia- Hb 10-STABLE.   #Genetic counseling-patient is BRCA1 and 2- negative.  However, given history of bilateral breast cancer/ triple negative breast cancer-recommend genetic counseling.  Patient agrees.  # MGUS- 1gm/dl; stable.  Again reviewed regarding natural history of MGUS.  monitor for now.   # DISPOSITION: # Garment/textile technologist- Dx; Breast cancer # follow up in 6 months/labs- cbc/cmp/ca-27-29/MM panel;K-L panel-Dr.B

## 2018-02-25 NOTE — Progress Notes (Signed)
[place] OFFICE PROGRESS NOTE  Patient Care Team: Kathrine Haddock, NP as PCP - General (Nurse Practitioner) Bary Castilla, Forest Gleason, MD (General Surgery) Cammie Sickle, MD as Consulting Physician (Internal Medicine)   SUMMARY OF ONCOLOGIC HISTORY:  Oncology History   # MAY 2016- RIGHT BREAST CANCER- Triple negative [T=0.5cm;G-3; margins-Neg]; Mammaprint- 0.814/molecular basal type [5 year risk- 22%; 10 year risk-29%] AC q3 W- taxol q w x 8/ 12 [sec to PN; finished Feb 2017].   # 2012 LEFT BREAST CA STAGE I s/p Lumpec [T1 (0.7CM);Triple Neg; G-3]; s/p mammosite RT; No adj chemo  #  Mild Anemia/M-protein 0.8gm/dl [sep 2016]  # BRCA- 1&2 NEGATIVE [july 2016]  # PN s/p acupuncture  --------------------------------------------------     DIAGNOSIS: breast cancer  STAGE:   I      ;GOALS: cure  CURRENT/MOST RECENT THERAPY: surveillaince       Malignant neoplasm of overlapping sites of right breast in female, estrogen receptor negative ([place])    INTERVAL HISTORY:  A pleasant 57 year old female patient with above history of stage I right-sided triple negative breast cancer - Is here for follow-up.  Patient overall has been doing well.  Denies any headaches but denies any bone pain.  Denies any nausea vomiting or new lumps or bumps.  She continues to have intermittent tingling and numbness in extremities.  She fell few weeks ago.  No head trauma.  Review of Systems  Constitutional: Negative for chills, diaphoresis, fever, malaise/fatigue and weight loss.  HENT: Negative for nosebleeds and sore throat.   Eyes: Negative for double vision.  Respiratory: Negative for cough, hemoptysis, sputum production, shortness of breath and wheezing.   Cardiovascular: Negative for chest pain, palpitations, orthopnea and leg swelling.  Gastrointestinal: Negative for abdominal pain, blood in stool, constipation, diarrhea, heartburn, melena, nausea and vomiting.  Genitourinary:  Negative for dysuria, frequency and urgency.  Musculoskeletal: Negative for back pain and joint pain.  Skin: Negative.  Negative for itching and rash.  Neurological: Negative for dizziness, tingling, focal weakness, weakness and headaches.  Endo/Heme/Allergies: Does not bruise/bleed easily.  Psychiatric/Behavioral: Negative for depression. The patient is not nervous/anxious and does not have insomnia.      PAST MEDICAL HISTORY :  Past Medical History:  Diagnosis Date  . Anemia   . Asthma   . BRCA negative 09/22/2014  . Breast cancer ([place]) 2012   LT LUMPECTOMY  . Breast cancer ([place]) 2016   RT LUMPECTOMY  . Breast cyst 2010   cyst- drained  . Breast screening, unspecified   . Constipation   . Dry eyes 2012  . GERD (gastroesophageal reflux disease)   . Hematuria   . Kidney stone   . Lump or mass in breast   . Malignant neoplasm of breast (female), unspecified site 07/08/2014   Right breast, 5 mm, T1a,N0, triple negative; high risk for recurrence on Mammoprint.  . Malignant neoplasm of upper-outer quadrant of female breast ([place]) 05/08/2010   Left breast: T1b, N0, M); triple negative, DCIS present. No chemotherapy. MammoSite radiation.  . Neuropathy    pt states in fingertips and feet  . Obesity, unspecified   . Pancreatic lesion   . Personal history of chemotherapy 2016   BREAST CA  . 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 Personal history of  radiation therapy 2012   BREAST CA - MAMMOSITE  . Personal history of radiation therapy 2016   BREAST CA - MAMMOSITE  . Screening for obesity   . Seasonal allergies   . Special screening for malignant neoplasms, colon   . Unspecified essential hypertension     PAST SURGICAL HISTORY :   Past Surgical History:  Procedure Laterality Date  .  BREAST BIOPSY    . BREAST CYST ASPIRATION Left 2010  . BREAST LUMPECTOMY Left 2012   BREAST CA  . BREAST LUMPECTOMY Right 2016   BREAST CA  . BREAST LUMPECTOMY WITH AXILLARY LYMPH NODE BIOPSY Right 08/01/2014   Procedure: BREAST LUMPECTOMY WITH AXILLARY LYMPH NODE BIOPSY;  Surgeon: Robert Bellow, MD;  Location: [facility] ORS;  Service: General;  Laterality: Right;  . BREAST MAMMOSITE  April 2012   mammosite placement and removal   . BREAST SURGERY Left 2012   left breast wide excision, sentinel node, MammoSite  . BREAST SURGERY Right 08/06/2014   Wide excision/sentinel node biopsy, T1a, N0 triple negative, MammoSite  . CESAREAN SECTION  1988  . COLONOSCOPY  October 17, 2009   Dr. Dionne Milo, normal study.  Marland Kitchen KIDNEY STONE SURGERY  2003   stones removed by Dr. Quillian Quince- stent placed/removed  . PORTACATH PLACEMENT Left 11/09/2014   Procedure: INSERTION PORT-A-CATH;  Surgeon: Robert Bellow, MD;  Location: [facility] ORS;  Service: General;  Laterality: Left;  . SENTINEL NODE BIOPSY Right 08/01/2014   Procedure: SENTINEL NODE BIOPSY;  Surgeon: Robert Bellow, MD;  Location: [facility] ORS;  Service: General;  Laterality: Right;  . TUBAL LIGATION      FAMILY HISTORY :   Family History  Problem Relation Age of Onset  . Ovarian cancer Mother   . Diabetes Mother   . Cancer Father        prostate  . Diabetes Father   . Hypertension Brother   . Cancer Maternal Grandmother        bone  . Cancer Maternal Grandfather        lung  . Cancer Paternal Grandmother        lung  . Asthma Paternal Grandfather   . Hypertension Sister   . Breast cancer Maternal Aunt 73    SOCIAL HISTORY:   Social History   Tobacco Use  . Smoking status: Never Smoker  . Smokeless tobacco: Never Used  Substance Use Topics  . Alcohol use: Yes    Comment: occasionally  . Drug use: No    ALLERGIES:  is allergic to cayenne pepper [cayenne]; shellfish allergy; and tape.  MEDICATIONS:  Current Outpatient Medications   Medication Sig Dispense Refill  . albuterol (PROVENTIL HFA;VENTOLIN HFA) 108 (90 Base) MCG/ACT inhaler Inhale 2 puffs into the lungs every 6 (six) hours as needed for wheezing or shortness of breath. 1 Inhaler 3  . aspirin 81 MG tablet Take 81 mg by mouth daily.    . bisacodyl (DULCOLAX) 5 MG EC tablet Take 5 mg by mouth daily as needed for moderate constipation.    . Cholecalciferol (VITAMIN D3) 1000 UNITS CAPS Take 1 capsule by mouth daily.     Marland Kitchen esomeprazole (NEXIUM) 40 MG capsule Take 40 mg by mouth 2 (two) times daily as needed (acid reflux).     Marland Kitchen lisinopril (PRINIVIL,ZESTRIL) 10 MG tablet TAKE 1 TABLET BY MOUTH  DAILY 90 tablet 0  . loratadine (CLARITIN) 10 MG tablet Take 10 mg by mouth daily. Reported on 04/13/2015    .  meclizine (ANTIVERT) 12.5 MG tablet Take 1 tablet (12.5 mg total) by mouth 3 (three) times daily as needed for dizziness. 40 tablet 0  . polyethylene glycol (MIRALAX / GLYCOLAX) packet Take 17 g by mouth every other day.    . pregabalin (LYRICA) 100 MG capsule 1 tablet daily before bedtime 30 capsule 1   No current facility-administered medications for this visit.    Facility-Administered Medications Ordered in Other Visits  Medication Dose Route Frequency Provider Last Rate Last Dose  . sodium chloride 0.9 % injection 10 mL  10 mL Intravenous PRN Leia Alf, MD   10 mL at 11/10/14 1138    PHYSICAL EXAMINATION: ECOG PERFORMANCE STATUS: 1 - Symptomatic but completely ambulatory  BP (!) 144/86 (BP Location: Left Arm, Patient Position: Sitting, Cuff Size: Normal)   Pulse 87   Temp (!) 97.4 F (36.3 C) (Tympanic)   Resp 16   Wt 206 lb 12.8 oz (93.8 kg)   LMP  (LMP Unknown)   BMI 34.84 kg/m   Filed Weights   02/25/18 0858  Weight: 206 lb 12.8 oz (93.8 kg)    Physical Exam  Constitutional: She is oriented to person, place, and time and well-developed, well-nourished, and in no distress.  HENT:  Head: Normocephalic and atraumatic.  Mouth/Throat:  Oropharynx is clear and moist. No oropharyngeal exudate.  Eyes: Pupils are equal, round, and reactive to light.  Neck: Normal range of motion. Neck supple.  Cardiovascular: Normal rate and regular rhythm.  Pulmonary/Chest: No respiratory distress. She has no wheezes.  Abdominal: Soft. Bowel sounds are normal. She exhibits no distension and no mass. There is no abdominal tenderness. There is no rebound and no guarding.  Musculoskeletal: Normal range of motion.        General: No tenderness or edema.  Neurological: She is alert and oriented to person, place, and time.  Skin: Skin is warm.  Psychiatric: Affect normal.     LABORATORY DATA:  I have reviewed the data as listed    Component Value Date/Time   NA 140 02/23/2018 0842   NA 139 09/14/2017 1048   NA 140 07/16/2011 2130   K 3.7 02/23/2018 0842   K 3.7 07/16/2011 2130   CL 109 02/23/2018 0842   CL 106 07/16/2011 2130   CO2 25 02/23/2018 0842   CO2 27 07/16/2011 2130   GLUCOSE 106 (H) 02/23/2018 0842   GLUCOSE 93 07/16/2011 2130   BUN 21 (H) 02/23/2018 0842   BUN 14 09/14/2017 1048   BUN 16 07/16/2011 2130   CREATININE 0.93 02/23/2018 0842   CREATININE 0.95 07/16/2011 2130   CALCIUM 9.3 02/23/2018 0842   CALCIUM 9.0 07/16/2011 2130   PROT 7.5 02/23/2018 0842   PROT 7.8 09/14/2017 1048   ALBUMIN 3.7 02/23/2018 0842   ALBUMIN 4.2 09/14/2017 1048   AST 19 02/23/2018 0842   ALT 16 02/23/2018 0842   ALKPHOS 73 02/23/2018 0842   BILITOT 0.6 02/23/2018 0842   BILITOT 0.5 09/14/2017 1048   GFRNONAA >60 02/23/2018 0842   GFRNONAA >60 07/16/2011 2130   GFRAA >60 02/23/2018 0842   GFRAA >60 07/16/2011 2130    No results found for: SPEP, UPEP  Lab Results  Component Value Date   WBC 5.9 02/23/2018   NEUTROABS 3.4 02/23/2018   HGB 10.5 (L) 02/23/2018   HCT 31.9 (L) 02/23/2018   MCV 95.8 02/23/2018   PLT 192 02/23/2018      Chemistry      Component Value Date/Time  NA 140 02/23/2018 0842   NA 139 09/14/2017 1048    NA 140 07/16/2011 2130   K 3.7 02/23/2018 0842   K 3.7 07/16/2011 2130   CL 109 02/23/2018 0842   CL 106 07/16/2011 2130   CO2 25 02/23/2018 0842   CO2 27 07/16/2011 2130   BUN 21 (H) 02/23/2018 0842   BUN 14 09/14/2017 1048   BUN 16 07/16/2011 2130   CREATININE 0.93 02/23/2018 0842   CREATININE 0.95 07/16/2011 2130      Component Value Date/Time   CALCIUM 9.3 02/23/2018 0842   CALCIUM 9.0 07/16/2011 2130   ALKPHOS 73 02/23/2018 0842   AST 19 02/23/2018 0842   ALT 16 02/23/2018 0842   BILITOT 0.6 02/23/2018 0842   BILITOT 0.5 09/14/2017 1048         ASSESSMENT & PLAN:   Malignant neoplasm of overlapping sites of right breast in female, estrogen receptor negative ([place]) # 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  Clinically noticeable recurrence.  Stable.  May 2019 mammogram normal.  Continue surveillance.  # Peripheral neuropathy- sec to chemo. grade 1-2 STABLE.   # Chronic intermittent dizziness/vertigo- STABLE.   # chronic mild anemia- Hb 10-STABLE.   #Genetic counseling-patient is BRCA1 and 2- negative.  However, given history of bilateral breast cancer/ triple negative breast cancer-recommend genetic counseling.  Patient agrees.  # MGUS- 1gm/dl; stable.  Again reviewed regarding natural history of MGUS.  monitor for now.   # DISPOSITION: # Garment/textile technologist- Dx; Breast cancer # follow up in 6 months/labs- cbc/cmp/ca-27-29/MM panel;K-L panel-Dr.B           Cammie Sickle, MD 02/25/2018 9:16 AM

## 2018-02-25 NOTE — Addendum Note (Signed)
Addended by: Sandria Bales B on: 02/25/2018 09:20 AM   Modules accepted: Orders

## 2018-03-02 LAB — MULTIPLE MYELOMA PANEL, SERUM
ALBUMIN/GLOB SERPL: 1.1 (ref 0.7–1.7)
ALPHA 1: 0.2 g/dL (ref 0.0–0.4)
ALPHA2 GLOB SERPL ELPH-MCNC: 0.9 g/dL (ref 0.4–1.0)
Albumin SerPl Elph-Mcnc: 3.6 g/dL (ref 2.9–4.4)
B-Globulin SerPl Elph-Mcnc: 0.8 g/dL (ref 0.7–1.3)
Gamma Glob SerPl Elph-Mcnc: 1.4 g/dL (ref 0.4–1.8)
Globulin, Total: 3.3 g/dL (ref 2.2–3.9)
IGG (IMMUNOGLOBIN G), SERUM: 1734 mg/dL — AB (ref 700–1600)
IGM (IMMUNOGLOBULIN M), SRM: 45 mg/dL (ref 26–217)
IgA: 140 mg/dL (ref 87–352)
M Protein SerPl Elph-Mcnc: 0.9 g/dL — ABNORMAL HIGH
Total Protein ELP: 6.9 g/dL (ref 6.0–8.5)

## 2018-05-10 ENCOUNTER — Other Ambulatory Visit: Payer: Self-pay

## 2018-05-10 DIAGNOSIS — Z171 Estrogen receptor negative status [ER-]: Secondary | ICD-10-CM

## 2018-05-10 DIAGNOSIS — C50811 Malignant neoplasm of overlapping sites of right female breast: Secondary | ICD-10-CM

## 2018-05-20 ENCOUNTER — Other Ambulatory Visit: Payer: Self-pay | Admitting: Unknown Physician Specialty

## 2018-05-20 DIAGNOSIS — I10 Essential (primary) hypertension: Secondary | ICD-10-CM

## 2018-05-20 NOTE — Telephone Encounter (Signed)
Requested Prescriptions  Pending Prescriptions Disp Refills  . lisinopril (PRINIVIL,ZESTRIL) 10 MG tablet [Pharmacy Med Name: LISINOPRIL  10MG   TAB] 30 tablet 0    Sig: TAKE 1 TABLET BY MOUTH  DAILY     Cardiovascular:  ACE Inhibitors Failed - 05/20/2018  5:23 AM      Failed - Last BP in normal range    BP Readings from Last 1 Encounters:  02/25/18 (!) 144/86         Failed - Valid encounter within last 6 months    Recent Outpatient Visits          8 months ago Need for Tdap vaccination   [facility] Kathrine Haddock, NP   1 year ago Need for influenza vaccination   [facility] Kathrine Haddock, NP   1 year ago Annual physical exam   [facility] Kathrine Haddock, NP   2 years ago Annual physical exam   [facility] Kathrine Haddock, NP   2 years ago Health care maintenance   [facility] Kathrine Haddock, NP             Passed - Cr in normal range and within 180 days    Creatinine  Date Value Ref Range Status  07/16/2011 0.95 0.60 - 1.30 mg/dL Final   Creatinine, Ser  Date Value Ref Range Status  02/23/2018 0.93 0.44 - 1.00 mg/dL Final         Passed - K in normal range and within 180 days    Potassium  Date Value Ref Range Status  02/23/2018 3.7 3.5 - 5.1 mmol/L Final  07/16/2011 3.7 3.5 - 5.1 mmol/L Final         Passed - Patient is not pregnant    called pt.; left vm to have pt. Call office to schedule f/u appt. (last appt. 09/14/17

## 2018-05-24 ENCOUNTER — Ambulatory Visit: Payer: Managed Care, Other (non HMO) | Admitting: Nurse Practitioner

## 2018-05-24 ENCOUNTER — Encounter: Payer: Self-pay | Admitting: Nurse Practitioner

## 2018-05-24 ENCOUNTER — Other Ambulatory Visit: Payer: Self-pay

## 2018-05-24 VITALS — BP 137/83 | HR 74 | Temp 98.3°F | Ht 65.0 in | Wt 207.0 lb

## 2018-05-24 DIAGNOSIS — M79676 Pain in unspecified toe(s): Secondary | ICD-10-CM

## 2018-05-24 DIAGNOSIS — I10 Essential (primary) hypertension: Secondary | ICD-10-CM | POA: Diagnosis not present

## 2018-05-24 DIAGNOSIS — Z13220 Encounter for screening for lipoid disorders: Secondary | ICD-10-CM | POA: Diagnosis not present

## 2018-05-24 MED ORDER — LISINOPRIL 10 MG PO TABS: 10.0000 mg | ORAL_TABLET | Freq: Every day | ORAL | 2 refills | Status: DC

## 2018-05-24 NOTE — Progress Notes (Signed)
BP 137/83   Pulse 74   Temp 98.3 F (36.8 C) (Oral)   Ht 5' 5"  (1.651 m)   Wt 207 lb (93.9 kg)   LMP  (LMP Unknown)   SpO2 93%   BMI 34.45 kg/m    Subjective:    Patient ID: Adrienne [name], female    DOB: Dec 06, 1960, 58 y.o.   MRN: [id number]  HPI: Adrienne [name] is a 58 y.o. female  Chief Complaint  Patient presents with  . Hypertension    lisinopril refill   HYPERTENSION Hypertension status: stable  Satisfied with current treatment? yes Duration of hypertension: chronic BP monitoring frequency:  daily BP range: 120/70's BP medication side effects:  no Medication compliance: excellent compliance Previous BP meds: HCTZ Aspirin: yes Recurrent headaches: no Visual changes: no Palpitations: no Dyspnea: no Chest pain: no Lower extremity edema: no Dizzy/lightheaded: no   GREAT TOE PAIN: Has been on and off bilateral great toes for the past month.  Can not wear "nursing shoes" to work, as this aggravates it.  The pain in great toes fluctuates, on/off.  States they do get red at times, but no swelling or warmth.  Pain is a 8/10 when present, not present today.  Has family history of a brother with gout.  During episodes of this pain, taking a break and taking shoes off makes it better.  Having anything touching the toes during these incidents makes pain worse.  Discussed various foods to avoid and obtaining labs today.  She does have underlying neuropathy she takes Pregabalin for secondary to previous CA treatment.  Relevant past medical, surgical, family and social history reviewed and updated as indicated. Interim medical history since our last visit reviewed. Allergies and medications reviewed and updated.  Review of Systems  Constitutional: Negative for activity change, appetite change, diaphoresis, fatigue and fever.  Respiratory: Negative for cough, chest tightness and shortness of breath.   Cardiovascular: Negative for chest pain, palpitations and leg swelling.   Gastrointestinal: Negative for abdominal distention, abdominal pain, constipation, diarrhea, nausea and vomiting.  Endocrine: Negative for cold intolerance, heat intolerance, polydipsia, polyphagia and polyuria.  Neurological: Negative for dizziness, syncope, weakness, light-headedness, numbness and headaches.  Psychiatric/Behavioral: Negative.     Per HPI unless specifically indicated above     Objective:    BP 137/83   Pulse 74   Temp 98.3 F (36.8 C) (Oral)   Ht 5' 5"  (1.651 m)   Wt 207 lb (93.9 kg)   LMP  (LMP Unknown)   SpO2 93%   BMI 34.45 kg/m   Wt Readings from Last 3 Encounters:  05/24/18 207 lb (93.9 kg)  02/25/18 206 lb 12.8 oz (93.8 kg)  11/02/17 202 lb 2.6 oz (91.7 kg)    Physical Exam Vitals signs and nursing note reviewed.  Constitutional:      Appearance: She is well-developed.  HENT:     Head: Normocephalic.  Eyes:     General:        Right eye: No discharge.        Left eye: No discharge.     Conjunctiva/sclera: Conjunctivae normal.     Pupils: Pupils are equal, round, and reactive to light.  Neck:     Musculoskeletal: Normal range of motion and neck supple.     Thyroid: No thyromegaly.     Vascular: No carotid bruit or JVD.  Cardiovascular:     Rate and Rhythm: Normal rate and regular rhythm.     Heart  sounds: Normal heart sounds. No murmur. No gallop.   Pulmonary:     Effort: Pulmonary effort is normal.     Breath sounds: Normal breath sounds.  Abdominal:     General: Bowel sounds are normal.     Palpations: Abdomen is soft.  Musculoskeletal:     Right lower leg: Edema (trace) present.     Left lower leg: Edema (trace) present.  Feet:     Comments: Great toes bilateral with no warmth, normal color, and no tenderness or edema today. Lymphadenopathy:     Cervical: No cervical adenopathy.  Skin:    General: Skin is warm and dry.  Neurological:     Mental Status: She is alert and oriented to person, place, and time.  Psychiatric:         Mood and Affect: Mood normal.        Behavior: Behavior normal.        Thought Content: Thought content normal.        Judgment: Judgment normal.     Results for orders placed or performed in visit on 02/23/18  Comprehensive metabolic panel  Result Value Ref Range   Sodium 140 135 - 145 mmol/L   Potassium 3.7 3.5 - 5.1 mmol/L   Chloride 109 98 - 111 mmol/L   CO2 25 22 - 32 mmol/L   Glucose, Bld 106 (H) 70 - 99 mg/dL   BUN 21 (H) 6 - 20 mg/dL   Creatinine, Ser 0.93 0.44 - 1.00 mg/dL   Calcium 9.3 8.9 - 10.3 mg/dL   Total Protein 7.5 6.5 - 8.1 g/dL   Albumin 3.7 3.5 - 5.0 g/dL   AST 19 15 - 41 U/L   ALT 16 0 - 44 U/L   Alkaline Phosphatase 73 38 - 126 U/L   Total Bilirubin 0.6 0.3 - 1.2 mg/dL   GFR calc non Af Amer >60 >60 mL/min   GFR calc Af Amer >60 >60 mL/min   Anion gap 6 5 - 15  CBC with Differential  Result Value Ref Range   WBC 5.9 4.0 - 10.5 K/uL   RBC 3.33 (L) 3.87 - 5.11 MIL/uL   Hemoglobin 10.5 (L) 12.0 - 15.0 g/dL   HCT 31.9 (L) 36.0 - 46.0 %   MCV 95.8 80.0 - 100.0 fL   MCH 31.5 26.0 - 34.0 pg   MCHC 32.9 30.0 - 36.0 g/dL   RDW 13.7 11.5 - 15.5 %   Platelets 192 150 - 400 K/uL   nRBC 0.0 0.0 - 0.2 %   Neutrophils Relative % 57 %   Neutro Abs 3.4 1.7 - 7.7 K/uL   Lymphocytes Relative 32 %   Lymphs Abs 1.9 0.7 - 4.0 K/uL   Monocytes Relative 7 %   Monocytes Absolute 0.4 0.1 - 1.0 K/uL   Eosinophils Relative 3 %   Eosinophils Absolute 0.2 0.0 - 0.5 K/uL   Basophils Relative 1 %   Basophils Absolute 0.0 0.0 - 0.1 K/uL   Immature Granulocytes 0 %   Abs Immature Granulocytes 0.02 0.00 - 0.07 K/uL  Cancer antigen 27.29  Result Value Ref Range   CA 27.29 13.0 0.0 - 38.6 U/mL  Kappa/lambda light chains  Result Value Ref Range   Kappa free light chain 20.2 (H) 3.3 - 19.4 mg/L   Lamda free light chains 11.9 5.7 - 26.3 mg/L   Kappa, lamda light chain ratio 1.70 (H) 0.26 - 1.65  Multiple Myeloma Panel (SPEP&IFE w/QIG)  Result Value  Ref Range   IgG  (Immunoglobin G), Serum 1,734 (H) 700 - 1,600 mg/dL   IgA 140 87 - 352 mg/dL   IgM (Immunoglobulin M), Srm 45 26 - 217 mg/dL   Total Protein ELP 6.9 6.0 - 8.5 g/dL   Albumin SerPl Elph-Mcnc 3.6 2.9 - 4.4 g/dL   Alpha 1 0.2 0.0 - 0.4 g/dL   Alpha2 Glob SerPl Elph-Mcnc 0.9 0.4 - 1.0 g/dL   B-Globulin SerPl Elph-Mcnc 0.8 0.7 - 1.3 g/dL   Gamma Glob SerPl Elph-Mcnc 1.4 0.4 - 1.8 g/dL   M Protein SerPl Elph-Mcnc 0.9 (H) Not Observed g/dL   Globulin, Total 3.3 2.2 - 3.9 g/dL   Albumin/Glob SerPl 1.1 0.7 - 1.7   IFE 1 Comment    Please Note Comment       Assessment & Plan:   Problem List Items Addressed This Visit      Cardiovascular and Mediastinum   Hypertension - Primary    Chronic, ongoing.  BP at goal today and home BP below goal.  Continue current medication regimen.  Recent CMP done at [facility] (reviewed).      Relevant Medications   lisinopril (PRINIVIL,ZESTRIL) 10 MG tablet     Other   Pain of great toe    Bilateral great toes (on/off, none present today).  Obtain uric acid level today.        Relevant Orders   Uric acid    Other Visit Diagnoses    Screening cholesterol level       Lipid panel today   Relevant Orders   Lipid Panel w/o Chol/HDL Ratio       Follow up plan: Return in about 4 months (around 09/23/2018) for Annual physical.

## 2018-05-24 NOTE — Assessment & Plan Note (Addendum)
Chronic, ongoing.  BP at goal today and home BP below goal.  Continue current medication regimen.  Recent CMP done at [facility] (reviewed).

## 2018-05-24 NOTE — Assessment & Plan Note (Signed)
Bilateral great toes (on/off, none present today).  Obtain uric acid level today.

## 2018-05-24 NOTE — Patient Instructions (Signed)
Gout  Gout is painful swelling of your joints. Gout is a type of arthritis. It is caused by having too much uric acid in your body. Uric acid is a chemical that is made when your body breaks down substances called purines. If your body has too much uric acid, sharp crystals can form and build up in your joints. This causes pain and swelling. Gout attacks can happen quickly and be very painful (acute gout). Over time, the attacks can affect more joints and happen more often (chronic gout). What are the causes?  Too much uric acid in your blood. This can happen because: ? Your kidneys do not remove enough uric acid from your blood. ? Your body makes too much uric acid. ? You eat too many foods that are high in purines. These foods include organ meats, some seafood, and beer.  Trauma or stress. What increases the risk?  Having a family history of gout.  Being female and middle-aged.  Being female and having gone through menopause.  Being very overweight (obese).  Drinking alcohol, especially beer.  Not having enough water in the body (being dehydrated).  Losing weight too quickly.  Having an organ transplant.  Having lead poisoning.  Taking certain medicines.  Having kidney disease.  Having a skin condition called psoriasis. What are the signs or symptoms? An attack of acute gout usually happens in just one joint. The most common place is the big toe. Attacks often start at night. Other joints that may be affected include joints of the feet, ankle, knee, fingers, wrist, or elbow. Symptoms of an attack may include:  Very bad pain.  Warmth.  Swelling.  Stiffness.  Shiny, red, or purple skin.  Tenderness. The affected joint may be very painful to touch.  Chills and fever. Chronic gout may cause symptoms more often. More joints may be involved. You may also have white or yellow lumps (tophi) on your hands or feet or in other areas near your joints. How is this treated?   Treatment for this condition has two phases: treating an acute attack and preventing future attacks.  Acute gout treatment may include: ? NSAIDs. ? Steroids. These are taken by mouth or injected into a joint. ? Colchicine. This medicine relieves pain and swelling. It can be given by mouth or through an IV tube.  Preventive treatment may include: ? Taking small doses of NSAIDs or colchicine daily. ? Using a medicine that reduces uric acid levels in your blood. ? Making changes to your diet. You may need to see a food expert (dietitian) about what to eat and drink to prevent gout. Follow these instructions at home: During a gout attack   If told, put ice on the painful area: ? Put ice in a plastic bag. ? Place a towel between your skin and the bag. ? Leave the ice on for 20 minutes, 2-3 times a day.  Raise (elevate) the painful joint above the level of your heart as often as you can.  Rest the joint as much as possible. If the joint is in your leg, you may be given crutches.  Follow instructions from your doctor about what you cannot eat or drink. Avoiding future gout attacks  Eat a low-purine diet. Avoid foods and drinks such as: ? Liver. ? Kidney. ? Anchovies. ? Asparagus. ? Herring. ? Mushrooms. ? Mussels. ? Beer.  Stay at a healthy weight. If you want to lose weight, talk with your doctor. Do not lose weight  drink.  Avoiding future gout attacks   Eat a low-purine diet. Avoid foods and drinks such as:  ? Liver.  ? Kidney.  ? Anchovies.  ? Asparagus.  ? Herring.  ? Mushrooms.  ? Mussels.  ? Beer.   Stay at a healthy weight. If you want to lose weight, talk with your doctor. Do not lose weight too fast.   Start or continue an exercise plan as told by your doctor.  Eating and drinking   Drink enough fluids to keep your pee (urine) pale yellow.   If you drink alcohol:  ? Limit how much you use to:   0-1 drink a day for women.   0-2 drinks a day for men.  ? Be aware of how much alcohol is in your drink. In the [place] one drink equals one 12 oz bottle of beer (355 mL), one 5 oz glass of wine (148 mL), or one 1 oz glass of hard liquor (44 mL).  General instructions   Take over-the-counter and prescription medicines only as told by your doctor.   Do  not drive or use heavy machinery while taking prescription pain medicine.   Return to your normal activities as told by your doctor. Ask your doctor what activities are safe for you.   Keep all follow-up visits as told by your doctor. This is important.  Contact a doctor if:   You have another gout attack.   You still have symptoms of a gout attack after 10 days of treatment.   You have problems (side effects) because of your medicines.   You have chills or a fever.   You have burning pain when you pee (urinate).   You have pain in your lower back or belly.  Get help right away if:   You have very bad pain.   Your pain cannot be controlled.   You cannot pee.  Summary   Gout is painful swelling of the joints.   The most common site of pain is the big toe, but it can affect other joints.   Medicines and avoiding some foods can help to prevent and treat gout attacks.  This information is not intended to replace advice given to you by your health care provider. Make sure you discuss any questions you have with your health care provider.  Document Released: 12/04/2007 Document Revised: 09/16/2017 Document Reviewed: 09/16/2017  Elsevier Interactive Patient Education  2019 [employer]    Low-Purine Eating Plan  A low-purine eating plan involves making food choices to limit your intake of purine. Purine is a kind of uric acid. Too much uric acid in your blood can cause certain conditions, such as gout and kidney stones. Eating a low-purine diet can help control these conditions.  What are tips for following this plan?  Reading food labels     Avoid foods with saturated or Trans fat.   Check the ingredient list of grains-based foods, such as bread and cereal, to make sure that they contain whole grains.   Check the ingredient list of sauces or soups to make sure they do not contain meat or fish.   When choosing soft drinks, check the ingredient list to make sure they do not contain high-fructose corn  syrup.  Shopping   Buy plenty of fresh fruits and vegetables.   Avoid buying canned or fresh fish.   Buy dairy products labeled as low-fat or nonfat.   Avoid buying premade or processed foods. These foods are often   high in fat, salt (sodium), and added sugar.  Cooking   Use olive oil instead of butter when cooking. Oils like olive oil, canola oil, and sunflower oil contain healthy fats.  Meal planning   Learn which foods do or do not affect you. If you find out that a food tends to cause your gout symptoms to flare up, avoid eating that food. You can enjoy foods that do not cause problems. If you have any questions about a food item, talk with your dietitian or health care provider.   Limit foods high in fat, especially saturated fat. Fat makes it harder for your body to get rid of uric acid.   Choose foods that are lower in fat and are lean sources of protein.  General guidelines   Limit alcohol intake to no more than 1 drink a day for nonpregnant women and 2 drinks a day for men. One drink equals 12 oz of beer, 5 oz of wine, or 1 oz of hard liquor. Alcohol can affect the way your body gets rid of uric acid.   Drink plenty of water to keep your urine clear or pale yellow. Fluids can help remove uric acid from your body.   If directed by your health care provider, take a vitamin C supplement.   Work with your health care provider and dietitian to develop a plan to achieve or maintain a healthy weight. Losing weight can help reduce uric acid in your blood.  What foods are recommended?  The items listed may not be a complete list. Talk with your dietitian about what dietary choices are best for you.  Foods low in purines  Foods low in purines do not need to be limited. These include:   All fruits.   All low-purine vegetables, pickles, and olives.   Breads, pasta, rice, cornbread, and popcorn. Cake and other baked goods.   All dairy foods.   Eggs, nuts, and nut butters.   Spices and condiments, such  as salt, herbs, and vinegar.   Plant oils, butter, and margarine.   Water, sugar-free soft drinks, tea, coffee, and cocoa.   Vegetable-based soups, broths, sauces, and gravies.  Foods moderate in purines  Foods moderate in purines should be limited to the amounts listed.    cup of asparagus, cauliflower, spinach, mushrooms, or green peas, each day.   2/3 cup uncooked oatmeal, each day.    cup dry wheat bran or wheat germ, each day.   2-3 ounces of meat or poultry, each day.   4-6 ounces of shellfish, such as crab, lobster, oysters, or shrimp, each day.   1 cup cooked beans, peas, or lentils, each day.   Soup, broths, or bouillon made from meat or fish. Limit these foods as much as possible.  What foods are not recommended?  The items listed may not be a complete list. Talk with your dietitian about what dietary choices are best for you.  Limit your intake of foods high in purines, including:   Beer and other alcohol.   Meat-based gravy or sauce.   Canned or fresh fish, such as:  ? Anchovies, sardines, herring, and tuna.  ? Mussels and scallops.  ? Codfish, trout, and haddock.   Bacon.   Organ meats, such as:  ? Liver or kidney.  ? Tripe.  ? Sweetbreads (thymus gland or pancreas).   Wild game or goose.   Yeast or yeast extract supplements.   Drinks sweetened with high-fructose corn syrup.  Summary  

## 2018-05-25 LAB — LIPID PANEL W/O CHOL/HDL RATIO
Cholesterol, Total: 210 mg/dL — ABNORMAL HIGH (ref 100–199)
HDL: 66 mg/dL (ref >39)
LDL Calculated: 130 mg/dL — ABNORMAL HIGH (ref 0–99)
Triglycerides: 68 mg/dL (ref 0–149)
VLDL Cholesterol Cal: 14 mg/dL (ref 5–40)

## 2018-05-25 LAB — URIC ACID: Uric Acid: 5.8 mg/dL (ref 2.5–7.1)

## 2018-05-31 ENCOUNTER — Telehealth: Payer: Self-pay | Admitting: Licensed Clinical Social Worker

## 2018-05-31 NOTE — Telephone Encounter (Signed)
R/s appointment at [facility] to appointment at [facility] 08/09/2018 at 10 am.

## 2018-06-03 ENCOUNTER — Inpatient Hospital Stay: Payer: Managed Care, Other (non HMO) | Admitting: Licensed Clinical Social Worker

## 2018-06-14 ENCOUNTER — Ambulatory Visit
Admission: RE | Admit: 2018-06-14 | Discharge: 2018-06-14 | Disposition: A | Discharge status: Home / Self Care | Payer: Managed Care, Other (non HMO) | Source: Ambulatory Visit | Attending: General Surgery | Admitting: General Surgery

## 2018-06-14 ENCOUNTER — Other Ambulatory Visit: Payer: Self-pay

## 2018-06-14 DIAGNOSIS — Z171 Estrogen receptor negative status [ER-]: Secondary | ICD-10-CM | POA: Insufficient documentation

## 2018-06-14 DIAGNOSIS — C50811 Malignant neoplasm of overlapping sites of right female breast: Secondary | ICD-10-CM

## 2018-06-16 ENCOUNTER — Other Ambulatory Visit: Payer: Self-pay | Admitting: Nurse Practitioner

## 2018-06-16 ENCOUNTER — Telehealth: Payer: Self-pay | Admitting: Nurse Practitioner

## 2018-06-16 MED ORDER — ALBUTEROL SULFATE HFA 108 (90 BASE) MCG/ACT IN AERS: 2.0000 | INHALATION_SPRAY | Freq: Four times a day (QID) | RESPIRATORY_TRACT | 3 refills | Status: DC | PRN

## 2018-06-16 NOTE — Telephone Encounter (Signed)
Copied from Adrienne [name] [id number]. Topic: Quick Communication - Rx Refill/Question >> Jun 16, 2018  8:21 AM Keene Breath wrote: Medication: albuterol (PROVENTIL HFA;VENTOLIN HFA) 108 (90 Base) MCG/ACT inhaler  Patient called to request a refill for the above medication  Preferred Pharmacy (with phone number or street name): [facility] [place], [place] [phone] (Phone) [phone] (Fax)

## 2018-06-16 NOTE — Progress Notes (Signed)
Albuterol refill sent.

## 2018-06-22 ENCOUNTER — Ambulatory Visit: Payer: Managed Care, Other (non HMO) | Admitting: General Surgery

## 2018-07-27 ENCOUNTER — Other Ambulatory Visit: Payer: Self-pay

## 2018-07-27 ENCOUNTER — Ambulatory Visit (INDEPENDENT_AMBULATORY_CARE_PROVIDER_SITE_OTHER): Payer: Managed Care, Other (non HMO) | Admitting: General Surgery

## 2018-07-27 ENCOUNTER — Encounter: Payer: Self-pay | Admitting: General Surgery

## 2018-07-27 VITALS — BP 139/76 | HR 80 | Temp 97.7°F | Ht 65.0 in | Wt 211.0 lb

## 2018-07-27 DIAGNOSIS — C50811 Malignant neoplasm of overlapping sites of right female breast: Secondary | ICD-10-CM

## 2018-07-27 DIAGNOSIS — Z853 Personal history of malignant neoplasm of breast: Secondary | ICD-10-CM | POA: Diagnosis not present

## 2018-07-27 DIAGNOSIS — Z171 Estrogen receptor negative status [ER-]: Secondary | ICD-10-CM

## 2018-07-27 NOTE — Patient Instructions (Addendum)
The patient is aware to call back for any questions or new concerns. The patient has been asked to return to the office in one year with a bilateral diagnostic mammogram.

## 2018-07-27 NOTE — Progress Notes (Signed)
Patient ID: Adrienne [name], female   DOB: 1960-03-15, 58 y.o.   MRN: [id number]  Chief Complaint  Patient presents with  . Follow-up    HPI Adrienne [name] is a 58 y.o. female who presents for a breast evaluation. The most recent mammogram was done on 06/14/18. Patient does perform regular self breast checks and gets regular mammograms done.   Admits to mild discomfort left breast, Aleve does help.  HPI  Past Medical History:  Diagnosis Date  . Anemia   . Asthma   . BRCA negative 09/22/2014  . Breast cancer ([place]) 2012   LT LUMPECTOMY  . Breast cancer ([place]) 2016   RT LUMPECTOMY  . Breast cyst 2010   cyst- drained  . Breast screening, unspecified   . Constipation   . Dry eyes 2012  . GERD (gastroesophageal reflux disease)   . Hematuria   . Kidney stone   . Lump or mass in breast   . Malignant neoplasm of breast (female), unspecified site 07/08/2014   Right breast, 5 mm, T1a,N0, triple negative; high risk for recurrence on Mammoprint.  . Malignant neoplasm of upper-outer quadrant of female breast ([place]) 05/08/2010   Left breast: T1b, N0, M); triple negative, DCIS present. No chemotherapy. MammoSite radiation.  . Neuropathy    pt states in fingertips and feet  . Obesity, unspecified   . Pancreatic lesion   . Personal history of chemotherapy 2016   BREAST CA  . 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 Personal history of radiation therapy 2012   BREAST CA - MAMMOSITE  . Personal history of radiation therapy 2016   BREAST CA - MAMMOSITE  . Screening for obesity   . Seasonal allergies   . Special screening for malignant neoplasms, colon   . Unspecified essential hypertension     Past Surgical History:  Procedure Laterality Date  . BREAST BIOPSY    . BREAST CYST ASPIRATION Left 2010  .  BREAST LUMPECTOMY Left 2012   BREAST CA  . BREAST LUMPECTOMY Right 2016   INVASIVE MAMMARY CARCINOMA  . BREAST LUMPECTOMY WITH AXILLARY LYMPH NODE BIOPSY Right 08/01/2014   Procedure: BREAST LUMPECTOMY WITH AXILLARY LYMPH NODE BIOPSY;  Surgeon: Robert Bellow, MD;  Location: [facility] ORS;  Service: General;  Laterality: Right;  . BREAST MAMMOSITE  April 2012   mammosite placement and removal   . BREAST SURGERY Left 2012   left breast wide excision, sentinel node, MammoSite  . BREAST SURGERY Right 08/06/2014   Wide excision/sentinel node biopsy, T1a, N0 triple negative, MammoSite  . CESAREAN SECTION  1988  . COLONOSCOPY  October 17, 2009   Dr. Dionne Milo, normal study.  Marland Kitchen KIDNEY STONE SURGERY  2003   stones removed by Dr. Quillian Quince- stent placed/removed  . PORTACATH PLACEMENT Left 11/09/2014   Procedure: INSERTION PORT-A-CATH;  Surgeon: Robert Bellow, MD;  Location: [facility] ORS;  Service: General;  Laterality: Left;  . SENTINEL NODE BIOPSY Right 08/01/2014   Procedure: SENTINEL NODE BIOPSY;  Surgeon: Robert Bellow, MD;  Location: [facility] ORS;  Service: General;  Laterality: Right;  . TUBAL LIGATION      Family History  Problem Relation Age of Onset  . Ovarian cancer Mother   . Diabetes Mother   . Cancer Father  prostate  . Diabetes Father   . Hypertension Brother   . Cancer Maternal Grandmother        bone  . Cancer Maternal Grandfather        lung  . Cancer Paternal Grandmother        lung  . Asthma Paternal Grandfather   . Hypertension Sister   . Breast cancer Maternal Aunt 60    Social History Social History   Tobacco Use  . Smoking status: Never Smoker  . Smokeless tobacco: Never Used  Substance Use Topics  . Alcohol use: Yes    Comment: occasionally  . Drug use: No    Allergies  Allergen Reactions  . Cayenne Pepper [Cayenne] Shortness Of Breath  . Shellfish Allergy Other (See Comments)    Hives and swelling on ears and feet  . Tape Rash    Surgical tape  from breast biopsy    Current Outpatient Medications  Medication Sig Dispense Refill  . albuterol (PROVENTIL HFA;VENTOLIN HFA) 108 (90 Base) MCG/ACT inhaler Inhale 2 puffs into the lungs every 6 (six) hours as needed for wheezing or shortness of breath. 1 Inhaler 3  . aspirin 81 MG tablet Take 81 mg by mouth daily.    . bisacodyl (DULCOLAX) 5 MG EC tablet Take 5 mg by mouth daily as needed for moderate constipation.    . Cholecalciferol (VITAMIN D3) 1000 UNITS CAPS Take 1 capsule by mouth daily.     Marland Kitchen esomeprazole (NEXIUM) 40 MG capsule Take 40 mg by mouth 2 (two) times daily as needed (acid reflux).     Marland Kitchen lisinopril (PRINIVIL,ZESTRIL) 10 MG tablet Take 1 tablet (10 mg total) by mouth daily. 90 tablet 2  . loratadine (CLARITIN) 10 MG tablet Take 10 mg by mouth daily. Reported on 04/13/2015    . meclizine (ANTIVERT) 12.5 MG tablet Take 1 tablet (12.5 mg total) by mouth 3 (three) times daily as needed for dizziness. 40 tablet 0  . polyethylene glycol (MIRALAX / GLYCOLAX) packet Take 17 g by mouth every other day.     No current facility-administered medications for this visit.    Facility-Administered Medications Ordered in Other Visits  Medication Dose Route Frequency Provider Last Rate Last Dose  . sodium chloride 0.9 % injection 10 mL  10 mL Intravenous PRN Leia Alf, MD   10 mL at 11/10/14 1138    Review of Systems Review of Systems  Constitutional: Negative.   Respiratory: Negative.   Cardiovascular: Negative.     Blood pressure 139/76, pulse 80, temperature 97.7 F (36.5 C), temperature source Skin, height 5' 5"  (1.651 m), weight 211 lb (95.7 kg), SpO2 99 %.  Physical Exam Physical Exam Exam conducted with a chaperone present.  Constitutional:      Appearance: She is well-developed.  Eyes:     General: No scleral icterus.    Conjunctiva/sclera: Conjunctivae normal.  Neck:     Musculoskeletal: Neck supple.  Cardiovascular:     Rate and Rhythm: Normal rate and regular  rhythm.     Heart sounds: Normal heart sounds.  Pulmonary:     Effort: Pulmonary effort is normal.     Breath sounds: Normal breath sounds.  Chest:     Breasts:        Left: Tenderness present.    Lymphadenopathy:     Cervical: No cervical adenopathy.  Skin:    General: Skin is warm and dry.  Neurological:     Mental Status: She is alert and oriented  to person, place, and time.  Psychiatric:        Behavior: Behavior normal.     Data Reviewed June 10, 2018 bilateral diagnostic mammograms were personally reviewed.  Postsurgical changes.  BI-RADS-2.  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  Posttreatment changes noted bilaterally.  Plan  The patient is aware to call back for any questions or new concerns. The patient has been asked to return to the office in one year with a bilateral diagnostic mammogram.   HPI, assessment, plan and physical exam has been scribed under the direction and in the presence of Robert Bellow, MD. Karie Fetch, RN  HPI, Physical Exam, Assessment and Plan have been scribed under the direction and in the presence of Robert Bellow, MD  Concepcion Living, LPN  I have completed the exam and reviewed the above documentation for accuracy and completeness.  I agree with the above.  Haematologist has been used and any errors in dictation or transcription are unintentional.  Hervey Ard, M.D., F.A.C.S.  Adrienne [name] 07/28/2018, 2:10 PM

## 2018-08-05 ENCOUNTER — Telehealth: Payer: Self-pay | Admitting: Genetic Counselor

## 2018-08-05 NOTE — Telephone Encounter (Signed)
Called patient regarding upcoming [facility] appointment, patient prefers this to be a walk-in visit.

## 2018-08-09 ENCOUNTER — Other Ambulatory Visit: Payer: Self-pay | Admitting: Genetic Counselor

## 2018-08-09 ENCOUNTER — Other Ambulatory Visit: Payer: Self-pay

## 2018-08-09 ENCOUNTER — Inpatient Hospital Stay: Payer: Managed Care, Other (non HMO)

## 2018-08-09 ENCOUNTER — Inpatient Hospital Stay: Payer: Managed Care, Other (non HMO) | Attending: Genetic Counselor | Admitting: Genetic Counselor

## 2018-08-09 ENCOUNTER — Encounter: Payer: Self-pay | Admitting: Genetic Counselor

## 2018-08-09 DIAGNOSIS — Z8041 Family history of malignant neoplasm of ovary: Secondary | ICD-10-CM | POA: Insufficient documentation

## 2018-08-09 DIAGNOSIS — Z171 Estrogen receptor negative status [ER-]: Secondary | ICD-10-CM

## 2018-08-09 DIAGNOSIS — C50811 Malignant neoplasm of overlapping sites of right female breast: Secondary | ICD-10-CM | POA: Diagnosis not present

## 2018-08-09 DIAGNOSIS — Z8042 Family history of malignant neoplasm of prostate: Secondary | ICD-10-CM | POA: Diagnosis not present

## 2018-08-09 DIAGNOSIS — Z803 Family history of malignant neoplasm of breast: Secondary | ICD-10-CM | POA: Insufficient documentation

## 2018-08-09 NOTE — Progress Notes (Signed)
REFERRING PROVIDER: Cammie Sickle, MD [place], [place] [address]  PRIMARY PROVIDER:  Venita Lick, NP  PRIMARY REASON FOR VISIT:  1. Malignant neoplasm of overlapping sites of right breast in female, estrogen receptor negative ([place])   2. Family history of ovarian cancer   3. Family history of prostate cancer   4. Family history of breast cancer      HISTORY OF PRESENT ILLNESS:   Adrienne [name], a 58 y.o. female, was seen for a [facility] cancer genetics consultation at the request of Dr. Rogue Bussing due to a personal and family history of cancer.  Adrienne [name] presents to clinic today to discuss the possibility of a hereditary predisposition to cancer, genetic testing, and to further clarify her future cancer risks, as well as potential cancer risks for family members.   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 The treatment plan lumpectomy and radiation.  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  This was treated with lumpectomy, chemotherapy and radiation.  She had genetic testing at this time for BRCA1 and BRCA2, which is reportedly negative.     CANCER HISTORY:  Oncology History   # MAY 2016- RIGHT BREAST CANCER- Triple negative [T=0.5cm;G-3; margins-Neg]; Mammaprint- 0.814/molecular basal type [5 year risk- 22%; 10 year risk-29%] AC q3 W- taxol q w x 8/ 12 [sec to PN; finished Feb 2017].   # 2012 LEFT BREAST CA STAGE I s/p Lumpec [T1 (0.7CM);Triple Neg; G-3]; s/p mammosite RT; No adj chemo  #  Mild Anemia/M-protein 0.8gm/dl [sep 2016]  # BRCA- 1&2 NEGATIVE [july 2016]  # PN s/p acupuncture  --------------------------------------------------     DIAGNOSIS: breast cancer  STAGE:   I      ;GOALS: cure  CURRENT/MOST RECENT THERAPY: surveillaince       Malignant neoplasm of overlapping sites of right breast in female, estrogen receptor negative ([place])      RISK FACTORS:  Menarche was at age 33.  First live birth at age 24.  OCP use for approximately 10 years.  Ovaries intact: yes.  Hysterectomy: no.  Menopausal status: postmenopausal.  HRT use: 0 years. Colonoscopy: yes; normal. Mammogram within the last year: yes. Number of breast biopsies: 2. Up to date with pelvic exams: yes. Any excessive radiation exposure in the past: breast cancer treatment  Past Medical History:  Diagnosis Date  . Anemia   . Asthma   . BRCA negative 09/22/2014  . Breast cancer ([place]) 2012   LT LUMPECTOMY  . Breast cancer ([place]) 2016   RT LUMPECTOMY  . Breast cyst 2010   cyst- drained  . Breast screening, unspecified   . Constipation   . Dry eyes 2012  . Family history of breast cancer   . Family history of ovarian cancer   . Family history of prostate cancer   . GERD (gastroesophageal reflux disease)   . Hematuria   . Kidney stone   . Lump or mass in breast   . Malignant neoplasm of breast (female), unspecified site 07/08/2014   Right breast, 5 mm, T1a,N0, triple negative; high risk for recurrence on Mammoprint.  . Malignant neoplasm of upper-outer quadrant of female breast ([place]) 05/08/2010   Left breast: T1b, N0, M); triple negative, DCIS present. No chemotherapy. MammoSite radiation.  . Neuropathy    pt states in fingertips and feet  . Obesity, unspecified   .  Pancreatic lesion   . Personal history of chemotherapy 2016   BREAST CA  . 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 Personal history of radiation therapy 2012   BREAST CA - MAMMOSITE  . Personal history of radiation therapy 2016   BREAST CA - MAMMOSITE  . Screening for obesity   . Seasonal allergies   . Special screening for malignant neoplasms, colon   . Unspecified essential hypertension      Past Surgical History:  Procedure Laterality Date  . BREAST BIOPSY    . BREAST CYST ASPIRATION Left 2010  . BREAST LUMPECTOMY Left 2012   BREAST CA  . BREAST LUMPECTOMY Right 2016   INVASIVE MAMMARY CARCINOMA  . BREAST LUMPECTOMY WITH AXILLARY LYMPH NODE BIOPSY Right 08/01/2014   Procedure: BREAST LUMPECTOMY WITH AXILLARY LYMPH NODE BIOPSY;  Surgeon: Robert Bellow, MD;  Location: [facility] ORS;  Service: General;  Laterality: Right;  . BREAST MAMMOSITE  April 2012   mammosite placement and removal   . BREAST SURGERY Left 2012   left breast wide excision, sentinel node, MammoSite  . BREAST SURGERY Right 08/06/2014   Wide excision/sentinel node biopsy, T1a, N0 triple negative, MammoSite  . CESAREAN SECTION  1988  . COLONOSCOPY  October 17, 2009   Dr. Dionne Milo, normal study.  Marland Kitchen KIDNEY STONE SURGERY  2003   stones removed by Dr. Quillian Quince- stent placed/removed  . PORTACATH PLACEMENT Left 11/09/2014   Procedure: INSERTION PORT-A-CATH;  Surgeon: Robert Bellow, MD;  Location: [facility] ORS;  Service: General;  Laterality: Left;  . SENTINEL NODE BIOPSY Right 08/01/2014   Procedure: SENTINEL NODE BIOPSY;  Surgeon: Robert Bellow, MD;  Location: [facility] ORS;  Service: General;  Laterality: Right;  . TUBAL LIGATION      Social History   Socioeconomic History  . Marital status: Single    Spouse name: Not on file  . Number of children: Not on file  . Years of education: Not on file  . Highest education level: Not on file  Occupational History  . Not on file  Social Needs  . Financial resource strain: Not on file  . Food insecurity:    Worry: Not on file    Inability: Not on file  . Transportation needs:    Medical: Not on file    Non-medical: Not on file  Tobacco Use  . Smoking status: Never Smoker  . Smokeless tobacco: Never Used  Substance and Sexual Activity  . Alcohol use: Yes    Comment: occasionally  . Drug use: No  . Sexual activity: Yes  Lifestyle  . Physical activity:     Days per week: Not on file    Minutes per session: Not on file  . Stress: Not on file  Relationships  . Social connections:    Talks on phone: Not on file    Gets together: Not on file    Attends religious service: Not on file    Active member of club or organization: Not on file    Attends meetings of clubs or organizations: Not on file    Relationship status: Not on file  Other Topics Concern  . Not on file  Social History Narrative  . Not on file     FAMILY HISTORY:  We obtained a detailed, 4-generation family  history.  Significant diagnoses are listed below: Family History  Problem Relation Age of Onset  . Ovarian cancer Mother   . Diabetes Mother   . Diabetes Father   . Prostate cancer Father        dx in his 51s  . Hypertension Brother   . Cancer Maternal Grandmother        bone  . Cancer Maternal Grandfather        lung  . Cancer Paternal Grandmother        lung  . Asthma Paternal Grandfather   . Hypertension Sister   . Prostate cancer Brother 37  . AAA (abdominal aortic aneurysm) Maternal Uncle     The patient has two sons and a daughter who are all cancer free.  She has two brothers and two sisters.  One brother was diagnosed with prostate cancer at age 23.  The patient's father is deceased and her mother is living.  The patient's mother was diagnosed with ovarian cancer in her 78's.  She had two brothers and a sister.  One brother died of an AAA.  The remainder of her siblings are cancer free.  Her mother had bone cancer and her father had lung cancer.  The mother's maternal half sister had breast cancer.  The patient's father had prostate cancer in his 37's.  He had a brother and sister.  The sister had an unknown cancer in her 49's.  His mother had lung cancer and his father died of Asthma.  Adrienne [name] is unaware of previous family history of genetic testing for hereditary cancer risks. Patient's maternal ancestors are of African American descent, and  paternal ancestors are of African American descent. There is no reported Ashkenazi Jewish ancestry. There is no known consanguinity.  GENETIC COUNSELING ASSESSMENT: Ms. Yera is a 58 y.o. female with a personal and family history of cancer which is somewhat suggestive of a hereditary cancer syndrome and predisposition to cancer. We, therefore, discussed and recommended the following at today's visit.   DISCUSSION: We discussed that 5 - 10% of breast cancer is hereditary, with most cases associated with BRCA mutations.  Adrienne [name] had genetic testing for BRCA mutations in 2016 that was reportedly negative.  There are other genes that can be associated with hereditary breast cancer syndromes.  PALB2 is one gene that can be associated with triple negative breast cancer.  We discussed that since her last testing, we have expanded genetic testing to include other genes that can in crease there risk for breast, ovarian and other cancers.  We discussed that testing is beneficial for several reasons including knowing how to follow individuals after completing their treatment, identifying whether potential treatment options such as PARP inhibitors would be beneficial, and understand if other family members could be at risk for cancer and allow them to undergo genetic testing.   We reviewed the characteristics, features and inheritance patterns of hereditary cancer syndromes. We also discussed genetic testing, including the appropriate family members to test, the process of testing, insurance coverage and turn-around-time for results. We discussed the implications of a negative, positive and/or variant of uncertain significant result. We recommended Adrienne [name] pursue genetic testing for the CustomNext-Cancer+RNAinsight gene panel. The CustomNext-Expanded gene panel offered by [facility] and includes sequencing and rearrangement analysis for the following 81 genes: AIP, ALK, APC*, ATM*, AXIN2, BAP1, BARD1, BLM, BMPR1A,  BRCA1*, BRCA2*, BRIP1*, CDC73, CDH1*, CDK4, CDKN1B, CDKN2A, CHEK2*, CTNNA1, DICER1, FANCC, FH, FLCN, GALNT12, HOXB13, KIT, MAX, MEN1,  MET, MLH1*, MRE11A, MSH2*, MSH6*, MUTYH*, NBN, NF1*, NF2, NTHL1, PALB2*, PDGFRA, PHOX2B, PMS2*, POLD1, POLE, POT1, PRKAR1A, PTCH1, PTEN*, RAD50, RAD51C*, RAD51D*, RB1, RET, SDHA, SDHAF2, SDHB, SDHC, SDHD, SMAD4, SMARCA4, SMARCB1, SMARCE1, STK11, SUFU, TMEM127, TP53*, TSC1, TSC2, VHL and XRCC2 (sequencing and deletion/duplication); CASR, CFTR, CPA1, CTRC, EGFR, MITF, PRSS1 and SPINK1 (sequencing only); EPCAM and GREM1 (deletion/duplication only). DNA and RNA analyses performed for * genes.   Based on Ms. Stoneham's personal and family history of cancer, she meets medical criteria for genetic testing. Despite that she meets criteria, she may still have an out of pocket cost. We discussed that if her out of pocket cost for testing is over $100, the laboratory will call and confirm whether she wants to proceed with testing.  If the out of pocket cost of testing is less than $100 she will be billed by the genetic testing laboratory.   PLAN: After considering the risks, benefits, and limitations, Adrienne [name] provided informed consent to pursue genetic testing and the blood sample was sent to [facility] for analysis of the CustomNext-Cancer+RNAinsight. Results should be available within approximately 2-3 weeks' time, at which point they will be disclosed by telephone to Adrienne [name], as will any additional recommendations warranted by these results. Adrienne [name] will receive a summary of her genetic counseling visit and a copy of her results once available. This information will also be available in Epic.   Lastly, we encouraged Adrienne [name] to remain in contact with cancer genetics annually so that we can continuously update the family history and inform her of any changes in cancer genetics and testing that may be of benefit for this family.   Adrienne [name] questions were answered  to her satisfaction today. Our contact information was provided should additional questions or concerns arise. Thank you for the referral and allowing [place] to share in the care of your patient.   Maribeth [name] P. Florene Glen, [place], [facility] Certified Genetic Counselor [email or web address] phone: [phone]  The patient was seen for a total of 45 minutes in face-to-face genetic counseling.  This patient was discussed with Drs. Magrinat, Lindi Adie and/or Burr Medico who agrees with the above.    _______________________________________________________________________ For Office Staff:  Number of people involved in session: 1 Was an Intern/ student involved with case: no

## 2018-08-25 ENCOUNTER — Telehealth: Payer: Self-pay | Admitting: Internal Medicine

## 2018-08-25 NOTE — Telephone Encounter (Signed)
Called pt for appt pre-screen but got no answer. Left vm msg about screening and new guidelines about mask req, no visitors, screening questions they will be asked, and fever checks °

## 2018-08-26 ENCOUNTER — Inpatient Hospital Stay: Payer: Managed Care, Other (non HMO) | Attending: Internal Medicine

## 2018-08-26 ENCOUNTER — Inpatient Hospital Stay (HOSPITAL_BASED_OUTPATIENT_CLINIC_OR_DEPARTMENT_OTHER): Payer: Managed Care, Other (non HMO) | Admitting: Internal Medicine

## 2018-08-26 ENCOUNTER — Other Ambulatory Visit: Payer: Self-pay

## 2018-08-26 DIAGNOSIS — Z171 Estrogen receptor negative status [ER-]: Secondary | ICD-10-CM

## 2018-08-26 DIAGNOSIS — D472 Monoclonal gammopathy: Secondary | ICD-10-CM

## 2018-08-26 DIAGNOSIS — Z853 Personal history of malignant neoplasm of breast: Secondary | ICD-10-CM | POA: Diagnosis not present

## 2018-08-26 DIAGNOSIS — G620 Drug-induced polyneuropathy: Secondary | ICD-10-CM | POA: Diagnosis not present

## 2018-08-26 DIAGNOSIS — C50811 Malignant neoplasm of overlapping sites of right female breast: Secondary | ICD-10-CM

## 2018-08-26 LAB — COMPREHENSIVE METABOLIC PANEL
ALT: 14 U/L (ref 0–44)
AST: 17 U/L (ref 15–41)
Albumin: 4 g/dL (ref 3.5–5.0)
Alkaline Phosphatase: 77 U/L (ref 38–126)
Anion gap: 9 (ref 5–15)
BUN: 19 mg/dL (ref 6–20)
CO2: 25 mmol/L (ref 22–32)
Calcium: 9.1 mg/dL (ref 8.9–10.3)
Chloride: 103 mmol/L (ref 98–111)
Creatinine, Ser: 0.9 mg/dL (ref 0.44–1.00)
GFR calc Af Amer: >60 mL/min (ref >60)
GFR calc non Af Amer: >60 mL/min (ref >60)
Glucose, Bld: 174 mg/dL — ABNORMAL HIGH (ref 70–99)
Potassium: 3.5 mmol/L (ref 3.5–5.1)
Sodium: 137 mmol/L (ref 135–145)
Total Bilirubin: 0.4 mg/dL (ref 0.3–1.2)
Total Protein: 8 g/dL (ref 6.5–8.1)

## 2018-08-26 LAB — CBC WITH DIFFERENTIAL/PLATELET
Abs Immature Granulocytes: 0.03 10*3/uL (ref 0.00–0.07)
Basophils Absolute: 0 10*3/uL (ref 0.0–0.1)
Basophils Relative: 1 %
Eosinophils Absolute: 0.2 10*3/uL (ref 0.0–0.5)
Eosinophils Relative: 2 %
HCT: 30.2 % — ABNORMAL LOW (ref 36.0–46.0)
Hemoglobin: 10 g/dL — ABNORMAL LOW (ref 12.0–15.0)
Immature Granulocytes: 0 %
Lymphocytes Relative: 24 %
Lymphs Abs: 1.8 10*3/uL (ref 0.7–4.0)
MCH: 32.1 pg (ref 26.0–34.0)
MCHC: 33.1 g/dL (ref 30.0–36.0)
MCV: 96.8 fL (ref 80.0–100.0)
Monocytes Absolute: 0.6 10*3/uL (ref 0.1–1.0)
Monocytes Relative: 7 %
Neutro Abs: 5 10*3/uL (ref 1.7–7.7)
Neutrophils Relative %: 66 %
Platelets: 204 10*3/uL (ref 150–400)
RBC: 3.12 MIL/uL — ABNORMAL LOW (ref 3.87–5.11)
RDW: 14.3 % (ref 11.5–15.5)
WBC: 7.7 10*3/uL (ref 4.0–10.5)
nRBC: 0 % (ref 0.0–0.2)

## 2018-08-26 NOTE — Progress Notes (Signed)
Patient does not offer any problems today.  

## 2018-08-26 NOTE — Assessment & Plan Note (Addendum)
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  Clinically no evidence of recurrence; April 2020-  mammogram normal.  Continue surveillance.  Stable.  # Peripheral neuropathy- sec to chemo. grade 1-2 stable  # chronic mild anemia-unclear etiology.  Hb 10-stable  #Genetic counseling-patient is BRCA1 and 2- negative.  However, given history of bilateral breast cancer/ triple negative breast cancer-s/p genetic counseling.  Reviewed genetics note.  Awaiting genetic testing.  # MGUS- 1gm/dl; reviewed natural history of MGUS.  Will repeat again in 6 months  # DISPOSITION: # follow up in 6 months-MD /labs- cbc/cmp/ca-27-29/MM panel;K-L panel-Dr.B

## 2018-08-26 NOTE — Progress Notes (Signed)
[place] OFFICE PROGRESS NOTE  Patient Care Team: Venita Lick, NP as PCP - General (Nurse Practitioner) Bary Castilla Forest Gleason, MD (General Surgery) Cammie Sickle, MD as Consulting Physician (Internal Medicine)   SUMMARY OF ONCOLOGIC HISTORY:  Oncology History Overview Note  # MAY 2016- RIGHT BREAST CANCER- Triple negative [T=0.5cm;G-3; margins-Neg]; Mammaprint- 0.814/molecular basal type [5 year risk- 22%; 10 year risk-29%] AC q3 W- taxol q w x 8/ 12 [sec to PN; finished Feb 2017].   # 2012 LEFT BREAST CA STAGE I s/p Lumpec [T1 (0.7CM);Triple Neg; G-3]; s/p mammosite RT; No adj chemo  #  Mild Anemia/M-protein 0.8gm/dl [sep 2016]  # BRCA- 1&2 NEGATIVE [july 2016]  # PN s/p acupuncture  --------------------------------------------------     DIAGNOSIS: breast cancer  STAGE:   I      ;GOALS: cure  CURRENT/MOST RECENT THERAPY: surveillaince     Malignant neoplasm of overlapping sites of right breast in female, estrogen receptor negative ([place])    INTERVAL HISTORY:  A pleasant 58 year old female patient with above history of stage I right-sided triple negative breast cancer - Is here for follow-up.  Patient continues to have intermittent tingling and numbness extremities-however not getting worse.   Denies any lumps or bumps.  No headaches.  No vomiting.  Review of Systems  Constitutional: Negative for chills, diaphoresis, fever, malaise/fatigue and weight loss.  HENT: Negative for nosebleeds and sore throat.   Eyes: Negative for double vision.  Respiratory: Negative for cough, hemoptysis, sputum production, shortness of breath and wheezing.   Cardiovascular: Negative for chest pain, palpitations, orthopnea and leg swelling.  Gastrointestinal: Negative for abdominal pain, blood in stool, constipation, diarrhea, heartburn, melena, nausea and vomiting.  Genitourinary: Negative for dysuria, frequency and urgency.  Musculoskeletal: Negative for  back pain and joint pain.  Skin: Negative.  Negative for itching and rash.  Neurological: Positive for tingling. Negative for dizziness, focal weakness, weakness and headaches.  Endo/Heme/Allergies: Does not bruise/bleed easily.  Psychiatric/Behavioral: Negative for depression. The patient is not nervous/anxious and does not have insomnia.      PAST MEDICAL HISTORY :  Past Medical History:  Diagnosis Date  . Anemia   . Asthma   . BRCA negative 09/22/2014  . Breast cancer ([place]) 2012   LT LUMPECTOMY  . Breast cancer ([place]) 2016   RT LUMPECTOMY  . Breast cyst 2010   cyst- drained  . Breast screening, unspecified   . Constipation   . Dry eyes 2012  . Family history of breast cancer   . Family history of ovarian cancer   . Family history of prostate cancer   . GERD (gastroesophageal reflux disease)   . Hematuria   . Kidney stone   . Lump or mass in breast   . Malignant neoplasm of breast (female), unspecified site 07/08/2014   Right breast, 5 mm, T1a,N0, triple negative; high risk for recurrence on Mammoprint.  . Malignant neoplasm of upper-outer quadrant of female breast ([place]) 05/08/2010   Left breast: T1b, N0, M); triple negative, DCIS present. No chemotherapy. MammoSite radiation.  . Neuropathy    pt states in fingertips and feet  . Obesity, unspecified   . Pancreatic lesion   . Personal history of chemotherapy 2016   BREAST CA  . 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  Personal history of radiation therapy 2012   BREAST CA - MAMMOSITE  . Personal history of radiation therapy 2016   BREAST CA - MAMMOSITE  . Screening for obesity   . Seasonal allergies   . Special screening for malignant neoplasms, colon   . Unspecified essential hypertension     PAST SURGICAL HISTORY :   Past  Surgical History:  Procedure Laterality Date  . BREAST BIOPSY    . BREAST CYST ASPIRATION Left 2010  . BREAST LUMPECTOMY Left 2012   BREAST CA  . BREAST LUMPECTOMY Right 2016   INVASIVE MAMMARY CARCINOMA  . BREAST LUMPECTOMY WITH AXILLARY LYMPH NODE BIOPSY Right 08/01/2014   Procedure: BREAST LUMPECTOMY WITH AXILLARY LYMPH NODE BIOPSY;  Surgeon: Robert Bellow, MD;  Location: [facility] ORS;  Service: General;  Laterality: Right;  . BREAST MAMMOSITE  April 2012   mammosite placement and removal   . BREAST SURGERY Left 2012   left breast wide excision, sentinel node, MammoSite  . BREAST SURGERY Right 08/06/2014   Wide excision/sentinel node biopsy, T1a, N0 triple negative, MammoSite  . CESAREAN SECTION  1988  . COLONOSCOPY  October 17, 2009   Dr. Dionne Milo, normal study.  Marland Kitchen KIDNEY STONE SURGERY  2003   stones removed by Dr. Quillian Quince- stent placed/removed  . PORTACATH PLACEMENT Left 11/09/2014   Procedure: INSERTION PORT-A-CATH;  Surgeon: Robert Bellow, MD;  Location: [facility] ORS;  Service: General;  Laterality: Left;  . SENTINEL NODE BIOPSY Right 08/01/2014   Procedure: SENTINEL NODE BIOPSY;  Surgeon: Robert Bellow, MD;  Location: [facility] ORS;  Service: General;  Laterality: Right;  . TUBAL LIGATION      FAMILY HISTORY :   Family History  Problem Relation Age of Onset  . Ovarian cancer Mother   . Diabetes Mother   . Diabetes Father   . Prostate cancer Father        dx in his 38s  . Hypertension Brother   . Cancer Maternal Grandmother        bone  . Cancer Maternal Grandfather        lung  . Cancer Paternal Grandmother        lung  . Asthma Paternal Grandfather   . Hypertension Sister   . Prostate cancer Brother 31  . AAA (abdominal aortic aneurysm) Maternal Uncle     SOCIAL HISTORY:   Social History   Tobacco Use  . Smoking status: Never Smoker  . Smokeless tobacco: Never Used  Substance Use Topics  . Alcohol use: Yes    Comment: occasionally  . Drug use: No     ALLERGIES:  is allergic to cayenne pepper [cayenne]; shellfish allergy; and tape.  MEDICATIONS:  Current Outpatient Medications  Medication Sig Dispense Refill  . albuterol (PROVENTIL HFA;VENTOLIN HFA) 108 (90 Base) MCG/ACT inhaler Inhale 2 puffs into the lungs every 6 (six) hours as needed for wheezing or shortness of breath. 1 Inhaler 3  . aspirin 81 MG tablet Take 81 mg by mouth daily.    . bisacodyl (DULCOLAX) 5 MG EC tablet Take 5 mg by mouth daily as needed for moderate constipation.    . Cholecalciferol (VITAMIN D3) 1000 UNITS CAPS Take 1 capsule by mouth daily.     Marland Kitchen esomeprazole (NEXIUM) 40 MG capsule Take 40 mg by mouth 2 (two) times daily as needed (acid reflux).     Marland Kitchen lisinopril (PRINIVIL,ZESTRIL) 10 MG tablet Take 1 tablet (10 mg total) by mouth daily. 90 tablet 2  .  loratadine (CLARITIN) 10 MG tablet Take 10 mg by mouth daily. Reported on 04/13/2015    . meclizine (ANTIVERT) 12.5 MG tablet Take 1 tablet (12.5 mg total) by mouth 3 (three) times daily as needed for dizziness. 40 tablet 0  . polyethylene glycol (MIRALAX / GLYCOLAX) packet Take 17 g by mouth every other day.     No current facility-administered medications for this visit.    Facility-Administered Medications Ordered in Other Visits  Medication Dose Route Frequency Provider Last Rate Last Dose  . sodium chloride 0.9 % injection 10 mL  10 mL Intravenous PRN Leia Alf, MD   10 mL at 11/10/14 1138    PHYSICAL EXAMINATION: ECOG PERFORMANCE STATUS: 1 - Symptomatic but completely ambulatory  BP 137/81   Pulse 82   Temp (!) 96.9 F (36.1 C)   Wt 216 lb 4.8 oz (98.1 kg)   LMP  (LMP Unknown)   BMI 35.99 kg/m   Filed Weights   08/26/18 0845  Weight: 216 lb 4.8 oz (98.1 kg)    Physical Exam  Constitutional: She is oriented to person, place, and time and well-developed, well-nourished, and in no distress.  HENT:  Head: Normocephalic and atraumatic.  Mouth/Throat: Oropharynx is clear and moist. No  oropharyngeal exudate.  Eyes: Pupils are equal, round, and reactive to light.  Neck: Normal range of motion. Neck supple.  Cardiovascular: Normal rate and regular rhythm.  Pulmonary/Chest: No respiratory distress. She has no wheezes.  Abdominal: Soft. Bowel sounds are normal. She exhibits no distension and no mass. There is no abdominal tenderness. There is no rebound and no guarding.  Musculoskeletal: Normal range of motion.        General: No tenderness or edema.  Neurological: She is alert and oriented to person, place, and time.  Skin: Skin is warm.  Psychiatric: Affect normal.     LABORATORY DATA:  I have reviewed the data as listed    Component Value Date/Time   NA 137 08/26/2018 0830   NA 139 09/14/2017 1048   NA 140 07/16/2011 2130   K 3.5 08/26/2018 0830   K 3.7 07/16/2011 2130   CL 103 08/26/2018 0830   CL 106 07/16/2011 2130   CO2 25 08/26/2018 0830   CO2 27 07/16/2011 2130   GLUCOSE 174 (H) 08/26/2018 0830   GLUCOSE 93 07/16/2011 2130   BUN 19 08/26/2018 0830   BUN 14 09/14/2017 1048   BUN 16 07/16/2011 2130   CREATININE 0.90 08/26/2018 0830   CREATININE 0.95 07/16/2011 2130   CALCIUM 9.1 08/26/2018 0830   CALCIUM 9.0 07/16/2011 2130   PROT 8.0 08/26/2018 0830   PROT 7.8 09/14/2017 1048   ALBUMIN 4.0 08/26/2018 0830   ALBUMIN 4.2 09/14/2017 1048   AST 17 08/26/2018 0830   ALT 14 08/26/2018 0830   ALKPHOS 77 08/26/2018 0830   BILITOT 0.4 08/26/2018 0830   BILITOT 0.5 09/14/2017 1048   GFRNONAA >60 08/26/2018 0830   GFRNONAA >60 07/16/2011 2130   GFRAA >60 08/26/2018 0830   GFRAA >60 07/16/2011 2130    No results found for: SPEP, UPEP  Lab Results  Component Value Date   WBC 7.7 08/26/2018   NEUTROABS 5.0 08/26/2018   HGB 10.0 (L) 08/26/2018   HCT 30.2 (L) 08/26/2018   MCV 96.8 08/26/2018   PLT 204 08/26/2018      Chemistry      Component Value Date/Time   NA 137 08/26/2018 0830   NA 139 09/14/2017 1048   NA  140 07/16/2011 2130   K 3.5  08/26/2018 0830   K 3.7 07/16/2011 2130   CL 103 08/26/2018 0830   CL 106 07/16/2011 2130   CO2 25 08/26/2018 0830   CO2 27 07/16/2011 2130   BUN 19 08/26/2018 0830   BUN 14 09/14/2017 1048   BUN 16 07/16/2011 2130   CREATININE 0.90 08/26/2018 0830   CREATININE 0.95 07/16/2011 2130      Component Value Date/Time   CALCIUM 9.1 08/26/2018 0830   CALCIUM 9.0 07/16/2011 2130   ALKPHOS 77 08/26/2018 0830   AST 17 08/26/2018 0830   ALT 14 08/26/2018 0830   BILITOT 0.4 08/26/2018 0830   BILITOT 0.5 09/14/2017 1048         ASSESSMENT & PLAN:   Malignant neoplasm of overlapping sites of right breast in female, estrogen receptor negative ([place]) # 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  Clinically no evidence of recurrence; April 2020-  mammogram normal.  Continue surveillance.  Stable.  # Peripheral neuropathy- sec to chemo. grade 1-2 stable  # chronic mild anemia-unclear etiology.  Hb 10-stable  #Genetic counseling-patient is BRCA1 and 2- negative.  However, given history of bilateral breast cancer/ triple negative breast cancer-s/p genetic counseling.  Reviewed genetics note.  Awaiting genetic testing.  # MGUS- 1gm/dl; reviewed natural history of MGUS.  Will repeat again in 6 months  # DISPOSITION: # follow up in 6 months-MD Reva Bores- cbc/cmp/ca-27-29/MM panel;K-L panel-Dr.B           Cammie Sickle, MD 08/26/2018 6:38 PM

## 2018-08-27 LAB — CANCER ANTIGEN 27.29: CA 27.29: 15 U/mL (ref 0.0–38.6)

## 2018-09-02 ENCOUNTER — Encounter: Payer: Self-pay | Admitting: Internal Medicine

## 2018-09-07 ENCOUNTER — Encounter: Payer: Self-pay | Admitting: Genetic Counselor

## 2018-09-07 ENCOUNTER — Telehealth: Payer: Self-pay | Admitting: Genetic Counselor

## 2018-09-07 DIAGNOSIS — Z1379 Encounter for other screening for genetic and chromosomal anomalies: Secondary | ICD-10-CM | POA: Insufficient documentation

## 2018-09-07 NOTE — Telephone Encounter (Signed)
LM on VM that results were back and to please call. ?

## 2018-09-08 ENCOUNTER — Ambulatory Visit: Payer: Self-pay | Admitting: Genetic Counselor

## 2018-09-08 DIAGNOSIS — Z1379 Encounter for other screening for genetic and chromosomal anomalies: Secondary | ICD-10-CM

## 2018-09-08 NOTE — Progress Notes (Signed)
HPI:  Adrienne [name] was previously seen in the [place] clinic due to a personal and family history of cancer and concerns regarding a hereditary predisposition to cancer. Please refer to our prior cancer genetics clinic note for more information regarding our discussion, assessment and recommendations, at the time. Adrienne [name] recent genetic test results were disclosed to her, as were recommendations warranted by these results. These results and recommendations are discussed in more detail below.  CANCER HISTORY:  Oncology History Overview Note  # MAY 2016- RIGHT BREAST CANCER- Triple negative [T=0.5cm;G-3; margins-Neg]; Mammaprint- 0.814/molecular basal type [5 year risk- 22%; 10 year risk-29%] AC q3 W- taxol q w x 8/ 12 [sec to PN; finished Feb 2017].   # 2012 LEFT BREAST CA STAGE I s/p Lumpec [T1 (0.7CM);Triple Neg; G-3]; s/p mammosite RT; No adj chemo  #  Mild Anemia/M-protein 0.8gm/dl [sep 2016]  # BRCA- 1&2 NEGATIVE [july 2016]  # PN s/p acupuncture  --------------------------------------------------     DIAGNOSIS: breast cancer  STAGE:   I      ;GOALS: cure  CURRENT/MOST RECENT THERAPY: surveillaince     Malignant neoplasm of overlapping sites of right breast in female, estrogen receptor negative ([place])  08/31/2018 Genetic Testing   Two VUS identified on the CustomNext-Cancer+RNAinsight.  CFTR p.R450I and CTNNA1 p.L402S VUS.  The CustomNext-Expanded gene panel offered by [facility] and includes sequencing and rearrangement analysis for the following 81 genes: AIP, ALK, APC*, ATM*, AXIN2, BAP1, BARD1, BLM, BMPR1A, BRCA1*, BRCA2*, BRIP1*, CDC73, CDH1*, CDK4, CDKN1B, CDKN2A, CHEK2*, CTNNA1, DICER1, FANCC, FH, FLCN, GALNT12, HOXB13, KIT, MAX, MEN1, MET, MLH1*, MRE11A, MSH2*, MSH6*, MUTYH*, NBN, NF1*, NF2, NTHL1, PALB2*, PDGFRA, PHOX2B, PMS2*, POLD1, POLE, POT1, PRKAR1A, PTCH1, PTEN*, RAD50, RAD51C*, RAD51D*, RB1, RET, SDHA, SDHAF2, SDHB, SDHC, SDHD, SMAD4, SMARCA4,  SMARCB1, SMARCE1, STK11, SUFU, TMEM127, TP53*, TSC1, TSC2, VHL and XRCC2 (sequencing and deletion/duplication); CASR, CFTR, CPA1, CTRC, EGFR, MITF, PRSS1 and SPINK1 (sequencing only); EPCAM and GREM1 (deletion/duplication only). DNA and RNA analyses performed for * genes. The report date is September 01, 2018.     FAMILY HISTORY:  We obtained a detailed, 4-generation family history.  Significant diagnoses are listed below: Family History  Problem Relation Age of Onset   Ovarian cancer Mother    Diabetes Mother    Diabetes Father    Prostate cancer Father        dx in his 61s   Hypertension Brother    Cancer Maternal Grandmother        bone   Cancer Maternal Grandfather        lung   Cancer Paternal Grandmother        lung   Asthma Paternal Grandfather    Hypertension Sister    Prostate cancer Brother 64   AAA (abdominal aortic aneurysm) Maternal Uncle     The patient has two sons and a daughter who are all cancer free.  She has two brothers and two sisters.  One brother was diagnosed with prostate cancer at age 39.  The patient's father is deceased and her mother is living.  The patient's mother was diagnosed with ovarian cancer in her 68's.  She had two brothers and a sister.  One brother died of an AAA.  The remainder of her siblings are cancer free.  Her mother had bone cancer and her father had lung cancer.  The mother's maternal half sister had breast cancer.  The patient's father had prostate cancer in his 90's.  He had  a brother and sister.  The sister had an unknown cancer in her 69's.  His mother had lung cancer and his father died of Asthma.  Adrienne [name] is unaware of previous family history of genetic testing for hereditary cancer risks. Patient's maternal ancestors are of African American descent, and paternal ancestors are of African American descent. There is no reported Ashkenazi Jewish ancestry. There is no known consanguinity.    GENETIC TEST RESULTS:  Genetic testing reported out on September 01, 2018 through the CustomNext-Cancer+RNAinsight cancer panel found no pathogenic mutations. The CustomNext-Cancer gene panel offered by [facility] and includes sequencing and rearrangement analysis for the following 81 genes: AIP, ALK, APC*, ATM*, AXIN2, BAP1, BARD1, BLM, BMPR1A, BRCA1*, BRCA2*, BRIP1*, CDC73, CDH1*, CDK4, CDKN1B, CDKN2A, CHEK2*, CTNNA1, DICER1, FANCC, FH, FLCN, GALNT12, HOXB13, KIT, MAX, MEN1, MET, MLH1*, MRE11A, MSH2*, MSH6*, MUTYH*, NBN, NF1*, NF2, NTHL1, PALB2*, PDGFRA, PHOX2B, PMS2*, POLD1, POLE, POT1, PRKAR1A, PTCH1, PTEN*, RAD50, RAD51C*, RAD51D*, RB1, RET, SDHA, SDHAF2, SDHB, SDHC, SDHD, SMAD4, SMARCA4, SMARCB1, SMARCE1, STK11, SUFU, TMEM127, TP53*, TSC1, TSC2, VHL and XRCC2 (sequencing and deletion/duplication); CASR, CFTR, CPA1, CTRC, EGFR, MITF, PRSS1 and SPINK1 (sequencing only); EPCAM and GREM1 (deletion/duplication only). DNA and RNA analyses performed for * genes. . The test report has been scanned into EPIC and is located under the Molecular Pathology section of the Results Review tab.  A portion of the result report is included below for reference.     We discussed with Adrienne [name] that because current genetic testing is not perfect, it is possible there may be a gene mutation in one of these genes that current testing cannot detect, but that chance is small.  We also discussed, that there could be another gene that has not yet been discovered, or that we have not yet tested, that is responsible for the cancer diagnoses in the family. It is also possible there is a hereditary cause for the cancer in the family that Adrienne [name] did not inherit and therefore was not identified in her testing.  Therefore, it is important to remain in touch with cancer genetics in the future so that we can continue to offer Adrienne [name] the most up to date genetic testing.   Genetic testing did identify two Variants of uncertain significance (VUS) - one in the  CFTR gene called p.R450I, and a second in the CTNNA1 gene called p.L402S.  At this time, it is unknown if these variants are associated with increased cancer risk or if they are normal findings, but most variants such as these get reclassified to being inconsequential. They should not be used to make medical management decisions. With time, we suspect the lab will determine the significance of these variants, if any. If we do learn more about them, we will try to contact Adrienne [name] to discuss it further. However, it is important to stay in touch with [place] periodically and keep the address and phone number up to date.  ADDITIONAL GENETIC TESTING: We discussed with Adrienne [name] that her genetic testing was fairly extensive.  If there are genes identified to increase cancer risk that can be analyzed in the future, we would be happy to discuss and coordinate this testing at that time.    CANCER SCREENING RECOMMENDATIONS: Adrienne [name] test result is considered negative (normal).  This means that we have not identified a hereditary cause for her personal and family history of cancer at this time. Most cancers happen by chance and this negative test suggests that her  cancer may fall into this category.    While reassuring, this does not definitively rule out a hereditary predisposition to cancer. It is still possible that there could be genetic mutations that are undetectable by current technology. There could be genetic mutations in genes that have not been tested or identified to increase cancer risk.  Therefore, it is recommended she continue to follow the cancer management and screening guidelines provided by her oncology and primary healthcare provider.   An individual's cancer risk and medical management are not determined by genetic test results alone. Overall cancer risk assessment incorporates additional factors, including personal medical history, family history, and any available genetic information that may  result in a personalized plan for cancer prevention and surveillance  RECOMMENDATIONS FOR FAMILY MEMBERS:  Individuals in this family might be at some increased risk of developing cancer, over the general population risk, simply due to the family history of cancer.  We recommended women in this family have a yearly mammogram beginning at age 34, or 9 years younger than the earliest onset of cancer, an annual clinical breast exam, and perform monthly breast self-exams. Women in this family should also have a gynecological exam as recommended by their primary provider. All family members should have a colonoscopy by age 66.  FOLLOW-UP: Lastly, we discussed with Adrienne [name] that cancer genetics is a rapidly advancing field and it is possible that new genetic tests will be appropriate for her and/or her family members in the future. We encouraged her to remain in contact with cancer genetics on an annual basis so we can update her personal and family histories and let her know of advances in cancer genetics that may benefit this family.   Our contact number was provided. Adrienne [name] questions were answered to her satisfaction, and she knows she is welcome to call us at anytime with additional questions or concerns.   Roma Kayser, MS, [facility] Certified Genetic Counselor [email or web address]

## 2018-09-08 NOTE — Telephone Encounter (Signed)
Revealed negative genetic testing.  Discussed that we do not know why she has breast cancer or why there is cancer in the family. It could be due to a different gene that we are not testing, or maybe our current technology may not be able to pick something up.  It will be important for her to keep in contact with genetics to keep up with whether additional testing may be needed. 

## 2018-09-20 ENCOUNTER — Other Ambulatory Visit: Payer: Self-pay

## 2018-09-20 ENCOUNTER — Encounter: Payer: Self-pay | Admitting: Internal Medicine

## 2018-09-20 ENCOUNTER — Encounter: Payer: Self-pay | Admitting: Nurse Practitioner

## 2018-09-20 ENCOUNTER — Ambulatory Visit (INDEPENDENT_AMBULATORY_CARE_PROVIDER_SITE_OTHER): Payer: Managed Care, Other (non HMO) | Admitting: Nurse Practitioner

## 2018-09-20 VITALS — BP 126/82 | HR 68 | Temp 98.8°F | Ht 64.5 in | Wt 210.0 lb

## 2018-09-20 DIAGNOSIS — K5909 Other constipation: Secondary | ICD-10-CM

## 2018-09-20 DIAGNOSIS — Z171 Estrogen receptor negative status [ER-]: Secondary | ICD-10-CM

## 2018-09-20 DIAGNOSIS — Z0000 Encounter for general adult medical examination without abnormal findings: Secondary | ICD-10-CM

## 2018-09-20 DIAGNOSIS — C50811 Malignant neoplasm of overlapping sites of right female breast: Secondary | ICD-10-CM | POA: Diagnosis not present

## 2018-09-20 DIAGNOSIS — R7301 Impaired fasting glucose: Secondary | ICD-10-CM

## 2018-09-20 DIAGNOSIS — J4520 Mild intermittent asthma, uncomplicated: Secondary | ICD-10-CM

## 2018-09-20 DIAGNOSIS — G620 Drug-induced polyneuropathy: Secondary | ICD-10-CM | POA: Diagnosis not present

## 2018-09-20 DIAGNOSIS — Z13220 Encounter for screening for lipoid disorders: Secondary | ICD-10-CM

## 2018-09-20 DIAGNOSIS — T451X5A Adverse effect of antineoplastic and immunosuppressive drugs, initial encounter: Secondary | ICD-10-CM

## 2018-09-20 DIAGNOSIS — I10 Essential (primary) hypertension: Secondary | ICD-10-CM | POA: Diagnosis not present

## 2018-09-20 NOTE — Assessment & Plan Note (Signed)
Chronic, stable without medication at this time.  Has had side effects with Lyrica and Gabapentin, extreme fatigue. °

## 2018-09-20 NOTE — Patient Instructions (Signed)

## 2018-09-20 NOTE — Assessment & Plan Note (Signed)
Continue to collaborate with oncology team. 

## 2018-09-20 NOTE — Assessment & Plan Note (Signed)
Chronic and stable °Continue current medication regimen °

## 2018-09-20 NOTE — Assessment & Plan Note (Signed)
Chronic, stable with minimal use of inhaler.  Continue current medication regimen.

## 2018-09-20 NOTE — Progress Notes (Signed)
BP 126/82   Pulse 68   Temp 98.8 F (37.1 C) (Oral)   Ht 5' 4.5" (1.638 m)   Wt 210 lb (95.3 kg)   LMP  (LMP Unknown)   SpO2 100%   BMI 35.49 kg/m    Subjective:    Patient ID: Adrienne [name], female    DOB: January 17, 1961, 58 y.o.   MRN: [id number]  HPI: ROSHANA SHUFFIELD is a 58 y.o. female presenting on 09/20/2018 for comprehensive medical examination. Current medical complaints include:none  She currently lives with: significant other Menopausal Symptoms: no   Recent breast exam performed by Dr. Bary Castilla 07/27/18.  HYPERTENSION Currently taking Lisinopril and ASA. Hypertension status: controlled  Satisfied with current treatment? yes Duration of hypertension: chronic BP monitoring frequency:  daily BP range: 120/80's at home BP medication side effects:  no Medication compliance: good compliance Aspirin: yes Recurrent headaches: no Visual changes: no Palpitations: no Dyspnea: no Chest pain: no Lower extremity edema: trace on both sides Dizzy/lightheaded: no   The 10-year ASCVD risk score Mikey Bussing DC Jr., et al., 2013) is: 6.1%   Values used to calculate the score:     Age: 84 years     Sex: Female     Is Non-Hispanic African American: Yes     Diabetic: No     Tobacco smoker: No     Systolic Blood Pressure: 572 mmHg     Is BP treated: Yes     HDL Cholesterol: 66 mg/dL     Total Cholesterol: 210 mg/dL   ASTHMA Current medications include Albuterol and Claritin. Asthma status: stable Satisfied with current treatment?: yes Albuterol/rescue inhaler frequency:  Dyspnea frequency: none Wheezing frequency: none Cough frequency: none Nocturnal symptom frequency: none Limitation of activity: no Current upper respiratory symptoms: no Triggers: seasonal Home peak flows: none Last Spirometry: none Failed/intolerant to following asthma meds: none Asthma meds in past: none Aerochamber/spacer use: no Visits to ER or Urgent Care in past year: no Pneumovax: Up to Date  Influenza: Up to Date  Depression Screen done today and results listed below:  Depression screen [facility] 2/9 09/20/2018 09/14/2017 11/03/2016 08/29/2016 08/27/2015  Decreased Interest 0 3 0 0 0  Down, Depressed, Hopeless 0 2 0 0 0  PHQ - 2 Score 0 5 0 0 0  Altered sleeping 0 1 - - -  Tired, decreased energy 0 2 - - -  Change in appetite 0 0 - - -  Feeling bad or failure about yourself  0 2 - - -  Trouble concentrating 0 0 - - -  Moving slowly or fidgety/restless 0 1 - - -  Suicidal thoughts 0 1 - - -  PHQ-9 Score 0 12 - - -  Difficult doing work/chores Not difficult at all - - - -    The patient has not had a history of falls. I did not complete a risk assessment for falls. A plan of care for falls was not documented.   Past Medical History:  Past Medical History:  Diagnosis Date  . Anemia   . Asthma   . BRCA negative 09/22/2014  . Breast cancer ([place]) 2012   LT LUMPECTOMY  . Breast cancer ([place]) 2016   RT LUMPECTOMY  . Breast cyst 2010   cyst- drained  . Breast screening, unspecified   . Constipation   . Dry eyes 2012  . Family history of breast cancer   . Family history of ovarian cancer   . Family history of  prostate cancer   . GERD (gastroesophageal reflux disease)   . Hematuria   . Kidney stone   . Lump or mass in breast   . Malignant neoplasm of breast (female), unspecified site 07/08/2014   Right breast, 5 mm, T1a,N0, triple negative; high risk for recurrence on Mammoprint.  . Malignant neoplasm of upper-outer quadrant of female breast ([place]) 05/08/2010   Left breast: T1b, N0, M); triple negative, DCIS present. No chemotherapy. MammoSite radiation.  . Neuropathy    pt states in fingertips and feet  . Obesity, unspecified   . Pancreatic lesion   . Personal history of chemotherapy 2016   BREAST CA  . Personal history of malignant neoplasm of breast 2012   has been treated with left breast wide excision and radiation therapy; Patient has DCIS as well as a 7 mm  infiltrating mammary carcinoma triple negative removed on 05-08-10  . Personal history of radiation therapy 2012   BREAST CA - MAMMOSITE  . Personal history of radiation therapy 2016   BREAST CA - MAMMOSITE  . Screening for obesity   . Seasonal allergies   . Special screening for malignant neoplasms, colon   . Unspecified essential hypertension     Surgical History:  Past Surgical History:  Procedure Laterality Date  . BREAST BIOPSY    . BREAST CYST ASPIRATION Left 2010  . BREAST LUMPECTOMY Left 2012   BREAST CA  . BREAST LUMPECTOMY Right 2016   INVASIVE MAMMARY CARCINOMA  . BREAST LUMPECTOMY WITH AXILLARY LYMPH NODE BIOPSY Right 08/01/2014   Procedure: BREAST LUMPECTOMY WITH AXILLARY LYMPH NODE BIOPSY;  Surgeon: Robert Bellow, MD;  Location: [facility] ORS;  Service: General;  Laterality: Right;  . BREAST MAMMOSITE  April 2012   mammosite placement and removal   . BREAST SURGERY Left 2012   left breast wide excision, sentinel node, MammoSite  . BREAST SURGERY Right 08/06/2014   Wide excision/sentinel node biopsy, T1a, N0 triple negative, MammoSite  . CESAREAN SECTION  1988  . COLONOSCOPY  October 17, 2009   Dr. Dionne Milo, normal study.  Marland Kitchen KIDNEY STONE SURGERY  2003   stones removed by Dr. Quillian Quince- stent placed/removed  . PORTACATH PLACEMENT Left 11/09/2014   Procedure: INSERTION PORT-A-CATH;  Surgeon: Robert Bellow, MD;  Location: [facility] ORS;  Service: General;  Laterality: Left;  . SENTINEL NODE BIOPSY Right 08/01/2014   Procedure: SENTINEL NODE BIOPSY;  Surgeon: Robert Bellow, MD;  Location: [facility] ORS;  Service: General;  Laterality: Right;  . TUBAL LIGATION      Medications:  Current Outpatient Medications on File Prior to Visit  Medication Sig  . albuterol (PROVENTIL HFA;VENTOLIN HFA) 108 (90 Base) MCG/ACT inhaler Inhale 2 puffs into the lungs every 6 (six) hours as needed for wheezing or shortness of breath.  Marland Kitchen aspirin 81 MG tablet Take 81 mg by mouth daily.  . bisacodyl  (DULCOLAX) 5 MG EC tablet Take 5 mg by mouth daily as needed for moderate constipation.  . Cholecalciferol (VITAMIN D3) 1000 UNITS CAPS Take 1 capsule by mouth daily.   Marland Kitchen esomeprazole (NEXIUM) 40 MG capsule Take 40 mg by mouth 2 (two) times daily as needed (acid reflux).   Marland Kitchen lisinopril (PRINIVIL,ZESTRIL) 10 MG tablet Take 1 tablet (10 mg total) by mouth daily.  Marland Kitchen loratadine (CLARITIN) 10 MG tablet Take 10 mg by mouth daily. Reported on 04/13/2015  . meclizine (ANTIVERT) 12.5 MG tablet Take 1 tablet (12.5 mg total) by mouth 3 (three) times daily as needed  for dizziness.  . polyethylene glycol (MIRALAX / GLYCOLAX) packet Take 17 g by mouth every other day.   Current Facility-Administered Medications on File Prior to Visit  Medication  . sodium chloride 0.9 % injection 10 mL    Allergies:  Allergies  Allergen Reactions  . Cayenne Pepper [Cayenne] Shortness Of Breath  . Shellfish Allergy Other (See Comments)    Hives and swelling on ears and feet  . Tape Rash    Surgical tape from breast biopsy    Social History:  Social History   Socioeconomic History  . Marital status: Single    Spouse name: Not on file  . Number of children: Not on file  . Years of education: Not on file  . Highest education level: Not on file  Occupational History  . Not on file  Social Needs  . Financial resource strain: Not on file  . Food insecurity    Worry: Not on file    Inability: Not on file  . Transportation needs    Medical: Not on file    Non-medical: Not on file  Tobacco Use  . Smoking status: Never Smoker  . Smokeless tobacco: Never Used  Substance and Sexual Activity  . Alcohol use: Yes    Comment: occasionally  . Drug use: No  . Sexual activity: Yes  Lifestyle  . Physical activity    Days per week: Not on file    Minutes per session: Not on file  . Stress: Not on file  Relationships  . Social Herbalist on phone: Not on file    Gets together: Not on file    Attends  religious service: Not on file    Active member of club or organization: Not on file    Attends meetings of clubs or organizations: Not on file    Relationship status: Not on file  . Intimate partner violence    Fear of current or ex partner: Not on file    Emotionally abused: Not on file    Physically abused: Not on file    Forced sexual activity: Not on file  Other Topics Concern  . Not on file  Social History Narrative  . Not on file   Social History   Tobacco Use  Smoking Status Never Smoker  Smokeless Tobacco Never Used   Social History   Substance and Sexual Activity  Alcohol Use Yes   Comment: occasionally    Family History:  Family History  Problem Relation Age of Onset  . Ovarian cancer Mother   . Diabetes Mother   . Diabetes Father   . Prostate cancer Father        dx in his 45s  . Hypertension Brother   . Cancer Maternal Grandmother        bone  . Cancer Maternal Grandfather        lung  . Cancer Paternal Grandmother        lung  . Asthma Paternal Grandfather   . Hypertension Sister   . Prostate cancer Brother 33  . AAA (abdominal aortic aneurysm) Maternal Uncle     Past medical history, surgical history, medications, allergies, family history and social history reviewed with patient today and changes made to appropriate areas of the chart.   Review of Systems - negative All other ROS negative except what is listed above and in the HPI.      Objective:    BP 126/82   Pulse 68  Temp 98.8 F (37.1 C) (Oral)   Ht 5' 4.5" (1.638 m)   Wt 210 lb (95.3 kg)   LMP  (LMP Unknown)   SpO2 100%   BMI 35.49 kg/m   Wt Readings from Last 3 Encounters:  09/20/18 210 lb (95.3 kg)  08/26/18 216 lb 4.8 oz (98.1 kg)  07/27/18 211 lb (95.7 kg)    Physical Exam Vitals signs and nursing note reviewed.  Constitutional:      General: She is awake. She is not in acute distress.    Appearance: She is well-developed. She is not ill-appearing.  HENT:      Head: Normocephalic.     Right Ear: Hearing, tympanic membrane, ear canal and external ear normal.     Left Ear: Hearing, tympanic membrane, ear canal and external ear normal.     Nose: Nose normal.     Right Sinus: No maxillary sinus tenderness or frontal sinus tenderness.     Left Sinus: No maxillary sinus tenderness or frontal sinus tenderness.     Mouth/Throat:     Mouth: Mucous membranes are moist.     Pharynx: Oropharynx is clear. Uvula midline.  Eyes:     General: Lids are normal.        Right eye: No discharge.        Left eye: No discharge.     Extraocular Movements: Extraocular movements intact.     Conjunctiva/sclera: Conjunctivae normal.     Pupils: Pupils are equal, round, and reactive to light.     Visual Fields: Right eye visual fields normal and left eye visual fields normal.  Neck:     Musculoskeletal: Normal range of motion and neck supple.     Thyroid: No thyromegaly.     Vascular: No carotid bruit or JVD.  Cardiovascular:     Rate and Rhythm: Normal rate and regular rhythm.     Heart sounds: Normal heart sounds. No murmur. No gallop.   Pulmonary:     Effort: Pulmonary effort is normal.     Breath sounds: Normal breath sounds.  Abdominal:     General: Bowel sounds are normal.     Palpations: Abdomen is soft. There is no hepatomegaly or splenomegaly.  Musculoskeletal:     Right lower leg: No edema.     Left lower leg: No edema.  Lymphadenopathy:     Cervical: No cervical adenopathy.  Skin:    General: Skin is warm and dry.  Neurological:     Mental Status: She is alert and oriented to person, place, and time.     Cranial Nerves: Cranial nerves are intact.     Gait: Gait is intact.  Psychiatric:        Attention and Perception: Attention normal.        Mood and Affect: Mood normal.        Speech: Speech normal.        Behavior: Behavior normal. Behavior is cooperative.        Thought Content: Thought content normal.        Cognition and Memory:  Cognition normal.        Judgment: Judgment normal.     Results for orders placed or performed in visit on 08/26/18  CBC with Differential/Platelet  Result Value Ref Range   WBC 7.7 4.0 - 10.5 K/uL   RBC 3.12 (L) 3.87 - 5.11 MIL/uL   Hemoglobin 10.0 (L) 12.0 - 15.0 g/dL   HCT 30.2 (L) 36.0 - 46.0 %  MCV 96.8 80.0 - 100.0 fL   MCH 32.1 26.0 - 34.0 pg   MCHC 33.1 30.0 - 36.0 g/dL   RDW 14.3 11.5 - 15.5 %   Platelets 204 150 - 400 K/uL   nRBC 0.0 0.0 - 0.2 %   Neutrophils Relative % 66 %   Neutro Abs 5.0 1.7 - 7.7 K/uL   Lymphocytes Relative 24 %   Lymphs Abs 1.8 0.7 - 4.0 K/uL   Monocytes Relative 7 %   Monocytes Absolute 0.6 0.1 - 1.0 K/uL   Eosinophils Relative 2 %   Eosinophils Absolute 0.2 0.0 - 0.5 K/uL   Basophils Relative 1 %   Basophils Absolute 0.0 0.0 - 0.1 K/uL   Immature Granulocytes 0 %   Abs Immature Granulocytes 0.03 0.00 - 0.07 K/uL  Cancer antigen 27.29  Result Value Ref Range   CA 27.29 15.0 0.0 - 38.6 U/mL  Comprehensive metabolic panel  Result Value Ref Range   Sodium 137 135 - 145 mmol/L   Potassium 3.5 3.5 - 5.1 mmol/L   Chloride 103 98 - 111 mmol/L   CO2 25 22 - 32 mmol/L   Glucose, Bld 174 (H) 70 - 99 mg/dL   BUN 19 6 - 20 mg/dL   Creatinine, Ser 0.90 0.44 - 1.00 mg/dL   Calcium 9.1 8.9 - 10.3 mg/dL   Total Protein 8.0 6.5 - 8.1 g/dL   Albumin 4.0 3.5 - 5.0 g/dL   AST 17 15 - 41 U/L   ALT 14 0 - 44 U/L   Alkaline Phosphatase 77 38 - 126 U/L   Total Bilirubin 0.4 0.3 - 1.2 mg/dL   GFR calc non Af Amer >60 >60 mL/min   GFR calc Af Amer >60 >60 mL/min   Anion gap 9 5 - 15      Assessment & Plan:   Problem List Items Addressed This Visit      Cardiovascular and Mediastinum   Hypertension    Chronic, stable with BP at goal.  Continue current medication regimen.        Relevant Orders   CBC with Differential/Platelet   Comprehensive metabolic panel     Respiratory   Asthma    Chronic, stable with minimal use of inhaler.  Continue  current medication regimen.          Digestive   Chronic constipation    Chronic and stable.  Continue current medication regimen.        Nervous and Auditory   Peripheral neuropathy due to chemotherapy (HCC)    Chronic, stable without medication at this time.  Has had side effects with Lyrica and Gabapentin, extreme fatigue.        Other   Malignant neoplasm of overlapping sites of right breast in female, estrogen receptor negative ([place])    Continue to collaborate with oncology team.       Other Visit Diagnoses    Encounter for annual physical exam    -  Primary   Relevant Orders   TSH   VITAMIN D 25 Hydroxy (Vit-D Deficiency, Fractures)   IFG (impaired fasting glucose)       check A1C today   Relevant Orders   HgB A1c   Screening for cholesterol level       Check lipid panel   Relevant Orders   Lipid Panel w/o Chol/HDL Ratio       Follow up plan: Return in about 6 months (around 03/23/2019) for HTN and Asthma +  MGUS.   LABORATORY TESTING:  - Pap smear: up to date  IMMUNIZATIONS:   - Tdap: Tetanus vaccination status reviewed: last tetanus booster within 10 years. - Influenza: Up to date - Pneumovax: Up to date - Prevnar: Not applicable - HPV: Not applicable - Zostavax vaccine: going to see if okay with cancer providers  SCREENING: -Mammogram: Up to date  - Colonoscopy: Up to date  - Bone Density: Not applicable  -Hearing Test: Not applicable  -Spirometry: Not applicable   PATIENT COUNSELING:   Advised to take 1 mg of folate supplement per day if capable of pregnancy.   Sexuality: Discussed sexually transmitted diseases, partner selection, use of condoms, avoidance of unintended pregnancy  and contraceptive alternatives.   Advised to avoid cigarette smoking.  I discussed with the patient that most people either abstain from alcohol or drink within safe limits (<=14/week and <=4 drinks/occasion for males, <=7/weeks and <= 3 drinks/occasion for females)  and that the risk for alcohol disorders and other health effects rises proportionally with the number of drinks per week and how often a drinker exceeds daily limits.  Discussed cessation/primary prevention of drug use and availability of treatment for abuse.   Diet: Encouraged to adjust caloric intake to maintain  or achieve ideal body weight, to reduce intake of dietary saturated fat and total fat, to limit sodium intake by avoiding high sodium foods and not adding table salt, and to maintain adequate dietary potassium and calcium preferably from fresh fruits, vegetables, and low-fat dairy products.    stressed the importance of regular exercise  Injury prevention: Discussed safety belts, safety helmets, smoke detector, smoking near bedding or upholstery.   Dental health: Discussed importance of regular tooth brushing, flossing, and dental visits.    NEXT PREVENTATIVE PHYSICAL DUE IN 1 YEAR. Return in about 6 months (around 03/23/2019) for HTN and Asthma + MGUS.

## 2018-09-20 NOTE — Assessment & Plan Note (Signed)
Chronic, stable with BP at goal.  Continue current medication regimen.

## 2018-09-21 ENCOUNTER — Encounter: Payer: Self-pay | Admitting: Nurse Practitioner

## 2018-09-21 DIAGNOSIS — R7303 Prediabetes: Secondary | ICD-10-CM | POA: Insufficient documentation

## 2018-09-21 LAB — COMPREHENSIVE METABOLIC PANEL
ALT: 12 IU/L (ref 0–32)
AST: 13 IU/L (ref 0–40)
Albumin/Globulin Ratio: 1.4 (ref 1.2–2.2)
Albumin: 4.2 g/dL (ref 3.8–4.9)
Alkaline Phosphatase: 72 IU/L (ref 39–117)
BUN/Creatinine Ratio: 20 (ref 12–28)
BUN: 18 mg/dL (ref 8–27)
Bilirubin Total: 0.5 mg/dL (ref 0.0–1.2)
CO2: 22 mmol/L (ref 20–29)
Calcium: 9.5 mg/dL (ref 8.7–10.3)
Chloride: 105 mmol/L (ref 96–106)
Creatinine, Ser: 0.92 mg/dL (ref 0.57–1.00)
GFR calc Af Amer: 78 mL/min/{1.73_m2} (ref >59)
GFR calc non Af Amer: 68 mL/min/{1.73_m2} (ref >59)
Globulin, Total: 2.9 g/dL (ref 1.5–4.5)
Glucose: 104 mg/dL — ABNORMAL HIGH (ref 65–99)
Potassium: 3.9 mmol/L (ref 3.5–5.2)
Sodium: 140 mmol/L (ref 134–144)
Total Protein: 7.1 g/dL (ref 6.0–8.5)

## 2018-09-21 LAB — CBC WITH DIFFERENTIAL/PLATELET
Basophils Absolute: 0 10*3/uL (ref 0.0–0.2)
Basos: 1 %
EOS (ABSOLUTE): 0.1 10*3/uL (ref 0.0–0.4)
Eos: 2 %
Hematocrit: 32.9 % — ABNORMAL LOW (ref 34.0–46.6)
Hemoglobin: 10.7 g/dL — ABNORMAL LOW (ref 11.1–15.9)
Immature Grans (Abs): 0 10*3/uL (ref 0.0–0.1)
Immature Granulocytes: 0 %
Lymphocytes Absolute: 1.8 10*3/uL (ref 0.7–3.1)
Lymphs: 33 %
MCH: 32.5 pg (ref 26.6–33.0)
MCHC: 32.5 g/dL (ref 31.5–35.7)
MCV: 100 fL — ABNORMAL HIGH (ref 79–97)
Monocytes Absolute: 0.5 10*3/uL (ref 0.1–0.9)
Monocytes: 9 %
Neutrophils Absolute: 3.1 10*3/uL (ref 1.4–7.0)
Neutrophils: 55 %
Platelets: 193 10*3/uL (ref 150–450)
RBC: 3.29 x10E6/uL — ABNORMAL LOW (ref 3.77–5.28)
RDW: 13.6 % (ref 11.7–15.4)
WBC: 5.5 10*3/uL (ref 3.4–10.8)

## 2018-09-21 LAB — LIPID PANEL W/O CHOL/HDL RATIO
Cholesterol, Total: 192 mg/dL (ref 100–199)
HDL: 64 mg/dL (ref >39)
LDL Calculated: 115 mg/dL — ABNORMAL HIGH (ref 0–99)
Triglycerides: 64 mg/dL (ref 0–149)
VLDL Cholesterol Cal: 13 mg/dL (ref 5–40)

## 2018-09-21 LAB — HEMOGLOBIN A1C
Est. average glucose Bld gHb Est-mCnc: 120 mg/dL
Hgb A1c MFr Bld: 5.8 % — ABNORMAL HIGH (ref 4.8–5.6)

## 2018-09-21 LAB — VITAMIN D 25 HYDROXY (VIT D DEFICIENCY, FRACTURES): Vit D, 25-Hydroxy: 30 ng/mL (ref 30.0–100.0)

## 2018-09-21 LAB — TSH: TSH: 2.44 u[IU]/mL (ref 0.450–4.500)

## 2018-10-07 ENCOUNTER — Encounter: Payer: Self-pay | Admitting: General Surgery

## 2018-11-08 ENCOUNTER — Encounter: Payer: Self-pay | Admitting: Radiation Oncology

## 2018-11-08 ENCOUNTER — Ambulatory Visit
Admission: RE | Admit: 2018-11-08 | Discharge: 2018-11-08 | Disposition: A | Discharge status: Home / Self Care | Payer: Managed Care, Other (non HMO) | Source: Ambulatory Visit | Attending: Radiation Oncology | Admitting: Radiation Oncology

## 2018-11-08 ENCOUNTER — Other Ambulatory Visit: Payer: Self-pay

## 2018-11-08 VITALS — BP 140/67 | HR 62 | Temp 96.2°F | Resp 18 | Wt 211.2 lb

## 2018-11-08 DIAGNOSIS — Z171 Estrogen receptor negative status [ER-]: Secondary | ICD-10-CM

## 2018-11-08 DIAGNOSIS — Z923 Personal history of irradiation: Secondary | ICD-10-CM | POA: Diagnosis not present

## 2018-11-08 DIAGNOSIS — Z853 Personal history of malignant neoplasm of breast: Secondary | ICD-10-CM | POA: Insufficient documentation

## 2018-11-08 DIAGNOSIS — C50411 Malignant neoplasm of upper-outer quadrant of right female breast: Secondary | ICD-10-CM

## 2018-11-08 NOTE — Progress Notes (Signed)
Radiation Oncology Follow up Note  Name: Adrienne [name]   Date:   11/08/2018 MRN:  [id number] DOB: 18-Jun-1960    This 58 y.o. female presents to the clinic today for 4-year follow-up status post accelerated partial breast radiation to bilateral breast for invasive mammary carcinoma.  REFERRING PROVIDER: Kathrine Haddock, NP  HPI: Patient is a 58 year old female now at 4 years having completed accelerated partial breast radiation to both her breasts.  The right breast was treated approximately 4 years prior and has been 7 years since her left breast.  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  Both tumors were ER PR negative she is not on antiestrogen therapy.  She specifically denies breast tenderness cough or bone pain.  Last mammograms were back in April and showed BI-RADS 2 benign in both breasts.  COMPLICATIONS OF TREATMENT: none  FOLLOW UP COMPLIANCE: keeps appointments   PHYSICAL EXAM:  BP 140/67 (Patient Position: Sitting)   Pulse 62   Temp (!) 96.2 F (35.7 C) (Tympanic)   Resp 18   Wt 211 lb 3.2 oz (95.8 kg)   LMP  (LMP Unknown)   BMI 35.69 kg/m  Lungs are clear to A&P cardiac examination essentially unremarkable with regular rate and rhythm. No dominant mass or nodularity is noted in either breast in 2 positions examined. Incision is well-healed. No axillary or supraclavicular adenopathy is appreciated. Cosmetic result is excellent.  Well-developed well-nourished patient in NAD. HEENT reveals PERLA, EOMI, discs not visualized.  Oral cavity is clear. No oral mucosal lesions are identified. Neck is clear without evidence of cervical or supraclavicular adenopathy. Lungs are clear to A&P. Cardiac examination is essentially unremarkable with regular rate and rhythm without murmur rub or thrill. Abdomen is benign with no organomegaly or masses noted. Motor sensory and DTR levels are equal and symmetric in the upper and lower extremities. Cranial nerves II  through XII are grossly intact. Proprioception is intact. No peripheral adenopathy or edema is identified. No motor or sensory levels are noted. Crude visual fields are within normal range.  RADIOLOGY RESULTS: Bilateral mammograms reviewed compatible with above-stated findings  PLAN: At the present time patient is doing well with no evidence of disease.  I am pleased with her overall progress.  I have asked to see her back in 1 year for follow-up and then will discontinue follow-up care.  She is not on antiestrogen therapy based on the negative ER PR status of her tumor.  Patient knows to call with any concerns at any time.  I would like to take this opportunity to thank you for allowing me to participate in the care of your patient.Noreene Filbert, MD

## 2018-11-27 ENCOUNTER — Ambulatory Visit: Payer: Managed Care, Other (non HMO)

## 2018-11-29 ENCOUNTER — Ambulatory Visit (INDEPENDENT_AMBULATORY_CARE_PROVIDER_SITE_OTHER): Payer: Managed Care, Other (non HMO)

## 2018-11-29 DIAGNOSIS — Z23 Encounter for immunization: Secondary | ICD-10-CM | POA: Diagnosis not present

## 2019-01-12 ENCOUNTER — Other Ambulatory Visit: Payer: Self-pay | Admitting: Nurse Practitioner

## 2019-01-12 DIAGNOSIS — I10 Essential (primary) hypertension: Secondary | ICD-10-CM

## 2019-02-24 ENCOUNTER — Other Ambulatory Visit: Payer: Self-pay

## 2019-02-24 ENCOUNTER — Encounter: Payer: Self-pay | Admitting: Internal Medicine

## 2019-02-25 ENCOUNTER — Inpatient Hospital Stay: Payer: Managed Care, Other (non HMO) | Attending: Internal Medicine

## 2019-02-25 ENCOUNTER — Other Ambulatory Visit: Payer: Self-pay

## 2019-02-25 ENCOUNTER — Inpatient Hospital Stay (HOSPITAL_BASED_OUTPATIENT_CLINIC_OR_DEPARTMENT_OTHER): Payer: Managed Care, Other (non HMO) | Admitting: Internal Medicine

## 2019-02-25 VITALS — BP 145/66 | HR 67 | Temp 97.2°F | Resp 18 | Wt 208.8 lb

## 2019-02-25 DIAGNOSIS — C50811 Malignant neoplasm of overlapping sites of right female breast: Secondary | ICD-10-CM | POA: Diagnosis not present

## 2019-02-25 DIAGNOSIS — D472 Monoclonal gammopathy: Secondary | ICD-10-CM

## 2019-02-25 DIAGNOSIS — J45909 Unspecified asthma, uncomplicated: Secondary | ICD-10-CM | POA: Diagnosis not present

## 2019-02-25 DIAGNOSIS — D6489 Other specified anemias: Secondary | ICD-10-CM | POA: Insufficient documentation

## 2019-02-25 DIAGNOSIS — Z853 Personal history of malignant neoplasm of breast: Secondary | ICD-10-CM | POA: Insufficient documentation

## 2019-02-25 DIAGNOSIS — Z79899 Other long term (current) drug therapy: Secondary | ICD-10-CM | POA: Insufficient documentation

## 2019-02-25 DIAGNOSIS — G629 Polyneuropathy, unspecified: Secondary | ICD-10-CM | POA: Diagnosis not present

## 2019-02-25 DIAGNOSIS — Z171 Estrogen receptor negative status [ER-]: Secondary | ICD-10-CM | POA: Insufficient documentation

## 2019-02-25 DIAGNOSIS — Z7982 Long term (current) use of aspirin: Secondary | ICD-10-CM | POA: Diagnosis not present

## 2019-02-25 DIAGNOSIS — Z9221 Personal history of antineoplastic chemotherapy: Secondary | ICD-10-CM | POA: Insufficient documentation

## 2019-02-25 LAB — COMPREHENSIVE METABOLIC PANEL
ALT: 16 U/L (ref 0–44)
AST: 20 U/L (ref 15–41)
Albumin: 3.9 g/dL (ref 3.5–5.0)
Alkaline Phosphatase: 70 U/L (ref 38–126)
Anion gap: 7 (ref 5–15)
BUN: 19 mg/dL (ref 6–20)
CO2: 25 mmol/L (ref 22–32)
Calcium: 9.3 mg/dL (ref 8.9–10.3)
Chloride: 105 mmol/L (ref 98–111)
Creatinine, Ser: 0.9 mg/dL (ref 0.44–1.00)
GFR calc Af Amer: >60 mL/min (ref >60)
GFR calc non Af Amer: >60 mL/min (ref >60)
Glucose, Bld: 103 mg/dL — ABNORMAL HIGH (ref 70–99)
Potassium: 4.5 mmol/L (ref 3.5–5.1)
Sodium: 137 mmol/L (ref 135–145)
Total Bilirubin: 0.5 mg/dL (ref 0.3–1.2)
Total Protein: 7.8 g/dL (ref 6.5–8.1)

## 2019-02-25 LAB — CBC WITH DIFFERENTIAL/PLATELET
Abs Immature Granulocytes: 0 10*3/uL (ref 0.00–0.07)
Band Neutrophils: 1 %
Basophils Absolute: 0.1 10*3/uL (ref 0.0–0.1)
Basophils Relative: 2 %
Eosinophils Absolute: 0.5 10*3/uL (ref 0.0–0.5)
Eosinophils Relative: 9 %
HCT: 33.6 % — ABNORMAL LOW (ref 36.0–46.0)
Hemoglobin: 10.7 g/dL — ABNORMAL LOW (ref 12.0–15.0)
Lymphocytes Relative: 41 %
Lymphs Abs: 2.2 10*3/uL (ref 0.7–4.0)
MCH: 31.1 pg (ref 26.0–34.0)
MCHC: 31.8 g/dL (ref 30.0–36.0)
MCV: 97.7 fL (ref 80.0–100.0)
Monocytes Absolute: 0.4 10*3/uL (ref 0.1–1.0)
Monocytes Relative: 7 %
Neutro Abs: 2.2 10*3/uL (ref 1.7–7.7)
Neutrophils Relative %: 40 %
Platelets: 216 10*3/uL (ref 150–400)
RBC: 3.44 MIL/uL — ABNORMAL LOW (ref 3.87–5.11)
RDW: 13.9 % (ref 11.5–15.5)
Smear Review: NORMAL
WBC: 5.3 10*3/uL (ref 4.0–10.5)
nRBC: 0 % (ref 0.0–0.2)

## 2019-02-25 NOTE — Assessment & Plan Note (Signed)
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  Clinically no evidence of recurrence; April 2020-  mammogram normal.  Continue surveillance; STABLE.  # Peripheral neuropathy- sec to chemo. grade 1-2; STABLE.   # chronic mild anemia-unclear etiology.  Hb 10-stable  #Genetic counseling-patient is BRCA1 and 2- negative s/p genetics.- expanded genetics testing- NEG.   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 Awaiting from today; continue surveillance.   # DISPOSITION: # bil mamogram in April 2020 # follow up in 6 months-MD /labs- cbc/cmp/ca-27-29/MM panel;K-L panel-Dr.B

## 2019-02-25 NOTE — Progress Notes (Signed)
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   # 2012 LEFT BREAST CA STAGE I s/p Lumpec [T1 (0.7CM);Triple Neg; G-3]; s/p mammosite RT; No adj chemo  #  Mild Anemia/M-protein 0.8gm/dl [sep 2016]  # BRCA- 1&2 NEGATIVE [july 2016]; s/p Genetic counseling [June 2020]  # PN s/p acupuncture  --------------------------------------------------     DIAGNOSIS: breast cancer  STAGE:   I      ;GOALS: cure  CURRENT/MOST RECENT THERAPY: surveillaince     Malignant neoplasm of overlapping sites of right breast in female, estrogen receptor negative ([place])  08/31/2018 Genetic Testing   Two VUS identified on the CustomNext-Cancer+RNAinsight.  CFTR p.R450I and CTNNA1 p.L402S VUS.  The CustomNext-Expanded gene panel offered by [facility] and includes sequencing and rearrangement analysis for the following 81 genes: AIP, ALK, APC*, ATM*, AXIN2, BAP1, BARD1, BLM, BMPR1A, BRCA1*, BRCA2*, BRIP1*, CDC73, CDH1*, CDK4, CDKN1B, CDKN2A, CHEK2*, CTNNA1, DICER1, FANCC, FH, FLCN, GALNT12, HOXB13, KIT, MAX, MEN1, MET, MLH1*, MRE11A, MSH2*, MSH6*, MUTYH*, NBN, NF1*, NF2, NTHL1, PALB2*, PDGFRA, PHOX2B, PMS2*, POLD1, POLE, POT1, PRKAR1A, PTCH1, PTEN*, RAD50, RAD51C*, RAD51D*, RB1, RET, SDHA, SDHAF2, SDHB, SDHC, SDHD, SMAD4, SMARCA4, SMARCB1, SMARCE1, STK11, SUFU, TMEM127, TP53*, TSC1, TSC2, VHL and XRCC2 (sequencing and deletion/duplication); CASR, CFTR, CPA1, CTRC, EGFR, MITF, PRSS1 and SPINK1 (sequencing only); EPCAM and GREM1 (deletion/duplication  only). DNA and RNA analyses performed for * genes. The report date is September 01, 2018.     INTERVAL HISTORY:  A pleasant 58 year old female patient with above history of stage I right-sided triple negative breast cancer - Is here for follow-up.  In the interim patient underwent extensive genetic evaluation-for her history of breast cancer.  No other deleterious mutations noted.  Continues to have mild tingling and numbness extremities not significantly worse.  No new lumps or bumps.  No nausea no vomiting pain no headaches.  Review of Systems  Constitutional: Negative for chills, diaphoresis, fever, malaise/fatigue and weight loss.  HENT: Negative for nosebleeds and sore throat.   Eyes: Negative for double vision.  Respiratory: Negative for cough, hemoptysis, sputum production, shortness of breath and wheezing.   Cardiovascular: Negative for chest pain, palpitations, orthopnea and leg swelling.  Gastrointestinal: Negative for abdominal pain, blood in stool, constipation, diarrhea, heartburn, melena, nausea and vomiting.  Genitourinary: Negative for dysuria, frequency and urgency.  Musculoskeletal: Negative for back pain and joint pain.  Skin: Negative.  Negative for itching and rash.  Neurological: Positive for tingling. Negative for dizziness, focal weakness, weakness and headaches.  Endo/Heme/Allergies: Does not bruise/bleed easily.  Psychiatric/Behavioral: Negative for depression. The patient is not nervous/anxious and does not have insomnia.      PAST MEDICAL HISTORY :  Past Medical History:  Diagnosis Date  . Anemia   . Asthma   . BRCA negative 09/22/2014  . Breast cancer ([place]) 2012   LT LUMPECTOMY  . Breast cancer ([place]) 2016   RT LUMPECTOMY  . Breast cyst 2010   cyst- drained  . Breast screening, unspecified   . Constipation   . Dry eyes 2012  . Family  history of breast cancer   . Family history of ovarian cancer   . Family history of prostate cancer   . GERD  (gastroesophageal reflux disease)   . Hematuria   . Kidney stone   . Lump or mass in breast   . Malignant neoplasm of breast (female), unspecified site 07/08/2014   Right breast, 5 mm, T1a,N0, triple negative; high risk for recurrence on Mammoprint.  . Malignant neoplasm of upper-outer quadrant of female breast ([place]) 05/08/2010   Left breast: T1b, N0, M); triple negative, DCIS present. No chemotherapy. MammoSite radiation.  . Neuropathy    pt states in fingertips and feet  . Obesity, unspecified   . Pancreatic lesion   . Personal history of chemotherapy 2016   BREAST CA  . 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 Personal history of radiation therapy 2012   BREAST CA - MAMMOSITE  . Personal history of radiation therapy 2016   BREAST CA - MAMMOSITE  . Screening for obesity   . Seasonal allergies   . Special screening for malignant neoplasms, colon   . Unspecified essential hypertension     PAST SURGICAL HISTORY :   Past Surgical History:  Procedure Laterality Date  . BREAST BIOPSY    . BREAST CYST ASPIRATION Left 2010  . BREAST LUMPECTOMY Left 2012   BREAST CA  . BREAST LUMPECTOMY Right 2016   INVASIVE MAMMARY CARCINOMA  . BREAST LUMPECTOMY WITH AXILLARY LYMPH NODE BIOPSY Right 08/01/2014   Procedure: BREAST LUMPECTOMY WITH AXILLARY LYMPH NODE BIOPSY;  Surgeon: Robert Bellow, MD;  Location: [facility] ORS;  Service: General;  Laterality: Right;  . BREAST MAMMOSITE  April 2012   mammosite placement and removal   . BREAST SURGERY Left 2012   left breast wide excision, sentinel node, MammoSite  . BREAST SURGERY Right 08/06/2014   Wide excision/sentinel node biopsy, T1a, N0 triple negative, MammoSite  . CESAREAN SECTION  1988  . COLONOSCOPY  October 17, 2009   Dr. Dionne Milo, normal study.  Marland Kitchen  KIDNEY STONE SURGERY  2003   stones removed by Dr. Quillian Quince- stent placed/removed  . PORTACATH PLACEMENT Left 11/09/2014   Procedure: INSERTION PORT-A-CATH;  Surgeon: Robert Bellow, MD;  Location: [facility] ORS;  Service: General;  Laterality: Left;  . SENTINEL NODE BIOPSY Right 08/01/2014   Procedure: SENTINEL NODE BIOPSY;  Surgeon: Robert Bellow, MD;  Location: [facility] ORS;  Service: General;  Laterality: Right;  . TUBAL LIGATION      FAMILY HISTORY :   Family History  Problem Relation Age of Onset  . Ovarian cancer Mother   . Diabetes Mother   . Diabetes Father   . Prostate cancer Father        dx in his 40s  . Hypertension Brother   . Cancer Maternal Grandmother        bone  . Cancer Maternal Grandfather        lung  . Cancer Paternal Grandmother        lung  . Asthma Paternal Grandfather   . Hypertension Sister   . Prostate cancer Brother 50  . AAA (abdominal aortic aneurysm) Maternal Uncle     SOCIAL HISTORY:   Social History   Tobacco Use  . Smoking status: Never Smoker  . Smokeless tobacco: Never Used  Substance Use Topics  . Alcohol use: Yes    Comment: occasionally  . Drug use: No    ALLERGIES:  is allergic to cayenne pepper [cayenne]; shellfish allergy; and tape.  MEDICATIONS:  Current Outpatient Medications  Medication Sig Dispense Refill  . albuterol (PROVENTIL HFA;VENTOLIN HFA) 108 (90 Base) MCG/ACT inhaler Inhale 2 puffs into the lungs every 6 (six) hours as needed for wheezing or shortness of breath. 1 Inhaler 3  . aspirin 81 MG tablet Take 81 mg by mouth daily.    . bisacodyl (DULCOLAX) 5 MG EC tablet Take 5 mg by mouth daily as needed for moderate constipation.    . Cholecalciferol (VITAMIN D3) 1000 UNITS CAPS Take 1 capsule by mouth daily.     Marland Kitchen esomeprazole (NEXIUM) 40 MG capsule Take 40 mg by mouth 2 (two) times daily as needed (acid reflux).     Marland Kitchen lisinopril (ZESTRIL) 10 MG tablet TAKE 1 TABLET BY MOUTH  DAILY 90 tablet 0  . loratadine (CLARITIN)  10 MG tablet Take 10 mg by mouth daily. Reported on 04/13/2015    . meclizine (ANTIVERT) 12.5 MG tablet Take 1 tablet (12.5 mg total) by mouth 3 (three) times daily as needed for dizziness. 40 tablet 0  . polyethylene glycol (MIRALAX / GLYCOLAX) packet Take 17 g by mouth every other day.     No current facility-administered medications for this visit.   Facility-Administered Medications Ordered in Other Visits  Medication Dose Route Frequency Provider Last Rate Last Admin  . sodium chloride 0.9 % injection 10 mL  10 mL Intravenous PRN Leia Alf, MD   10 mL at 11/10/14 1138    PHYSICAL EXAMINATION: ECOG PERFORMANCE STATUS: 1 - Symptomatic but completely ambulatory  BP (!) 145/66 (BP Location: Left Arm, Patient Position: Sitting)   Pulse 67   Temp (!) 97.2 F (36.2 C) (Tympanic)   Resp 18   Wt 208 lb 12.8 oz (94.7 kg)   LMP  (LMP Unknown)   BMI 35.29 kg/m   Filed Weights   02/25/19 1016  Weight: 208 lb 12.8 oz (94.7 kg)    Physical Exam  Constitutional: She is oriented to person, place, and time and well-developed, well-nourished, and in no distress.  HENT:  Head: Normocephalic and atraumatic.  Mouth/Throat: Oropharynx is clear and moist. No oropharyngeal exudate.  Eyes: Pupils are equal, round, and reactive to light.  Cardiovascular: Normal rate and regular rhythm.  Pulmonary/Chest: No respiratory distress. She has no wheezes.  Abdominal: Soft. Bowel sounds are normal. She exhibits no distension and no mass. There is no abdominal tenderness. There is no rebound and no guarding.  Musculoskeletal:        General: No tenderness or edema. Normal range of motion.     Cervical back: Normal range of motion and neck supple.  Neurological: She is alert and oriented to person, place, and time.  Skin: Skin is warm.  Psychiatric: Affect normal.     LABORATORY DATA:  I have reviewed the data as listed    Component Value Date/Time   NA 137 02/25/2019 1002   NA 140 09/20/2018  0855   NA 140 07/16/2011 2130   K 4.5 02/25/2019 1002   K 3.7 07/16/2011 2130   CL 105 02/25/2019 1002   CL 106 07/16/2011 2130   CO2 25 02/25/2019 1002   CO2 27 07/16/2011 2130   GLUCOSE 103 (H) 02/25/2019 1002   GLUCOSE 93 07/16/2011 2130   BUN 19 02/25/2019 1002   BUN  18 09/20/2018 0855   BUN 16 07/16/2011 2130   CREATININE 0.90 02/25/2019 1002   CREATININE 0.95 07/16/2011 2130   CALCIUM 9.3 02/25/2019 1002   CALCIUM 9.0 07/16/2011 2130   PROT 7.8 02/25/2019 1002   PROT 7.1 09/20/2018 0855   ALBUMIN 3.9 02/25/2019 1002   ALBUMIN 4.2 09/20/2018 0855   AST 20 02/25/2019 1002   ALT 16 02/25/2019 1002   ALKPHOS 70 02/25/2019 1002   BILITOT 0.5 02/25/2019 1002   BILITOT 0.5 09/20/2018 0855   GFRNONAA >60 02/25/2019 1002   GFRNONAA >60 07/16/2011 2130   GFRAA >60 02/25/2019 1002   GFRAA >60 07/16/2011 2130    No results found for: SPEP, UPEP  Lab Results  Component Value Date   WBC 5.3 02/25/2019   NEUTROABS 2.2 02/25/2019   HGB 10.7 (L) 02/25/2019   HCT 33.6 (L) 02/25/2019   MCV 97.7 02/25/2019   PLT 216 02/25/2019      Chemistry      Component Value Date/Time   NA 137 02/25/2019 1002   NA 140 09/20/2018 0855   NA 140 07/16/2011 2130   K 4.5 02/25/2019 1002   K 3.7 07/16/2011 2130   CL 105 02/25/2019 1002   CL 106 07/16/2011 2130   CO2 25 02/25/2019 1002   CO2 27 07/16/2011 2130   BUN 19 02/25/2019 1002   BUN 18 09/20/2018 0855   BUN 16 07/16/2011 2130   CREATININE 0.90 02/25/2019 1002   CREATININE 0.95 07/16/2011 2130      Component Value Date/Time   CALCIUM 9.3 02/25/2019 1002   CALCIUM 9.0 07/16/2011 2130   ALKPHOS 70 02/25/2019 1002   AST 20 02/25/2019 1002   ALT 16 02/25/2019 1002   BILITOT 0.5 02/25/2019 1002   BILITOT 0.5 09/20/2018 0855         ASSESSMENT & PLAN:   Malignant neoplasm of overlapping sites of right breast in female, estrogen receptor negative ([place]) # 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  Clinically no  evidence of recurrence; April 2020-  mammogram normal.  Continue surveillance; STABLE.  # Peripheral neuropathy- sec to chemo. grade 1-2; STABLE.   # chronic mild anemia-unclear etiology.  Hb 10-stable  #Genetic counseling-patient is BRCA1 and 2- negative s/p genetics.- expanded genetics testing- NEG.   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 Awaiting from today; continue surveillance.   # DISPOSITION: # bil mamogram in April 2020 # follow up in 6 months-MD Reva Bores- cbc/cmp/ca-27-29/MM panel;K-L panel-Dr.B           Cammie Sickle, MD 02/25/2019 1:01 PM

## 2019-02-26 LAB — CANCER ANTIGEN 27.29: CA 27.29: 19.3 U/mL (ref 0.0–38.6)

## 2019-02-28 LAB — KAPPA/LAMBDA LIGHT CHAINS
Kappa free light chain: 26.2 mg/L — ABNORMAL HIGH (ref 3.3–19.4)
Kappa, lambda light chain ratio: 1.32 (ref 0.26–1.65)
Lambda free light chains: 19.9 mg/L (ref 5.7–26.3)

## 2019-03-01 LAB — MULTIPLE MYELOMA PANEL, SERUM
Albumin SerPl Elph-Mcnc: 3.7 g/dL (ref 2.9–4.4)
Albumin/Glob SerPl: 1.1 (ref 0.7–1.7)
Alpha 1: 0.2 g/dL (ref 0.0–0.4)
Alpha2 Glob SerPl Elph-Mcnc: 0.8 g/dL (ref 0.4–1.0)
B-Globulin SerPl Elph-Mcnc: 1 g/dL (ref 0.7–1.3)
Gamma Glob SerPl Elph-Mcnc: 1.5 g/dL (ref 0.4–1.8)
Globulin, Total: 3.5 g/dL (ref 2.2–3.9)
IgA: 141 mg/dL (ref 87–352)
IgG (Immunoglobin G), Serum: 1883 mg/dL — ABNORMAL HIGH (ref 586–1602)
IgM (Immunoglobulin M), Srm: 52 mg/dL (ref 26–217)
M Protein SerPl Elph-Mcnc: 1 g/dL — ABNORMAL HIGH
Total Protein ELP: 7.2 g/dL (ref 6.0–8.5)

## 2019-03-27 ENCOUNTER — Encounter: Payer: Self-pay | Admitting: Nurse Practitioner

## 2019-05-05 ENCOUNTER — Other Ambulatory Visit: Payer: Self-pay | Admitting: Nurse Practitioner

## 2019-05-05 DIAGNOSIS — I10 Essential (primary) hypertension: Secondary | ICD-10-CM

## 2019-06-15 ENCOUNTER — Ambulatory Visit
Admission: RE | Admit: 2019-06-15 | Discharge: 2019-06-15 | Disposition: A | Discharge status: Home / Self Care | Payer: Managed Care, Other (non HMO) | Source: Ambulatory Visit | Attending: Internal Medicine | Admitting: Internal Medicine

## 2019-06-15 DIAGNOSIS — Z171 Estrogen receptor negative status [ER-]: Secondary | ICD-10-CM

## 2019-06-15 DIAGNOSIS — C50811 Malignant neoplasm of overlapping sites of right female breast: Secondary | ICD-10-CM

## 2019-07-11 ENCOUNTER — Encounter: Payer: Self-pay | Admitting: Nurse Practitioner

## 2019-07-11 ENCOUNTER — Other Ambulatory Visit: Payer: Self-pay | Admitting: Nurse Practitioner

## 2019-07-11 DIAGNOSIS — I10 Essential (primary) hypertension: Secondary | ICD-10-CM

## 2019-07-11 NOTE — Telephone Encounter (Signed)
Requested Prescriptions  Pending Prescriptions Disp Refills  . lisinopril (ZESTRIL) 10 MG tablet [Pharmacy Med Name: LISINOPRIL  10MG   TAB] 20 tablet 17    Sig: TAKE 1 TABLET BY MOUTH  DAILY     Cardiovascular:  ACE Inhibitors Failed - 07/11/2019  7:04 AM      Failed - Last BP in normal range    BP Readings from Last 1 Encounters:  02/25/19 (!) 145/66         Failed - Valid encounter within last 6 months    Recent Outpatient Visits          9 months ago Encounter for annual physical exam   [facility], Henrine Screws T, NP   1 year ago Essential hypertension   [place], Henrine Screws T, NP   1 year ago Need for Tdap vaccination   [facility] Kathrine Haddock, NP   2 years ago Need for influenza vaccination   [facility] Kathrine Haddock, NP   2 years ago Annual physical exam   [facility] Kathrine Haddock, NP      Future Appointments            In 2 months Cannady, Barbaraann Faster, NP [employer], [facility]           Passed - Cr in normal range and within 180 days    Creatinine  Date Value Ref Range Status  07/16/2011 0.95 0.60 - 1.30 mg/dL Final   Creatinine, Ser  Date Value Ref Range Status  02/25/2019 0.90 0.44 - 1.00 mg/dL Final         Passed - K in normal range and within 180 days    Potassium  Date Value Ref Range Status  02/25/2019 4.5 3.5 - 5.1 mmol/L Final  07/16/2011 3.7 3.5 - 5.1 mmol/L Final         Passed - Patient is not pregnant      . albuterol (VENTOLIN HFA) 108 (90 Base) MCG/ACT inhaler [Pharmacy Med Name: ALBUTEROL HFA (PV)] 26.8 g     Sig: USE 2 INHALATIONS BY MOUTH  EVERY 6 HOURS AS NEEDED FOR WHEEZING OR SHORTNESS OF  BREATH     Pulmonology:  Beta Agonists Failed - 07/11/2019  7:04 AM      Failed - One inhaler should last at least one month. If the patient is requesting refills earlier, contact the patient to check for uncontrolled symptoms.      Passed - Valid encounter  within last 12 months    Recent Outpatient Visits          9 months ago Encounter for annual physical exam   [facility], Barbaraann Faster, NP   1 year ago Essential hypertension   [place], Barbaraann Faster, NP   1 year ago Need for Tdap vaccination   [facility] Kathrine Haddock, NP   2 years ago Need for influenza vaccination   [facility] Kathrine Haddock, NP   2 years ago Annual physical exam   [facility] Kathrine Haddock, NP      Future Appointments            In 2 months Cannady, Barbaraann Faster, NP [employer], [place]

## 2019-08-18 ENCOUNTER — Other Ambulatory Visit
Admission: RE | Admit: 2019-08-18 | Discharge: 2019-08-18 | Disposition: A | Discharge status: Home / Self Care | Payer: Managed Care, Other (non HMO) | Source: Ambulatory Visit | Attending: General Surgery | Admitting: General Surgery

## 2019-08-18 ENCOUNTER — Other Ambulatory Visit: Payer: Self-pay

## 2019-08-18 DIAGNOSIS — Z20822 Contact with and (suspected) exposure to covid-19: Secondary | ICD-10-CM | POA: Diagnosis not present

## 2019-08-18 DIAGNOSIS — Z01812 Encounter for preprocedural laboratory examination: Secondary | ICD-10-CM | POA: Insufficient documentation

## 2019-08-18 LAB — SARS CORONAVIRUS 2 (TAT 6-24 HRS): SARS Coronavirus 2: NEGATIVE

## 2019-08-19 ENCOUNTER — Inpatient Hospital Stay: Payer: Managed Care, Other (non HMO) | Attending: Internal Medicine

## 2019-08-19 DIAGNOSIS — C50811 Malignant neoplasm of overlapping sites of right female breast: Secondary | ICD-10-CM | POA: Diagnosis present

## 2019-08-19 DIAGNOSIS — Z171 Estrogen receptor negative status [ER-]: Secondary | ICD-10-CM

## 2019-08-19 DIAGNOSIS — D472 Monoclonal gammopathy: Secondary | ICD-10-CM | POA: Insufficient documentation

## 2019-08-19 LAB — COMPREHENSIVE METABOLIC PANEL
ALT: 17 U/L (ref 0–44)
AST: 18 U/L (ref 15–41)
Albumin: 4 g/dL (ref 3.5–5.0)
Alkaline Phosphatase: 73 U/L (ref 38–126)
Anion gap: 7 (ref 5–15)
BUN: 23 mg/dL (ref 8–23)
CO2: 26 mmol/L (ref 22–32)
Calcium: 8.9 mg/dL (ref 8.9–10.3)
Chloride: 104 mmol/L (ref 98–111)
Creatinine, Ser: 0.88 mg/dL (ref 0.44–1.00)
GFR calc Af Amer: >60 mL/min (ref >60)
GFR calc non Af Amer: >60 mL/min (ref >60)
Glucose, Bld: 119 mg/dL — ABNORMAL HIGH (ref 70–99)
Potassium: 3.7 mmol/L (ref 3.5–5.1)
Sodium: 137 mmol/L (ref 135–145)
Total Bilirubin: 0.8 mg/dL (ref 0.3–1.2)
Total Protein: 8 g/dL (ref 6.5–8.1)

## 2019-08-19 LAB — CBC WITH DIFFERENTIAL/PLATELET
Abs Immature Granulocytes: 0.02 10*3/uL (ref 0.00–0.07)
Basophils Absolute: 0 10*3/uL (ref 0.0–0.1)
Basophils Relative: 0 %
Eosinophils Absolute: 0.2 10*3/uL (ref 0.0–0.5)
Eosinophils Relative: 2 %
HCT: 30.4 % — ABNORMAL LOW (ref 36.0–46.0)
Hemoglobin: 10.1 g/dL — ABNORMAL LOW (ref 12.0–15.0)
Immature Granulocytes: 0 %
Lymphocytes Relative: 23 %
Lymphs Abs: 1.5 10*3/uL (ref 0.7–4.0)
MCH: 32.2 pg (ref 26.0–34.0)
MCHC: 33.2 g/dL (ref 30.0–36.0)
MCV: 96.8 fL (ref 80.0–100.0)
Monocytes Absolute: 0.6 10*3/uL (ref 0.1–1.0)
Monocytes Relative: 9 %
Neutro Abs: 4.4 10*3/uL (ref 1.7–7.7)
Neutrophils Relative %: 66 %
Platelets: 222 10*3/uL (ref 150–400)
RBC: 3.14 MIL/uL — ABNORMAL LOW (ref 3.87–5.11)
RDW: 14.4 % (ref 11.5–15.5)
WBC: 6.8 10*3/uL (ref 4.0–10.5)
nRBC: 0 % (ref 0.0–0.2)

## 2019-08-20 LAB — CANCER ANTIGEN 27.29: CA 27.29: 15.5 U/mL (ref 0.0–38.6)

## 2019-08-21 ENCOUNTER — Telehealth: Payer: Self-pay | Admitting: Internal Medicine

## 2019-08-21 NOTE — Telephone Encounter (Signed)
C- please schedule appt- in 2 weeks; No labs.  GB

## 2019-08-22 LAB — KAPPA/LAMBDA LIGHT CHAINS
Kappa free light chain: 25.3 mg/L — ABNORMAL HIGH (ref 3.3–19.4)
Kappa, lambda light chain ratio: 1.9 — ABNORMAL HIGH (ref 0.26–1.65)
Lambda free light chains: 13.3 mg/L (ref 5.7–26.3)

## 2019-08-22 LAB — HM COLONOSCOPY

## 2019-08-23 LAB — MULTIPLE MYELOMA PANEL, SERUM
Albumin SerPl Elph-Mcnc: 3.8 g/dL (ref 2.9–4.4)
Albumin/Glob SerPl: 1.1 (ref 0.7–1.7)
Alpha 1: 0.2 g/dL (ref 0.0–0.4)
Alpha2 Glob SerPl Elph-Mcnc: 0.8 g/dL (ref 0.4–1.0)
B-Globulin SerPl Elph-Mcnc: 1.1 g/dL (ref 0.7–1.3)
Gamma Glob SerPl Elph-Mcnc: 1.5 g/dL (ref 0.4–1.8)
Globulin, Total: 3.6 g/dL (ref 2.2–3.9)
IgA: 129 mg/dL (ref 87–352)
IgG (Immunoglobin G), Serum: 1896 mg/dL — ABNORMAL HIGH (ref 586–1602)
IgM (Immunoglobulin M), Srm: 43 mg/dL (ref 26–217)
M Protein SerPl Elph-Mcnc: 1 g/dL — ABNORMAL HIGH
Total Protein ELP: 7.4 g/dL (ref 6.0–8.5)

## 2019-09-05 ENCOUNTER — Ambulatory Visit
Admission: RE | Admit: 2019-09-05 | Discharge: 2019-09-05 | Disposition: A | Discharge status: Home / Self Care | Payer: Managed Care, Other (non HMO) | Source: Ambulatory Visit | Attending: Internal Medicine | Admitting: Internal Medicine

## 2019-09-05 ENCOUNTER — Inpatient Hospital Stay: Payer: Managed Care, Other (non HMO) | Admitting: Internal Medicine

## 2019-09-05 ENCOUNTER — Ambulatory Visit
Admission: RE | Admit: 2019-09-05 | Discharge: 2019-09-05 | Disposition: A | Discharge status: Home / Self Care | Payer: Managed Care, Other (non HMO) | Attending: Internal Medicine | Admitting: Internal Medicine

## 2019-09-05 ENCOUNTER — Encounter: Payer: Self-pay | Admitting: Internal Medicine

## 2019-09-05 ENCOUNTER — Other Ambulatory Visit: Payer: Self-pay

## 2019-09-05 VITALS — BP 161/95 | HR 95 | Temp 98.4°F | Resp 16 | Ht 64.5 in | Wt 214.0 lb

## 2019-09-05 DIAGNOSIS — Z171 Estrogen receptor negative status [ER-]: Secondary | ICD-10-CM

## 2019-09-05 DIAGNOSIS — C50811 Malignant neoplasm of overlapping sites of right female breast: Secondary | ICD-10-CM

## 2019-09-05 DIAGNOSIS — D472 Monoclonal gammopathy: Secondary | ICD-10-CM

## 2019-09-05 NOTE — Assessment & Plan Note (Addendum)
#   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  Clinically no evidence of recurrence; April 2021- normal; Continue surveillance; STABLE.   # Peripheral neuropathy- sec to chemo. grade 1-2; STABLE  # chronic mild anemia-unclear etiology.  Hb 10- STABLE.   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 Trauma/fall- skeletal survey-given above MGUS.  # DISPOSITION: will call with results of x-rays # follow up in 7 months-MD /labs- cbc/cmp/ca-27-29/MM panel;K-L panel-Dr.B

## 2019-09-05 NOTE — Progress Notes (Signed)
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   # 2012 LEFT BREAST CA STAGE I s/p Lumpec [T1 (0.7CM);Triple Neg; G-3]; s/p mammosite RT; No adj chemo  #  Mild Anemia/M-protein 0.8gm/dl [sep 2016]  # BRCA- 1&2 NEGATIVE [july 2016]; s/p Genetic counseling [June 2020]  # PN s/p acupuncture  --------------------------------------------------     DIAGNOSIS: breast cancer  STAGE:   I      ;GOALS: cure  CURRENT/MOST RECENT THERAPY: surveillaince     Malignant neoplasm of overlapping sites of right breast in female, estrogen receptor negative ([place])  08/31/2018 Genetic Testing   Two VUS identified on the CustomNext-Cancer+RNAinsight.  CFTR p.R450I and CTNNA1 p.L402S VUS.  The CustomNext-Expanded gene panel offered by [facility] and includes sequencing and rearrangement analysis for the following 81 genes: AIP, ALK, APC*, ATM*, AXIN2, BAP1, BARD1, BLM, BMPR1A, BRCA1*, BRCA2*, BRIP1*, CDC73, CDH1*, CDK4, CDKN1B, CDKN2A, CHEK2*, CTNNA1, DICER1, FANCC, FH, FLCN, GALNT12, HOXB13, KIT, MAX, MEN1, MET, MLH1*, MRE11A, MSH2*, MSH6*, MUTYH*, NBN, NF1*, NF2, NTHL1, PALB2*, PDGFRA, PHOX2B, PMS2*, POLD1, POLE, POT1, PRKAR1A, PTCH1, PTEN*, RAD50, RAD51C*, RAD51D*, RB1, RET, SDHA, SDHAF2, SDHB, SDHC, SDHD, SMAD4, SMARCA4, SMARCB1, SMARCE1, STK11, SUFU, TMEM127, TP53*, TSC1, TSC2, VHL and XRCC2 (sequencing and deletion/duplication); CASR, CFTR, CPA1, CTRC, EGFR, MITF, PRSS1 and SPINK1 (sequencing only); EPCAM and GREM1 (deletion/duplication  only). DNA and RNA analyses performed for * genes. The report date is September 01, 2018.     INTERVAL HISTORY:  A pleasant 58 year old female patient with above history of stage I right-sided triple negative breast cancer - Is here for follow-up.  Patient complaint left shoulder pain over the last 2 to 3 months.  Progressive getting worse.  Intermittent.    Patient fell; slipped on a wet floor-not sure if she hurt her shoulder.   Otherwise appetite is good.  No weight loss.  No headaches.  No worsening tingling or numbness.  Review of Systems  Constitutional: Negative for chills, diaphoresis, fever, malaise/fatigue and weight loss.  HENT: Negative for nosebleeds and sore throat.   Eyes: Negative for double vision.  Respiratory: Negative for cough, hemoptysis, sputum production, shortness of breath and wheezing.   Cardiovascular: Negative for chest pain, palpitations, orthopnea and leg swelling.  Gastrointestinal: Negative for abdominal pain, blood in stool, constipation, diarrhea, heartburn, melena, nausea and vomiting.  Genitourinary: Negative for dysuria, frequency and urgency.  Musculoskeletal: Positive for joint pain. Negative for back pain.  Skin: Negative.  Negative for itching and rash.  Neurological: Positive for tingling. Negative for dizziness, focal weakness, weakness and headaches.  Endo/Heme/Allergies: Does not bruise/bleed easily.  Psychiatric/Behavioral: Negative for depression. The patient is not nervous/anxious and does not have insomnia.      PAST MEDICAL HISTORY :  Past Medical History:  Diagnosis Date  . Anemia   . Asthma   . BRCA negative 09/22/2014  . Breast cancer ([place]) 2012   LT LUMPECTOMY  . Breast cancer ([place]) 2016   RT LUMPECTOMY  . Breast screening, unspecified   . Constipation   . Dry eyes 2012  . Family  history of breast cancer   . Family history of ovarian cancer   . Family history of prostate cancer   . GERD (gastroesophageal reflux disease)    . Hematuria   . Kidney stone   . Lump or mass in breast   . Malignant neoplasm of breast (female), unspecified site 07/08/2014   Right breast, 5 mm, T1a,N0, triple negative; high risk for recurrence on Mammoprint.  . Malignant neoplasm of upper-outer quadrant of female breast ([place]) 05/08/2010   Left breast: T1b, N0, M); triple negative, DCIS present. No chemotherapy. MammoSite radiation.  . Neuropathy    pt states in fingertips and feet  . Obesity, unspecified   . Pancreatic lesion   . Personal history of chemotherapy 2016   BREAST CA- Right  . 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 Personal history of radiation therapy 2012   BREAST CA - MAMMOSITE- Left  . Personal history of radiation therapy 2016   BREAST CA - MAMMOSITE- Right  . Screening for obesity   . Seasonal allergies   . Special screening for malignant neoplasms, colon   . Unspecified essential hypertension     PAST SURGICAL HISTORY :   Past Surgical History:  Procedure Laterality Date  . BREAST BIOPSY Right 2016   Invasive  . BREAST CYST ASPIRATION Left 2010  . BREAST LUMPECTOMY Left 2012   BREAST CA  . BREAST LUMPECTOMY Right 2016   INVASIVE MAMMARY CARCINOMA  . BREAST LUMPECTOMY WITH AXILLARY LYMPH NODE BIOPSY Right 08/01/2014   Procedure: BREAST LUMPECTOMY WITH AXILLARY LYMPH NODE BIOPSY;  Surgeon: Robert Bellow, MD;  Location: [facility] ORS;  Service: General;  Laterality: Right;  . BREAST MAMMOSITE  April 2012   mammosite placement and removal   . BREAST SURGERY Left 2012   left breast wide excision, sentinel node, MammoSite  . BREAST SURGERY Right 08/06/2014   Wide excision/sentinel node biopsy, T1a, N0 triple negative, MammoSite  . CESAREAN SECTION  1988  . COLONOSCOPY  October 17, 2009   Dr. Dionne Milo, normal study.   Marland Kitchen KIDNEY STONE SURGERY  2003   stones removed by Dr. Quillian Quince- stent placed/removed  . PORTACATH PLACEMENT Left 11/09/2014   Procedure: INSERTION PORT-A-CATH;  Surgeon: Robert Bellow, MD;  Location: [facility] ORS;  Service: General;  Laterality: Left;  . SENTINEL NODE BIOPSY Right 08/01/2014   Procedure: SENTINEL NODE BIOPSY;  Surgeon: Robert Bellow, MD;  Location: [facility] ORS;  Service: General;  Laterality: Right;  . TUBAL LIGATION      FAMILY HISTORY :   Family History  Problem Relation Age of Onset  . Ovarian cancer Mother   . Diabetes Mother   . Diabetes Father   . Prostate cancer Father        dx in his 40s  . Hypertension Brother   . Cancer Maternal Grandmother        bone  . Cancer Maternal Grandfather        lung  . Cancer Paternal Grandmother        lung  . Asthma Paternal Grandfather   . Hypertension Sister   . Prostate cancer Brother 81  . AAA (abdominal aortic aneurysm) Maternal Uncle   . Breast cancer Neg Hx     SOCIAL HISTORY:   Social History   Tobacco Use  .  Smoking status: Never Smoker  . Smokeless tobacco: Never Used  Vaping Use  . Vaping Use: Never used  Substance Use Topics  . Alcohol use: Yes    Comment: occasionally  . Drug use: No    ALLERGIES:  is allergic to cayenne pepper [cayenne], shellfish allergy, and tape.  MEDICATIONS:  Current Outpatient Medications  Medication Sig Dispense Refill  . albuterol (VENTOLIN HFA) 108 (90 Base) MCG/ACT inhaler USE 2 INHALATIONS BY MOUTH  EVERY 6 HOURS AS NEEDED FOR WHEEZING OR SHORTNESS OF  BREATH 26.8 g 0  . aspirin 81 MG tablet Take 81 mg by mouth daily.    . bisacodyl (DULCOLAX) 5 MG EC tablet Take 5 mg by mouth daily as needed for moderate constipation.    . Cholecalciferol (VITAMIN D3) 1000 UNITS CAPS Take 1 capsule by mouth daily.     Marland Kitchen esomeprazole (NEXIUM) 40 MG capsule Take 40 mg by mouth 2 (two) times daily as needed (acid reflux).     Marland Kitchen lisinopril (ZESTRIL) 10 MG tablet TAKE 1 TABLET BY MOUTH   DAILY 20 tablet 17  . loratadine (CLARITIN) 10 MG tablet Take 10 mg by mouth daily. Reported on 04/13/2015    . meclizine (ANTIVERT) 12.5 MG tablet Take 1 tablet (12.5 mg total) by mouth 3 (three) times daily as needed for dizziness. 40 tablet 0  . polyethylene glycol (MIRALAX / GLYCOLAX) packet Take 17 g by mouth every other day.    . Turmeric 500 MG CAPS Take 500 mg by mouth daily.     No current facility-administered medications for this visit.   Facility-Administered Medications Ordered in Other Visits  Medication Dose Route Frequency Provider Last Rate Last Admin  . sodium chloride 0.9 % injection 10 mL  10 mL Intravenous PRN Leia Alf, MD   10 mL at 11/10/14 1138    PHYSICAL EXAMINATION: ECOG PERFORMANCE STATUS: 1 - Symptomatic but completely ambulatory  BP (!) 161/95 (BP Location: Left Arm, Patient Position: Sitting, Cuff Size: Large)   Pulse 95   Temp 98.4 F (36.9 C) (Oral)   Resp 16   Ht 5' 4.5" (1.638 m)   Wt 214 lb (97.1 kg)   LMP  (LMP Unknown)   SpO2 100%   BMI 36.17 kg/m   Filed Weights   09/05/19 0950  Weight: 214 lb (97.1 kg)    Physical Exam HENT:     Head: Normocephalic and atraumatic.     Mouth/Throat:     Pharynx: No oropharyngeal exudate.  Eyes:     Pupils: Pupils are equal, round, and reactive to light.  Cardiovascular:     Rate and Rhythm: Normal rate and regular rhythm.  Pulmonary:     Effort: No respiratory distress.     Breath sounds: No wheezing.  Abdominal:     General: Bowel sounds are normal. There is no distension.     Palpations: Abdomen is soft. There is no mass.     Tenderness: There is no abdominal tenderness. There is no guarding or rebound.  Musculoskeletal:        General: No tenderness. Normal range of motion.     Cervical back: Normal range of motion and neck supple.  Skin:    General: Skin is warm.  Neurological:     Mental Status: She is alert and oriented to person, place, and time.  Psychiatric:        Mood and  Affect: Affect normal.      LABORATORY DATA:  I have  reviewed the data as listed    Component Value Date/Time   NA 137 08/19/2019 1000   NA 140 09/20/2018 0855   NA 140 07/16/2011 2130   K 3.7 08/19/2019 1000   K 3.7 07/16/2011 2130   CL 104 08/19/2019 1000   CL 106 07/16/2011 2130   CO2 26 08/19/2019 1000   CO2 27 07/16/2011 2130   GLUCOSE 119 (H) 08/19/2019 1000   GLUCOSE 93 07/16/2011 2130   BUN 23 08/19/2019 1000   BUN 18 09/20/2018 0855   BUN 16 07/16/2011 2130   CREATININE 0.88 08/19/2019 1000   CREATININE 0.95 07/16/2011 2130   CALCIUM 8.9 08/19/2019 1000   CALCIUM 9.0 07/16/2011 2130   PROT 8.0 08/19/2019 1000   PROT 7.1 09/20/2018 0855   ALBUMIN 4.0 08/19/2019 1000   ALBUMIN 4.2 09/20/2018 0855   AST 18 08/19/2019 1000   ALT 17 08/19/2019 1000   ALKPHOS 73 08/19/2019 1000   BILITOT 0.8 08/19/2019 1000   BILITOT 0.5 09/20/2018 0855   GFRNONAA >60 08/19/2019 1000   GFRNONAA >60 07/16/2011 2130   GFRAA >60 08/19/2019 1000   GFRAA >60 07/16/2011 2130    No results found for: SPEP, UPEP  Lab Results  Component Value Date   WBC 6.8 08/19/2019   NEUTROABS 4.4 08/19/2019   HGB 10.1 (L) 08/19/2019   HCT 30.4 (L) 08/19/2019   MCV 96.8 08/19/2019   PLT 222 08/19/2019      Chemistry      Component Value Date/Time   NA 137 08/19/2019 1000   NA 140 09/20/2018 0855   NA 140 07/16/2011 2130   K 3.7 08/19/2019 1000   K 3.7 07/16/2011 2130   CL 104 08/19/2019 1000   CL 106 07/16/2011 2130   CO2 26 08/19/2019 1000   CO2 27 07/16/2011 2130   BUN 23 08/19/2019 1000   BUN 18 09/20/2018 0855   BUN 16 07/16/2011 2130   CREATININE 0.88 08/19/2019 1000   CREATININE 0.95 07/16/2011 2130      Component Value Date/Time   CALCIUM 8.9 08/19/2019 1000   CALCIUM 9.0 07/16/2011 2130   ALKPHOS 73 08/19/2019 1000   AST 18 08/19/2019 1000   ALT 17 08/19/2019 1000   BILITOT 0.8 08/19/2019 1000   BILITOT 0.5 09/20/2018 0855         ASSESSMENT & PLAN:    Malignant neoplasm of overlapping sites of right breast in female, estrogen receptor negative ([place]) # 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  Clinically no evidence of recurrence; April 2021- normal; Continue surveillance; STABLE.   # Peripheral neuropathy- sec to chemo. grade 1-2; STABLE  # chronic mild anemia-unclear etiology.  Hb 10- STABLE.   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 Trauma/fall- skeletal survey-given above MGUS.  # DISPOSITION: will call with results of x-rays # follow up in 7 months-MD Reva Bores- cbc/cmp/ca-27-29/MM panel;K-L panel-Dr.B           Cammie Sickle, MD 09/05/2019 12:18 PM

## 2019-09-05 NOTE — Patient Instructions (Signed)
Bone x-rays- [facility]

## 2019-09-07 ENCOUNTER — Telehealth: Payer: Self-pay | Admitting: Internal Medicine

## 2019-09-07 NOTE — Telephone Encounter (Signed)
On 6/29-I spoke with the patient regarding the results of the skeletal survey negative for any concern for myeloma-like process.  Recommend follow-up with PCP regarding her continued left shoulder pain.  Patient in agreement.

## 2019-09-11 ENCOUNTER — Other Ambulatory Visit: Payer: Self-pay | Admitting: Nurse Practitioner

## 2019-09-11 NOTE — Telephone Encounter (Signed)
Requested Prescriptions  Pending Prescriptions Disp Refills  . albuterol (VENTOLIN HFA) 108 (90 Base) MCG/ACT inhaler [Pharmacy Med Name: ALBUTEROL HFA (PV)] 26.8 g 3    Sig: USE 2 INHALATIONS BY MOUTH  EVERY 6 HOURS AS NEEDED FOR WHEEZING OR SHORTNESS OF  BREATH     Pulmonology:  Beta Agonists Failed - 09/11/2019  6:50 AM      Failed - One inhaler should last at least one month. If the patient is requesting refills earlier, contact the patient to check for uncontrolled symptoms.      Passed - Valid encounter within last 12 months    Recent Outpatient Visits          11 months ago Encounter for annual physical exam   [facility] [place], Barbaraann Faster, NP   1 year ago Essential hypertension   [place], Barbaraann Faster, NP   1 year ago Need for Tdap vaccination   [facility] Kathrine Haddock, NP   2 years ago Need for influenza vaccination   [facility] Kathrine Haddock, NP   3 years ago Annual physical exam   [facility] Kathrine Haddock, NP      Future Appointments            In 2 weeks [place], Barbaraann Faster, NP [employer], [place]

## 2019-09-26 ENCOUNTER — Ambulatory Visit (INDEPENDENT_AMBULATORY_CARE_PROVIDER_SITE_OTHER): Payer: Managed Care, Other (non HMO) | Admitting: Nurse Practitioner

## 2019-09-26 ENCOUNTER — Encounter: Payer: Self-pay | Admitting: Nurse Practitioner

## 2019-09-26 ENCOUNTER — Other Ambulatory Visit: Payer: Self-pay

## 2019-09-26 VITALS — BP 114/70 | HR 66 | Temp 98.2°F | Ht 64.5 in | Wt 209.6 lb

## 2019-09-26 DIAGNOSIS — I10 Essential (primary) hypertension: Secondary | ICD-10-CM

## 2019-09-26 DIAGNOSIS — G620 Drug-induced polyneuropathy: Secondary | ICD-10-CM | POA: Diagnosis not present

## 2019-09-26 DIAGNOSIS — E669 Obesity, unspecified: Secondary | ICD-10-CM | POA: Insufficient documentation

## 2019-09-26 DIAGNOSIS — E7800 Pure hypercholesterolemia, unspecified: Secondary | ICD-10-CM

## 2019-09-26 DIAGNOSIS — T451X5A Adverse effect of antineoplastic and immunosuppressive drugs, initial encounter: Secondary | ICD-10-CM

## 2019-09-26 DIAGNOSIS — E782 Mixed hyperlipidemia: Secondary | ICD-10-CM | POA: Insufficient documentation

## 2019-09-26 DIAGNOSIS — D472 Monoclonal gammopathy: Secondary | ICD-10-CM | POA: Diagnosis not present

## 2019-09-26 DIAGNOSIS — Z6835 Body mass index (BMI) 35.0-35.9, adult: Secondary | ICD-10-CM

## 2019-09-26 DIAGNOSIS — Z853 Personal history of malignant neoplasm of breast: Secondary | ICD-10-CM | POA: Diagnosis not present

## 2019-09-26 DIAGNOSIS — Z0000 Encounter for general adult medical examination without abnormal findings: Secondary | ICD-10-CM

## 2019-09-26 DIAGNOSIS — R7303 Prediabetes: Secondary | ICD-10-CM

## 2019-09-26 DIAGNOSIS — J4520 Mild intermittent asthma, uncomplicated: Secondary | ICD-10-CM

## 2019-09-26 LAB — UA/M W/RFLX CULTURE, ROUTINE
Bilirubin, UA: NEGATIVE
Glucose, UA: NEGATIVE
Ketones, UA: NEGATIVE
Leukocytes,UA: NEGATIVE
Nitrite, UA: NEGATIVE
Protein,UA: NEGATIVE
RBC, UA: NEGATIVE
Specific Gravity, UA: 1.025 (ref 1.005–1.030)
Urobilinogen, Ur: 0.2 mg/dL (ref 0.2–1.0)
pH, UA: 5.5 (ref 5.0–7.5)

## 2019-09-26 MED ORDER — LISINOPRIL 10 MG PO TABS: 10.0000 mg | ORAL_TABLET | Freq: Every day | ORAL | 4 refills | Status: DC

## 2019-09-26 NOTE — Assessment & Plan Note (Signed)
Chronic, stable with minimal use of inhaler.  Continue current medication regimen and adjust as needed.

## 2019-09-26 NOTE — Assessment & Plan Note (Signed)
Noted on recent labs at 115 and ASCVD 4.7%.  Continue diet focus, provided education on this.  Lipid panel today.  Initiate medication as needed.

## 2019-09-26 NOTE — Progress Notes (Addendum)
BP 114/70   Pulse 66   Temp 98.2 F (36.8 C) (Oral)   Ht 5' 4.5" (1.638 m)   Wt 209 lb 9.6 oz (95.1 kg)   LMP  (LMP Unknown)   SpO2 99%   BMI 35.42 kg/m    Subjective:    Patient ID: Adrienne [name], female    DOB: November 12, 1960, 59 y.o.   MRN: [id number]  HPI: Adrienne [name] is a 59 y.o. female presenting on 09/26/2019 for comprehensive medical examination. Current medical complaints include:none  She currently lives with: husband Menopausal Symptoms: no   Recent breast exam performed by Dr. Bary Castilla on April 15th.  HYPERTENSION Currently taking Lisinopril and ASA.  Her last A1C in 2020 was 5.8%.  She has been making diet changes at home.  Hypertension status: controlled  Satisfied with current treatment? yes Duration of hypertension: chronic BP monitoring frequency:  daily BP range: 110/70's range at home BP medication side effects:  no Medication compliance: good compliance Aspirin: yes Recurrent headaches: no Visual changes: no Palpitations: no Dyspnea: no Chest pain: no Lower extremity edema: trace on both sides, baseline -- compression hose at home Dizzy/lightheaded: no  The 10-year ASCVD risk score Mikey Bussing DC Jr., et al., 2013) is: 4.7%   Values used to calculate the score:     Age: 59 years     Sex: Female     Is Non-Hispanic African American: Yes     Diabetic: No     Tobacco smoker: No     Systolic Blood Pressure: 272 mmHg     Is BP treated: Yes     HDL Cholesterol: 64 mg/dL     Total Cholesterol: 192 mg/dL   ASTHMA Current medications include Albuterol and Claritin. Asthma status: stable Satisfied with current treatment?: yes Albuterol/rescue inhaler frequency: maybe every few months Dyspnea frequency: none Wheezing frequency: none Cough frequency: none Nocturnal symptom frequency: none Limitation of activity: no Current upper respiratory symptoms: no Triggers: seasonal Home peak flows: none Last Spirometry: unknown Failed/intolerant to  following asthma meds: none Asthma meds in past: none Aerochamber/spacer use: no Visits to ER or Urgent Care in past year: no Pneumovax: Up to Date Influenza: Up to Date  MGUS: Saw Dr. Tish Men last 09/05/19.  She did have a bone density that day.  No changes made.  Continues to follow with Dr. Bary Castilla annually due to breast cancer history, has been survivor -- left 2012 and right 2016.  They are monitoring her anemia, she reports history of kidney stones -- did see urology but did not need surgery.  Denies any symptoms or pain today.   Depression Screen done today and results listed below:  Depression screen [facility] 2/9 09/26/2019 09/20/2018 09/14/2017 11/03/2016 08/29/2016  Decreased Interest 0 0 3 0 0  Down, Depressed, Hopeless 0 0 2 0 0  PHQ - 2 Score 0 0 5 0 0  Altered sleeping - 0 1 - -  Tired, decreased energy - 0 2 - -  Change in appetite - 0 0 - -  Feeling bad or failure about yourself  - 0 2 - -  Trouble concentrating - 0 0 - -  Moving slowly or fidgety/restless - 0 1 - -  Suicidal thoughts - 0 1 - -  PHQ-9 Score - 0 12 - -  Difficult doing work/chores - Not difficult at all - - -    The patient does not have a history of falls. I did not complete a risk assessment  for falls. A plan of care for falls was not documented.   Past Medical History:  Past Medical History:  Diagnosis Date  . Anemia   . Asthma   . BRCA negative 09/22/2014  . Breast cancer ([place]) 2012   LT LUMPECTOMY  . Breast cancer ([place]) 2016   RT LUMPECTOMY  . Breast screening, unspecified   . Constipation   . Dry eyes 2012  . Family history of breast cancer   . Family history of ovarian cancer   . Family history of prostate cancer   . GERD (gastroesophageal reflux disease)   . Hematuria   . Kidney stone   . Lump or mass in breast   . Malignant neoplasm of breast (female), unspecified site 07/08/2014   Right breast, 5 mm, T1a,N0, triple negative; high risk for recurrence on Mammoprint.  . Malignant  neoplasm of upper-outer quadrant of female breast ([place]) 05/08/2010   Left breast: T1b, N0, M); triple negative, DCIS present. No chemotherapy. MammoSite radiation.  . Neuropathy    pt states in fingertips and feet  . Obesity, unspecified   . Pancreatic lesion   . Personal history of chemotherapy 2016   BREAST CA- Right  . 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 Personal history of radiation therapy 2012   BREAST CA - MAMMOSITE- Left  . Personal history of radiation therapy 2016   BREAST CA - MAMMOSITE- Right  . Screening for obesity   . Seasonal allergies   . Special screening for malignant neoplasms, colon   . Unspecified essential hypertension     Surgical History:  Past Surgical History:  Procedure Laterality Date  . BREAST BIOPSY Right 2016   Invasive  . BREAST CYST ASPIRATION Left 2010  . BREAST LUMPECTOMY Left 2012   BREAST CA  . BREAST LUMPECTOMY Right 2016   INVASIVE MAMMARY CARCINOMA  . BREAST LUMPECTOMY WITH AXILLARY LYMPH NODE BIOPSY Right 08/01/2014   Procedure: BREAST LUMPECTOMY WITH AXILLARY LYMPH NODE BIOPSY;  Surgeon: Robert Bellow, MD;  Location: [facility] ORS;  Service: General;  Laterality: Right;  . BREAST MAMMOSITE  April 2012   mammosite placement and removal   . BREAST SURGERY Left 2012   left breast wide excision, sentinel node, MammoSite  . BREAST SURGERY Right 08/06/2014   Wide excision/sentinel node biopsy, T1a, N0 triple negative, MammoSite  . CESAREAN SECTION  1988  . COLONOSCOPY  October 17, 2009   Dr. Dionne Milo, normal study.  Marland Kitchen KIDNEY STONE SURGERY  2003   stones removed by Dr. Quillian Quince- stent placed/removed  . PORTACATH PLACEMENT Left 11/09/2014   Procedure: INSERTION PORT-A-CATH;  Surgeon: Robert Bellow, MD;  Location: [facility] ORS;  Service: General;   Laterality: Left;  . SENTINEL NODE BIOPSY Right 08/01/2014   Procedure: SENTINEL NODE BIOPSY;  Surgeon: Robert Bellow, MD;  Location: [facility] ORS;  Service: General;  Laterality: Right;  . TUBAL LIGATION      Medications:  Current Outpatient Medications on File Prior to Visit  Medication Sig  . albuterol (VENTOLIN HFA) 108 (90 Base) MCG/ACT inhaler USE 2 INHALATIONS BY MOUTH  EVERY 6 HOURS AS NEEDED FOR WHEEZING OR SHORTNESS OF  BREATH  . aspirin 81 MG tablet Take 81 mg by mouth daily.  . bisacodyl (DULCOLAX) 5 MG EC tablet Take 5 mg  by mouth daily as needed for moderate constipation.  . Cholecalciferol (VITAMIN D3) 1000 UNITS CAPS Take 1 capsule by mouth daily.   Marland Kitchen esomeprazole (NEXIUM) 40 MG capsule Take 40 mg by mouth 2 (two) times daily as needed (acid reflux).   Marland Kitchen loratadine (CLARITIN) 10 MG tablet Take 10 mg by mouth daily. Reported on 04/13/2015  . polyethylene glycol (MIRALAX / GLYCOLAX) packet Take 17 g by mouth every other day.  . Turmeric 500 MG CAPS Take 500 mg by mouth daily.   Current Facility-Administered Medications on File Prior to Visit  Medication  . sodium chloride 0.9 % injection 10 mL    Allergies:  Allergies  Allergen Reactions  . Cayenne Pepper [Cayenne] Shortness Of Breath  . Shellfish Allergy Other (See Comments)    Hives and swelling on ears and feet  . Tape Rash    Surgical tape from breast biopsy    Social History:  Social History   Socioeconomic History  . Marital status: Single    Spouse name: Not on file  . Number of children: Not on file  . Years of education: Not on file  . Highest education level: Not on file  Occupational History  . Not on file  Tobacco Use  . Smoking status: Never Smoker  . Smokeless tobacco: Never Used  Vaping Use  . Vaping Use: Never used  Substance and Sexual Activity  . Alcohol use: Yes    Comment: occasionally  . Drug use: No  . Sexual activity: Yes  Other Topics Concern  . Not on file  Social History  Narrative  . Not on file   Social Determinants of Health   Financial Resource Strain:   . Difficulty of Paying Living Expenses:   Food Insecurity:   . Worried About Charity fundraiser in the Last Year:   . Arboriculturist in the Last Year:   Transportation Needs:   . Film/video editor (Medical):   Marland Kitchen Lack of Transportation (Non-Medical):   Physical Activity:   . Days of Exercise per Week:   . Minutes of Exercise per Session:   Stress:   . Feeling of Stress :   Social Connections:   . Frequency of Communication with Friends and Family:   . Frequency of Social Gatherings with Friends and Family:   . Attends Religious Services:   . Active Member of Clubs or Organizations:   . Attends Archivist Meetings:   Marland Kitchen Marital Status:   Intimate Partner Violence:   . Fear of Current or Ex-Partner:   . Emotionally Abused:   Marland Kitchen Physically Abused:   . Sexually Abused:    Social History   Tobacco Use  Smoking Status Never Smoker  Smokeless Tobacco Never Used   Social History   Substance and Sexual Activity  Alcohol Use Yes   Comment: occasionally    Family History:  Family History  Problem Relation Age of Onset  . Ovarian cancer Mother   . Diabetes Mother   . Diabetes Father   . Prostate cancer Father        dx in his 68s  . Hypertension Brother   . Cancer Maternal Grandmother        bone  . Cancer Maternal Grandfather        lung  . Cancer Paternal Grandmother        lung  . Asthma Paternal Grandfather   . Hypertension Sister   . Prostate cancer Brother 23  .  AAA (abdominal aortic aneurysm) Maternal Uncle   . Breast cancer Neg Hx     Past medical history, surgical history, medications, allergies, family history and social history reviewed with patient today and changes made to appropriate areas of the chart.   Review of Systems - negative All other ROS negative except what is listed above and in the HPI.      Objective:    BP 114/70   Pulse 66    Temp 98.2 F (36.8 C) (Oral)   Ht 5' 4.5" (1.638 m)   Wt 209 lb 9.6 oz (95.1 kg)   LMP  (LMP Unknown)   SpO2 99%   BMI 35.42 kg/m   Wt Readings from Last 3 Encounters:  09/26/19 209 lb 9.6 oz (95.1 kg)  09/05/19 214 lb (97.1 kg)  02/25/19 208 lb 12.8 oz (94.7 kg)    Physical Exam Constitutional:      General: She is awake. She is not in acute distress.    Appearance: She is well-developed. She is not ill-appearing.  HENT:     Head: Normocephalic and atraumatic.     Right Ear: Hearing, tympanic membrane, ear canal and external ear normal. No drainage.     Left Ear: Hearing, tympanic membrane, ear canal and external ear normal. No drainage.     Nose: Nose normal.     Right Sinus: No maxillary sinus tenderness or frontal sinus tenderness.     Left Sinus: No maxillary sinus tenderness or frontal sinus tenderness.     Mouth/Throat:     Mouth: Mucous membranes are moist.     Pharynx: Oropharynx is clear. Uvula midline. No pharyngeal swelling, oropharyngeal exudate or posterior oropharyngeal erythema.  Eyes:     General: Lids are normal.        Right eye: No discharge.        Left eye: No discharge.     Extraocular Movements: Extraocular movements intact.     Conjunctiva/sclera: Conjunctivae normal.     Pupils: Pupils are equal, round, and reactive to light.     Visual Fields: Right eye visual fields normal and left eye visual fields normal.  Neck:     Thyroid: No thyromegaly.     Vascular: No carotid bruit.     Trachea: Trachea normal.  Cardiovascular:     Rate and Rhythm: Normal rate and regular rhythm.     Heart sounds: Normal heart sounds. No murmur heard.  No gallop.   Pulmonary:     Effort: Pulmonary effort is normal. No accessory muscle usage or respiratory distress.     Breath sounds: Normal breath sounds.  Chest:     Comments: Deferred due to recent exams. Abdominal:     General: Bowel sounds are normal.     Palpations: Abdomen is soft. There is no hepatomegaly  or splenomegaly.     Tenderness: There is no abdominal tenderness.  Musculoskeletal:        General: Normal range of motion.     Cervical back: Normal range of motion and neck supple.     Right lower leg: No edema.     Left lower leg: No edema.  Lymphadenopathy:     Head:     Right side of head: No submental, submandibular, tonsillar, preauricular or posterior auricular adenopathy.     Left side of head: No submental, submandibular, tonsillar, preauricular or posterior auricular adenopathy.     Cervical: No cervical adenopathy.  Skin:    General: Skin is warm  and dry.     Capillary Refill: Capillary refill takes less than 2 seconds.     Findings: No rash.  Neurological:     Mental Status: She is alert and oriented to person, place, and time.     Cranial Nerves: Cranial nerves are intact.     Gait: Gait is intact.     Deep Tendon Reflexes: Reflexes are normal and symmetric.     Reflex Scores:      Brachioradialis reflexes are 2+ on the right side and 2+ on the left side.      Patellar reflexes are 2+ on the right side and 2+ on the left side. Psychiatric:        Attention and Perception: Attention normal.        Mood and Affect: Mood normal.        Speech: Speech normal.        Behavior: Behavior normal. Behavior is cooperative.        Thought Content: Thought content normal.        Judgment: Judgment normal.     Results for orders placed or performed in visit on 08/26/19  HM COLONOSCOPY  Result Value Ref Range   HM Colonoscopy See Report (in chart) See Report (in chart), Patient Reported      Assessment & Plan:   Problem List Items Addressed This Visit      Cardiovascular and Mediastinum   Hypertension    Chronic, stable with BP at goal.  Continue current medication regimen and adjust as needed.  Recommend she continue to monitor BP regularly at home and document + focus on DASH diet.  CMP, CBC, TSH, and urine today.  Return to office in 6 months.      Relevant  Medications   lisinopril (ZESTRIL) 10 MG tablet   Other Relevant Orders   Comprehensive metabolic panel   TSH   UA/M w/rflx Culture, Routine     Respiratory   Asthma    Chronic, stable with minimal use of inhaler.  Continue current medication regimen and adjust as needed.        Relevant Orders   CBC with Differential/Platelet     Nervous and Auditory   Peripheral neuropathy due to chemotherapy (HCC)    Chronic, stable without medication at this time.  Has had side effects with Lyrica and Gabapentin, extreme fatigue.        Other   History of breast cancer    Continue to collaborate with oncology and review notes.      MGUS (monoclonal gammopathy of unknown significance)    Continue collaboration with oncology, recent notes reviewed.  Patient denies any pain today.      Prediabetes    Noted on past labs, recheck A1C today and continue diet focus.  Initiate medications as needed.      Relevant Orders   HgB A1c   Elevated LDL cholesterol level    Noted on recent labs at 115 and ASCVD 4.7%.  Continue diet focus, provided education on this.  Lipid panel today.  Initiate medication as needed.      Relevant Orders   Lipid Panel w/o Chol/HDL Ratio   Obesity    BMI 35.42 today.  Recommended eating smaller high protein, low fat meals more frequently and exercising 30 mins a day 5 times a week with a goal of 10-15lb weight loss in the next 3 months. Patient voiced their understanding and motivation to adhere to these recommendations.  Other Visit Diagnoses    Routine general medical examination at a health care facility    -  Primary       Follow up plan: Return in about 6 months (around 03/28/2020) for HTN and HLD.   LABORATORY TESTING:  - Pap smear: not applicable  IMMUNIZATIONS:   - Tdap: Tetanus vaccination status reviewed: last tetanus booster within 10 years. - Influenza: Up to date - Pneumovax: Up to date - Prevnar: Not applicable - HPV: Not  applicable - Zostavax vaccine: Up to date  SCREENING: -Mammogram: Up to date  - Colonoscopy: Up to date  - Bone Density: Not applicable  -Hearing Test: Not applicable  -Spirometry: Not applicable   PATIENT COUNSELING:   Advised to take 1 mg of folate supplement per day if capable of pregnancy.   Sexuality: Discussed sexually transmitted diseases, partner selection, use of condoms, avoidance of unintended pregnancy  and contraceptive alternatives.   Advised to avoid cigarette smoking.  I discussed with the patient that most people either abstain from alcohol or drink within safe limits (<=14/week and <=4 drinks/occasion for males, <=7/weeks and <= 3 drinks/occasion for females) and that the risk for alcohol disorders and other health effects rises proportionally with the number of drinks per week and how often a drinker exceeds daily limits.  Discussed cessation/primary prevention of drug use and availability of treatment for abuse.   Diet: Encouraged to adjust caloric intake to maintain  or achieve ideal body weight, to reduce intake of dietary saturated fat and total fat, to limit sodium intake by avoiding high sodium foods and not adding table salt, and to maintain adequate dietary potassium and calcium preferably from fresh fruits, vegetables, and low-fat dairy products.    stressed the importance of regular exercise  Injury prevention: Discussed safety belts, safety helmets, smoke detector, smoking near bedding or upholstery.   Dental health: Discussed importance of regular tooth brushing, flossing, and dental visits.    NEXT PREVENTATIVE PHYSICAL DUE IN 1 YEAR. Return in about 6 months (around 03/28/2020) for HTN and HLD.

## 2019-09-26 NOTE — Progress Notes (Signed)
Contacted via MyChart

## 2019-09-26 NOTE — Assessment & Plan Note (Signed)
Noted on past labs, recheck A1C today and continue diet focus.  Initiate medications as needed.

## 2019-09-26 NOTE — Assessment & Plan Note (Signed)
BMI 35.42 today.  Recommended eating smaller high protein, low fat meals more frequently and exercising 30 mins a day 5 times a week with a goal of 10-15lb weight loss in the next 3 months. Patient voiced their understanding and motivation to adhere to these recommendations.

## 2019-09-26 NOTE — Assessment & Plan Note (Signed)
Chronic, stable without medication at this time.  Has had side effects with Lyrica and Gabapentin, extreme fatigue.

## 2019-09-26 NOTE — Assessment & Plan Note (Signed)
Chronic, stable with BP at goal.  Continue current medication regimen and adjust as needed.  Recommend she continue to monitor BP regularly at home and document + focus on DASH diet.  CMP, CBC, TSH, and urine today.  Return to office in 6 months.

## 2019-09-26 NOTE — Patient Instructions (Signed)
Healthy Eating Following a healthy eating pattern may help you to achieve and maintain a healthy body weight, reduce the risk of chronic disease, and live a long and productive life. It is important to follow a healthy eating pattern at an appropriate calorie level for your body. Your nutritional needs should be met primarily through food by choosing a variety of nutrient-rich foods. What are tips for following this plan? Reading food labels  Read labels and choose the following: ? Reduced or low sodium. ? Juices with 100% fruit juice. ? Foods with low saturated fats and high polyunsaturated and monounsaturated fats. ? Foods with whole grains, such as whole wheat, cracked wheat, brown rice, and wild rice. ? Whole grains that are fortified with folic acid. This is recommended for women who are pregnant or who want to become pregnant.  Read labels and avoid the following: ? Foods with a lot of added sugars. These include foods that contain brown sugar, corn sweetener, corn syrup, dextrose, fructose, glucose, high-fructose corn syrup, honey, invert sugar, lactose, malt syrup, maltose, molasses, raw sugar, sucrose, trehalose, or turbinado sugar.  Do not eat more than the following amounts of added sugar per day:  6 teaspoons (25 g) for women.  9 teaspoons (38 g) for men. ? Foods that contain processed or refined starches and grains. ? Refined grain products, such as white flour, degermed cornmeal, white bread, and white rice. Shopping  Choose nutrient-rich snacks, such as vegetables, whole fruits, and nuts. Avoid high-calorie and high-sugar snacks, such as potato chips, fruit snacks, and candy.  Use oil-based dressings and spreads on foods instead of solid fats such as butter, stick margarine, or cream cheese.  Limit pre-made sauces, mixes, and "instant" products such as flavored rice, instant noodles, and ready-made pasta.  Try more plant-protein sources, such as tofu, tempeh, black beans,  edamame, lentils, nuts, and seeds.  Explore eating plans such as the Mediterranean diet or vegetarian diet. Cooking  Use oil to saut or stir-fry foods instead of solid fats such as butter, stick margarine, or lard.  Try baking, boiling, grilling, or broiling instead of frying.  Remove the fatty part of meats before cooking.  Steam vegetables in water or broth. Meal planning   At meals, imagine dividing your plate into fourths: ? One-half of your plate is fruits and vegetables. ? One-fourth of your plate is whole grains. ? One-fourth of your plate is protein, especially lean meats, poultry, eggs, tofu, beans, or nuts.  Include low-fat dairy as part of your daily diet. Lifestyle  Choose healthy options in all settings, including home, work, school, restaurants, or stores.  Prepare your food safely: ? Wash your hands after handling raw meats. ? Keep food preparation surfaces clean by regularly washing with hot, soapy water. ? Keep raw meats separate from ready-to-eat foods, such as fruits and vegetables. ? Cook seafood, meat, poultry, and eggs to the recommended internal temperature. ? Store foods at safe temperatures. In general:  Keep cold foods at 59F (4.4C) or below.  Keep hot foods at 159F (60C) or above.  Keep your freezer at [facility] (-17.8C) or below.  Foods are no longer safe to eat when they have been between the temperatures of 40-159F (4.4-60C) for more than 2 hours. What foods should I eat? Fruits Aim to eat 2 cup-equivalents of fresh, canned (in natural juice), or frozen fruits each day. Examples of 1 cup-equivalent of fruit include 1 small apple, 8 large strawberries, 1 cup canned fruit,  cup  dried fruit, or 1 cup 100% juice. Vegetables Aim to eat 2-3 cup-equivalents of fresh and frozen vegetables each day, including different varieties and colors. Examples of 1 cup-equivalent of vegetables include 2 medium carrots, 2 cups raw, leafy greens, 1 cup chopped  vegetable (raw or cooked), or 1 medium baked potato. Grains Aim to eat 6 ounce-equivalents of whole grains each day. Examples of 1 ounce-equivalent of grains include 1 slice of bread, 1 cup ready-to-eat cereal, 3 cups popcorn, or  cup cooked rice, pasta, or cereal. Meats and other proteins Aim to eat 5-6 ounce-equivalents of protein each day. Examples of 1 ounce-equivalent of protein include 1 egg, 1/2 cup nuts or seeds, or 1 tablespoon (16 g) peanut butter. A cut of meat or fish that is the size of a deck of cards is about 3-4 ounce-equivalents.  Of the protein you eat each week, try to have at least 8 ounces come from seafood. This includes salmon, trout, herring, and anchovies. Dairy Aim to eat 3 cup-equivalents of fat-free or low-fat dairy each day. Examples of 1 cup-equivalent of dairy include 1 cup (240 mL) milk, 8 ounces (250 g) yogurt, 1 ounces (44 g) natural cheese, or 1 cup (240 mL) fortified soy milk. Fats and oils  Aim for about 5 teaspoons (21 g) per day. Choose monounsaturated fats, such as canola and olive oils, avocados, peanut butter, and most nuts, or polyunsaturated fats, such as sunflower, corn, and soybean oils, walnuts, pine nuts, sesame seeds, sunflower seeds, and flaxseed. Beverages  Aim for six 8-oz glasses of water per day. Limit coffee to three to five 8-oz cups per day.  Limit caffeinated beverages that have added calories, such as soda and energy drinks.  Limit alcohol intake to no more than 1 drink a day for nonpregnant women and 2 drinks a day for men. One drink equals 12 oz of beer (355 mL), 5 oz of wine (148 mL), or 1 oz of hard liquor (44 mL). Seasoning and other foods  Avoid adding excess amounts of salt to your foods. Try flavoring foods with herbs and spices instead of salt.  Avoid adding sugar to foods.  Try using oil-based dressings, sauces, and spreads instead of solid fats. This information is based on general [place] nutrition guidelines. For more  information, visit [employer]. Exact amounts may vary based on your nutrition needs. Summary  A healthy eating plan may help you to maintain a healthy weight, reduce the risk of chronic diseases, and stay active throughout your life.  Plan your meals. Make sure you eat the right portions of a variety of nutrient-rich foods.  Try baking, boiling, grilling, or broiling instead of frying.  Choose healthy options in all settings, including home, work, school, restaurants, or stores. This information is not intended to replace advice given to you by your health care provider. Make sure you discuss any questions you have with your health care provider. Document Revised: 06/08/2017 Document Reviewed: 06/08/2017 Elsevier Patient Education  [place].

## 2019-09-26 NOTE — Assessment & Plan Note (Signed)
Continue collaboration with oncology, recent notes reviewed.  Patient denies any pain today. 

## 2019-09-26 NOTE — Assessment & Plan Note (Signed)
Continue to collaborate with oncology and review notes. 

## 2019-09-27 LAB — COMPREHENSIVE METABOLIC PANEL
ALT: 15 IU/L (ref 0–32)
AST: 20 IU/L (ref 0–40)
Albumin/Globulin Ratio: 1.4 (ref 1.2–2.2)
Albumin: 4.2 g/dL (ref 3.8–4.8)
Alkaline Phosphatase: 72 IU/L (ref 48–121)
BUN/Creatinine Ratio: 18 (ref 12–28)
BUN: 16 mg/dL (ref 8–27)
Bilirubin Total: 0.5 mg/dL (ref 0.0–1.2)
CO2: 21 mmol/L (ref 20–29)
Calcium: 9.4 mg/dL (ref 8.7–10.3)
Chloride: 107 mmol/L — ABNORMAL HIGH (ref 96–106)
Creatinine, Ser: 0.89 mg/dL (ref 0.57–1.00)
GFR calc Af Amer: 81 mL/min/{1.73_m2} (ref >59)
GFR calc non Af Amer: 70 mL/min/{1.73_m2} (ref >59)
Globulin, Total: 3.1 g/dL (ref 1.5–4.5)
Glucose: 86 mg/dL (ref 65–99)
Potassium: 4.1 mmol/L (ref 3.5–5.2)
Sodium: 141 mmol/L (ref 134–144)
Total Protein: 7.3 g/dL (ref 6.0–8.5)

## 2019-09-27 LAB — CBC WITH DIFFERENTIAL/PLATELET
Basophils Absolute: 0 10*3/uL (ref 0.0–0.2)
Basos: 1 %
EOS (ABSOLUTE): 0.2 10*3/uL (ref 0.0–0.4)
Eos: 3 %
Hematocrit: 33.1 % — ABNORMAL LOW (ref 34.0–46.6)
Hemoglobin: 10.5 g/dL — ABNORMAL LOW (ref 11.1–15.9)
Immature Grans (Abs): 0 10*3/uL (ref 0.0–0.1)
Immature Granulocytes: 0 %
Lymphocytes Absolute: 2 10*3/uL (ref 0.7–3.1)
Lymphs: 36 %
MCH: 31.5 pg (ref 26.6–33.0)
MCHC: 31.7 g/dL (ref 31.5–35.7)
MCV: 99 fL — ABNORMAL HIGH (ref 79–97)
Monocytes Absolute: 0.5 10*3/uL (ref 0.1–0.9)
Monocytes: 8 %
Neutrophils Absolute: 2.9 10*3/uL (ref 1.4–7.0)
Neutrophils: 52 %
Platelets: 197 10*3/uL (ref 150–450)
RBC: 3.33 x10E6/uL — ABNORMAL LOW (ref 3.77–5.28)
RDW: 13.2 % (ref 11.7–15.4)
WBC: 5.5 10*3/uL (ref 3.4–10.8)

## 2019-09-27 LAB — LIPID PANEL W/O CHOL/HDL RATIO
Cholesterol, Total: 189 mg/dL (ref 100–199)
HDL: 63 mg/dL (ref >39)
LDL Chol Calc (NIH): 115 mg/dL — ABNORMAL HIGH (ref 0–99)
Triglycerides: 58 mg/dL (ref 0–149)
VLDL Cholesterol Cal: 11 mg/dL (ref 5–40)

## 2019-09-27 LAB — HEMOGLOBIN A1C
Est. average glucose Bld gHb Est-mCnc: 123 mg/dL
Hgb A1c MFr Bld: 5.9 % — ABNORMAL HIGH (ref 4.8–5.6)

## 2019-09-27 LAB — TSH: TSH: 1.69 u[IU]/mL (ref 0.450–4.500)

## 2019-09-27 NOTE — Progress Notes (Signed)
Contacted via [facility] morning Carmela.  Your labs have returned.   - CBC continues to be stable with some mild anemia we will continue to monitor closely, along with your oncology provider. - Kidney and liver function are normal - thyroid testing normal - A1C continues to show some prediabetes at 5.9%, continue diet focus at home. - Your LDL is above normal. The LDL is the bad cholesterol. Over time and in combination with inflammation and other factors, this contributes to plaque which in turn may lead to stroke and/or heart attack down the road. Sometimes high LDL is primarily genetic, and people might be eating all the right foods but still have high numbers. Other times, there is room for improvement in one's diet and eating healthier can bring this number down and potentially reduce one's risk of heart attack and/or stroke.   To reduce your LDL, Remember - more fruits and vegetables, more fish, and limit red meat and dairy products. More soy, nuts, beans, barley, lentils, oats and plant sterol ester enriched margarine instead of butter. I also encourage eliminating sugar and processed food. Remember, shop on the outside of the grocery store and visit your [employer]. If you would like to talk with me about dietary changes for your cholesterol, please let me know. We should recheck your cholesterol in 6 months. Keep being awesome!! Kindest regards, Caton Popowski

## 2019-10-17 ENCOUNTER — Encounter: Payer: Managed Care, Other (non HMO) | Admitting: Nurse Practitioner

## 2019-11-04 ENCOUNTER — Encounter: Payer: Self-pay | Admitting: Radiation Oncology

## 2019-11-04 ENCOUNTER — Other Ambulatory Visit: Payer: Self-pay

## 2019-11-07 ENCOUNTER — Encounter: Payer: Self-pay | Admitting: Radiation Oncology

## 2019-11-07 ENCOUNTER — Other Ambulatory Visit: Payer: Self-pay

## 2019-11-07 ENCOUNTER — Ambulatory Visit
Admission: RE | Admit: 2019-11-07 | Discharge: 2019-11-07 | Disposition: A | Discharge status: Home / Self Care | Payer: Managed Care, Other (non HMO) | Source: Ambulatory Visit | Attending: Radiation Oncology | Admitting: Radiation Oncology

## 2019-11-07 VITALS — BP 157/88 | HR 92 | Temp 97.2°F | Wt 208.0 lb

## 2019-11-07 DIAGNOSIS — Z171 Estrogen receptor negative status [ER-]: Secondary | ICD-10-CM

## 2019-11-07 NOTE — Progress Notes (Signed)
Radiation Oncology Follow up Note  Name: Adrienne [name]   Date:   11/07/2019 MRN:  [id number] DOB: 07-Dec-1960    This 60 y.o. female presents to the clinic today for 5-year follow-up status post accelerated partial breast radiation to her bilateral breasts for invasive mammary carcinoma.  REFERRING PROVIDER: Venita Lick, NP  HPI: Patient is a 59 year old female now about 5 years having completed bilateral accelerated partial breast radiation.  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  Both tumors were ER/PR negative she is not on antiestrogen therapy she is seen today in routine follow-up is doing well she specifically denies breast tenderness cough or bone pain.  Her last mammogram.  Was back in April BI-RADS 2 benign which I have reviewed.  COMPLICATIONS OF TREATMENT: none  FOLLOW UP COMPLIANCE: keeps appointments   PHYSICAL EXAM:  BP (!) 157/88   Pulse 92   Temp (!) 97.2 F (36.2 C) (Tympanic)   Wt 208 lb (94.3 kg)   LMP  (LMP Unknown)   BMI 35.15 kg/m  Macro breast exam she does have in both lumpectomy sites firmness consistent with high-dose-rate remote afterloading.  No other discernible lesions are noted.  No axillary or supraclavicular adenopathy is identified.  Well-developed well-nourished patient in NAD. HEENT reveals PERLA, EOMI, discs not visualized.  Oral cavity is clear. No oral mucosal lesions are identified. Neck is clear without evidence of cervical or supraclavicular adenopathy. Lungs are clear to A&P. Cardiac examination is essentially unremarkable with regular rate and rhythm without murmur rub or thrill. Abdomen is benign with no organomegaly or masses noted. Motor sensory and DTR levels are equal and symmetric in the upper and lower extremities. Cranial nerves II through XII are grossly intact. Proprioception is intact. No peripheral adenopathy or edema  is identified. No motor or sensory levels are noted. Crude visual fields are within normal range.  RADIOLOGY RESULTS: Mammograms reviewed compatible with above-stated findings  PLAN: Present time patient is doing well with no evidence of disease now out 5 years I am going to discontinue follow-up care.  Patient will continue with her surgeon and medical oncology.  I be happy to reevaluate the patient anytime should further consultation be indicated.  Patient knows to call with any concerns.  I would like to take this opportunity to thank you for allowing me to participate in the care of your patient.Noreene Filbert, MD

## 2019-12-22 ENCOUNTER — Other Ambulatory Visit: Payer: Self-pay | Admitting: Nurse Practitioner

## 2019-12-22 DIAGNOSIS — Z87442 Personal history of urinary calculi: Secondary | ICD-10-CM

## 2019-12-22 NOTE — Telephone Encounter (Signed)
Copied from [id number]. Topic: Referral - Request for Referral >> Dec 22, 2019  1:52 PM Hinda Lenis D wrote: Has patient seen PCP for this complaint? NO *If NO, is insurance requiring patient see PCP for this issue before PCP can refer them? Referral for which specialty: Urology  Preferred provider/office:  [place]  Call [phone]  Reason for referral: kidney stones

## 2019-12-23 ENCOUNTER — Other Ambulatory Visit: Payer: Self-pay

## 2019-12-23 ENCOUNTER — Ambulatory Visit (INDEPENDENT_AMBULATORY_CARE_PROVIDER_SITE_OTHER): Payer: Managed Care, Other (non HMO)

## 2019-12-23 DIAGNOSIS — Z23 Encounter for immunization: Secondary | ICD-10-CM | POA: Diagnosis not present

## 2020-01-06 NOTE — Progress Notes (Signed)
01/09/2020 8:32 AM   Adrienne [name] 1961/02/15 [id number]  Referring provider: Venita Lick, NP [address] [place],  [place] [address] Chief Complaint  Patient presents with  . Nephrolithiasis    HPI: Adrienne [name] is a 59 y.o. female who presents today for evaluation and management of history of nephrolithiasis.   -Patient has a long history of kidney stones.  -Patient saw Dr. Tresa Moore in 12/2014 (see note for details). -CT A/P from 12/13/2014 bilateral, nonobstructing renal calculi with largest measuring 7 mm -Ureteroscopic stone removal by Dr. Quillian Quince 2003 -68monthhistory intermittent right flank pain -Nonradiating without precipitating, aggravating or alleviating factors -Severity mild to moderate -No nausea or vomiting. No hematuria. -Denies fever, chills. -Mild urinary hesitancy -She heavily drinks tea.    PMH: Past Medical History:  Diagnosis Date  . Anemia   . Asthma   . BRCA negative 09/22/2014  . Breast cancer ([facility] 2012   LT LUMPECTOMY  . Breast cancer ([facility] 2016   RT LUMPECTOMY  . Breast screening, unspecified   . Constipation   . Dry eyes 2012  . Family history of breast cancer   . Family history of ovarian cancer   . Family history of prostate cancer   . GERD (gastroesophageal reflux disease)   . Hematuria   . Kidney stone   . Lump or mass in breast   . Malignant neoplasm of breast (female), unspecified site 07/08/2014   Right breast, 5 mm, T1a,N0, triple negative; high risk for recurrence on Mammoprint.  . Malignant neoplasm of upper-outer quadrant of female breast ([facility] 05/08/2010   Left breast: T1b, N0, M); triple negative, DCIS present. No chemotherapy. MammoSite radiation.  . Neuropathy    pt states in fingertips and feet  . Obesity, unspecified   . Pancreatic lesion   . Personal history of chemotherapy 2016   BREAST CA- Right  . Personal history of malignant neoplasm of breast 2012   has been treated with left breast wide excision and  radiation therapy; Patient has DCIS as well as a 7 mm infiltrating mammary carcinoma triple negative removed on 05-08-10  . Personal history of radiation therapy 2012   BREAST CA - MAMMOSITE- Left  . Personal history of radiation therapy 2016   BREAST CA - MAMMOSITE- Right  . Screening for obesity   . Seasonal allergies   . Special screening for malignant neoplasms, colon   . Unspecified essential hypertension     Surgical History: Past Surgical History:  Procedure Laterality Date  . BREAST BIOPSY Right 2016   Invasive  . BREAST CYST ASPIRATION Left 2010  . BREAST LUMPECTOMY Left 2012   BREAST CA  . BREAST LUMPECTOMY Right 2016   INVASIVE MAMMARY CARCINOMA  . BREAST LUMPECTOMY WITH AXILLARY LYMPH NODE BIOPSY Right 08/01/2014   Procedure: BREAST LUMPECTOMY WITH AXILLARY LYMPH NODE BIOPSY;  Surgeon: JRobert Bellow MD;  Location: [facility] ORS;  Service: General;  Laterality: Right;  . BREAST MAMMOSITE  April 2012   mammosite placement and removal   . BREAST SURGERY Left 2012   left breast wide excision, sentinel node, MammoSite  . BREAST SURGERY Right 08/06/2014   Wide excision/sentinel node biopsy, T1a, N0 triple negative, MammoSite  . CESAREAN SECTION  1988  . COLONOSCOPY  October 17, 2009   Dr. IDionne Milo normal study.  .Marland KitchenKIDNEY STONE SURGERY  2003   stones removed by Dr. DQuillian Quince stent placed/removed  . PORTACATH PLACEMENT Left 11/09/2014   Procedure: INSERTION PORT-A-CATH;  Surgeon: Robert Bellow, MD;  Location: [facility] ORS;  Service: General;  Laterality: Left;  . SENTINEL NODE BIOPSY Right 08/01/2014   Procedure: SENTINEL NODE BIOPSY;  Surgeon: Robert Bellow, MD;  Location: [facility] ORS;  Service: General;  Laterality: Right;  . TUBAL LIGATION      Home Medications:  Allergies as of 01/09/2020      Reactions   Cayenne Pepper [cayenne] Shortness Of Breath   Shellfish Allergy Other (See Comments)   Hives and swelling on ears and feet   Tape Rash   Surgical tape from breast  biopsy      Medication List       Accurate as of January 09, 2020  8:32 AM. If you have any questions, ask your nurse or doctor.        albuterol 108 (90 Base) MCG/ACT inhaler Commonly known as: VENTOLIN HFA USE 2 INHALATIONS BY MOUTH  EVERY 6 HOURS AS NEEDED FOR WHEEZING OR SHORTNESS OF  BREATH   aspirin 81 MG tablet Take 81 mg by mouth daily.   bisacodyl 5 MG EC tablet Commonly known as: DULCOLAX Take 5 mg by mouth daily as needed for moderate constipation.   esomeprazole 40 MG capsule Commonly known as: NEXIUM Take 40 mg by mouth 2 (two) times daily as needed (acid reflux).   lisinopril 10 MG tablet Commonly known as: ZESTRIL Take 1 tablet (10 mg total) by mouth daily.   loratadine 10 MG tablet Commonly known as: CLARITIN Take 10 mg by mouth daily. Reported on 04/13/2015   polyethylene glycol 17 g packet Commonly known as: MIRALAX / GLYCOLAX Take 17 g by mouth every other day.   Turmeric 500 MG Caps Take 500 mg by mouth daily.   Vitamin D3 25 MCG (1000 UT) Caps Take 1 capsule by mouth daily.       Allergies:  Allergies  Allergen Reactions  . Cayenne Pepper [Cayenne] Shortness Of Breath  . Shellfish Allergy Other (See Comments)    Hives and swelling on ears and feet  . Tape Rash    Surgical tape from breast biopsy    Family History: Family History  Problem Relation Age of Onset  . Ovarian cancer Mother   . Diabetes Mother   . Diabetes Father   . Prostate cancer Father        dx in his 36s  . Hypertension Brother   . Cancer Maternal Grandmother        bone  . Cancer Maternal Grandfather        lung  . Cancer Paternal Grandmother        lung  . Asthma Paternal Grandfather   . Hypertension Sister   . Prostate cancer Brother 40  . AAA (abdominal aortic aneurysm) Maternal Uncle   . Breast cancer Neg Hx     Social History:  reports that she has never smoked. She has never used smokeless tobacco. She reports current alcohol use. She reports that  she does not use drugs.   Physical Exam: BP (!) 185/82   Pulse (!) 102   Ht 5' 5" (1.651 m)   Wt 208 lb (94.3 kg)   LMP  (LMP Unknown)   BMI 34.61 kg/m   Constitutional:  Alert and oriented, No acute distress. HEENT: [place] AT, moist mucus membranes.  Trachea midline, no masses. Cardiovascular: No clubbing, cyanosis, or edema. Respiratory: Normal respiratory effort, no increased work of breathing. Skin: No rashes, bruises or suspicious lesions. Neurologic: Grossly intact, no  focal deficits, moving all 4 extremities. Psychiatric: Normal mood and affect.  Laboratory Data:  Lab Results  Component Value Date   CREATININE 0.89 09/26/2019    Urinalysis Pending   Assessment & Plan:    1.  Bilateral nephrolithiasis  No recent imaging.   KUB today, if unremarkable I will proceed with stone protocol CT A/P.     [facility] [address], [place], [place] [address] [phone]  I, Selena Batten, am acting as a scribe for Dr. Nicki Reaper C. Stoioff,  I have reviewed the above documentation for accuracy and completeness, and I agree with the above.    Abbie Sons, MD

## 2020-01-09 ENCOUNTER — Other Ambulatory Visit
Admission: RE | Admit: 2020-01-09 | Discharge: 2020-01-09 | Disposition: A | Discharge status: Home / Self Care | Payer: Managed Care, Other (non HMO) | Source: Ambulatory Visit | Attending: Urology | Admitting: Urology

## 2020-01-09 ENCOUNTER — Ambulatory Visit: Payer: Managed Care, Other (non HMO) | Admitting: Urology

## 2020-01-09 ENCOUNTER — Encounter: Payer: Self-pay | Admitting: Urology

## 2020-01-09 ENCOUNTER — Ambulatory Visit
Admission: RE | Admit: 2020-01-09 | Discharge: 2020-01-09 | Disposition: A | Discharge status: Home / Self Care | Payer: Managed Care, Other (non HMO) | Source: Ambulatory Visit | Attending: Urology | Admitting: Urology

## 2020-01-09 ENCOUNTER — Other Ambulatory Visit: Payer: Self-pay

## 2020-01-09 VITALS — BP 185/82 | HR 102 | Ht 65.0 in | Wt 208.0 lb

## 2020-01-09 DIAGNOSIS — N200 Calculus of kidney: Secondary | ICD-10-CM | POA: Insufficient documentation

## 2020-01-09 DIAGNOSIS — R109 Unspecified abdominal pain: Secondary | ICD-10-CM

## 2020-01-10 LAB — URINALYSIS, COMPLETE
Bilirubin, UA: NEGATIVE
Glucose, UA: NEGATIVE
Ketones, UA: NEGATIVE
Leukocytes,UA: NEGATIVE
Nitrite, UA: NEGATIVE
Protein,UA: NEGATIVE
RBC, UA: NEGATIVE
Specific Gravity, UA: 1.025 (ref 1.005–1.030)
Urobilinogen, Ur: 1 mg/dL (ref 0.2–1.0)
pH, UA: 6 (ref 5.0–7.5)

## 2020-01-10 LAB — MICROSCOPIC EXAMINATION

## 2020-01-20 ENCOUNTER — Telehealth: Payer: Self-pay | Admitting: Family Medicine

## 2020-01-20 DIAGNOSIS — Z87442 Personal history of urinary calculi: Secondary | ICD-10-CM

## 2020-01-20 DIAGNOSIS — R109 Unspecified abdominal pain: Secondary | ICD-10-CM

## 2020-01-20 NOTE — Telephone Encounter (Signed)
-----   Message from Abbie Sons, MD sent at 01/20/2020  2:09 PM EST ----- KUB showed no evidence of stone.  If she is having persistent pain would recommend scheduling stone protocol CT

## 2020-01-20 NOTE — Telephone Encounter (Signed)
Patient notified and she is still having the pain. She wants to have the Ct scan done.

## 2020-01-20 NOTE — Telephone Encounter (Signed)
Order was entered.  Will call with results

## 2020-03-05 ENCOUNTER — Ambulatory Visit: Payer: Managed Care, Other (non HMO) | Admitting: Internal Medicine

## 2020-03-05 ENCOUNTER — Other Ambulatory Visit: Payer: Managed Care, Other (non HMO)

## 2020-03-12 ENCOUNTER — Encounter (INDEPENDENT_AMBULATORY_CARE_PROVIDER_SITE_OTHER): Payer: Self-pay

## 2020-03-28 ENCOUNTER — Other Ambulatory Visit: Payer: Self-pay

## 2020-03-28 DIAGNOSIS — Z171 Estrogen receptor negative status [ER-]: Secondary | ICD-10-CM

## 2020-03-28 DIAGNOSIS — C50811 Malignant neoplasm of overlapping sites of right female breast: Secondary | ICD-10-CM

## 2020-03-28 DIAGNOSIS — D472 Monoclonal gammopathy: Secondary | ICD-10-CM

## 2020-04-02 ENCOUNTER — Inpatient Hospital Stay (HOSPITAL_BASED_OUTPATIENT_CLINIC_OR_DEPARTMENT_OTHER): Payer: Managed Care, Other (non HMO) | Admitting: Internal Medicine

## 2020-04-02 ENCOUNTER — Encounter: Payer: Self-pay | Admitting: Internal Medicine

## 2020-04-02 ENCOUNTER — Inpatient Hospital Stay: Payer: Managed Care, Other (non HMO) | Attending: Internal Medicine

## 2020-04-02 VITALS — BP 124/71 | HR 66 | Temp 97.8°F | Resp 16 | Ht 65.0 in | Wt 213.0 lb

## 2020-04-02 DIAGNOSIS — I10 Essential (primary) hypertension: Secondary | ICD-10-CM | POA: Insufficient documentation

## 2020-04-02 DIAGNOSIS — K219 Gastro-esophageal reflux disease without esophagitis: Secondary | ICD-10-CM | POA: Diagnosis not present

## 2020-04-02 DIAGNOSIS — Z923 Personal history of irradiation: Secondary | ICD-10-CM | POA: Insufficient documentation

## 2020-04-02 DIAGNOSIS — D472 Monoclonal gammopathy: Secondary | ICD-10-CM | POA: Diagnosis not present

## 2020-04-02 DIAGNOSIS — Z853 Personal history of malignant neoplasm of breast: Secondary | ICD-10-CM | POA: Diagnosis not present

## 2020-04-02 DIAGNOSIS — Z8249 Family history of ischemic heart disease and other diseases of the circulatory system: Secondary | ICD-10-CM | POA: Diagnosis not present

## 2020-04-02 DIAGNOSIS — Z13820 Encounter for screening for osteoporosis: Secondary | ICD-10-CM

## 2020-04-02 DIAGNOSIS — Z7982 Long term (current) use of aspirin: Secondary | ICD-10-CM | POA: Diagnosis not present

## 2020-04-02 DIAGNOSIS — Z171 Estrogen receptor negative status [ER-]: Secondary | ICD-10-CM | POA: Diagnosis not present

## 2020-04-02 DIAGNOSIS — Z803 Family history of malignant neoplasm of breast: Secondary | ICD-10-CM | POA: Diagnosis not present

## 2020-04-02 DIAGNOSIS — Z8041 Family history of malignant neoplasm of ovary: Secondary | ICD-10-CM | POA: Diagnosis not present

## 2020-04-02 DIAGNOSIS — Z79899 Other long term (current) drug therapy: Secondary | ICD-10-CM | POA: Diagnosis not present

## 2020-04-02 DIAGNOSIS — Z801 Family history of malignant neoplasm of trachea, bronchus and lung: Secondary | ICD-10-CM | POA: Insufficient documentation

## 2020-04-02 DIAGNOSIS — Z9221 Personal history of antineoplastic chemotherapy: Secondary | ICD-10-CM | POA: Diagnosis not present

## 2020-04-02 DIAGNOSIS — C50811 Malignant neoplasm of overlapping sites of right female breast: Secondary | ICD-10-CM

## 2020-04-02 DIAGNOSIS — J45909 Unspecified asthma, uncomplicated: Secondary | ICD-10-CM | POA: Insufficient documentation

## 2020-04-02 DIAGNOSIS — Z8042 Family history of malignant neoplasm of prostate: Secondary | ICD-10-CM | POA: Diagnosis not present

## 2020-04-02 DIAGNOSIS — Z833 Family history of diabetes mellitus: Secondary | ICD-10-CM | POA: Diagnosis not present

## 2020-04-02 LAB — CBC WITH DIFFERENTIAL/PLATELET
Abs Immature Granulocytes: 0.02 10*3/uL (ref 0.00–0.07)
Basophils Absolute: 0 10*3/uL (ref 0.0–0.1)
Basophils Relative: 1 %
Eosinophils Absolute: 0.2 10*3/uL (ref 0.0–0.5)
Eosinophils Relative: 3 %
HCT: 31.1 % — ABNORMAL LOW (ref 36.0–46.0)
Hemoglobin: 10.5 g/dL — ABNORMAL LOW (ref 12.0–15.0)
Immature Granulocytes: 0 %
Lymphocytes Relative: 37 %
Lymphs Abs: 2.2 10*3/uL (ref 0.7–4.0)
MCH: 32.3 pg (ref 26.0–34.0)
MCHC: 33.8 g/dL (ref 30.0–36.0)
MCV: 95.7 fL (ref 80.0–100.0)
Monocytes Absolute: 0.5 10*3/uL (ref 0.1–1.0)
Monocytes Relative: 9 %
Neutro Abs: 3 10*3/uL (ref 1.7–7.7)
Neutrophils Relative %: 50 %
Platelets: 214 10*3/uL (ref 150–400)
RBC: 3.25 MIL/uL — ABNORMAL LOW (ref 3.87–5.11)
RDW: 14.3 % (ref 11.5–15.5)
WBC: 5.9 10*3/uL (ref 4.0–10.5)
nRBC: 0 % (ref 0.0–0.2)

## 2020-04-02 LAB — COMPREHENSIVE METABOLIC PANEL
ALT: 15 U/L (ref 0–44)
AST: 18 U/L (ref 15–41)
Albumin: 3.8 g/dL (ref 3.5–5.0)
Alkaline Phosphatase: 64 U/L (ref 38–126)
Anion gap: 8 (ref 5–15)
BUN: 24 mg/dL — ABNORMAL HIGH (ref 8–23)
CO2: 23 mmol/L (ref 22–32)
Calcium: 9.2 mg/dL (ref 8.9–10.3)
Chloride: 104 mmol/L (ref 98–111)
Creatinine, Ser: 0.93 mg/dL (ref 0.44–1.00)
GFR, Estimated: >60 mL/min (ref >60)
Glucose, Bld: 106 mg/dL — ABNORMAL HIGH (ref 70–99)
Potassium: 3.8 mmol/L (ref 3.5–5.1)
Sodium: 135 mmol/L (ref 135–145)
Total Bilirubin: 0.4 mg/dL (ref 0.3–1.2)
Total Protein: 7.9 g/dL (ref 6.5–8.1)

## 2020-04-02 NOTE — Progress Notes (Signed)
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   # 2012 LEFT BREAST CA STAGE I s/p Lumpec [T1 (0.7CM);Triple Neg; G-3]; s/p mammosite RT; No adj chemo  #  Mild Anemia/M-protein 0.8gm/dl [sep 2016]  # BRCA- 1&2 NEGATIVE [july 2016]; s/p Genetic counseling [June 2020]  # PN s/p acupuncture  --------------------------------------------------     DIAGNOSIS: breast cancer  STAGE:   I      ;GOALS: cure  CURRENT/MOST RECENT THERAPY: surveillaince     History of breast cancer  08/31/2018 Genetic Testing   Two VUS identified on the CustomNext-Cancer+RNAinsight.  CFTR p.R450I and CTNNA1 p.L402S VUS.  The CustomNext-Expanded gene panel offered by [facility] and includes sequencing and rearrangement analysis for the following 81 genes: AIP, ALK, APC*, ATM*, AXIN2, BAP1, BARD1, BLM, BMPR1A, BRCA1*, BRCA2*, BRIP1*, CDC73, CDH1*, CDK4, CDKN1B, CDKN2A, CHEK2*, CTNNA1, DICER1, FANCC, FH, FLCN, GALNT12, HOXB13, KIT, MAX, MEN1, MET, MLH1*, MRE11A, MSH2*, MSH6*, MUTYH*, NBN, NF1*, NF2, NTHL1, PALB2*, PDGFRA, PHOX2B, PMS2*, POLD1, POLE, POT1, PRKAR1A, PTCH1, PTEN*, RAD50, RAD51C*, RAD51D*, RB1, RET, SDHA, SDHAF2, SDHB, SDHC, SDHD, SMAD4, SMARCA4, SMARCB1, SMARCE1, STK11, SUFU, TMEM127, TP53*, TSC1, TSC2, VHL and XRCC2 (sequencing and deletion/duplication); CASR, CFTR, CPA1, CTRC, EGFR, MITF, PRSS1 and SPINK1 (sequencing only); EPCAM and GREM1 (deletion/duplication only). DNA and RNA analyses performed for * genes. The report date is September 01, 2018.     INTERVAL HISTORY:  A pleasant 60 year old female patient with above history of stage I right-sided triple negative breast cancer; MGUS- Is here for follow-up.  Patient denies any unusual bone pain or joint pains.  Denies any nausea vomiting.  Denies any fevers or chills.  She denies any worsening tingling numbness.  Review of Systems  Constitutional: Negative for chills, diaphoresis, fever, malaise/fatigue and weight loss.  HENT: Negative for nosebleeds and sore throat.   Eyes: Negative for double vision.  Respiratory: Negative for cough, hemoptysis, sputum production, shortness of breath and wheezing.   Cardiovascular: Negative for chest pain, palpitations, orthopnea and leg swelling.  Gastrointestinal: Negative for abdominal pain, blood in stool, constipation, diarrhea, heartburn, melena, nausea and vomiting.  Genitourinary: Negative for dysuria, frequency and urgency.  Musculoskeletal: Positive for joint pain. Negative for back pain.  Skin: Negative.  Negative for itching and rash.  Neurological: Positive for tingling. Negative for dizziness, focal weakness, weakness and headaches.  Endo/Heme/Allergies: Does not bruise/bleed easily.  Psychiatric/Behavioral: Negative for depression. The patient is not nervous/anxious and does not have insomnia.      PAST MEDICAL HISTORY :  Past Medical History:  Diagnosis Date  . Anemia   . Asthma   . BRCA negative 09/22/2014  . Breast cancer ([place]) 2012   LT LUMPECTOMY  . Breast cancer ([place]) 2016   RT LUMPECTOMY  . Breast screening, unspecified   . Constipation   . Dry eyes 2012  . Family history of breast cancer   . Family history of ovarian cancer   . Family history of prostate cancer   . GERD (gastroesophageal reflux disease)   . Hematuria   . Kidney stone   .  Lump or mass in breast   . Malignant neoplasm of breast (female), unspecified site 07/08/2014   Right breast, 5 mm, T1a,N0, triple negative; high risk for  recurrence on Mammoprint.  . Malignant neoplasm of upper-outer quadrant of female breast ([place]) 05/08/2010   Left breast: T1b, N0, M); triple negative, DCIS present. No chemotherapy. MammoSite radiation.  . Neuropathy    pt states in fingertips and feet  . Obesity, unspecified   . Pancreatic lesion   . Personal history of chemotherapy 2016   BREAST CA- Right  . 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 Personal history of radiation therapy 2012   BREAST CA - MAMMOSITE- Left  . Personal history of radiation therapy 2016   BREAST CA - MAMMOSITE- Right  . Screening for obesity   . Seasonal allergies   . Special screening for malignant neoplasms, colon   . Unspecified essential hypertension     PAST SURGICAL HISTORY :   Past Surgical History:  Procedure Laterality Date  . BREAST BIOPSY Right 2016   Invasive  . BREAST CYST ASPIRATION Left 2010  . BREAST LUMPECTOMY Left 2012   BREAST CA  . BREAST LUMPECTOMY Right 2016   INVASIVE MAMMARY CARCINOMA  . BREAST LUMPECTOMY WITH AXILLARY LYMPH NODE BIOPSY Right 08/01/2014   Procedure: BREAST LUMPECTOMY WITH AXILLARY LYMPH NODE BIOPSY;  Surgeon: Robert Bellow, MD;  Location: [facility] ORS;  Service: General;  Laterality: Right;  . BREAST MAMMOSITE  April 2012   mammosite placement and removal   . BREAST SURGERY Left 2012   left breast wide excision, sentinel node, MammoSite  . BREAST SURGERY Right 08/06/2014   Wide excision/sentinel node biopsy, T1a, N0 triple negative, MammoSite  . CESAREAN SECTION  1988  . COLONOSCOPY  October 17, 2009   Dr. Dionne Milo, normal study.  Marland Kitchen KIDNEY STONE SURGERY  2003   stones removed by Dr. Quillian Quince- stent placed/removed  . PORTACATH PLACEMENT Left 11/09/2014   Procedure: INSERTION PORT-A-CATH;  Surgeon: Robert Bellow, MD;   Location: [facility] ORS;  Service: General;  Laterality: Left;  . SENTINEL NODE BIOPSY Right 08/01/2014   Procedure: SENTINEL NODE BIOPSY;  Surgeon: Robert Bellow, MD;  Location: [facility] ORS;  Service: General;  Laterality: Right;  . TUBAL LIGATION      FAMILY HISTORY :   Family History  Problem Relation Age of Onset  . Ovarian cancer Mother   . Diabetes Mother   . Diabetes Father   . Prostate cancer Father        dx in his 48s  . Hypertension Brother   . Cancer Maternal Grandmother        bone  . Cancer Maternal Grandfather        lung  . Cancer Paternal Grandmother        lung  . Asthma Paternal Grandfather   . Hypertension Sister   . Prostate cancer Brother 18  . AAA (abdominal aortic aneurysm) Maternal Uncle   . Breast cancer Neg Hx     SOCIAL HISTORY:   Social History   Tobacco Use  . Smoking status: Never Smoker  . Smokeless tobacco: Never Used  Vaping Use  . Vaping Use: Never used  Substance Use Topics  . Alcohol use: Yes    Comment: occasionally  . Drug use:  No    ALLERGIES:  is allergic to cayenne pepper [cayenne], shellfish allergy, and tape.  MEDICATIONS:  Current Outpatient Medications  Medication Sig Dispense Refill  . albuterol (VENTOLIN HFA) 108 (90 Base) MCG/ACT inhaler USE 2 INHALATIONS BY MOUTH  EVERY 6 HOURS AS NEEDED FOR WHEEZING OR SHORTNESS OF  BREATH 26.8 g 3  . aspirin 81 MG tablet Take 81 mg by mouth daily.    . bisacodyl (DULCOLAX) 5 MG EC tablet Take 5 mg by mouth daily as needed for moderate constipation.    . Cholecalciferol (VITAMIN D3) 1000 UNITS CAPS Take 1 capsule by mouth daily.     Marland Kitchen esomeprazole (NEXIUM) 40 MG capsule Take 40 mg by mouth 2 (two) times daily as needed (acid reflux).     Marland Kitchen lisinopril (ZESTRIL) 10 MG tablet Take 1 tablet (10 mg total) by mouth daily. 90 tablet 4  . loratadine (CLARITIN) 10 MG tablet Take 10 mg by mouth daily. Reported on 04/13/2015    . polyethylene glycol (MIRALAX / GLYCOLAX) packet Take 17 g by mouth  every other day.    . Turmeric 500 MG CAPS Take 500 mg by mouth daily.     No current facility-administered medications for this visit.   Facility-Administered Medications Ordered in Other Visits  Medication Dose Route Frequency Provider Last Rate Last Admin  . sodium chloride 0.9 % injection 10 mL  10 mL Intravenous PRN Leia Alf, MD   10 mL at 11/10/14 1138    PHYSICAL EXAMINATION: ECOG PERFORMANCE STATUS: 1 - Symptomatic but completely ambulatory  BP 124/71 (BP Location: Left Arm, Patient Position: Sitting, Cuff Size: Large)   Pulse 66   Temp 97.8 F (36.6 C) (Tympanic)   Resp 16   Ht 5' 5" (1.651 m)   Wt 213 lb (96.6 kg)   LMP  (LMP Unknown)   SpO2 100%   BMI 35.45 kg/m   Filed Weights   04/02/20 1006  Weight: 213 lb (96.6 kg)    Physical Exam HENT:     Head: Normocephalic and atraumatic.     Mouth/Throat:     Pharynx: No oropharyngeal exudate.  Eyes:     Pupils: Pupils are equal, round, and reactive to light.  Cardiovascular:     Rate and Rhythm: Normal rate and regular rhythm.  Pulmonary:     Effort: No respiratory distress.     Breath sounds: No wheezing.  Abdominal:     General: Bowel sounds are normal. There is no distension.     Palpations: Abdomen is soft. There is no mass.     Tenderness: There is no abdominal tenderness. There is no guarding or rebound.  Musculoskeletal:        General: No tenderness. Normal range of motion.     Cervical back: Normal range of motion and neck supple.  Skin:    General: Skin is warm.  Neurological:     Mental Status: She is alert and oriented to person, place, and time.  Psychiatric:        Mood and Affect: Affect normal.      LABORATORY DATA:  I have reviewed the data as listed    Component Value Date/Time   NA 135 04/02/2020 0935   NA 141 09/26/2019 1109   NA 140 07/16/2011 2130   K 3.8 04/02/2020 0935   K 3.7 07/16/2011 2130   CL 104 04/02/2020 0935   CL 106 07/16/2011 2130   CO2 23 04/02/2020  0935   CO2  27 07/16/2011 2130   GLUCOSE 106 (H) 04/02/2020 0935   GLUCOSE 93 07/16/2011 2130   BUN 24 (H) 04/02/2020 0935   BUN 16 09/26/2019 1109   BUN 16 07/16/2011 2130   CREATININE 0.93 04/02/2020 0935   CREATININE 0.95 07/16/2011 2130   CALCIUM 9.2 04/02/2020 0935   CALCIUM 9.0 07/16/2011 2130   PROT 7.9 04/02/2020 0935   PROT 7.3 09/26/2019 1109   ALBUMIN 3.8 04/02/2020 0935   ALBUMIN 4.2 09/26/2019 1109   AST 18 04/02/2020 0935   ALT 15 04/02/2020 0935   ALKPHOS 64 04/02/2020 0935   BILITOT 0.4 04/02/2020 0935   BILITOT 0.5 09/26/2019 1109   GFRNONAA >60 04/02/2020 0935   GFRNONAA >60 07/16/2011 2130   GFRAA 81 09/26/2019 1109   GFRAA >60 07/16/2011 2130    No results found for: SPEP, UPEP  Lab Results  Component Value Date   WBC 5.9 04/02/2020   NEUTROABS 3.0 04/02/2020   HGB 10.5 (L) 04/02/2020   HCT 31.1 (L) 04/02/2020   MCV 95.7 04/02/2020   PLT 214 04/02/2020      Chemistry      Component Value Date/Time   NA 135 04/02/2020 0935   NA 141 09/26/2019 1109   NA 140 07/16/2011 2130   K 3.8 04/02/2020 0935   K 3.7 07/16/2011 2130   CL 104 04/02/2020 0935   CL 106 07/16/2011 2130   CO2 23 04/02/2020 0935   CO2 27 07/16/2011 2130   BUN 24 (H) 04/02/2020 0935   BUN 16 09/26/2019 1109   BUN 16 07/16/2011 2130   CREATININE 0.93 04/02/2020 0935   CREATININE 0.95 07/16/2011 2130      Component Value Date/Time   CALCIUM 9.2 04/02/2020 0935   CALCIUM 9.0 07/16/2011 2130   ALKPHOS 64 04/02/2020 0935   AST 18 04/02/2020 0935   ALT 15 04/02/2020 0935   BILITOT 0.4 04/02/2020 0935   BILITOT 0.5 09/26/2019 1109         ASSESSMENT & PLAN:   MGUS (monoclonal gammopathy of unknown significance) # MGUS-Jun 2021-1 gm/dl; reviewed natural history of MGUS; STABLE.  January 2022-M protein labs pending.  Otherwise hemoglobin stable at 10.5.  No evidence of renal function or hypercalcemia.  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  Clinically no  evidence of recurrence; April 2021- normal; Continue surveillance; stable  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  Hb 10.5-STABLE  #  Screening for osteoporosis- recommend BMD with mammo; continue VitD. [hx of falls]- discussed re: pills/shots  # DISPOSITION: will call re: BMD # mammo/BMD- in April 2022 # follow up in 6 months-MD Reva Bores- cbc/cmp/ca-27-29/MM panel;K-L panel-Dr.B           Cammie Sickle, MD 04/02/2020 2:50 PM

## 2020-04-02 NOTE — Assessment & Plan Note (Addendum)
#   MGUS-Jun 2021-1 gm/dl; reviewed natural history of MGUS; STABLE.  January 2022-M protein labs pending.  Otherwise hemoglobin stable at 10.5.  No evidence of renal function or hypercalcemia.  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  Clinically no evidence of recurrence; April 2021- normal; Continue surveillance; stable  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  Hb 10.5-STABLE  #  Screening for osteoporosis- recommend BMD with mammo; continue VitD. [hx of falls]- discussed re: pills/shots  # DISPOSITION: will call re: BMD # mammo/BMD- in April 2022 # follow up in 6 months-MD /labs- cbc/cmp/ca-27-29/MM panel;K-L panel-Dr.B

## 2020-04-02 NOTE — Assessment & Plan Note (Deleted)
#   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  Clinically no evidence of recurrence; April 2021- normal; Continue surveillance; STABLE.   # Peripheral neuropathy- sec to chemo. grade 1-2; STABLE  # chronic mild anemia-unclear etiology.  Hb 10- STABLE.   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 Trauma/fall- skeletal survey-given above MGUS.  # DISPOSITION: will call with results of x-rays # follow up in 7 months-MD /labs- cbc/cmp/ca-27-29/MM panel;K-L panel-Dr.B  

## 2020-04-03 LAB — MULTIPLE MYELOMA PANEL, SERUM
Albumin SerPl Elph-Mcnc: 3.8 g/dL (ref 2.9–4.4)
Albumin/Glob SerPl: 1.1 (ref 0.7–1.7)
Alpha 1: 0.2 g/dL (ref 0.0–0.4)
Alpha2 Glob SerPl Elph-Mcnc: 0.8 g/dL (ref 0.4–1.0)
B-Globulin SerPl Elph-Mcnc: 1 g/dL (ref 0.7–1.3)
Gamma Glob SerPl Elph-Mcnc: 1.6 g/dL (ref 0.4–1.8)
Globulin, Total: 3.6 g/dL (ref 2.2–3.9)
IgA: 158 mg/dL (ref 87–352)
IgG (Immunoglobin G), Serum: 1816 mg/dL — ABNORMAL HIGH (ref 586–1602)
IgM (Immunoglobulin M), Srm: 43 mg/dL (ref 26–217)
M Protein SerPl Elph-Mcnc: 0.9 g/dL — ABNORMAL HIGH
Total Protein ELP: 7.4 g/dL (ref 6.0–8.5)

## 2020-04-03 LAB — KAPPA/LAMBDA LIGHT CHAINS
Kappa free light chain: 25.2 mg/L — ABNORMAL HIGH (ref 3.3–19.4)
Kappa, lambda light chain ratio: 1.91 — ABNORMAL HIGH (ref 0.26–1.65)
Lambda free light chains: 13.2 mg/L (ref 5.7–26.3)

## 2020-04-03 LAB — CANCER ANTIGEN 27.29: CA 27.29: 12.6 U/mL (ref 0.0–38.6)

## 2020-04-09 ENCOUNTER — Ambulatory Visit: Payer: Managed Care, Other (non HMO) | Admitting: Nurse Practitioner

## 2020-04-16 ENCOUNTER — Other Ambulatory Visit: Payer: Self-pay

## 2020-04-16 ENCOUNTER — Ambulatory Visit: Payer: Managed Care, Other (non HMO) | Admitting: Nurse Practitioner

## 2020-04-16 ENCOUNTER — Encounter: Payer: Self-pay | Admitting: Nurse Practitioner

## 2020-04-16 VITALS — BP 118/70 | HR 71 | Temp 98.0°F | Ht 66.38 in | Wt 217.8 lb

## 2020-04-16 DIAGNOSIS — Z853 Personal history of malignant neoplasm of breast: Secondary | ICD-10-CM

## 2020-04-16 DIAGNOSIS — E7800 Pure hypercholesterolemia, unspecified: Secondary | ICD-10-CM

## 2020-04-16 DIAGNOSIS — D472 Monoclonal gammopathy: Secondary | ICD-10-CM

## 2020-04-16 DIAGNOSIS — I10 Essential (primary) hypertension: Secondary | ICD-10-CM | POA: Diagnosis not present

## 2020-04-16 DIAGNOSIS — R21 Rash and other nonspecific skin eruption: Secondary | ICD-10-CM | POA: Insufficient documentation

## 2020-04-16 DIAGNOSIS — Z6835 Body mass index (BMI) 35.0-35.9, adult: Secondary | ICD-10-CM

## 2020-04-16 DIAGNOSIS — J4520 Mild intermittent asthma, uncomplicated: Secondary | ICD-10-CM

## 2020-04-16 DIAGNOSIS — E559 Vitamin D deficiency, unspecified: Secondary | ICD-10-CM | POA: Insufficient documentation

## 2020-04-16 MED ORDER — NYSTATIN 100000 UNIT/GM EX CREA
1.0000 | TOPICAL_CREAM | Freq: Two times a day (BID) | CUTANEOUS | 1 refills | Status: DC
Start: 2020-04-16 — End: 2020-10-01

## 2020-04-16 MED ORDER — TRIAMCINOLONE ACETONIDE 0.1 % EX CREA
1.0000 | TOPICAL_CREAM | Freq: Two times a day (BID) | CUTANEOUS | 0 refills | Status: DC
Start: 2020-04-16 — End: 2020-05-07

## 2020-04-16 NOTE — Assessment & Plan Note (Signed)
To back of knees, will alternate Traimcinolone and Nystatin cream.  Return for worsening or ongoing.

## 2020-04-16 NOTE — Progress Notes (Signed)
BP 118/70   Pulse 71   Temp 98 F (36.7 C)   Ht 5' 6.38" (1.686 m)   Wt 217 lb 12.8 oz (98.8 kg)   LMP  (LMP Unknown)   SpO2 98%   BMI 34.75 kg/m    Subjective:    Patient ID: Adrienne [name], female    DOB: 12-15-1960, 60 y.o.   MRN: [id number]  HPI: Adrienne [name] is a 60 y.o. female  Chief Complaint  Patient presents with  . Hypertension  . Hyperlipidemia  . Rash    On the back of both her knees   HYPERTENSION / HYPERLIPIDEMIA Continues on Lisinopril and no current statin.  Continues on Vitamin D supplement daily due to past history low levels -- last level 30.1 in 2020. Satisfied with current treatment? yes Duration of hypertension: chronic BP monitoring frequency: daily BP range:  110-120/60-70 BP medication side effects: no Duration of hyperlipidemia: chronic Aspirin: no Recent stressors: no Recurrent headaches: no Visual changes: no Palpitations: no Dyspnea: no Chest pain: no Lower extremity edema: no Dizzy/lightheaded: no  The 10-year ASCVD risk score Mikey Bussing DC Jr., et al., 2013) is: 5.5%   Values used to calculate the score:     Age: 47 years     Sex: Female     Is Non-Hispanic African American: Yes     Diabetic: No     Tobacco smoker: No     Systolic Blood Pressure: 646 mmHg     Is BP treated: Yes     HDL Cholesterol: 63 mg/dL     Total Cholesterol: 189 mg/dL   ASTHMA Has Albuterol as needed -- once a month or less uses it. Asthma status: stable Satisfied with current treatment?: yes Albuterol/rescue inhaler frequency: once a month or less Dyspnea frequency: none Wheezing frequency: none Cough frequency: none Nocturnal symptom frequency: none Limitation of activity: no Current upper respiratory symptoms: no Triggers: smoke  Home peak flows:none Last Spirometry: unknown Failed/intolerant to following asthma meds:  Asthma meds in past:  Aerochamber/spacer use: no Visits to ER or Urgent Care in past year: no Pneumovax: Up to  Date Influenza: Up to Date  MGUS & HISTORY OF BREAST CANCER: Followed by Dr. Rogue Bussing with oncology and had recent visit on 04/02/2020 -- he ordered DEXA scan and breast mammogram.  Has history of breast cancer with chemo and radiation -- last treatments in 2018.    RASH To back of legs -- been present x one month.  Sees Dr. Brendolyn Patty for dermatology, not seen recently.  Has Cortisol and Sarna lotion at home with no success. Duration:  weeks  Location: back of legs  Itching: yes Burning: yes Redness: yes Oozing: no Scaling: yes Blisters: no Painful: no Fevers: no Change in detergents/soaps/personal care products: no Recent illness: no Recent travel:no History of same: yes -- eczema flares since chemo Context: stable Alleviating factors: lotion/moisturizer Treatments attempted:hydrocortisone cream Shortness of breath: no  Throat/tongue swelling: no Myalgias/arthralgias: no  Relevant past medical, surgical, family and social history reviewed and updated as indicated. Interim medical history since our last visit reviewed. Allergies and medications reviewed and updated.  Review of Systems  Constitutional: Negative for activity change, appetite change, diaphoresis, fatigue and fever.  Respiratory: Negative for cough, chest tightness and shortness of breath.   Cardiovascular: Negative for chest pain, palpitations and leg swelling.  Gastrointestinal: Negative.   Endocrine: Negative.   Skin: Positive for rash.  Neurological: Negative.   Psychiatric/Behavioral: Negative.  Per HPI unless specifically indicated above     Objective:    BP 118/70   Pulse 71   Temp 98 F (36.7 C)   Ht 5' 6.38" (1.686 m)   Wt 217 lb 12.8 oz (98.8 kg)   LMP  (LMP Unknown)   SpO2 98%   BMI 34.75 kg/m   Wt Readings from Last 3 Encounters:  04/16/20 217 lb 12.8 oz (98.8 kg)  04/02/20 213 lb (96.6 kg)  01/09/20 208 lb (94.3 kg)    Physical Exam Vitals and nursing note reviewed.   Constitutional:      General: She is awake. She is not in acute distress.    Appearance: She is well-developed and well-groomed. She is obese. She is not ill-appearing.  HENT:     Head: Normocephalic.     Right Ear: Hearing normal.     Left Ear: Hearing normal.  Eyes:     General: Lids are normal.        Right eye: No discharge.        Left eye: No discharge.     Conjunctiva/sclera: Conjunctivae normal.     Pupils: Pupils are equal, round, and reactive to light.  Neck:     Thyroid: No thyromegaly.     Vascular: No carotid bruit.  Cardiovascular:     Rate and Rhythm: Normal rate and regular rhythm.     Heart sounds: Normal heart sounds. No murmur heard. No gallop.   Pulmonary:     Effort: Pulmonary effort is normal. No accessory muscle usage or respiratory distress.     Breath sounds: Normal breath sounds.  Abdominal:     General: Bowel sounds are normal.     Palpations: Abdomen is soft. There is no hepatomegaly or splenomegaly.  Musculoskeletal:     Cervical back: Normal range of motion and neck supple.     Right lower leg: No edema.     Left lower leg: No edema.  Skin:    General: Skin is warm and dry.     Findings: Rash present. Rash is macular and papular.     Comments: Noted to posterior knee bilaterally, scattered macu-papular rash, raised, diffuse with mild erythema.  No warmth or tenderness.  No scaling.  No blisters.  Neurological:     Mental Status: She is alert and oriented to person, place, and time.  Psychiatric:        Attention and Perception: Attention normal.        Mood and Affect: Mood normal.        Speech: Speech normal.        Behavior: Behavior normal. Behavior is cooperative.        Thought Content: Thought content normal.     Results for orders placed or performed in visit on 04/02/20  Kappa/lambda light chains  Result Value Ref Range   Kappa free light chain 25.2 (H) 3.3 - 19.4 mg/L   Lamda free light chains 13.2 5.7 - 26.3 mg/L   Kappa,  lamda light chain ratio 1.91 (H) 0.26 - 1.65  Multiple Myeloma Panel (SPEP&IFE w/QIG)  Result Value Ref Range   IgG (Immunoglobin G), Serum 1,816 (H) 586 - 1,602 mg/dL   IgA 158 87 - 352 mg/dL   IgM (Immunoglobulin M), Srm 43 26 - 217 mg/dL   Total Protein ELP 7.4 6.0 - 8.5 g/dL   Albumin SerPl Elph-Mcnc 3.8 2.9 - 4.4 g/dL   Alpha 1 0.2 0.0 - 0.4 g/dL     Alpha2 Glob SerPl Elph-Mcnc 0.8 0.4 - 1.0 g/dL   B-Globulin SerPl Elph-Mcnc 1.0 0.7 - 1.3 g/dL   Gamma Glob SerPl Elph-Mcnc 1.6 0.4 - 1.8 g/dL   M Protein SerPl Elph-Mcnc 0.9 (H) Not Observed g/dL   Globulin, Total 3.6 2.2 - 3.9 g/dL   Albumin/Glob SerPl 1.1 0.7 - 1.7   IFE 1 Comment (A)    Please Note Comment   Cancer antigen 27.29  Result Value Ref Range   CA 27.29 12.6 0.0 - 38.6 U/mL  Comprehensive metabolic panel  Result Value Ref Range   Sodium 135 135 - 145 mmol/L   Potassium 3.8 3.5 - 5.1 mmol/L   Chloride 104 98 - 111 mmol/L   CO2 23 22 - 32 mmol/L   Glucose, Bld 106 (H) 70 - 99 mg/dL   BUN 24 (H) 8 - 23 mg/dL   Creatinine, Ser 0.93 0.44 - 1.00 mg/dL   Calcium 9.2 8.9 - 10.3 mg/dL   Total Protein 7.9 6.5 - 8.1 g/dL   Albumin 3.8 3.5 - 5.0 g/dL   AST 18 15 - 41 U/L   ALT 15 0 - 44 U/L   Alkaline Phosphatase 64 38 - 126 U/L   Total Bilirubin 0.4 0.3 - 1.2 mg/dL   GFR, Estimated >60 >60 mL/min   Anion gap 8 5 - 15  CBC with Differential/Platelet  Result Value Ref Range   WBC 5.9 4.0 - 10.5 K/uL   RBC 3.25 (L) 3.87 - 5.11 MIL/uL   Hemoglobin 10.5 (L) 12.0 - 15.0 g/dL   HCT 31.1 (L) 36.0 - 46.0 %   MCV 95.7 80.0 - 100.0 fL   MCH 32.3 26.0 - 34.0 pg   MCHC 33.8 30.0 - 36.0 g/dL   RDW 14.3 11.5 - 15.5 %   Platelets 214 150 - 400 K/uL   nRBC 0.0 0.0 - 0.2 %   Neutrophils Relative % 50 %   Neutro Abs 3.0 1.7 - 7.7 K/uL   Lymphocytes Relative 37 %   Lymphs Abs 2.2 0.7 - 4.0 K/uL   Monocytes Relative 9 %   Monocytes Absolute 0.5 0.1 - 1.0 K/uL   Eosinophils Relative 3 %   Eosinophils Absolute 0.2 0.0 - 0.5 K/uL    Basophils Relative 1 %   Basophils Absolute 0.0 0.0 - 0.1 K/uL   Immature Granulocytes 0 %   Abs Immature Granulocytes 0.02 0.00 - 0.07 K/uL      Assessment & Plan:   Problem List Items Addressed This Visit      Cardiovascular and Mediastinum   Hypertension    Chronic, stable with BP at goal on recheck and on home BP readings.  Continue current medication regimen and adjust as needed.  Recommend she continue to monitor BP regularly at home and document + focus on DASH diet.  BMP today.  Return to office in 6 months for physical.      Relevant Orders   Basic Metabolic Panel (BMET)     Respiratory   Asthma    Chronic, stable with minimal use of inhaler.  Continue current medication regimen and adjust as needed.  Spirometry next visit.        Musculoskeletal and Integument   Rash    To back of knees, will alternate Traimcinolone and Nystatin cream.  Return for worsening or ongoing.        Other   History of breast cancer    Continue to collaborate with oncology and review notes.  MGUS (monoclonal gammopathy of unknown significance)    Continue collaboration with oncology, recent notes reviewed.  Patient denies any pain today.      Elevated LDL cholesterol level    Noted on recent labs at 115 and ASCVD 5.5%.  Continue diet focus, provided education on this.  Lipid panel today.  Initiate medication as needed.      Relevant Orders   Lipid Profile   Class 2 severe obesity due to excess calories with serious comorbidity and body mass index (BMI) of 35.0 to 35.9 in adult (HCC) - Primary    BMI 34.5 today.  Recommended eating smaller high protein, low fat meals more frequently and exercising 30 mins a day 5 times a week with a goal of 10-15lb weight loss in the next 3 months. Patient voiced their understanding and motivation to adhere to these recommendations.       Vitamin D deficiency    Chronic, stable with recent levels at goal.  Continue supplement and check level  today.      Relevant Orders   VITAMIN D 25 Hydroxy (Vit-D Deficiency, Fractures)       Follow up plan: Return in about 5 months (around 09/26/2020) for Annual physical -- with spirometry. 

## 2020-04-16 NOTE — Patient Instructions (Signed)
Preventing High Cholesterol Cholesterol is a white, waxy substance similar to fat that the human body needs to help build cells. The liver makes all the cholesterol that a person's body needs. Having high cholesterol (hypercholesterolemia) increases your risk for heart disease and stroke. Extra or excess cholesterol comes from the food that you eat. High cholesterol can often be prevented with diet and lifestyle changes. If you already have high cholesterol, you can control it with diet, lifestyle changes, and medicines. How can high cholesterol affect me? If you have high cholesterol, fatty deposits (plaques) may build up on the walls of your blood vessels. The blood vessels that carry blood away from your heart are called arteries. Plaques make the arteries narrower and stiffer. This in turn can:  Restrict or block blood flow and cause blood clots to form.  Increase your risk for heart attack and stroke. What can increase my risk for high cholesterol? This condition is more likely to develop in people who:  Eat foods that are high in saturated fat or cholesterol. Saturated fat is mostly found in foods that come from animal sources.  Are overweight.  Are not getting enough exercise.  Have a family history of high cholesterol (familial hypercholesterolemia). What actions can I take to prevent this? Nutrition  Eat less saturated fat.  Avoid trans fats (partially hydrogenated oils). These are often found in margarine and in some baked goods, fried foods, and snacks bought in packages.  Avoid precooked or cured meat, such as bacon, sausages, or meat loaves.  Avoid foods and drinks that have added sugars.  Eat more fruits, vegetables, and whole grains.  Choose healthy sources of protein, such as fish, poultry, lean cuts of red meat, beans, peas, lentils, and nuts.  Choose healthy sources of fat, such as: ? Nuts. ? Vegetable oils, especially olive oil. ? Fish that have healthy fats,  such as omega-3 fatty acids. These fish include mackerel or salmon.   Lifestyle  Lose weight if you are overweight. Maintaining a healthy body mass index (BMI) can help prevent or control high cholesterol. It can also lower your risk for diabetes and high blood pressure. Ask your health care provider to help you with a diet and exercise plan to lose weight safely.  Do not use any products that contain nicotine or tobacco, such as cigarettes, e-cigarettes, and chewing tobacco. If you need help quitting, ask your health care provider. Alcohol use  Do not drink alcohol if: ? Your health care provider tells you not to drink. ? You are pregnant, may be pregnant, or are planning to become pregnant.  If you drink alcohol: ? Limit how much you use to:  0-1 drink a day for women.  0-2 drinks a day for men. ? Be aware of how much alcohol is in your drink. In the [place] one drink equals one 12 oz bottle of beer (355 mL), one 5 oz glass of wine (148 mL), or one 1 oz glass of hard liquor (44 mL). Activity  Get enough exercise. Do exercises as told by your health care provider.  Each week, do at least 150 minutes of exercise that takes a medium level of effort (moderate-intensity exercise). This kind of exercise: ? Makes your heart beat faster while allowing you to still be able to talk. ? Can be done in short sessions several times a day or longer sessions a few times a week. For example, on 5 days each week, you could walk fast or ride   your bike 3 times a day for 10 minutes each time.   Medicines  Your health care provider may recommend medicines to help lower cholesterol. This may be a medicine to lower the amount of cholesterol that your liver makes. You may need medicine if: ? Diet and lifestyle changes have not lowered your cholesterol enough. ? You have high cholesterol and other risk factors for heart disease or stroke.  Take over-the-counter and prescription medicines only as told by your  health care provider. General information  Manage your risk factors for high cholesterol. Talk with your health care provider about all your risk factors and how to lower your risk.  Manage other conditions that you have, such as diabetes or high blood pressure (hypertension).  Have blood tests to check your cholesterol levels at regular points in time as told by your health care provider.  Keep all follow-up visits as told by your health care provider. This is important. Where to find more information  American Heart Association: www.heart.org  National Heart, Lung, and Blood Institute: www.nhlbi.nih.gov Summary  High cholesterol increases your risk for heart disease and stroke. By keeping your cholesterol level low, you can reduce your risk for these conditions.  High cholesterol can often be prevented with diet and lifestyle changes.  Work with your health care provider to manage your risk factors, and have your blood tested regularly. This information is not intended to replace advice given to you by your health care provider. Make sure you discuss any questions you have with your health care provider. Document Revised: 12/07/2018 Document Reviewed: 12/07/2018 Elsevier Patient Education  2021 Elsevier Inc.  

## 2020-04-16 NOTE — Assessment & Plan Note (Signed)
Chronic, stable with recent levels at goal.  Continue supplement and check level today. 

## 2020-04-16 NOTE — Assessment & Plan Note (Signed)
BMI 34.5 today.  Recommended eating smaller high protein, low fat meals more frequently and exercising 30 mins a day 5 times a week with a goal of 10-15lb weight loss in the next 3 months. Patient voiced their understanding and motivation to adhere to these recommendations.

## 2020-04-16 NOTE — Assessment & Plan Note (Signed)
Chronic, stable with minimal use of inhaler.  Continue current medication regimen and adjust as needed.  Spirometry next visit.

## 2020-04-16 NOTE — Assessment & Plan Note (Signed)
Chronic, stable with BP at goal on recheck and on home BP readings.  Continue current medication regimen and adjust as needed.  Recommend she continue to monitor BP regularly at home and document + focus on DASH diet.  BMP today.  Return to office in 6 months for physical.

## 2020-04-16 NOTE — Assessment & Plan Note (Signed)
Continue to collaborate with oncology and review notes. 

## 2020-04-16 NOTE — Assessment & Plan Note (Signed)
Noted on recent labs at 115 and ASCVD 5.5%.  Continue diet focus, provided education on this.  Lipid panel today.  Initiate medication as needed.

## 2020-04-16 NOTE — Assessment & Plan Note (Signed)
Continue collaboration with oncology, recent notes reviewed.  Patient denies any pain today. 

## 2020-04-17 ENCOUNTER — Other Ambulatory Visit: Payer: Self-pay | Admitting: Nurse Practitioner

## 2020-04-17 LAB — BASIC METABOLIC PANEL
BUN/Creatinine Ratio: 20 (ref 12–28)
BUN: 18 mg/dL (ref 8–27)
CO2: 22 mmol/L (ref 20–29)
Calcium: 9.3 mg/dL (ref 8.7–10.3)
Chloride: 103 mmol/L (ref 96–106)
Creatinine, Ser: 0.89 mg/dL (ref 0.57–1.00)
GFR calc Af Amer: 80 mL/min/{1.73_m2} (ref >59)
GFR calc non Af Amer: 70 mL/min/{1.73_m2} (ref >59)
Glucose: 100 mg/dL — ABNORMAL HIGH (ref 65–99)
Potassium: 4.2 mmol/L (ref 3.5–5.2)
Sodium: 138 mmol/L (ref 134–144)

## 2020-04-17 LAB — LIPID PANEL
Chol/HDL Ratio: 3 ratio (ref 0.0–4.4)
Cholesterol, Total: 200 mg/dL — ABNORMAL HIGH (ref 100–199)
HDL: 66 mg/dL (ref >39)
LDL Chol Calc (NIH): 124 mg/dL — ABNORMAL HIGH (ref 0–99)
Triglycerides: 52 mg/dL (ref 0–149)
VLDL Cholesterol Cal: 10 mg/dL (ref 5–40)

## 2020-04-17 MED ORDER — ATORVASTATIN CALCIUM 10 MG PO TABS: 10.0000 mg | ORAL_TABLET | Freq: Every day | ORAL | 3 refills | Status: DC

## 2020-04-17 NOTE — Progress Notes (Signed)
Contacted via MyChart The 10-year ASCVD risk score Mikey Bussing DC Jr., et al., 2013) is: 5.7%   Values used to calculate the score:     Age: 60 years     Sex: Female     Is Non-Hispanic African American: Yes     Diabetic: No     Tobacco smoker: No     Systolic Blood Pressure: 025 mmHg     Is BP treated: Yes     HDL Cholesterol: 66 mg/dL     Total Cholesterol: 200 mg/dL  Good morning Ms. Ohaver, your labs have returned.  Kidney function is stable.  Your cholesterol is still high, but continued recommendations to make lifestyle changes. Your LDL is above normal. The LDL is the bad cholesterol. Over time and in combination with inflammation and other factors, this contributes to plaque which in turn may lead to stroke and/or heart attack down the road. Sometimes high LDL is primarily genetic, and people might be eating all the right foods but still have high numbers. Other times, there is room for improvement in one's diet and eating healthier can bring this number down and potentially reduce one's risk of heart attack and/or stroke.   To reduce your LDL, Remember - more fruits and vegetables, more fish, and limit red meat and dairy products. More soy, nuts, beans, barley, lentils, oats and plant sterol ester enriched margarine instead of butter. I also encourage eliminating sugar and processed food. Remember, shop on the outside of the grocery store and visit your [employer]. If you would like to talk with me about dietary changes plus or minus medications for your cholesterol, please let me know. We should recheck your cholesterol in 6 months.  Any questions? Keep being awesome!!  Thank you for allowing me to participate in your care. Kindest regards, Emma Birchler

## 2020-05-05 ENCOUNTER — Encounter: Payer: Self-pay | Admitting: Nurse Practitioner

## 2020-05-07 ENCOUNTER — Other Ambulatory Visit: Payer: Self-pay

## 2020-05-07 ENCOUNTER — Other Ambulatory Visit: Payer: Self-pay | Admitting: Nurse Practitioner

## 2020-05-07 ENCOUNTER — Encounter: Payer: Self-pay | Admitting: Nurse Practitioner

## 2020-05-07 ENCOUNTER — Ambulatory Visit: Payer: Managed Care, Other (non HMO) | Admitting: Nurse Practitioner

## 2020-05-07 VITALS — BP 128/82 | HR 99 | Temp 98.2°F | Wt 213.2 lb

## 2020-05-07 DIAGNOSIS — R21 Rash and other nonspecific skin eruption: Secondary | ICD-10-CM

## 2020-05-07 MED ORDER — VALACYCLOVIR HCL 1 G PO TABS
1000.0000 mg | ORAL_TABLET | Freq: Three times a day (TID) | ORAL | 0 refills | Status: AC
Start: 2020-05-07 — End: 2020-05-14

## 2020-05-07 MED ORDER — TRIAMCINOLONE ACETONIDE 40 MG/ML IJ SUSP
40.0000 mg | Freq: Once | INTRAMUSCULAR | Status: AC
  Administered 2020-05-07: 40 mg via INTRAMUSCULAR

## 2020-05-07 MED ORDER — TRIAMCINOLONE ACETONIDE 0.1 % EX CREA: 1.0000 "application " | TOPICAL_CREAM | Freq: Two times a day (BID) | CUTANEOUS | 1 refills | Status: DC

## 2020-05-07 NOTE — Progress Notes (Signed)
BP 128/82   Pulse 99   Temp 98.2 F (36.8 C) (Oral)   Wt 213 lb 3.2 oz (96.7 kg)   LMP  (LMP Unknown)   SpO2 98%   BMI 34.02 kg/m    Subjective:    Patient ID: Adrienne [name], female    DOB: 18-Mar-1960, 60 y.o.   MRN: [id number]  HPI: Adrienne [name] is a 60 y.o. female  Chief Complaint  Patient presents with  . Rash    Patient states the rash had gotten worse and states the steroids didn't help her at all. Patient states maybe if she would have gotten the pill form instead of the shot, maybe she would get better. Patient states in her L eye is swollen and her back is scratches up and states the rash is traveling all over her body.   RASH Originally seen on 04/16/20 for this and treated with Triamcinolone and Nystatin.  To back of legs initially -- been present x two months.  Sees Dr. Brendolyn Patty for dermatology, can not see her until the 19th.  Had tried Cortisol and Sarna lotion at home with no success.  Only new medication recently was Atorvastatin, which we stopped 4-5 days ago.  Never had allergy testing in past.    Current rash she reports is totally different then what she was originally seen for on the 7th, this has spread.  Has had shingles vaccinations, last 03/25/2019.  Did go to [facility] recently for this one and had steroid injection. Duration:  weeks  Location: back Itching: yes Burning: yes Redness: yes Oozing: no Scaling: yes Blisters: initially Painful: occasionally Fevers: no Change in detergents/soaps/personal care products: no Recent illness: no Recent travel:no History of same: yes -- eczema flares since chemo Context: stable Alleviating factors: lotion/moisturizer Treatments attempted:hydrocortisone cream Shortness of breath: no  Throat/tongue swelling: no Myalgias/arthralgias: no  Relevant past medical, surgical, family and social history reviewed and updated as indicated. Interim medical history since our last visit reviewed. Allergies and  medications reviewed and updated.  Review of Systems  Constitutional: Negative for activity change, appetite change, diaphoresis, fatigue and fever.  Respiratory: Negative for cough, chest tightness and shortness of breath.   Cardiovascular: Negative for chest pain, palpitations and leg swelling.  Gastrointestinal: Negative.   Endocrine: Negative.   Skin: Positive for rash.  Neurological: Negative.   Psychiatric/Behavioral: Negative.     Per HPI unless specifically indicated above     Objective:    BP 128/82   Pulse 99   Temp 98.2 F (36.8 C) (Oral)   Wt 213 lb 3.2 oz (96.7 kg)   LMP  (LMP Unknown)   SpO2 98%   BMI 34.02 kg/m   Wt Readings from Last 3 Encounters:  05/07/20 213 lb 3.2 oz (96.7 kg)  04/16/20 217 lb 12.8 oz (98.8 kg)  04/02/20 213 lb (96.6 kg)    Physical Exam Vitals and nursing note reviewed.  Constitutional:      General: She is awake. She is not in acute distress.    Appearance: She is well-developed and well-groomed. She is obese. She is not ill-appearing.  HENT:     Head: Normocephalic.     Right Ear: Hearing normal.     Left Ear: Hearing normal.  Eyes:     General: Lids are normal.        Right eye: No discharge.        Left eye: No discharge.     Extraocular Movements:  Extraocular movements intact.     Conjunctiva/sclera:     Right eye: Right conjunctiva is injected.     Pupils: Pupils are equal, round, and reactive to light.     Visual Fields: Right eye visual fields normal and left eye visual fields normal.     Comments: Left eye with edema to upper lid and around exterior.  No rashes or erythema.  Neck:     Thyroid: No thyromegaly.     Vascular: No carotid bruit.  Cardiovascular:     Rate and Rhythm: Normal rate and regular rhythm.     Heart sounds: Normal heart sounds. No murmur heard. No gallop.   Pulmonary:     Effort: Pulmonary effort is normal. No accessory muscle usage or respiratory distress.     Breath sounds: Normal breath  sounds.  Abdominal:     General: Bowel sounds are normal.     Palpations: Abdomen is soft. There is no hepatomegaly or splenomegaly.  Musculoskeletal:     Cervical back: Normal range of motion and neck supple.     Right lower leg: No edema.     Left lower leg: No edema.  Skin:    General: Skin is warm and dry.     Findings: Rash present. Rash is scaling.     Comments: Noted to entire back, midline and bilateral with Calamine lotion coating.  Area with crusted over dry patchy clusters, with scaling to some areas. No open vesicles or erythema.  Picture on patients phone noted clusters of small vesicles present when 1st presented.  Neurological:     Mental Status: She is alert and oriented to person, place, and time.  Psychiatric:        Attention and Perception: Attention normal.        Mood and Affect: Mood normal.        Speech: Speech normal.        Behavior: Behavior normal. Behavior is cooperative.        Thought Content: Thought content normal.    Results for orders placed or performed in visit on 04/16/20  Lipid Profile  Result Value Ref Range   Cholesterol, Total 200 (H) 100 - 199 mg/dL   Triglycerides 52 0 - 149 mg/dL   HDL 66 >39 mg/dL   VLDL Cholesterol Cal 10 5 - 40 mg/dL   LDL Chol Calc (NIH) 124 (H) 0 - 99 mg/dL   Chol/HDL Ratio 3.0 0.0 - 4.4 ratio  Basic Metabolic Panel (BMET)  Result Value Ref Range   Glucose 100 (H) 65 - 99 mg/dL   BUN 18 8 - 27 mg/dL   Creatinine, Ser 0.89 0.57 - 1.00 mg/dL   GFR calc non Af Amer 70 >59 mL/min/1.73   GFR calc Af Amer 80 >59 mL/min/1.73   BUN/Creatinine Ratio 20 12 - 28   Sodium 138 134 - 144 mmol/L   Potassium 4.2 3.5 - 5.2 mmol/L   Chloride 103 96 - 106 mmol/L   CO2 22 20 - 29 mmol/L   Calcium 9.3 8.7 - 10.3 mg/dL      Assessment & Plan:   Problem List Items Addressed This Visit      Musculoskeletal and Integument   Rash - Primary    To all over back, ? Shingles type rash.  Initially presented with vesicles.  Will  treat with Valtrex TID x 7 days and continue Triamcinolone cream applications.  Follow-up with dermatology as scheduled on 16th and with PCP in one  week.  Recommend she call her ophthalmologist, [facility], immediately after leaving office to have eye assessed -- discussed with her shingles and eye issues.  She reports she will call and get seen ASAP.  If any worsening symptoms return to office immediately.  Kenalog shot in office today. Discussed rash and plan with Dr. Wynetta Emery, she viewed rash as well.          Follow up plan: Return in about 1 week (around 05/14/2020) for Rash.

## 2020-05-07 NOTE — Patient Instructions (Signed)
Shingles  Shingles is an infection. It gives you a painful skin rash and blisters that have fluid in them. Shingles is caused by the same germ (virus) that causes chickenpox. Shingles only happens in people who:  Have had chickenpox.  Have been given a shot of medicine (vaccine) to protect against chickenpox. Shingles is rare in this group. The first symptoms of shingles may be itching, tingling, or pain in an area on your skin. A rash will show on your skin a few days or weeks later. The rash is likely to be on one side of your body. The rash usually has a shape like a belt or a band. Over time, the rash turns into fluid-filled blisters. The blisters will break open, change into scabs, and dry up. Medicines may:  Help with pain and itching.  Help you get better sooner.  Help to prevent long-term problems. Follow these instructions at home: Medicines  Take over-the-counter and prescription medicines only as told by your doctor.  Put on an anti-itch cream or numbing cream where you have a rash, blisters, or scabs. Do this as told by your doctor. Helping with itching and discomfort  Put cold, wet cloths (cold compresses) on the area of the rash or blisters as told by your doctor.  Cool baths can help you feel better. Try adding baking soda or dry oatmeal to the water to lessen itching. Do not bathe in hot water.   Blister and rash care  Keep your rash covered with a loose bandage (dressing).  Wear loose clothing that does not rub on your rash.  Keep your rash and blisters clean. To do this, wash the area with mild soap and cool water as told by your doctor.  Check your rash every day for signs of infection. Check for: ? More redness, swelling, or pain. ? Fluid or blood. ? Warmth. ? Pus or a bad smell.  Do not scratch your rash. Do not pick at your blisters. To help you to not scratch: ? Keep your fingernails clean and cut short. ? Wear gloves or mittens when you sleep, if  scratching is a problem. General instructions  Rest as told by your doctor.  Keep all follow-up visits as told by your doctor. This is important.  Wash your hands often with soap and water. If soap and water are not available, use hand sanitizer. Doing this lowers your chance of getting a skin infection caused by germs (bacteria).  Your infection can cause chickenpox in people who have never had chickenpox or never got a shot of chickenpox vaccine. If you have blisters that did not change into scabs yet, try not to touch other people or be around other people, especially: ? Babies. ? Pregnant women. ? Children who have areas of red, itchy, or rough skin (eczema). ? Very old people who have transplants. ? People who have a long-term (chronic) sickness, like cancer or AIDS. Contact a doctor if:  Your pain does not get better with medicine.  Your pain does not get better after the rash heals.  You have any signs of infection in the rash area. These signs include: ? More redness, swelling, or pain around the rash. ? Fluid or blood coming from the rash. ? The rash area feeling warm to the touch. ? Pus or a bad smell coming from the rash. Get help right away if:  The rash is on your face or nose.  You have pain in your face or pain  by your eye.  You lose feeling on one side of your face.  You have trouble seeing.  You have ear pain, or you have ringing in your ear.  You have a loss of taste.  Your condition gets worse. Summary  Shingles gives you a painful skin rash and blisters that have fluid in them.  Shingles is an infection. It is caused by the same germ (virus) that causes chickenpox.  Keep your rash covered with a loose bandage (dressing). Wear loose clothing that does not rub on your rash.  If you have blisters that did not change into scabs yet, try not to touch other people or be around people. This information is not intended to replace advice given to you by  your health care provider. Make sure you discuss any questions you have with your health care provider. Document Revised: 06/18/2018 Document Reviewed: 10/29/2016 Elsevier Patient Education  2021 [employer].

## 2020-05-07 NOTE — Assessment & Plan Note (Addendum)
To all over back, ? Shingles type rash.  Initially presented with vesicles.  Will treat with Valtrex TID x 7 days and continue Triamcinolone cream applications.  Follow-up with dermatology as scheduled on 16th and with PCP in one week.  Recommend she call her ophthalmologist, [facility], immediately after leaving office to have eye assessed -- discussed with her shingles and eye issues.  She reports she will call and get seen ASAP.  If any worsening symptoms return to office immediately.  Kenalog shot in office today. Discussed rash and plan with Dr. Wynetta Emery, she viewed rash as well.

## 2020-05-23 ENCOUNTER — Other Ambulatory Visit: Payer: Self-pay

## 2020-05-23 ENCOUNTER — Ambulatory Visit: Payer: Managed Care, Other (non HMO) | Admitting: Dermatology

## 2020-05-23 DIAGNOSIS — L309 Dermatitis, unspecified: Secondary | ICD-10-CM

## 2020-05-23 MED ORDER — CLOBETASOL PROPIONATE 0.05 % EX SOLN
1.0000 | Freq: Every day | CUTANEOUS | 1 refills | Status: DC
Start: 2020-05-23 — End: 2021-04-22

## 2020-05-23 MED ORDER — DUPIXENT 300 MG/2ML ~~LOC~~ SOAJ: 300.0000 mg | SUBCUTANEOUS | 6 refills | Status: DC

## 2020-05-23 MED ORDER — TACROLIMUS 0.1 % EX OINT: TOPICAL_OINTMENT | CUTANEOUS | 2 refills | Status: DC

## 2020-05-23 MED ORDER — DUPIXENT 300 MG/2ML ~~LOC~~ SOAJ: 600.0000 mg | Freq: Once | SUBCUTANEOUS | 0 refills | Status: DC

## 2020-05-23 NOTE — Patient Instructions (Addendum)
Eczema Skin Care  Buy TWO 16oz jars of CeraVe moisturizing cream  [facility], [facility], [facility] (no prescription needed)  Costs about $15 per jar   Jar #1: Use as a moisturizer as needed. Can be applied to any area of the body. Use twice daily to unaffected areas.  Jar #2: Pour one 25ml bottle of clobetasol 0.05% solution into jar, mix well. Label this jar to indicate the medication has been added. Use twice daily to affected areas. Do not apply to face, groin or underarms.  Moisturizer may burn or sting initially. Try for at least 4 weeks.     Dry Skin Care  What causes dry skin?  Dry skin is common and results from inadequate moisture in the outer skin layers. Dry skin usually results from the excessive loss of moisture from the skin surface. This occurs due to two major factors: 1. Normally the skin's oil glands deposit a layer of oil on the skin's surface. This layer of oil prevents the loss of moisture from the skin. Exposure to soaps, cleaners, solvents, and disinfectants removes this oily film, allowing water to escape. 2. Water loss from the skin increases when the humidity is low. During winter months we spend a lot of time indoors where the air is heated. Heated air has very low humidity. This also contributes to dry skin.  A tendency for dry skin may accompany such disorders as eczema. Also, as people age, the number of functioning oil glands decreases, and the tendency toward dry skin can be a sensation of skin tightness when emerging from the shower.  How do I manage dry skin?  1. Humidify your environment. This can be accomplished by using a humidifier in your bedroom at night during winter months. 2. Bathing can actually put moisture back into your skin if done right. Take the following steps while bathing to sooth dry skin:  Avoid hot water, which only dries the skin and makes itching worse. Use warm water.  Avoid washcloths or extensive rubbing or scrubbing.  Use mild  soaps like unscented Dove, Oil of Olay, Cetaphil, Basis, or CeraVe.  If you take baths rather than showers, rinse off soap residue with clean water before getting out of tub.  Once out of the shower/tub, pat dry gently with a soft towel. Leave your skin damp.  While still damp, apply any medicated ointment/cream you were prescribed to the affected areas. After you apply your medicated ointment/cream, then apply your moisturizer to your whole body.This is the most important step in dry skin care. If this is omitted, your skin will continue to be dry.  The choice of moisturizer is also very important. In general, lotion will not provider enough moisture to severely dry skin because it is water based. You should use an ointment or cream. Moisturizers should also be unscented. Good choices include Vaseline (plain petrolatum), Aquaphor, Cetaphil, CeraVe, Vanicream, DML Forte, Aveeno moisture, or Eucerin Cream.  Bath oils can be helpful, but do not replace the application of moisturizer after the bath. In addition, they make the tub slippery causing an increased risk for falls. Therefore, we do not recommend their use.      If you have any questions or concerns for your doctor, please call our main line at [phone] and press option 4 to reach your doctor's medical assistant. If no one answers, please leave a voicemail as directed and we will return your call as soon as possible. Messages left after 4 pm will be answered  the following business day.   You may also send [place] a message via [place]. We typically respond to MyChart messages within 1-2 business days.  For prescription refills, please ask your pharmacy to contact our office. Our fax number is [phone].  If you have an urgent issue when the clinic is closed that cannot wait until the next business day, you can page your doctor at the number below.    Please note that while we do our best to be available for urgent issues outside of  office hours, we are not available 24/7.   If you have an urgent issue and are unable to reach [place], you may choose to seek medical care at your doctor's office, retail clinic, urgent care center, or emergency room.  If you have a medical emergency, please immediately call 911 or go to the emergency department.  Pager Numbers  - Dr. Nehemiah Massed: [phone]  - Dr. Laurence Ferrari: [phone]  - Dr. Nicole Kindred: [phone]  In the event of inclement weather, please call our main line at [phone] for an update on the status of any delays or closures.  Dermatology Medication Tips: Please keep the boxes that topical medications come in in order to help keep track of the instructions about where and how to use these. Pharmacies typically print the medication instructions only on the boxes and not directly on the medication tubes.   If your medication is too expensive, please contact our office at [phone] option 4 or send [place] a message through [place].   We are unable to tell what your co-pay for medications will be in advance as this is different depending on your insurance coverage. However, we may be able to find a substitute medication at lower cost or fill out paperwork to get insurance to cover a needed medication.   If a prior authorization is required to get your medication covered by your insurance company, please allow [place] 1-2 business days to complete this process.  Drug prices often vary depending on where the prescription is filled and some pharmacies may offer cheaper prices.  The website www.goodrx.com contains coupons for medications through different pharmacies. The prices here do not account for what the cost may be with help from insurance (it may be cheaper with your insurance), but the website can give you the price if you did not use any insurance.  - You can print the associated coupon and take it with your prescription to the pharmacy.  - You may also stop by our office during  regular business hours and pick up a GoodRx coupon card.  - If you need your prescription sent electronically to a different pharmacy, notify our office through [facility] or by phone at [phone] option 4.

## 2020-05-23 NOTE — Progress Notes (Signed)
   New Patient Visit  Subjective  Adrienne [name] is a 60 y.o. female who presents for the following: Rash (Back, bil post legs, ~37m, itchy TMC 0.1% cr qod, Nystatin cr qod, 2 steroid Ims, pt originally txted for Shingles, hx of eczema, hx of seasonal allergies and asthma, pt on Claritin and inhalers). H/o ezcema when a child    The following portions of the chart were reviewed this encounter and updated as appropriate:       Review of Systems:  No other skin or systemic complaints except as noted in HPI or Assessment and Plan.  Objective  Well appearing patient in no apparent distress; mood and affect are within normal limits.  A focused examination was performed including face, back, legs. Relevant physical exam findings are noted in the Assessment and Plan.  Objective  back, legs, face: Hyperpigmented scaly patches back, erythematous patch with hyperpigmentation bil popliteal fossa Lichenification and hyperpigmentation L periocular  BSA 8%   Assessment & Plan  Dermatitis back, legs, face  Atopic dermatitis (eczema) is a chronic, relapsing, pruritic condition that can significantly affect quality of life. It is often associated with allergic rhinitis and/or asthma and can require treatment with topical medications, phototherapy, or in severe cases a biologic medication called Dupixent in older children and adults.    Discussed Dupixent information given Pending approval start Dupixent Start Clobetasol sol/Cerave mix qd/bid to body, avoid f/g/a Start Tacrolimus 0.1% oint bid to aa rash on face, back and legs until clear, then prn flares  Dupilumab (Dupixent) is a treatment given by injection for adults with moderate-to-severe atopic dermatitis. Goal is control of skin condition, not cure. It is given as 2 injections at the first dose followed by 1 injection ever 2 weeks thereafter.  Potential side effects include allergic reaction, herpes infections, injection site reactions  and conjunctivitis (inflammation of the eyes).  The use of Dupixent requires long term medication management, including periodic office visits.   Topical steroids (such as triamcinolone, fluocinolone, fluocinonide, mometasone, clobetasol, halobetasol, betamethasone, hydrocortisone) can cause thinning and lightening of the skin if they are used for too long in the same area. Your physician has selected the right strength medicine for your problem and area affected on the body. Please use your medication only as directed by your physician to prevent side effects.     clobetasol (TEMOVATE) 0.05 % external solution - back, legs, face  tacrolimus (PROTOPIC) 0.1 % ointment - back, legs, face  Dupilumab (DUPIXENT) 300 MG/2ML SOPN - back, legs, face  Dupilumab (DUPIXENT) 300 MG/2ML SOPN - back, legs, face  Return in about 1 month (around 06/23/2020) for Atopic Derm.  I, Othelia Pulling, RMA, am acting as scribe for Brendolyn Patty, MD .  Documentation: I have reviewed the above documentation for accuracy and completeness, and I agree with the above.  Brendolyn Patty MD

## 2020-05-28 ENCOUNTER — Other Ambulatory Visit: Payer: Self-pay | Admitting: Nurse Practitioner

## 2020-05-28 MED ORDER — ATORVASTATIN CALCIUM 10 MG PO TABS: 10.0000 mg | ORAL_TABLET | Freq: Every day | ORAL | 3 refills | Status: DC

## 2020-06-05 ENCOUNTER — Other Ambulatory Visit: Payer: Self-pay

## 2020-06-05 DIAGNOSIS — L309 Dermatitis, unspecified: Secondary | ICD-10-CM

## 2020-06-05 MED ORDER — DUPIXENT 300 MG/2ML ~~LOC~~ SOAJ: 300.0000 mg | SUBCUTANEOUS | 6 refills | Status: DC

## 2020-06-05 MED ORDER — DUPIXENT 300 MG/2ML ~~LOC~~ SOAJ: 600.0000 mg | Freq: Once | SUBCUTANEOUS | 0 refills | Status: AC

## 2020-06-05 NOTE — Progress Notes (Signed)
Per Iantha Fallen, rx needs to be filled with [facility]

## 2020-06-18 ENCOUNTER — Other Ambulatory Visit: Payer: Self-pay

## 2020-06-18 ENCOUNTER — Ambulatory Visit
Admission: RE | Admit: 2020-06-18 | Discharge: 2020-06-18 | Disposition: A | Discharge status: Home / Self Care | Payer: Managed Care, Other (non HMO) | Source: Ambulatory Visit | Attending: Internal Medicine | Admitting: Internal Medicine

## 2020-06-18 DIAGNOSIS — Z853 Personal history of malignant neoplasm of breast: Secondary | ICD-10-CM | POA: Insufficient documentation

## 2020-06-18 DIAGNOSIS — Z13820 Encounter for screening for osteoporosis: Secondary | ICD-10-CM | POA: Diagnosis present

## 2020-06-20 ENCOUNTER — Other Ambulatory Visit: Payer: Self-pay | Admitting: Internal Medicine

## 2020-06-20 DIAGNOSIS — R928 Other abnormal and inconclusive findings on diagnostic imaging of breast: Secondary | ICD-10-CM

## 2020-06-20 DIAGNOSIS — N6310 Unspecified lump in the right breast, unspecified quadrant: Secondary | ICD-10-CM

## 2020-06-25 ENCOUNTER — Other Ambulatory Visit: Payer: Self-pay | Admitting: Internal Medicine

## 2020-06-25 DIAGNOSIS — N6310 Unspecified lump in the right breast, unspecified quadrant: Secondary | ICD-10-CM

## 2020-06-25 DIAGNOSIS — R928 Other abnormal and inconclusive findings on diagnostic imaging of breast: Secondary | ICD-10-CM

## 2020-06-26 ENCOUNTER — Telehealth: Payer: Self-pay | Admitting: Internal Medicine

## 2020-06-26 ENCOUNTER — Other Ambulatory Visit: Payer: Self-pay | Admitting: General Surgery

## 2020-06-26 ENCOUNTER — Telehealth: Payer: Self-pay | Admitting: *Deleted

## 2020-06-26 ENCOUNTER — Other Ambulatory Visit: Payer: Self-pay | Admitting: Nurse Practitioner

## 2020-06-26 DIAGNOSIS — R928 Other abnormal and inconclusive findings on diagnostic imaging of breast: Secondary | ICD-10-CM

## 2020-06-26 DIAGNOSIS — N6310 Unspecified lump in the right breast, unspecified quadrant: Secondary | ICD-10-CM

## 2020-06-26 NOTE — Telephone Encounter (Signed)
On 4/19-I spoke to patient regarding her results of the mammogram.  Patient evaluated Dr. Kris Mouton a cyst aspirated on the right breast.  Awaiting mammograms later this week.

## 2020-06-26 NOTE — Telephone Encounter (Signed)
Patient called asking if Dr B has seen the results of her Mammogram showing a mass in right breast and if she is being scheduled for a time to go back and to take another look at it.  CLINICAL DATA:  Screening.  EXAM: DIGITAL SCREENING BILATERAL MAMMOGRAM WITH TOMOSYNTHESIS AND CAD  TECHNIQUE: Bilateral screening digital craniocaudal and mediolateral oblique mammograms were obtained. Bilateral screening digital breast tomosynthesis was performed. The images were evaluated with computer-aided detection.  COMPARISON:  Previous exam(s).  ACR Breast Density Category b: There are scattered areas of fibroglandular density.  FINDINGS: In the right breast, a possible mass warrants further evaluation. In the left breast, no findings suspicious for malignancy.  IMPRESSION: Further evaluation is suggested for a possible mass in the right breast.  RECOMMENDATION: Diagnostic mammogram and possibly ultrasound of the right breast. (Code:[id number])  The patient will be contacted regarding the findings, and additional imaging will be scheduled.  BI-RADS CATEGORY  0: Incomplete. Need additional imaging evaluation and/or prior mammograms for comparison.   Electronically Signed   By: Audie Pinto M.D.   On: 06/19/2020 08:57

## 2020-06-27 ENCOUNTER — Ambulatory Visit: Payer: Managed Care, Other (non HMO) | Admitting: Dermatology

## 2020-06-27 ENCOUNTER — Other Ambulatory Visit: Payer: Self-pay

## 2020-06-27 DIAGNOSIS — L309 Dermatitis, unspecified: Secondary | ICD-10-CM | POA: Diagnosis not present

## 2020-06-27 NOTE — Patient Instructions (Addendum)
Dupilumab (Dupixent) is a treatment given by injection for adults with moderate-to-severe atopic dermatitis. Goal is control of skin condition, not cure. It is given as 2 injections at the first dose followed by 1 injection ever 2 weeks thereafter.  Potential side effects include allergic reaction, herpes infections, injection site reactions and conjunctivitis (inflammation of the eyes).  The use of Dupixent requires long term medication management, including periodic office visits.  Topical steroids (such as triamcinolone, fluocinolone, fluocinonide, mometasone, clobetasol, halobetasol, betamethasone, hydrocortisone) can cause thinning and lightening of the skin if they are used for too long in the same area. Your physician has selected the right strength medicine for your problem and area affected on the body. Please use your medication only as directed by your physician to prevent side effects.   If you have any questions or concerns for your doctor, please call our main line at [phone] and press option 4 to reach your doctor's medical assistant. If no one answers, please leave a voicemail as directed and we will return your call as soon as possible. Messages left after 4 pm will be answered the following business day.   You may also send [place] a message via [place]. We typically respond to MyChart messages within 1-2 business days.  For prescription refills, please ask your pharmacy to contact our office. Our fax number is [phone].  If you have an urgent issue when the clinic is closed that cannot wait until the next business day, you can page your doctor at the number below.    Please note that while we do our best to be available for urgent issues outside of office hours, we are not available 24/7.   If you have an urgent issue and are unable to reach [place], you may choose to seek medical care at your doctor's office, retail clinic, urgent care center, or emergency room.  If you have a  medical emergency, please immediately call 911 or go to the emergency department.  Pager Numbers  - Dr. Nehemiah Massed: [phone]  - Dr. Laurence Ferrari: [phone]  - Dr. Nicole Kindred: [phone]  In the event of inclement weather, please call our main line at [phone] for an update on the status of any delays or closures.  Dermatology Medication Tips: Please keep the boxes that topical medications come in in order to help keep track of the instructions about where and how to use these. Pharmacies typically print the medication instructions only on the boxes and not directly on the medication tubes.   If your medication is too expensive, please contact our office at [phone] option 4 or send [place] a message through [place].   We are unable to tell what your co-pay for medications will be in advance as this is different depending on your insurance coverage. However, we may be able to find a substitute medication at lower cost or fill out paperwork to get insurance to cover a needed medication.   If a prior authorization is required to get your medication covered by your insurance company, please allow [place] 1-2 business days to complete this process.  Drug prices often vary depending on where the prescription is filled and some pharmacies may offer cheaper prices.  The website www.goodrx.com contains coupons for medications through different pharmacies. The prices here do not account for what the cost may be with help from insurance (it may be cheaper with your insurance), but the website can give you the price if you did not use any insurance.  - You  can print the associated coupon and take it with your prescription to the pharmacy.  - You may also stop by our office during regular business hours and pick up a GoodRx coupon card.  - If you need your prescription sent electronically to a different pharmacy, notify our office through [facility] MyChart or by phone at [phone] option 4.  

## 2020-06-27 NOTE — Telephone Encounter (Signed)
See md phone note respond

## 2020-06-27 NOTE — Progress Notes (Signed)
   Follow-Up Visit   Subjective  Adrienne [name] is a 60 y.o. female who presents for the following: Follow-up (Patient here for 1 month follow-up dermatitis. She started her first Dupixent injection on April 10 (next injection will be 07/02/20). Patient tolerated well. She is also using tacrolimus ointment and clobetasol/CeraVe mix. She is mainly broken out on her back. Areas will clear, but then she will break out in a different spot. She is using Aveeno soap and Cetaphil Cream on her body after shower.).    The following portions of the chart were reviewed this encounter and updated as appropriate:       Review of Systems:  No other skin or systemic complaints except as noted in HPI or Assessment and Plan.  Objective  Well appearing patient in no apparent distress; mood and affect are within normal limits.  A focused examination was performed including face, back. Relevant physical exam findings are noted in the Assessment and Plan.  Objective  Back: Pink scaly papules on the spinal lower and spinal mid back.   Assessment & Plan  Dermatitis Back  Atopic dermatitis - Severe, on Dupixent (biologic medication).  Atopic dermatitis (eczema) is a chronic, relapsing, pruritic condition that can significantly affect quality of life. It is often associated with allergic rhinitis and/or asthma and can require treatment with topical medications, phototherapy, or in severe cases a biologic medication called Dupixent.    Continue Dupixent injections q 2 weeks as directed. (First started 06/17/20)  Continue Clobetasol/CeraVe mix qd/bid prn. Avoid face, groin, axilla.  Continue tacrolimus ointment qd/bid face/body prn.   Dupilumab (Dupixent) is a treatment given by injection for adults with moderate-to-severe atopic dermatitis. Goal is control of skin condition, not cure. It is given as 2 injections at the first dose followed by 1 injection ever 2 weeks thereafter.  Potential side effects  include allergic reaction, herpes infections, injection site reactions and conjunctivitis (inflammation of the eyes).  The use of Dupixent requires long term medication management, including periodic office visits.  Topical steroids (such as triamcinolone, fluocinolone, fluocinonide, mometasone, clobetasol, halobetasol, betamethasone, hydrocortisone) can cause thinning and lightening of the skin if they are used for too long in the same area. Your physician has selected the right strength medicine for your problem and area affected on the body. Please use your medication only as directed by your physician to prevent side effects.    Other Related Medications clobetasol (TEMOVATE) 0.05 % external solution tacrolimus (PROTOPIC) 0.1 % ointment Dupilumab (DUPIXENT) 300 MG/2ML SOPN  Return in about 3 months (around 09/26/2020) for dermatitis, dupixent f/up.   IJamesetta Orleans, CMA, am acting as scribe for Brendolyn Patty, MD .  Documentation: I have reviewed the above documentation for accuracy and completeness, and I agree with the above.  Brendolyn Patty MD

## 2020-06-29 ENCOUNTER — Other Ambulatory Visit: Payer: Self-pay

## 2020-06-29 ENCOUNTER — Ambulatory Visit
Admission: RE | Admit: 2020-06-29 | Discharge: 2020-06-29 | Disposition: A | Discharge status: Home / Self Care | Payer: Managed Care, Other (non HMO) | Source: Ambulatory Visit | Attending: Nurse Practitioner | Admitting: Nurse Practitioner

## 2020-06-29 DIAGNOSIS — N6310 Unspecified lump in the right breast, unspecified quadrant: Secondary | ICD-10-CM

## 2020-06-29 DIAGNOSIS — R928 Other abnormal and inconclusive findings on diagnostic imaging of breast: Secondary | ICD-10-CM

## 2020-09-27 ENCOUNTER — Other Ambulatory Visit: Payer: Self-pay | Admitting: *Deleted

## 2020-09-27 DIAGNOSIS — C50811 Malignant neoplasm of overlapping sites of right female breast: Secondary | ICD-10-CM

## 2020-09-27 DIAGNOSIS — D472 Monoclonal gammopathy: Secondary | ICD-10-CM

## 2020-09-27 DIAGNOSIS — Z171 Estrogen receptor negative status [ER-]: Secondary | ICD-10-CM

## 2020-09-29 ENCOUNTER — Encounter: Payer: Self-pay | Admitting: Nurse Practitioner

## 2020-09-29 DIAGNOSIS — K219 Gastro-esophageal reflux disease without esophagitis: Secondary | ICD-10-CM | POA: Insufficient documentation

## 2020-09-29 DIAGNOSIS — M85851 Other specified disorders of bone density and structure, right thigh: Secondary | ICD-10-CM | POA: Insufficient documentation

## 2020-10-01 ENCOUNTER — Encounter: Payer: Self-pay | Admitting: Internal Medicine

## 2020-10-01 ENCOUNTER — Inpatient Hospital Stay: Payer: Managed Care, Other (non HMO) | Attending: Internal Medicine

## 2020-10-01 ENCOUNTER — Other Ambulatory Visit: Payer: Self-pay

## 2020-10-01 ENCOUNTER — Other Ambulatory Visit (HOSPITAL_COMMUNITY)
Admission: RE | Admit: 2020-10-01 | Discharge: 2020-10-01 | Disposition: A | Discharge status: Home / Self Care | Payer: Managed Care, Other (non HMO) | Source: Ambulatory Visit | Attending: Nurse Practitioner | Admitting: Nurse Practitioner

## 2020-10-01 ENCOUNTER — Ambulatory Visit (INDEPENDENT_AMBULATORY_CARE_PROVIDER_SITE_OTHER): Payer: Managed Care, Other (non HMO) | Admitting: Nurse Practitioner

## 2020-10-01 ENCOUNTER — Encounter: Payer: Self-pay | Admitting: Nurse Practitioner

## 2020-10-01 ENCOUNTER — Inpatient Hospital Stay (HOSPITAL_BASED_OUTPATIENT_CLINIC_OR_DEPARTMENT_OTHER): Payer: Managed Care, Other (non HMO) | Admitting: Internal Medicine

## 2020-10-01 VITALS — BP 138/73 | HR 73 | Temp 98.2°F | Ht 65.0 in | Wt 218.4 lb

## 2020-10-01 VITALS — BP 132/72 | HR 63 | Temp 96.0°F | Resp 20 | Ht 65.0 in | Wt 217.0 lb

## 2020-10-01 DIAGNOSIS — G629 Polyneuropathy, unspecified: Secondary | ICD-10-CM | POA: Insufficient documentation

## 2020-10-01 DIAGNOSIS — K219 Gastro-esophageal reflux disease without esophagitis: Secondary | ICD-10-CM

## 2020-10-01 DIAGNOSIS — C50811 Malignant neoplasm of overlapping sites of right female breast: Secondary | ICD-10-CM

## 2020-10-01 DIAGNOSIS — Z124 Encounter for screening for malignant neoplasm of cervix: Secondary | ICD-10-CM | POA: Diagnosis present

## 2020-10-01 DIAGNOSIS — T451X5A Adverse effect of antineoplastic and immunosuppressive drugs, initial encounter: Secondary | ICD-10-CM

## 2020-10-01 DIAGNOSIS — G620 Drug-induced polyneuropathy: Secondary | ICD-10-CM | POA: Diagnosis not present

## 2020-10-01 DIAGNOSIS — Z853 Personal history of malignant neoplasm of breast: Secondary | ICD-10-CM

## 2020-10-01 DIAGNOSIS — Z171 Estrogen receptor negative status [ER-]: Secondary | ICD-10-CM | POA: Diagnosis not present

## 2020-10-01 DIAGNOSIS — D472 Monoclonal gammopathy: Secondary | ICD-10-CM | POA: Insufficient documentation

## 2020-10-01 DIAGNOSIS — E669 Obesity, unspecified: Secondary | ICD-10-CM | POA: Diagnosis not present

## 2020-10-01 DIAGNOSIS — I10 Essential (primary) hypertension: Secondary | ICD-10-CM

## 2020-10-01 DIAGNOSIS — Z0000 Encounter for general adult medical examination without abnormal findings: Secondary | ICD-10-CM | POA: Diagnosis not present

## 2020-10-01 DIAGNOSIS — E782 Mixed hyperlipidemia: Secondary | ICD-10-CM

## 2020-10-01 DIAGNOSIS — Z6835 Body mass index (BMI) 35.0-35.9, adult: Secondary | ICD-10-CM

## 2020-10-01 DIAGNOSIS — E559 Vitamin D deficiency, unspecified: Secondary | ICD-10-CM

## 2020-10-01 DIAGNOSIS — D649 Anemia, unspecified: Secondary | ICD-10-CM | POA: Insufficient documentation

## 2020-10-01 DIAGNOSIS — J4520 Mild intermittent asthma, uncomplicated: Secondary | ICD-10-CM

## 2020-10-01 DIAGNOSIS — E538 Deficiency of other specified B group vitamins: Secondary | ICD-10-CM

## 2020-10-01 DIAGNOSIS — M85851 Other specified disorders of bone density and structure, right thigh: Secondary | ICD-10-CM

## 2020-10-01 DIAGNOSIS — R7303 Prediabetes: Secondary | ICD-10-CM

## 2020-10-01 NOTE — Progress Notes (Signed)
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   # 2012 LEFT BREAST CA STAGE I s/p Lumpec [T1 (0.7CM);Triple Neg; G-3]; s/p mammosite RT; No adj chemo  #  Mild Anemia/M-protein 0.8gm/dl [sep 2016]  # BRCA- 1&2 NEGATIVE [july 2016]; s/p Genetic counseling [June 2020]  # PN s/p acupuncture  --------------------------------------------------     DIAGNOSIS: breast cancer  STAGE:   I      ;GOALS: cure  CURRENT/MOST RECENT THERAPY: surveillaince     History of breast cancer  08/31/2018 Genetic Testing   Two VUS identified on the CustomNext-Cancer+RNAinsight.  CFTR p.R450I and CTNNA1 p.L402S VUS.  The CustomNext-Expanded gene panel offered by [facility] and includes sequencing and rearrangement analysis for the following 81 genes: AIP, ALK, APC*, ATM*, AXIN2, BAP1, BARD1, BLM, BMPR1A, BRCA1*, BRCA2*, BRIP1*, CDC73, CDH1*, CDK4, CDKN1B, CDKN2A, CHEK2*, CTNNA1, DICER1, FANCC, FH, FLCN, GALNT12, HOXB13, KIT, MAX, MEN1, MET, MLH1*, MRE11A, MSH2*, MSH6*, MUTYH*, NBN, NF1*, NF2, NTHL1, PALB2*, PDGFRA, PHOX2B, PMS2*, POLD1, POLE, POT1, PRKAR1A, PTCH1, PTEN*, RAD50, RAD51C*, RAD51D*, RB1, RET, SDHA, SDHAF2, SDHB, SDHC, SDHD, SMAD4, SMARCA4, SMARCB1, SMARCE1, STK11, SUFU, TMEM127, TP53*, TSC1, TSC2, VHL and XRCC2 (sequencing and deletion/duplication); CASR, CFTR, CPA1, CTRC, EGFR, MITF, PRSS1 and SPINK1 (sequencing only); EPCAM and GREM1 (deletion/duplication only). DNA and RNA analyses performed for * genes. The report date is September 01, 2018.     INTERVAL HISTORY:  A pleasant 60 year old female patient with above history of stage I right-sided triple negative breast cancer; MGUS- Is here for follow-up/review results of the bone density test.  Patient denies any nausea vomiting abdominal pain.  No fevers or chills.  No worsening back pain.  Review of Systems  Constitutional:  Negative for chills, diaphoresis, fever, malaise/fatigue and weight loss.  HENT:  Negative for nosebleeds and sore throat.   Eyes:  Negative for double vision.  Respiratory:  Negative for cough, hemoptysis, sputum production, shortness of breath and wheezing.   Cardiovascular:  Negative for chest pain, palpitations, orthopnea and leg swelling.  Gastrointestinal:  Negative for abdominal pain, blood in stool, constipation, diarrhea, heartburn, melena, nausea and vomiting.  Genitourinary:  Negative for dysuria, frequency and urgency.  Musculoskeletal:  Positive for joint pain. Negative for back pain.  Skin: Negative.  Negative for itching and rash.  Neurological:  Positive for tingling. Negative for dizziness, focal weakness, weakness and headaches.  Endo/Heme/Allergies:  Does not bruise/bleed easily.  Psychiatric/Behavioral:  Negative for depression. The patient is not nervous/anxious and does not have insomnia.     PAST MEDICAL HISTORY :  Past Medical History:  Diagnosis Date   Anemia    Asthma    BRCA negative 09/22/2014   Breast cancer ([place]) 2012   LT LUMPECTOMY   Breast cancer ([place]) 2016   RT LUMPECTOMY   Breast screening, unspecified    Constipation    Dry eyes 2012   Eczema    Family history of breast cancer    Family history of ovarian cancer    Family history of prostate cancer    GERD (gastroesophageal reflux  disease)    Hematuria    Kidney stone    Lump or mass in breast    Malignant neoplasm of breast (female), unspecified site 07/08/2014   Right breast, 5 mm, T1a,N0, triple negative; high risk for recurrence on  Mammoprint.   Malignant neoplasm of upper-outer quadrant of female breast ([place]) 05/08/2010   Left breast: T1b, N0, M); triple negative, DCIS present. No chemotherapy. MammoSite radiation.   Neuropathy    pt states in fingertips and feet   Obesity, unspecified    Pancreatic lesion    Personal history of chemotherapy 2016   BREAST CA- Right   Personal history of malignant neoplasm of breast 2012   has been treated with left breast wide excision and radiation therapy; Patient has DCIS as well as a 7 mm infiltrating mammary carcinoma triple negative removed on 05-08-10   Personal history of radiation therapy 2012   BREAST CA - MAMMOSITE- Left   Personal history of radiation therapy 2016   BREAST CA - MAMMOSITE- Right   Screening for obesity    Seasonal allergies    Special screening for malignant neoplasms, colon    Unspecified essential hypertension     PAST SURGICAL HISTORY :   Past Surgical History:  Procedure Laterality Date   BREAST BIOPSY Right 2016   Invasive   BREAST CYST ASPIRATION Left 2010   BREAST LUMPECTOMY Left 2012   BREAST CA   BREAST LUMPECTOMY Right 2016   INVASIVE MAMMARY CARCINOMA   BREAST LUMPECTOMY WITH AXILLARY LYMPH NODE BIOPSY Right 08/01/2014   Procedure: BREAST LUMPECTOMY WITH AXILLARY LYMPH NODE BIOPSY;  Surgeon: Robert Bellow, MD;  Location: [facility] ORS;  Service: General;  Laterality: Right;   BREAST MAMMOSITE  April 2012   mammosite placement and removal    BREAST SURGERY Left 2012   left breast wide excision, sentinel node, MammoSite   BREAST SURGERY Right 08/06/2014   Wide excision/sentinel node biopsy, T1a, N0 triple negative, MammoSite   CESAREAN SECTION  1988   COLONOSCOPY  October 17, 2009   Dr. Dionne Milo, normal study.   KIDNEY STONE SURGERY  2003   stones removed by Dr. Quillian Quince- stent placed/removed   PORTACATH PLACEMENT Left 11/09/2014   Procedure: INSERTION PORT-A-CATH;  Surgeon: Robert Bellow, MD;  Location: [facility] ORS;  Service: General;   Laterality: Left;   SENTINEL NODE BIOPSY Right 08/01/2014   Procedure: SENTINEL NODE BIOPSY;  Surgeon: Robert Bellow, MD;  Location: [facility] ORS;  Service: General;  Laterality: Right;   TUBAL LIGATION      FAMILY HISTORY :   Family History  Problem Relation Age of Onset   Ovarian cancer Mother    Diabetes Mother    Diabetes Father    Prostate cancer Father        dx in his 80s   Hypertension Brother    Cancer Maternal Grandmother        bone   Cancer Maternal Grandfather        lung   Cancer Paternal Grandmother        lung   Asthma Paternal Grandfather    Hypertension Sister    Prostate cancer Brother 86   AAA (abdominal aortic aneurysm) Maternal Uncle    Breast cancer Other     SOCIAL HISTORY:   Social History   Tobacco Use   Smoking status: Never   Smokeless tobacco: Never  Vaping Use   Vaping Use: Never used  Substance Use Topics   Alcohol use:  Yes    Comment: occasionally   Drug use: No    ALLERGIES:  is allergic to cayenne pepper [cayenne], shellfish allergy, and tape.  MEDICATIONS:  Current Outpatient Medications  Medication Sig Dispense Refill   albuterol (VENTOLIN HFA) 108 (90 Base) MCG/ACT inhaler USE 2 INHALATIONS BY MOUTH  EVERY 6 HOURS AS NEEDED FOR WHEEZING OR SHORTNESS OF  BREATH 26.8 g 3   aspirin 81 MG tablet Take 81 mg by mouth daily.     atorvastatin (LIPITOR) 10 MG tablet Take 1 tablet (10 mg total) by mouth daily. 90 tablet 3   bisacodyl (DULCOLAX) 5 MG EC tablet Take 5 mg by mouth daily as needed for moderate constipation.     Cholecalciferol (VITAMIN D3) 1000 UNITS CAPS Take 1 capsule by mouth daily.      clobetasol (TEMOVATE) 0.05 % external solution Apply 1 application topically daily. Mix bottle in 1 tub of cerave cream and use qd to trunk, arms and legs, avoid face, groin, axilla 50 mL 1   Dupilumab (DUPIXENT) 300 MG/2ML SOPN Inject 300 mg into the skin every 14 (fourteen) days. Starting at day 15 for maintenance. 4 mL 6    esomeprazole (NEXIUM) 40 MG capsule Take 40 mg by mouth 2 (two) times daily as needed (acid reflux).      lisinopril (ZESTRIL) 10 MG tablet Take 1 tablet (10 mg total) by mouth daily. 90 tablet 4   loratadine (CLARITIN) 10 MG tablet Take 10 mg by mouth daily. Reported on 04/13/2015     polyethylene glycol (MIRALAX / GLYCOLAX) packet Take 17 g by mouth every other day.     Turmeric 500 MG CAPS Take 500 mg by mouth daily.     No current facility-administered medications for this visit.   Facility-Administered Medications Ordered in Other Visits  Medication Dose Route Frequency Provider Last Rate Last Admin   sodium chloride 0.9 % injection 10 mL  10 mL Intravenous PRN Leia Alf, MD   10 mL at 11/10/14 1138    PHYSICAL EXAMINATION: ECOG PERFORMANCE STATUS: 1 - Symptomatic but completely ambulatory  BP 132/72   Pulse 63   Temp (!) 96 F (35.6 C) (Tympanic)   Resp 20   Ht 5' 5"  (1.651 m)   Wt 217 lb (98.4 kg)   LMP  (LMP Unknown)   SpO2 100%   BMI 36.11 kg/m   Filed Weights   10/01/20 1043  Weight: 217 lb (98.4 kg)    Physical Exam HENT:     Head: Normocephalic and atraumatic.     Mouth/Throat:     Pharynx: No oropharyngeal exudate.  Eyes:     Pupils: Pupils are equal, round, and reactive to light.  Cardiovascular:     Rate and Rhythm: Normal rate and regular rhythm.  Pulmonary:     Effort: No respiratory distress.     Breath sounds: No wheezing.  Abdominal:     General: Bowel sounds are normal. There is no distension.     Palpations: Abdomen is soft. There is no mass.     Tenderness: no abdominal tenderness There is no guarding or rebound.  Musculoskeletal:        General: No tenderness. Normal range of motion.     Cervical back: Normal range of motion and neck supple.  Skin:    General: Skin is warm.  Neurological:     Mental Status: She is alert and oriented to person, place, and time.  Psychiatric:  Mood and Affect: Affect normal.     LABORATORY  DATA:  I have reviewed the data as listed    Component Value Date/Time   NA 139 10/01/2020 0902   NA 140 07/16/2011 2130   K 4.2 10/01/2020 0902   K 3.7 07/16/2011 2130   CL 104 10/01/2020 0902   CL 106 07/16/2011 2130   CO2 22 10/01/2020 0902   CO2 27 07/16/2011 2130   GLUCOSE 88 10/01/2020 0902   GLUCOSE 106 (H) 04/02/2020 0935   GLUCOSE 93 07/16/2011 2130   BUN 27 10/01/2020 0902   BUN 16 07/16/2011 2130   CREATININE 0.94 10/01/2020 0902   CREATININE 0.95 07/16/2011 2130   CALCIUM 9.4 10/01/2020 0902   CALCIUM 9.0 07/16/2011 2130   PROT 7.1 10/01/2020 0902   ALBUMIN 4.2 10/01/2020 0902   AST 11 10/01/2020 0902   ALT 10 10/01/2020 0902   ALKPHOS 95 10/01/2020 0902   BILITOT 0.3 10/01/2020 0902   GFRNONAA 70 04/16/2020 1159   GFRNONAA >60 04/02/2020 0935   GFRNONAA >60 07/16/2011 2130   GFRAA 80 04/16/2020 1159   GFRAA >60 07/16/2011 2130    No results found for: SPEP, UPEP  Lab Results  Component Value Date   WBC 6.5 10/01/2020   NEUTROABS 3.7 10/01/2020   HGB 10.5 (L) 10/01/2020   HCT 32.8 (L) 10/01/2020   MCV 99 (H) 10/01/2020   PLT 203 10/01/2020      Chemistry      Component Value Date/Time   NA 139 10/01/2020 0902   NA 140 07/16/2011 2130   K 4.2 10/01/2020 0902   K 3.7 07/16/2011 2130   CL 104 10/01/2020 0902   CL 106 07/16/2011 2130   CO2 22 10/01/2020 0902   CO2 27 07/16/2011 2130   BUN 27 10/01/2020 0902   BUN 16 07/16/2011 2130   CREATININE 0.94 10/01/2020 0902   CREATININE 0.95 07/16/2011 2130      Component Value Date/Time   CALCIUM 9.4 10/01/2020 0902   CALCIUM 9.0 07/16/2011 2130   ALKPHOS 95 10/01/2020 0902   AST 11 10/01/2020 0902   ALT 10 10/01/2020 0902   BILITOT 0.3 10/01/2020 0902         ASSESSMENT & PLAN:   MGUS (monoclonal gammopathy of unknown significance) # MGUS-Jun 2021-1 gm/dl; reviewed natural history of MGUS; STABLE; 2022-M protein labs pending.  Otherwise hemoglobin stable at 10-11. STABLE.  No evidence of  renal function or hypercalcemia.  # 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  Clinically no evidence of recurrence; April 2022- normal-stable complicated cyst.  Awaiting repeat mammogram in October 2022/Dr. Byrnett.  Continue surveillance- STABLE  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  Hb 10-11-STABLE  #Osteopenia : Reviewed the bone density  [April 2022-]T-score of -1.3; continue VitD. [hx of falls]; recommend resistance training [pt currently on @ home/videos]  # Obesity-  Discussed importance of healthy weight/and weight loss.  Strongly recommend eating more green leafy vegetables and cutting down processed food/ carbohydrates.  Instead increasing whole grains / protein in the diet.  Multiple studies have shown that optimal weight would help improve cardiovascular risk; also shown to cut on the risk of malignancies-colon cancer, breast cancer ovarian/uterine cancer in women and also prostate cancer in men.  Improvement of muscle mass has helped improved-osteoporosis.   # DISPOSITION:  # diagnostic mammo/US in October 2022.  # follow up in 6 months-MD /labs- cbc/cmp/ca-27-29/MM panel;K-L panel-Dr.B  Cammie Sickle, MD 10/02/2020 8:24 AM

## 2020-10-01 NOTE — Assessment & Plan Note (Signed)
Continue to collaborate with oncology and review notes. 

## 2020-10-01 NOTE — Patient Instructions (Signed)
https://www.nhlbi.nih.gov/files/docs/public/heart/dash_brief.pdf">  DASH Eating Plan DASH stands for Dietary Approaches to Stop Hypertension. The DASH eating plan is a healthy eating plan that has been shown to: Reduce high blood pressure (hypertension). Reduce your risk for type 2 diabetes, heart disease, and stroke. Help with weight loss. What are tips for following this plan? Reading food labels Check food labels for the amount of salt (sodium) per serving. Choose foods with less than 5 percent of the Daily Value of sodium. Generally, foods with less than 300 milligrams (mg) of sodium per serving fit into this eating plan. To find whole grains, look for the word "whole" as the first word in the ingredient list. Shopping Buy products labeled as "low-sodium" or "no salt added." Buy fresh foods. Avoid canned foods and pre-made or frozen meals. Cooking Avoid adding salt when cooking. Use salt-free seasonings or herbs instead of table salt or sea salt. Check with your health care provider or pharmacist before using salt substitutes. Do not fry foods. Cook foods using healthy methods such as baking, boiling, grilling, roasting, and broiling instead. Cook with heart-healthy oils, such as olive, canola, avocado, soybean, or sunflower oil. Meal planning  Eat a balanced diet that includes: 4 or more servings of fruits and 4 or more servings of vegetables each day. Try to fill one-half of your plate with fruits and vegetables. 6-8 servings of whole grains each day. Less than 6 oz (170 g) of lean meat, poultry, or fish each day. A 3-oz (85-g) serving of meat is about the same size as a deck of cards. One egg equals 1 oz (28 g). 2-3 servings of low-fat dairy each day. One serving is 1 cup (237 mL). 1 serving of nuts, seeds, or beans 5 times each week. 2-3 servings of heart-healthy fats. Healthy fats called omega-3 fatty acids are found in foods such as walnuts, flaxseeds, fortified milks, and eggs.  These fats are also found in cold-water fish, such as sardines, salmon, and mackerel. Limit how much you eat of: Canned or prepackaged foods. Food that is high in trans fat, such as some fried foods. Food that is high in saturated fat, such as fatty meat. Desserts and other sweets, sugary drinks, and other foods with added sugar. Full-fat dairy products. Do not salt foods before eating. Do not eat more than 4 egg yolks a week. Try to eat at least 2 vegetarian meals a week. Eat more home-cooked food and less restaurant, buffet, and fast food.  Lifestyle When eating at a restaurant, ask that your food be prepared with less salt or no salt, if possible. If you drink alcohol: Limit how much you use to: 0-1 drink a day for women who are not pregnant. 0-2 drinks a day for men. Be aware of how much alcohol is in your drink. In the [place] one drink equals one 12 oz bottle of beer (355 mL), one 5 oz glass of wine (148 mL), or one 1 oz glass of hard liquor (44 mL). General information Avoid eating more than 2,300 mg of salt a day. If you have hypertension, you may need to reduce your sodium intake to 1,500 mg a day. Work with your health care provider to maintain a healthy body weight or to lose weight. Ask what an ideal weight is for you. Get at least 30 minutes of exercise that causes your heart to beat faster (aerobic exercise) most days of the week. Activities may include walking, swimming, or biking. Work with your health care provider   or dietitian to adjust your eating plan to your individual calorie needs. What foods should I eat? Fruits All fresh, dried, or frozen fruit. Canned fruit in natural juice (without addedsugar). Vegetables Fresh or frozen vegetables (raw, steamed, roasted, or grilled). Low-sodium or reduced-sodium tomato and vegetable juice. Low-sodium or reduced-sodium tomatosauce and tomato paste. Low-sodium or reduced-sodium canned vegetables. Grains Whole-grain or  whole-wheat bread. Whole-grain or whole-wheat pasta. Brown rice. Oatmeal. Quinoa. Bulgur. Whole-grain and low-sodium cereals. Pita bread.Low-fat, low-sodium crackers. Whole-wheat flour tortillas. Meats and other proteins Skinless chicken or turkey. Ground chicken or turkey. Pork with fat trimmed off. Fish and seafood. Egg whites. Dried beans, peas, or lentils. Unsalted nuts, nut butters, and seeds. Unsalted canned beans. Lean cuts of beef with fat trimmed off. Low-sodium, lean precooked or cured meat, such as sausages or meatloaves. Dairy Low-fat (1%) or fat-free (skim) milk. Reduced-fat, low-fat, or fat-free cheeses. Nonfat, low-sodium ricotta or cottage cheese. Low-fat or nonfatyogurt. Low-fat, low-sodium cheese. Fats and oils Soft margarine without trans fats. Vegetable oil. Reduced-fat, low-fat, or light mayonnaise and salad dressings (reduced-sodium). Canola, safflower, olive, avocado, soybean, andsunflower oils. Avocado. Seasonings and condiments Herbs. Spices. Seasoning mixes without salt. Other foods Unsalted popcorn and pretzels. Fat-free sweets. The items listed above may not be a complete list of foods and beverages you can eat. Contact a dietitian for more information. What foods should I avoid? Fruits Canned fruit in a light or heavy syrup. Fried fruit. Fruit in cream or buttersauce. Vegetables Creamed or fried vegetables. Vegetables in a cheese sauce. Regular canned vegetables (not low-sodium or reduced-sodium). Regular canned tomato sauce and paste (not low-sodium or reduced-sodium). Regular tomato and vegetable juice(not low-sodium or reduced-sodium). Pickles. Olives. Grains Baked goods made with fat, such as croissants, muffins, or some breads. Drypasta or rice meal packs. Meats and other proteins Fatty cuts of meat. Ribs. Fried meat. Bacon. Bologna, salami, and other precooked or cured meats, such as sausages or meat loaves. Fat from the back of a pig (fatback). Bratwurst.  Salted nuts and seeds. Canned beans with added salt. Canned orsmoked fish. Whole eggs or egg yolks. Chicken or turkey with skin. Dairy Whole or 2% milk, cream, and half-and-half. Whole or full-fat cream cheese. Whole-fat or sweetened yogurt. Full-fat cheese. Nondairy creamers. Whippedtoppings. Processed cheese and cheese spreads. Fats and oils Butter. Stick margarine. Lard. Shortening. Ghee. Bacon fat. Tropical oils, suchas coconut, palm kernel, or palm oil. Seasonings and condiments Onion salt, garlic salt, seasoned salt, table salt, and sea salt. Worcestershire sauce. Tartar sauce. Barbecue sauce. Teriyaki sauce. Soy sauce, including reduced-sodium. Steak sauce. Canned and packaged gravies. Fish sauce. Oyster sauce. Cocktail sauce. Store-bought horseradish. Ketchup. Mustard. Meat flavorings and tenderizers. Bouillon cubes. Hot sauces. Pre-made or packaged marinades. Pre-made or packaged taco seasonings. Relishes. Regular saladdressings. Other foods Salted popcorn and pretzels. The items listed above may not be a complete list of foods and beverages you should avoid. Contact a dietitian for more information. Where to find more information National Heart, Lung, and Blood Institute: www.nhlbi.nih.gov American Heart Association: www.heart.org Academy of Nutrition and Dietetics: www.eatright.org National Kidney Foundation: www.kidney.org Summary The DASH eating plan is a healthy eating plan that has been shown to reduce high blood pressure (hypertension). It may also reduce your risk for type 2 diabetes, heart disease, and stroke. When on the DASH eating plan, aim to eat more fresh fruits and vegetables, whole grains, lean proteins, low-fat dairy, and heart-healthy fats. With the DASH eating plan, you should limit salt (sodium) intake to 2,300   mg a day. If you have hypertension, you may need to reduce your sodium intake to 1,500 mg a day. Work with your health care provider or dietitian to adjust  your eating plan to your individual calorie needs. This information is not intended to replace advice given to you by your health care provider. Make sure you discuss any questions you have with your healthcare provider. Document Revised: 01/28/2019 Document Reviewed: 01/28/2019 Elsevier Patient Education  2022 Elsevier Inc.  

## 2020-10-01 NOTE — Assessment & Plan Note (Signed)
BMI 36.34.  Recommended eating smaller high protein, low fat meals more frequently and exercising 30 mins a day 5 times a week with a goal of 10-15lb weight loss in the next 3 months. Patient voiced their understanding and motivation to adhere to these recommendations.

## 2020-10-01 NOTE — Progress Notes (Signed)
BP 138/73   Pulse 73   Temp 98.2 F (36.8 C) (Oral)   Ht _0  (1.651 m)   Wt 218 lb 6.4 oz (99.1 kg)   LMP  (LMP Unknown)   SpO2 98%   BMI 36.34 kg/m    Subjective:    Patient ID: Adrienne [name], female    DOB: 1960-05-07, 60 y.o.   MRN: [id number]  HPI: Adrienne [name] is a 60 y.o. female presenting on 10/01/2020 for comprehensive medical examination. Current medical complaints include:none  She currently lives with: husband Menopausal Symptoms: no   Recent breast exam performed by Dr. Bary Castilla on April 15th.   HYPERTENSION Currently taking Lisinopril and ASA.  Her last A1C in 2020 was 5.8%.  She has been making diet changes at home.  Hypertension status: controlled  Satisfied with current treatment? yes Duration of hypertension: chronic BP monitoring frequency:  daily BP range: 120/60 range at home BP medication side effects:  no Medication compliance: good compliance Aspirin: yes Recurrent headaches: no Visual changes: no Palpitations: no Dyspnea: no Chest pain: no Lower extremity edema: trace on both sides, baseline -- compression hose at home Dizzy/lightheaded: no  The 10-year ASCVD risk score Mikey Bussing DC Jr., et al., 2013) is: 8.7%   Values used to calculate the score:     Age: 68 years     Sex: Female     Is Non-Hispanic African American: Yes     Diabetic: No     Tobacco smoker: No     Systolic Blood Pressure: 300 mmHg     Is BP treated: Yes     HDL Cholesterol: 66 mg/dL     Total Cholesterol: 200 mg/dL   ASTHMA Current medications include Albuterol and Claritin. Asthma status: stable Satisfied with current treatment?: yes Albuterol/rescue inhaler frequency: maybe every few months Dyspnea frequency: none Wheezing frequency: none Cough frequency: none Nocturnal symptom frequency: none Limitation of activity: no Current upper respiratory symptoms: no Triggers: seasonal Home peak flows: none Last Spirometry: unknown Failed/intolerant to following  asthma meds: none Asthma meds in past: none Aerochamber/spacer use: no Visits to ER or Urgent Care in past year: no Pneumovax: Up to Date Influenza: Up to Date  GERD Continues on Nexium as needed. GERD control status: controlled Satisfied with current treatment? yes Heartburn frequency: 1-2 times a week Medication side effects: no  Medication compliance: stable Previous GERD medications: Pepcid Antacid use frequency:  none Dysphagia: no Odynophagia:  no Hematemesis: no Blood in stool: no EGD: no   OSTEOPENIA Noted on DEXA 06/18/20 = the BMD measured at Femur Neck Right is 0.854 g/cm2 with a T-score of -1.3. Adequate calcium & vitamin D: yes Weight bearing exercises: yes   MGUS: Saw Dr. Tish Men last 04/02/20 and sees them today for follow-up.  Continues to follow with Dr. Bary Castilla annually due to breast cancer history, has been survivor -- left 2012 and right 2016.  They are monitoring her anemia, she reports history of kidney stones -- did see urology but did not need surgery.  Denies any symptoms or pain today.   Depression Screen done today and results listed below:  Depression screen [facility] 2/9 10/01/2020 04/16/2020 09/26/2019 09/20/2018 09/14/2017  Decreased Interest 0 0 0 0 3  Down, Depressed, Hopeless 0 0 0 0 2  PHQ - 2 Score 0 0 0 0 5  Altered sleeping - - - 0 1  Tired, decreased energy - - - 0 2  Change in appetite - - -  0 0  Feeling bad or failure about yourself  - - - 0 2  Trouble concentrating - - - 0 0  Moving slowly or fidgety/restless - - - 0 1  Suicidal thoughts - - - 0 1  PHQ-9 Score - - - 0 12  Difficult doing work/chores - - - Not difficult at all -  Some recent data might be hidden    The patient does not have a history of falls. I did not complete a risk assessment for falls. A plan of care for falls was not documented.   Past Medical History:  Past Medical History:  Diagnosis Date   Anemia    Asthma    BRCA negative 09/22/2014   Breast cancer ([place])  2012   LT LUMPECTOMY   Breast cancer ([place]) 2016   RT LUMPECTOMY   Breast screening, unspecified    Constipation    Dry eyes 2012   Eczema    Family history of breast cancer    Family history of ovarian cancer    Family history of prostate cancer    GERD (gastroesophageal reflux disease)    Hematuria    Kidney stone    Lump or mass in breast    Malignant neoplasm of breast (female), unspecified site 07/08/2014   Right breast, 5 mm, T1a,N0, triple negative; high risk for recurrence on Mammoprint.   Malignant neoplasm of upper-outer quadrant of female breast ([place]) 05/08/2010   Left breast: T1b, N0, M); triple negative, DCIS present. No chemotherapy. MammoSite radiation.   Neuropathy    pt states in fingertips and feet   Obesity, unspecified    Pancreatic lesion    Personal history of chemotherapy 2016   BREAST CA- Right   Personal history of malignant neoplasm of breast 2012   has been treated with left breast wide excision and radiation therapy; Patient has DCIS as well as a 7 mm infiltrating mammary carcinoma triple negative removed on 05-08-10   Personal history of radiation therapy 2012   BREAST CA - MAMMOSITE- Left   Personal history of radiation therapy 2016   BREAST CA - MAMMOSITE- Right   Screening for obesity    Seasonal allergies    Special screening for malignant neoplasms, colon    Unspecified essential hypertension     Surgical History:  Past Surgical History:  Procedure Laterality Date   BREAST BIOPSY Right 2016   Invasive   BREAST CYST ASPIRATION Left 2010   BREAST LUMPECTOMY Left 2012   BREAST CA   BREAST LUMPECTOMY Right 2016   INVASIVE MAMMARY CARCINOMA   BREAST LUMPECTOMY WITH AXILLARY LYMPH NODE BIOPSY Right 08/01/2014   Procedure: BREAST LUMPECTOMY WITH AXILLARY LYMPH NODE BIOPSY;  Surgeon: Robert Bellow, MD;  Location: [facility] ORS;  Service: General;  Laterality: Right;   BREAST MAMMOSITE  April 2012   mammosite placement and removal    BREAST  SURGERY Left 2012   left breast wide excision, sentinel node, MammoSite   BREAST SURGERY Right 08/06/2014   Wide excision/sentinel node biopsy, T1a, N0 triple negative, MammoSite   CESAREAN SECTION  1988   COLONOSCOPY  October 17, 2009   Dr. Dionne Milo, normal study.   KIDNEY STONE SURGERY  2003   stones removed by Dr. Quillian Quince- stent placed/removed   PORTACATH PLACEMENT Left 11/09/2014   Procedure: INSERTION PORT-A-CATH;  Surgeon: Robert Bellow, MD;  Location: [facility] ORS;  Service: General;  Laterality: Left;   SENTINEL NODE BIOPSY Right 08/01/2014   Procedure: Efraim Kaufmann  NODE BIOPSY;  Surgeon: Robert Bellow, MD;  Location: [facility] ORS;  Service: General;  Laterality: Right;   TUBAL LIGATION      Medications:  Current Outpatient Medications on File Prior to Visit  Medication Sig   albuterol (VENTOLIN HFA) 108 (90 Base) MCG/ACT inhaler USE 2 INHALATIONS BY MOUTH  EVERY 6 HOURS AS NEEDED FOR WHEEZING OR SHORTNESS OF  BREATH   aspirin 81 MG tablet Take 81 mg by mouth daily.   atorvastatin (LIPITOR) 10 MG tablet Take 1 tablet (10 mg total) by mouth daily.   bisacodyl (DULCOLAX) 5 MG EC tablet Take 5 mg by mouth daily as needed for moderate constipation.   Cholecalciferol (VITAMIN D3) 1000 UNITS CAPS Take 1 capsule by mouth daily.    clobetasol (TEMOVATE) 0.05 % external solution Apply 1 application topically daily. Mix bottle in 1 tub of cerave cream and use qd to trunk, arms and legs, avoid face, groin, axilla   Dupilumab (DUPIXENT) 300 MG/2ML SOPN Inject 300 mg into the skin every 14 (fourteen) days. Starting at day 15 for maintenance.   esomeprazole (NEXIUM) 40 MG capsule Take 40 mg by mouth 2 (two) times daily as needed (acid reflux).    lisinopril (ZESTRIL) 10 MG tablet Take 1 tablet (10 mg total) by mouth daily.   loratadine (CLARITIN) 10 MG tablet Take 10 mg by mouth daily. Reported on 04/13/2015   polyethylene glycol (MIRALAX / GLYCOLAX) packet Take 17 g by mouth every other day.   Turmeric  500 MG CAPS Take 500 mg by mouth daily.   Current Facility-Administered Medications on File Prior to Visit  Medication   sodium chloride 0.9 % injection 10 mL    Allergies:  Allergies  Allergen Reactions   Cayenne Pepper [Cayenne] Shortness Of Breath   Shellfish Allergy Other (See Comments)    Hives and swelling on ears and feet   Tape Rash    Surgical tape from breast biopsy    Social History:  Social History   Socioeconomic History   Marital status: Single    Spouse name: Not on file   Number of children: Not on file   Years of education: Not on file   Highest education level: Not on file  Occupational History   Not on file  Tobacco Use   Smoking status: Never   Smokeless tobacco: Never  Vaping Use   Vaping Use: Never used  Substance and Sexual Activity   Alcohol use: Yes    Comment: occasionally   Drug use: No   Sexual activity: Yes  Other Topics Concern   Not on file  Social History Narrative   Not on file   Social Determinants of Health   Financial Resource Strain: Not on file  Food Insecurity: Not on file  Transportation Needs: Not on file  Physical Activity: Not on file  Stress: Not on file  Social Connections: Not on file  Intimate Partner Violence: Not on file   Social History   Tobacco Use  Smoking Status Never  Smokeless Tobacco Never   Social History   Substance and Sexual Activity  Alcohol Use Yes   Comment: occasionally    Family History:  Family History  Problem Relation Age of Onset   Ovarian cancer Mother    Diabetes Mother    Diabetes Father    Prostate cancer Father        dx in his 23s   Hypertension Brother    Cancer Maternal Grandmother  bone   Cancer Maternal Grandfather        lung   Cancer Paternal Grandmother        lung   Asthma Paternal Grandfather    Hypertension Sister    Prostate cancer Brother 68   AAA (abdominal aortic aneurysm) Maternal Uncle    Breast cancer Other     Past medical history,  surgical history, medications, allergies, family history and social history reviewed with patient today and changes made to appropriate areas of the chart.   Review of Systems - negative All other ROS negative except what is listed above and in the HPI.      Objective:    BP 138/73   Pulse 73   Temp 98.2 F (36.8 C) (Oral)   Ht _0  (1.651 m)   Wt 218 lb 6.4 oz (99.1 kg)   LMP  (LMP Unknown)   SpO2 98%   BMI 36.34 kg/m   Wt Readings from Last 3 Encounters:  10/01/20 218 lb 6.4 oz (99.1 kg)  05/07/20 213 lb 3.2 oz (96.7 kg)  04/16/20 217 lb 12.8 oz (98.8 kg)    Physical Exam Vitals and nursing note reviewed. Exam conducted with a chaperone present.  Constitutional:      General: She is awake. She is not in acute distress.    Appearance: She is well-developed. She is not ill-appearing.  HENT:     Head: Normocephalic and atraumatic.     Right Ear: Hearing, tympanic membrane, ear canal and external ear normal. No drainage.     Left Ear: Hearing, tympanic membrane, ear canal and external ear normal. No drainage.     Nose: Nose normal.     Right Sinus: No maxillary sinus tenderness or frontal sinus tenderness.     Left Sinus: No maxillary sinus tenderness or frontal sinus tenderness.     Mouth/Throat:     Mouth: Mucous membranes are moist.     Pharynx: Oropharynx is clear. Uvula midline. No pharyngeal swelling, oropharyngeal exudate or posterior oropharyngeal erythema.  Eyes:     General: Lids are normal.        Right eye: No discharge.        Left eye: No discharge.     Extraocular Movements: Extraocular movements intact.     Conjunctiva/sclera: Conjunctivae normal.     Pupils: Pupils are equal, round, and reactive to light.     Visual Fields: Right eye visual fields normal and left eye visual fields normal.  Neck:     Thyroid: No thyromegaly.     Vascular: No carotid bruit.     Trachea: Trachea normal.  Cardiovascular:     Rate and Rhythm: Normal rate and regular  rhythm.     Heart sounds: Normal heart sounds. No murmur heard.   No gallop.  Pulmonary:     Effort: Pulmonary effort is normal. No accessory muscle usage or respiratory distress.     Breath sounds: Normal breath sounds.  Chest:     Comments: Deferred due to recent exams. Abdominal:     General: Bowel sounds are normal.     Palpations: Abdomen is soft. There is no hepatomegaly or splenomegaly.     Tenderness: There is no abdominal tenderness.     Hernia: There is no hernia in the left inguinal area or right inguinal area.  Genitourinary:    Exam position: Lithotomy position.     Pubic Area: No rash.      Labia:  Right: No rash.        Left: No rash.      Urethra: No prolapse.     Vagina: Normal.     Cervix: Normal.     Uterus: Normal.      Adnexa: Right adnexa normal.     Rectum: Normal.     Comments: Cervix posterior and viewed, pap obtained and sent. Musculoskeletal:        General: Normal range of motion.     Cervical back: Normal range of motion and neck supple.     Right lower leg: No edema.     Left lower leg: No edema.  Lymphadenopathy:     Head:     Right side of head: No submental, submandibular, tonsillar, preauricular or posterior auricular adenopathy.     Left side of head: No submental, submandibular, tonsillar, preauricular or posterior auricular adenopathy.     Cervical: No cervical adenopathy.  Skin:    General: Skin is warm and dry.     Capillary Refill: Capillary refill takes less than 2 seconds.     Findings: No rash.  Neurological:     Mental Status: She is alert and oriented to person, place, and time.     Cranial Nerves: Cranial nerves are intact.     Gait: Gait is intact.     Deep Tendon Reflexes: Reflexes are normal and symmetric.     Reflex Scores:      Brachioradialis reflexes are 2+ on the right side and 2+ on the left side.      Patellar reflexes are 2+ on the right side and 2+ on the left side. Psychiatric:        Attention and  Perception: Attention normal.        Mood and Affect: Mood normal.        Speech: Speech normal.        Behavior: Behavior normal. Behavior is cooperative.        Thought Content: Thought content normal.        Judgment: Judgment normal.    Results for orders placed or performed in visit on 04/16/20  Lipid Profile  Result Value Ref Range   Cholesterol, Total 200 (H) 100 - 199 mg/dL   Triglycerides 52 0 - 149 mg/dL   HDL 66 >39 mg/dL   VLDL Cholesterol Cal 10 5 - 40 mg/dL   LDL Chol Calc (NIH) 124 (H) 0 - 99 mg/dL   Chol/HDL Ratio 3.0 0.0 - 4.4 ratio  Basic Metabolic Panel (BMET)  Result Value Ref Range   Glucose 100 (H) 65 - 99 mg/dL   BUN 18 8 - 27 mg/dL   Creatinine, Ser 0.89 0.57 - 1.00 mg/dL   GFR calc non Af Amer 70 >59 mL/min/1.73   GFR calc Af Amer 80 >59 mL/min/1.73   BUN/Creatinine Ratio 20 12 - 28   Sodium 138 134 - 144 mmol/L   Potassium 4.2 3.5 - 5.2 mmol/L   Chloride 103 96 - 106 mmol/L   CO2 22 20 - 29 mmol/L   Calcium 9.3 8.7 - 10.3 mg/dL      Assessment & Plan:   Problem List Items Addressed This Visit       Cardiovascular and Mediastinum   Hypertension    Chronic, stable with BP near goal today and at goal on home BP readings.  Continue current medication regimen and adjust as needed.  Recommend she continue to monitor BP regularly at home and document +  focus on DASH diet.  CMP, TSH, CBC today.  Return to office in 6 months for follow-up.       Relevant Orders   CBC with Differential/Platelet   TSH     Respiratory   Asthma    Chronic, stable with minimal use of inhaler.  Continue current medication regimen and adjust as needed.  Spirometry next visit.         Digestive   GERD without esophagitis    Chronic, with as needed medication only.  Continue current regimen of OTC medication and alert provider if worsening. Check Mag level annually.       Relevant Orders   Magnesium     Nervous and Auditory   Peripheral neuropathy due to  chemotherapy (HCC)    Chronic, stable without medication at this time.  Has had side effects with Lyrica and Gabapentin, extreme fatigue.  Check B12 and Mag level today.         Musculoskeletal and Integument   Osteopenia of neck of right femur    Noted on DEXA 06/18/20 -- at this time continue supplements daily and educated patient at length on finding.  Plan on repeat DEXA 06/18/2025.  Check Vitamin D today.       Relevant Orders   VITAMIN D 25 Hydroxy (Vit-D Deficiency, Fractures)     Other   History of breast cancer    Continue to collaborate with oncology and review notes.       MGUS (monoclonal gammopathy of unknown significance)    Continue collaboration with oncology, recent notes reviewed.  Patient denies any pain today.       Prediabetes    Noted on past labs, recheck A1C today and continue diet focus.  Initiate medications as needed.       Relevant Orders   Comprehensive metabolic panel   HgB W4R   Hyperlipemia, mixed    Noted on labs LDL at 124 and ASCVD 5.8%.  Continue diet focus, provided education on this.  Lipid panel today.  Initiate medication as needed.       Relevant Orders   Comprehensive metabolic panel   Lipid Panel w/o Chol/HDL Ratio   Class 2 severe obesity due to excess calories with serious comorbidity and body mass index (BMI) of 35.0 to 35.9 in adult ([place]) - Primary    BMI 36.34.  Recommended eating smaller high protein, low fat meals more frequently and exercising 30 mins a day 5 times a week with a goal of 10-15lb weight loss in the next 3 months. Patient voiced their understanding and motivation to adhere to these recommendations.        Vitamin D deficiency    Chronic, stable with recent levels at goal.  Continue supplement and check level today.       Relevant Orders   VITAMIN D 25 Hydroxy (Vit-D Deficiency, Fractures)   Other Visit Diagnoses     B12 deficiency       History of low levels reported, check today and initiate  supplement as needed.   Relevant Orders   Vitamin B12   Cervical cancer screening       Pap obtained today and sent to lab.   Relevant Orders   Cytology - PAP   Annual physical exam       Annual physical today, labs ordered and reviewed health maintenance with patient.        Follow up plan: Return in about 6 months (around 04/03/2021) for HTN/HLD, ASTHMA,  MGUS, OSTEOPENIA -- spirometry needed.   LABORATORY TESTING:  - Pap smear: performed today  IMMUNIZATIONS:   - Tdap: Tetanus vaccination status reviewed: last tetanus booster within 10 years. - Influenza: Up to date - Pneumovax: Up to date - Prevnar: Not applicable - HPV: Not applicable - Zostavax vaccine: Up to date  SCREENING: -Mammogram: Up to date  - Colonoscopy: Up to date  - Bone Density: Not applicable  -Hearing Test: Not applicable  -Spirometry: Not applicable   PATIENT COUNSELING:   Advised to take 1 mg of folate supplement per day if capable of pregnancy.   Sexuality: Discussed sexually transmitted diseases, partner selection, use of condoms, avoidance of unintended pregnancy  and contraceptive alternatives.   Advised to avoid cigarette smoking.  I discussed with the patient that most people either abstain from alcohol or drink within safe limits (<=14/week and <=4 drinks/occasion for males, <=7/weeks and <= 3 drinks/occasion for females) and that the risk for alcohol disorders and other health effects rises proportionally with the number of drinks per week and how often a drinker exceeds daily limits.  Discussed cessation/primary prevention of drug use and availability of treatment for abuse.   Diet: Encouraged to adjust caloric intake to maintain  or achieve ideal body weight, to reduce intake of dietary saturated fat and total fat, to limit sodium intake by avoiding high sodium foods and not adding table salt, and to maintain adequate dietary potassium and calcium preferably from fresh fruits, vegetables,  and low-fat dairy products.    Stressed the importance of regular exercise  Injury prevention: Discussed safety belts, safety helmets, smoke detector, smoking near bedding or upholstery.   Dental health: Discussed importance of regular tooth brushing, flossing, and dental visits.    NEXT PREVENTATIVE PHYSICAL DUE IN 1 YEAR. Return in about 6 months (around 04/03/2021) for HTN/HLD, ASTHMA, MGUS, OSTEOPENIA -- spirometry needed.

## 2020-10-01 NOTE — Assessment & Plan Note (Addendum)
Noted on DEXA 06/18/20 -- at this time continue supplements daily and educated patient at length on finding.  Plan on repeat DEXA 06/18/2025.  Check Vitamin D today. 

## 2020-10-01 NOTE — Assessment & Plan Note (Signed)
Chronic, stable with BP near goal today and at goal on home BP readings.  Continue current medication regimen and adjust as needed.  Recommend she continue to monitor BP regularly at home and document + focus on DASH diet.  CMP, TSH, CBC today.  Return to office in 6 months for follow-up.

## 2020-10-01 NOTE — Assessment & Plan Note (Signed)
Chronic, with as needed medication only.  Continue current regimen of OTC medication and alert provider if worsening. Check Mag level annually.

## 2020-10-01 NOTE — Assessment & Plan Note (Signed)
Continue collaboration with oncology, recent notes reviewed.  Patient denies any pain today. 

## 2020-10-01 NOTE — Assessment & Plan Note (Signed)
Noted on labs LDL at 124 and ASCVD 5.8%.  Continue diet focus, provided education on this.  Lipid panel today.  Initiate medication as needed.

## 2020-10-01 NOTE — Assessment & Plan Note (Addendum)
#   MGUS-Jun 2021-1 gm/dl; reviewed natural history of MGUS; STABLE; 2022-M protein labs pending.  Otherwise hemoglobin stable at 10-11. STABLE.  No evidence of renal function or hypercalcemia.  # 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  Clinically no evidence of recurrence; April 2022- normal-stable complicated cyst.  Awaiting repeat mammogram in October 2022/Dr. Byrnett.  Continue surveillance- STABLE  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  Hb 10-11-STABLE  #Osteopenia : Reviewed the bone density  [April 2022-]T-score of -1.3; continue VitD. [hx of falls]; recommend resistance training [pt currently on @ home/videos]  # Obesity-  Discussed importance of healthy weight/and weight loss.  Strongly recommend eating more green leafy vegetables and cutting down processed food/ carbohydrates.  Instead increasing whole grains / protein in the diet.  Multiple studies have shown that optimal weight would help improve cardiovascular risk; also shown to cut on the risk of malignancies-colon cancer, breast cancer ovarian/uterine cancer in women and also prostate cancer in men.  Improvement of muscle mass has helped improved-osteoporosis.   # DISPOSITION:  # diagnostic mammo/US in October 2022.  # follow up in 6 months-MD /labs- cbc/cmp/ca-27-29/MM panel;K-L panel-Dr.B

## 2020-10-01 NOTE — Assessment & Plan Note (Signed)
Chronic, stable without medication at this time.  Has had side effects with Lyrica and Gabapentin, extreme fatigue.  Check B12 and Mag level today.

## 2020-10-01 NOTE — Assessment & Plan Note (Signed)
Chronic, stable with minimal use of inhaler.  Continue current medication regimen and adjust as needed.  Spirometry next visit.

## 2020-10-01 NOTE — Assessment & Plan Note (Signed)
Chronic, stable with recent levels at goal.  Continue supplement and check level today. 

## 2020-10-01 NOTE — Assessment & Plan Note (Signed)
Noted on past labs, recheck A1C today and continue diet focus.  Initiate medications as needed.

## 2020-10-02 LAB — CBC WITH DIFFERENTIAL/PLATELET
Basophils Absolute: 0 10*3/uL (ref 0.0–0.2)
Basos: 1 %
EOS (ABSOLUTE): 0.2 10*3/uL (ref 0.0–0.4)
Eos: 3 %
Hematocrit: 32.8 % — ABNORMAL LOW (ref 34.0–46.6)
Hemoglobin: 10.5 g/dL — ABNORMAL LOW (ref 11.1–15.9)
Immature Grans (Abs): 0 10*3/uL (ref 0.0–0.1)
Immature Granulocytes: 0 %
Lymphocytes Absolute: 2 10*3/uL (ref 0.7–3.1)
Lymphs: 31 %
MCH: 31.7 pg (ref 26.6–33.0)
MCHC: 32 g/dL (ref 31.5–35.7)
MCV: 99 fL — ABNORMAL HIGH (ref 79–97)
Monocytes Absolute: 0.5 10*3/uL (ref 0.1–0.9)
Monocytes: 8 %
Neutrophils Absolute: 3.7 10*3/uL (ref 1.4–7.0)
Neutrophils: 57 %
Platelets: 203 10*3/uL (ref 150–450)
RBC: 3.31 x10E6/uL — ABNORMAL LOW (ref 3.77–5.28)
RDW: 13.3 % (ref 11.7–15.4)
WBC: 6.5 10*3/uL (ref 3.4–10.8)

## 2020-10-02 LAB — VITAMIN D 25 HYDROXY (VIT D DEFICIENCY, FRACTURES): Vit D, 25-Hydroxy: 38.8 ng/mL (ref 30.0–100.0)

## 2020-10-02 LAB — KAPPA/LAMBDA LIGHT CHAINS
Kappa free light chain: 31.3 mg/L — ABNORMAL HIGH (ref 3.3–19.4)
Kappa, lambda light chain ratio: 2.35 — ABNORMAL HIGH (ref 0.26–1.65)
Lambda free light chains: 13.3 mg/L (ref 5.7–26.3)

## 2020-10-02 LAB — COMPREHENSIVE METABOLIC PANEL
ALT: 10 IU/L (ref 0–32)
AST: 11 IU/L (ref 0–40)
Albumin/Globulin Ratio: 1.4 (ref 1.2–2.2)
Albumin: 4.2 g/dL (ref 3.8–4.8)
Alkaline Phosphatase: 95 IU/L (ref 44–121)
BUN/Creatinine Ratio: 29 — ABNORMAL HIGH (ref 12–28)
BUN: 27 mg/dL (ref 8–27)
Bilirubin Total: 0.3 mg/dL (ref 0.0–1.2)
CO2: 22 mmol/L (ref 20–29)
Calcium: 9.4 mg/dL (ref 8.7–10.3)
Chloride: 104 mmol/L (ref 96–106)
Creatinine, Ser: 0.94 mg/dL (ref 0.57–1.00)
Globulin, Total: 2.9 g/dL (ref 1.5–4.5)
Glucose: 88 mg/dL (ref 65–99)
Potassium: 4.2 mmol/L (ref 3.5–5.2)
Sodium: 139 mmol/L (ref 134–144)
Total Protein: 7.1 g/dL (ref 6.0–8.5)
eGFR: 69 mL/min/{1.73_m2} (ref >59)

## 2020-10-02 LAB — LIPID PANEL W/O CHOL/HDL RATIO
Cholesterol, Total: 146 mg/dL (ref 100–199)
HDL: 67 mg/dL (ref >39)
LDL Chol Calc (NIH): 68 mg/dL (ref 0–99)
Triglycerides: 52 mg/dL (ref 0–149)
VLDL Cholesterol Cal: 11 mg/dL (ref 5–40)

## 2020-10-02 LAB — CANCER ANTIGEN 27.29: CA 27.29: 19.6 U/mL (ref 0.0–38.6)

## 2020-10-02 LAB — HEMOGLOBIN A1C
Est. average glucose Bld gHb Est-mCnc: 123 mg/dL
Hgb A1c MFr Bld: 5.9 % — ABNORMAL HIGH (ref 4.8–5.6)

## 2020-10-02 LAB — MAGNESIUM: Magnesium: 1.9 mg/dL (ref 1.6–2.3)

## 2020-10-02 LAB — VITAMIN B12: Vitamin B-12: 649 pg/mL (ref 232–1245)

## 2020-10-02 LAB — TSH: TSH: 1.62 u[IU]/mL (ref 0.450–4.500)

## 2020-10-02 NOTE — Progress Notes (Signed)
Contacted via [place] afternoon Delsie, your blood work has returned and everything is staying at baseline or within normal ranges.  You continue to show anemia being present, but this is stable and not worsening. Continue to visit with oncology.  The A1C is the diabetes testing we talked about, this looks at your blood sugars over the past 3 months and turns the average into a number.  Your number is 5.9%, meaning you are prediabetic.  Any number 5.7 to 6.4 is considered prediabetes and any number 6.5 or greater is considered diabetes.  I would recommend heavy focus on decreasing foods high in sugar and your intake of things like bread products, pasta, and rice.  The American Diabetes Association online has a large amount of information on diet changes to make.  We will recheck this number in 6 months to ensure you are not continuing to trend upwards and move into diabetes.  Have a good day.  Any questions? Keep being awesome!!  Thank you for allowing me to participate in your care.  I appreciate you. Kindest regards, Valerie Cones

## 2020-10-03 LAB — CYTOLOGY - PAP
Comment: NEGATIVE
Diagnosis: NEGATIVE
High risk HPV: NEGATIVE

## 2020-10-03 NOTE — Progress Notes (Signed)
Contacted via [place] afternoon [place], your pap has returned negative.  Great news!!  Unless you wish to continue paps after age 60 this should be last pap unless you have some irregular bleeding show up or symptoms in future.  Have a great day!!

## 2020-10-05 LAB — MULTIPLE MYELOMA PANEL, SERUM
Albumin SerPl Elph-Mcnc: 4 g/dL (ref 2.9–4.4)
Albumin/Glob SerPl: 1.1 (ref 0.7–1.7)
Alpha 1: 0.2 g/dL (ref 0.0–0.4)
Alpha2 Glob SerPl Elph-Mcnc: 0.9 g/dL (ref 0.4–1.0)
B-Globulin SerPl Elph-Mcnc: 1.1 g/dL (ref 0.7–1.3)
Gamma Glob SerPl Elph-Mcnc: 1.6 g/dL (ref 0.4–1.8)
Globulin, Total: 3.8 g/dL (ref 2.2–3.9)
IgA: 152 mg/dL (ref 87–352)
IgG (Immunoglobin G), Serum: 2021 mg/dL — ABNORMAL HIGH (ref 586–1602)
IgM (Immunoglobulin M), Srm: 48 mg/dL (ref 26–217)
M Protein SerPl Elph-Mcnc: 1.1 g/dL — ABNORMAL HIGH
Total Protein ELP: 7.8 g/dL (ref 6.0–8.5)

## 2020-10-08 ENCOUNTER — Other Ambulatory Visit: Payer: Self-pay

## 2020-10-08 ENCOUNTER — Ambulatory Visit: Payer: Managed Care, Other (non HMO) | Admitting: Dermatology

## 2020-10-08 DIAGNOSIS — L2081 Atopic neurodermatitis: Secondary | ICD-10-CM

## 2020-10-08 NOTE — Progress Notes (Signed)
   Follow-Up Visit   Subjective  Adrienne [name] is a 60 y.o. female who presents for the following: Dermatitis (Back, 20mf/u Dupixent '300mg'$ /225mq 2 wks, Clobetasol/Cerave mix prn, Tacrolimus oint prn) and rash (R buttocks, no symptoms, husband noticed, has used cerave on it). No side effects from injections, no persistent eye symptoms.  Rash has cleared and no longer itching  The following portions of the chart were reviewed this encounter and updated as appropriate:       Review of Systems:  No other skin or systemic complaints except as noted in HPI or Assessment and Plan.  Objective  Well appearing patient in no apparent distress; mood and affect are within normal limits.  A focused examination was performed including arms, trunk, buttocks. Relevant physical exam findings are noted in the Assessment and Plan.  back, arms, legs Mild hyperpigmented patches bil popliteal, mid and lower back, scaly patches bil medial buttocks   Assessment & Plan  Atopic neurodermatitis back, arms, legs  Improved on Dupixent, pt no longer itching  Atopic dermatitis - Severe, on Dupixent (biologic medication).  Atopic dermatitis (eczema) is a chronic, relapsing, pruritic condition that can significantly affect quality of life. It is often associated with allergic rhinitis and/or asthma and can require treatment with topical medications, phototherapy, or in severe cases a biologic medication called Dupixent.      Cont Dupixent '300mg'$ /60m78mq injections q 2 wks Cont Clobetasol/Cerave mix qd/bid prn flares, pt will start using on buttocks bid for up to 2 weeks Cont Tacrolimus 0.1% oint qd/bid prn flares Cont Cerave cream after shower/bath Recommend mild soap and moisturizing cream 1-2 times daily.    Dupilumab (Dupixent) is a treatment given by injection for adults with moderate-to-severe atopic dermatitis. Goal is control of skin condition, not cure. It is given as 2 injections at the first dose  followed by 1 injection ever 2 weeks thereafter.  Potential side effects include allergic reaction, herpes infections, injection site reactions and conjunctivitis (inflammation of the eyes).  The use of Dupixent requires long term medication management, including periodic office visits.   Topical steroids (such as triamcinolone, fluocinolone, fluocinonide, mometasone, clobetasol, halobetasol, betamethasone, hydrocortisone) can cause thinning and lightening of the skin if they are used for too long in the same area. Your physician has selected the right strength medicine for your problem and area affected on the body. Please use your medication only as directed by your physician to prevent side effects.     Return in about 6 months (around 04/10/2021) for Atopic Derm.  I, SonOthelia PullingMA, am acting as scribe for TarBrendolyn PattyD .  Documentation: I have reviewed the above documentation for accuracy and completeness, and I agree with the above.  TarBrendolyn Patty

## 2020-10-08 NOTE — Patient Instructions (Signed)

## 2020-10-09 ENCOUNTER — Other Ambulatory Visit: Payer: Self-pay | Admitting: Nurse Practitioner

## 2020-10-09 DIAGNOSIS — I10 Essential (primary) hypertension: Secondary | ICD-10-CM

## 2020-10-09 NOTE — Telephone Encounter (Signed)
Requested Prescriptions  Pending Prescriptions Disp Refills  . lisinopril (ZESTRIL) 10 MG tablet [Pharmacy Med Name: Lisinopril 10 MG Oral Tablet] 90 tablet 1    Sig: TAKE 1 TABLET BY MOUTH  DAILY     Cardiovascular:  ACE Inhibitors Passed - 10/09/2020  7:18 AM      Passed - Cr in normal range and within 180 days    Creatinine  Date Value Ref Range Status  07/16/2011 0.95 0.60 - 1.30 mg/dL Final   Creatinine, Ser  Date Value Ref Range Status  10/01/2020 0.94 0.57 - 1.00 mg/dL Final         Passed - K in normal range and within 180 days    Potassium  Date Value Ref Range Status  10/01/2020 4.2 3.5 - 5.2 mmol/L Final  07/16/2011 3.7 3.5 - 5.1 mmol/L Final         Passed - Patient is not pregnant      Passed - Last BP in normal range    BP Readings from Last 1 Encounters:  10/01/20 132/72         Passed - Valid encounter within last 6 months    Recent Outpatient Visits          1 week ago Class 2 severe obesity due to excess calories with serious comorbidity and body mass index (BMI) of 35.0 to 35.9 in adult [facility])   [place], Eldridge T, NP   5 months ago [place] Vaugh[place], Morrow T, NP   5 months ago Class 2 severe obesity due to excess calories with serious comorbidity and body mass index (BMI) of 35.0 to 35.9 in adult [facility])   [place], Henrine Screws T, NP   1 year ago Routine general medical examination at a health care facility   [place], Henrine Screws T, NP   2 years ago Encounter for annual physical exam   [facility] Venita Lick, NP      Future Appointments            In 5 months Venita Lick, NP [employer], [place]   In 6 months Brendolyn Patty, MD [place]           . albuterol (VENTOLIN HFA) 108 (90 Base) MCG/ACT inhaler [Pharmacy Med Name: ALBUTEROL HFA (PV)] 26.8 g 3    Sig: USE 2 INHALATIONS BY MOUTH  EVERY 6 HOURS AS NEEDED FOR  WHEEZING OR SHORTNESS OF  BREATH     Pulmonology:  Beta Agonists Failed - 10/09/2020  7:18 AM      Failed - One inhaler should last at least one month. If the patient is requesting refills earlier, contact the patient to check for uncontrolled symptoms.      Passed - Valid encounter within last 12 months    Recent Outpatient Visits          1 week ago Class 2 severe obesity due to excess calories with serious comorbidity and body mass index (BMI) of 35.0 to 35.9 in adult [facility])   [place], [place] T, NP   5 months ago [place], [place] T, NP   5 months ago Class 2 severe obesity due to excess calories with serious comorbidity and body mass index (BMI) of 35.0 to 35.9 in adult [facility])   [place], Henrine Screws T, NP   1 year ago Routine general  medical examination at a health care facility   [place], [place] T, NP   2 years ago Encounter for annual physical exam   [facility] Venita Lick, NP      Future Appointments            In 5 months [place], Barbaraann Faster, NP [employer], [place]   In 6 months Brendolyn Patty, MD [place]

## 2020-10-29 ENCOUNTER — Telehealth: Payer: Self-pay | Admitting: Nurse Practitioner

## 2020-10-29 NOTE — Telephone Encounter (Signed)
Pt came in and dropped off a DM[employer] parking placard form to be completed by the provider.  Upon completion pt would like to be called to pick up the form.  Form placed in providers folder for completion.

## 2020-11-01 NOTE — Telephone Encounter (Signed)
Paperwork was placed in the wrong bin and paperwork has been placed in the correct folder for provider signature.

## 2020-11-01 NOTE — Telephone Encounter (Signed)
Spoke with patient and notified her that her Handicap Placard was ready for pick up. Patient verbalized understanding.

## 2020-11-01 NOTE — Telephone Encounter (Signed)
Completed and placed in nurse bin for completion and to alert patient for pick-up

## 2020-12-14 ENCOUNTER — Ambulatory Visit: Payer: Managed Care, Other (non HMO) | Admitting: Nurse Practitioner

## 2020-12-14 ENCOUNTER — Ambulatory Visit: Payer: Managed Care, Other (non HMO) | Admitting: Family Medicine

## 2020-12-14 ENCOUNTER — Encounter: Payer: Self-pay | Admitting: Nurse Practitioner

## 2020-12-14 ENCOUNTER — Other Ambulatory Visit: Payer: Self-pay

## 2020-12-14 VITALS — BP 111/72 | HR 82 | Temp 98.1°F | Wt 221.4 lb

## 2020-12-14 DIAGNOSIS — G8929 Other chronic pain: Secondary | ICD-10-CM | POA: Diagnosis not present

## 2020-12-14 DIAGNOSIS — Z23 Encounter for immunization: Secondary | ICD-10-CM

## 2020-12-14 DIAGNOSIS — M25511 Pain in right shoulder: Secondary | ICD-10-CM | POA: Diagnosis not present

## 2020-12-14 NOTE — Progress Notes (Signed)
BP 111/72   Pulse 82   Temp 98.1 F (36.7 C) (Oral)   Wt 221 lb 6.4 oz (100.4 kg)   LMP  (LMP Unknown)   SpO2 98%   BMI 36.84 kg/m    Subjective:    Patient ID: Adrienne [name], female    DOB: [birth year], 60 y.o.   MRN: [id number]  HPI: Adrienne [name] is a 60 y.o. female  Chief Complaint  Patient presents with   Shoulder Pain    Patient is here for R shoulder pain. Patient states she has been having this issues for a while and states her and provider discuss the issues back in July at her last visit and she was not give the shot at that visit. Patient states she would like to discuss the next recommended steps at it hurts really bad. Patient denies seeing an Orthopedic doctor. Patient states if she did not have the left side of her body then she would not know if she would be able to use her body at all. Patient states she has    Flu Vaccine    Patient would like to discuss having the Flu Vaccine at today's visit with provider.   SHOULDER PAIN To right shoulder ongoing since before July.  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  Takes Aleeve and ice pack at work to help.  She is right hand dominant, reports if she did not have left side she would be in trouble.   Duration: months Involved shoulder: right Mechanism of injury:  overuse Location: lateral Onset:gradual Severity: 8/10  Quality:  dull, aching, and throbbing Frequency: intermittent Radiation: no Aggravating factors: lifting and movement  Alleviating factors: ice, NSAIDs, and rest  Status: fluctuating Treatments attempted: rest, ice, aleve, and physical therapy  Relief with NSAIDs?:  moderate Weakness: no Numbness: no Decreased grip strength: no Redness: no Swelling: no Bruising: no Fevers: no   Relevant past medical, surgical, family and social history reviewed and updated as indicated. Interim medical history since our last visit  reviewed. Allergies and medications reviewed and updated.  Review of Systems  Constitutional:  Negative for activity change, appetite change, diaphoresis, fatigue and fever.  Respiratory:  Negative for cough, chest tightness and shortness of breath.   Cardiovascular:  Negative for chest pain, palpitations and leg swelling.  Gastrointestinal: Negative.   Endocrine: Negative.   Musculoskeletal:  Positive for arthralgias.  Neurological: Negative.   Psychiatric/Behavioral: Negative.     Per HPI unless specifically indicated above     Objective:    BP 111/72   Pulse 82   Temp 98.1 F (36.7 C) (Oral)   Wt 221 lb 6.4 oz (100.4 kg)   LMP  (LMP Unknown)   SpO2 98%   BMI 36.84 kg/m   Wt Readings from Last 3 Encounters:  12/14/20 221 lb 6.4 oz (100.4 kg)  10/01/20 217 lb (98.4 kg)  10/01/20 218 lb 6.4 oz (99.1 kg)    Physical Exam Vitals and nursing note reviewed.  Constitutional:      General: She is awake. She is not in acute distress.    Appearance: She is well-developed and well-groomed. She is obese. She is not ill-appearing.  HENT:     Head: Normocephalic.     Right Ear: Hearing normal.     Left Ear: Hearing normal.  Eyes:     General: Lids are normal.  Right eye: No discharge.        Left eye: No discharge.     Conjunctiva/sclera: Conjunctivae normal.     Pupils: Pupils are equal, round, and reactive to light.  Neck:     Thyroid: No thyromegaly.     Vascular: No carotid bruit.  Cardiovascular:     Rate and Rhythm: Normal rate and regular rhythm.     Heart sounds: Normal heart sounds. No murmur heard.   No gallop.  Pulmonary:     Effort: Pulmonary effort is normal. No accessory muscle usage or respiratory distress.     Breath sounds: Normal breath sounds.  Abdominal:     General: Bowel sounds are normal.     Palpations: Abdomen is soft. There is no hepatomegaly or splenomegaly.  Musculoskeletal:     Right shoulder: Tenderness and crepitus present. No  swelling, effusion, laceration or bony tenderness. Decreased range of motion. Decreased strength.     Left shoulder: Normal.     Cervical back: Normal range of motion and neck supple.     Right lower leg: No edema.     Left lower leg: No edema.     Comments: Decreased ROM with discomfort to all aspects of ROM.  Skin:    General: Skin is warm and dry.  Neurological:     Mental Status: She is alert and oriented to person, place, and time.  Psychiatric:        Attention and Perception: Attention normal.        Mood and Affect: Mood normal.        Speech: Speech normal.        Behavior: Behavior normal. Behavior is cooperative.        Thought Content: Thought content normal.   STEROID INJECTION Procedure: Right Shoulder Intraarticular Steroid Injection  Description: After verbal consent and patient education on injection, area prepped and draped using  semi-sterile technique. Using a posterior approach, a mixture of 5 ml lidocaine (Xylocaine)  (with epi not available) + 1 ml of triamcinolone acetonide 24m/ml (Kenalog) using a 1.5-inch, 21 gauge needle was injected into right shoulder joint.  A bandage was then placed over the injection site. Complications:  none Post Procedure Instructions: To the ER if any symptoms of erythema or swelling.   Follow Up: PRN   Results for orders placed or performed in visit on 10/01/20  Cancer antigen 27.29  Result Value Ref Range   CA 27.29 19.6 0.0 - 38.6 U/mL  Kappa/lambda light chains  Result Value Ref Range   Kappa free light chain 31.3 (H) 3.3 - 19.4 mg/L   Lambda free light chains 13.3 5.7 - 26.3 mg/L   Kappa, lambda light chain ratio 2.35 (H) 0.26 - 1.65  Multiple Myeloma Panel (SPEP&IFE w/QIG)  Result Value Ref Range   IgG (Immunoglobin G), Serum 2,021 (H) 586 - 1,602 mg/dL   IgA 152 87 - 352 mg/dL   IgM (Immunoglobulin M), Srm 48 26 - 217 mg/dL   Total Protein ELP 7.8 6.0 - 8.5 g/dL   Albumin SerPl Elph-Mcnc 4.0 2.9 - 4.4 g/dL   Alpha 1  0.2 0.0 - 0.4 g/dL   Alpha2 Glob SerPl Elph-Mcnc 0.9 0.4 - 1.0 g/dL   B-Globulin SerPl Elph-Mcnc 1.1 0.7 - 1.3 g/dL   Gamma Glob SerPl Elph-Mcnc 1.6 0.4 - 1.8 g/dL   M Protein SerPl Elph-Mcnc 1.1 (H) Not Observed g/dL   Globulin, Total 3.8 2.2 - 3.9 g/dL   Albumin/Glob SerPl 1.1  0.7 - 1.7   IFE 1 Comment (A)    Please Note Comment       Assessment & Plan:   Problem List Items Addressed This Visit       Other   Chronic right shoulder pain - Primary    Ongoing for > 3 months.  Decreased ROM present and tenderness posterior and laterally.  At this time will obtain imaging of area and provided steroid injection today for comfort.  Educated on risks and benefits of injection + possible side effects.  She is aware if any increased redness or swelling to immediately go to ER.  Verbally consented to injection and this was provided today.  Return in 2 weeks for follow-up, if ongoing then refer to ortho and obtain MRI.      Relevant Orders   DG Shoulder Right   Other Visit Diagnoses     Flu vaccine need       Flu vaccine today   Relevant Orders   Flu Vaccine QUAD 6+ mos PF IM (Fluarix Quad PF) (Completed)        Follow up plan: Return in about 2 weeks (around 12/28/2020) for Right Shoulder Pain.

## 2020-12-14 NOTE — Patient Instructions (Signed)
Joint Steroid Injection A joint steroid injection is a procedure to relieve swelling and pain in a joint. Steroids are medicines that reduce inflammation. In this procedure, your health care provider uses a syringe and a needle to inject a steroid medicine into a painful and inflamed joint. A pain-relieving medicine (anesthetic) may be injected along with the steroid. In some cases, your health care provider may use an imaging technique such as ultrasound or fluoroscopy toguide the injection. Joints that are often treated with steroid injections include the knee, shoulder, hip, and spine. These injections may also be used in the elbow, ankle, and joints of the hands or feet. You may have joint steroid injections as part of your treatment for inflammation caused by: Gout. Rheumatoid arthritis. Advanced wear-and-tear arthritis (osteoarthritis). Tendinitis. Bursitis. Joint steroid injections may be repeated, but having them too often can damage a joint or the skin over the joint. You should not have joint steroidinjections less than 6 weeks apart or more than four times a year. Tell a health care provider about: Any allergies you have. All medicines you are taking, including vitamins, herbs, eye drops, creams, and over-the-counter medicines. Any problems you or family members have had with anesthetic medicines. Any blood disorders you have. Any surgeries you have had. Any medical conditions you have. Whether you are pregnant or may be pregnant. What are the risks? Generally, this is a safe treatment. However, problems may occur, including: Infection. Bleeding. Allergic reactions to medicines. Damage to the joint or tissues around the joint. Thinning of skin or loss of skin color over the joint. Temporary flushing of the face or chest. Temporary increase in pain. Temporary increase in blood sugar. Failure to relieve inflammation or pain. What happens before the treatment? Medicines Ask  your health care provider about: Changing or stopping your regular medicines. This is especially important if you are taking diabetes medicines or blood thinners. Taking medicines such as aspirin and ibuprofen. These medicines can thin your blood. Do not take these medicines unless your health care provider tells you to take them. Taking over-the-counter medicines, vitamins, herbs, and supplements. General instructions You may have imaging tests of your joint. Ask your health care provider if you can drive yourself home after the procedure. What happens during the treatment?  Your health care provider will position you for the injection and locate the injection site over your joint. The skin over the joint will be cleaned with a germ-killing soap. Your health care provider may: Spray a numbing solution (topical anesthetic) over the injection site. Inject a local anesthetic under the skin above your joint. The needle will be placed through your skin into your joint. Your health care provider may use imaging to guide the needle to the right spot for the injection. If imaging is used, a special contrast dye may be injected to confirm that the needle is in the correct location. The steroid medicine will be injected into your joint. Anesthetic may be injected along with the steroid. This may be a medicine that relieves pain for a short time (short-acting anesthetic) or for a longer time (long-acting anesthetic). The needle will be removed, and an adhesive bandage (dressing) will be placed over the injection site. The procedure may vary among health care providers and hospitals. What can I expect after the treatment? You will be able to go home after the treatment. It is normal to feel slight flushing for a few days after the injection. After the treatment, it is common to   have an increase in joint pain after the anesthetic has worn off. This may happen about an hour after a short-acting anesthetic  or about 8 hours after a longer-acting anesthetic. You should begin to feel relief from joint pain and swelling after 24 to 48 hours. Contact your health care provider if you do not begin to feel relief after 2 days. Follow these instructions at home: Injection site care Leave the adhesive dressing over your injection site in place until your health care provider says you can remove it. Check your injection site every day for signs of infection. Check for: More redness, swelling, or pain. Fluid or blood. Warmth. Pus or a bad smell. Activity Return to your normal activities as told by your health care provider. Ask your health care provider what activities are safe for you. You may be asked to limit activities that put stress on the joint for a few days. Do joint exercises as told by your health care provider. Do not take baths, swim, or use a hot tub until your health care provider approves. Ask your health care provider if you may take showers. You may only be allowed to take sponge baths. Managing pain, stiffness, and swelling  If directed, put ice on the joint. To do this: Put ice in a plastic bag. Place a towel between your skin and the bag. Leave the ice on for 20 minutes, 2-3 times a day. Remove the ice if your skin turns bright red. This is very important. If you cannot feel pain, heat, or cold, you have a greater risk of damage to the area. Raise (elevate) your joint above the level of your heart when you are sitting or lying down.  General instructions Take over-the-counter and prescription medicines only as told by your health care provider. Do not use any products that contain nicotine or tobacco, such as cigarettes, e-cigarettes, and chewing tobacco. These can delay joint healing. If you need help quitting, ask your health care provider. If you have diabetes, be aware that your blood sugar may be slightly elevated for several days after the injection. Keep all follow-up visits.  This is important. Contact a health care provider if you have: Chills or a fever. Any signs of infection at your injection site. Increased pain or swelling or no relief after 2 days. Summary A joint steroid injection is a treatment to relieve pain and swelling in a joint. Steroids are medicines that reduce inflammation. Your health care provider may add an anesthetic along with the steroid. You may have joint steroid injections as part of your arthritis treatment. Joint steroid injections may be repeated, but having them too often can damage a joint or the skin over the joint. Contact your health care provider if you have a fever, chills, or signs of infection, or if you get no relief from joint pain or swelling. This information is not intended to replace advice given to you by your health care provider. Make sure you discuss any questions you have with your healthcare provider. Document Revised: 08/05/2019 Document Reviewed: 08/05/2019 Elsevier Patient Education  2022 [employer]  

## 2020-12-14 NOTE — Assessment & Plan Note (Signed)
Ongoing for > 3 months.  Decreased ROM present and tenderness posterior and laterally.  At this time will obtain imaging of area and provided steroid injection today for comfort.  Educated on risks and benefits of injection + possible side effects.  She is aware if any increased redness or swelling to immediately go to ER.  Verbally consented to injection and this was provided today.  Return in 2 weeks for follow-up, if ongoing then refer to ortho and obtain MRI.

## 2020-12-17 ENCOUNTER — Other Ambulatory Visit: Payer: Self-pay

## 2020-12-17 ENCOUNTER — Ambulatory Visit
Admission: RE | Admit: 2020-12-17 | Discharge: 2020-12-17 | Disposition: A | Discharge status: Home / Self Care | Payer: Managed Care, Other (non HMO) | Source: Ambulatory Visit | Attending: Nurse Practitioner | Admitting: Nurse Practitioner

## 2020-12-17 ENCOUNTER — Ambulatory Visit
Admission: RE | Admit: 2020-12-17 | Discharge: 2020-12-17 | Disposition: A | Discharge status: Home / Self Care | Payer: Managed Care, Other (non HMO) | Attending: Nurse Practitioner | Admitting: Nurse Practitioner

## 2020-12-17 DIAGNOSIS — G8929 Other chronic pain: Secondary | ICD-10-CM | POA: Insufficient documentation

## 2020-12-17 DIAGNOSIS — M25511 Pain in right shoulder: Secondary | ICD-10-CM

## 2020-12-18 ENCOUNTER — Telehealth: Payer: Self-pay

## 2020-12-18 DIAGNOSIS — M25511 Pain in right shoulder: Secondary | ICD-10-CM

## 2020-12-18 DIAGNOSIS — G8929 Other chronic pain: Secondary | ICD-10-CM

## 2020-12-18 NOTE — Progress Notes (Signed)
Please let Ms. Meloche know her imaging returned showing some irregularity of the right shoulder which appears to indicated some rotator cuff tendon irritation.  How is the injection working?  Any improvement in pain?  If not then I recommend next steps would be to get you into orthopedics.  Let me know how you are doing. Keep being amazing!!  Thank you for allowing me to participate in your care.  I appreciate you. Kindest regards, Bertrice Leder

## 2020-12-18 NOTE — Telephone Encounter (Signed)
Spoke with patient about results from xray. She did state that the pain was a little better however would still like to be seen by orthopedics. Referral is T'ed up here waiting for you to sign.

## 2020-12-19 ENCOUNTER — Other Ambulatory Visit: Payer: Self-pay | Admitting: Dermatology

## 2020-12-19 DIAGNOSIS — L309 Dermatitis, unspecified: Secondary | ICD-10-CM

## 2020-12-31 ENCOUNTER — Ambulatory Visit: Payer: Managed Care, Other (non HMO) | Admitting: Nurse Practitioner

## 2020-12-31 ENCOUNTER — Other Ambulatory Visit: Payer: Self-pay

## 2020-12-31 ENCOUNTER — Encounter: Payer: Self-pay | Admitting: Nurse Practitioner

## 2020-12-31 DIAGNOSIS — G8929 Other chronic pain: Secondary | ICD-10-CM

## 2020-12-31 DIAGNOSIS — M25511 Pain in right shoulder: Secondary | ICD-10-CM | POA: Diagnosis not present

## 2020-12-31 NOTE — Progress Notes (Signed)
BP 113/80   Pulse 88   Temp 98.2 F (36.8 C) (Oral)   Wt 216 lb 6.4 oz (98.2 kg)   LMP  (LMP Unknown)   SpO2 98%   BMI 36.01 kg/m    Subjective:    Patient ID: Adrienne [name], female    DOB: 1960-09-02, 60 y.o.   MRN: [id number]  HPI: Adrienne [name] is a 60 y.o. female  Chief Complaint  Patient presents with   Shoulder Pain    Patient states she is doing better since the injection. Patient states she did received a letter in the mail today and to inform her to reach out to [facility] for an appointment. Patient states she would like to discuss recent imaging as she heard her imaging was not the best.    SHOULDER PAIN Follow-up for right shoulder pain.  Ongoing since before July.  Had steroid injection to shoulder on 12/14/20, which she reports assisted with pain.  She is using [employer] while at work on occasion.  Is still having some discomfort when reaching up for items, but other then that improved pain.    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  Takes Aleeve and ice pack at work to help.  She is right hand dominant, reports if she did not have left side she would be in trouble.  Imaging on 12/17/20 noted "Irregularity of the greater tuberosity indicative of rotator cuff tendinopathy" and and ortho referral was placed. Duration: months Involved shoulder: right Mechanism of injury:  overuse Location: lateral Onset:gradual Severity: 0/10 Quality:  improved with occasional ache Frequency: intermittent Radiation: no Aggravating factors: lifting and movement  Alleviating factors: ice, NSAIDs, and rest , injection Status: fluctuating Treatments attempted: rest, ice, aleve, and physical therapy , injection Relief with NSAIDs?:  moderate Weakness: no Numbness: no Decreased grip strength: no Redness: no Swelling: no Bruising: no Fevers: no   Relevant past medical, surgical, family and social history reviewed  and updated as indicated. Interim medical history since our last visit reviewed. Allergies and medications reviewed and updated.  Review of Systems  Constitutional:  Negative for activity change, appetite change, diaphoresis, fatigue and fever.  Respiratory:  Negative for cough, chest tightness and shortness of breath.   Cardiovascular:  Negative for chest pain, palpitations and leg swelling.  Gastrointestinal: Negative.   Endocrine: Negative.   Musculoskeletal:  Positive for arthralgias.  Neurological: Negative.   Psychiatric/Behavioral: Negative.     Per HPI unless specifically indicated above     Objective:    BP 113/80   Pulse 88   Temp 98.2 F (36.8 C) (Oral)   Wt 216 lb 6.4 oz (98.2 kg)   LMP  (LMP Unknown)   SpO2 98%   BMI 36.01 kg/m   Wt Readings from Last 3 Encounters:  12/31/20 216 lb 6.4 oz (98.2 kg)  12/14/20 221 lb 6.4 oz (100.4 kg)  10/01/20 217 lb (98.4 kg)    Physical Exam Vitals and nursing note reviewed.  Constitutional:      General: She is awake. She is not in acute distress.    Appearance: She is well-developed and well-groomed. She is obese. She is not ill-appearing.  HENT:     Head: Normocephalic.     Right Ear: Hearing normal.     Left Ear: Hearing normal.  Eyes:     General: Lids are normal.  Right eye: No discharge.        Left eye: No discharge.     Conjunctiva/sclera: Conjunctivae normal.     Pupils: Pupils are equal, round, and reactive to light.  Neck:     Thyroid: No thyromegaly.     Vascular: No carotid bruit.  Cardiovascular:     Rate and Rhythm: Normal rate and regular rhythm.     Heart sounds: Normal heart sounds. No murmur heard.   No gallop.  Pulmonary:     Effort: Pulmonary effort is normal. No accessory muscle usage or respiratory distress.     Breath sounds: Normal breath sounds.  Abdominal:     General: Bowel sounds are normal.     Palpations: Abdomen is soft. There is no hepatomegaly or splenomegaly.   Musculoskeletal:     Right shoulder: Tenderness and crepitus present. No swelling, effusion, laceration or bony tenderness. Normal range of motion. Normal strength.     Left shoulder: Normal.     Cervical back: Normal range of motion and neck supple.     Right lower leg: No edema.     Left lower leg: No edema.     Comments: No rashes  Skin:    General: Skin is warm and dry.  Neurological:     Mental Status: She is alert and oriented to person, place, and time.  Psychiatric:        Attention and Perception: Attention normal.        Mood and Affect: Mood normal.        Speech: Speech normal.        Behavior: Behavior normal. Behavior is cooperative.        Thought Content: Thought content normal.   Results for orders placed or performed in visit on 10/01/20  Cancer antigen 27.29  Result Value Ref Range   CA 27.29 19.6 0.0 - 38.6 U/mL  Kappa/lambda light chains  Result Value Ref Range   Kappa free light chain 31.3 (H) 3.3 - 19.4 mg/L   Lambda free light chains 13.3 5.7 - 26.3 mg/L   Kappa, lambda light chain ratio 2.35 (H) 0.26 - 1.65  Multiple Myeloma Panel (SPEP&IFE w/QIG)  Result Value Ref Range   IgG (Immunoglobin G), Serum 2,021 (H) 586 - 1,602 mg/dL   IgA 152 87 - 352 mg/dL   IgM (Immunoglobulin M), Srm 48 26 - 217 mg/dL   Total Protein ELP 7.8 6.0 - 8.5 g/dL   Albumin SerPl Elph-Mcnc 4.0 2.9 - 4.4 g/dL   Alpha 1 0.2 0.0 - 0.4 g/dL   Alpha2 Glob SerPl Elph-Mcnc 0.9 0.4 - 1.0 g/dL   B-Globulin SerPl Elph-Mcnc 1.1 0.7 - 1.3 g/dL   Gamma Glob SerPl Elph-Mcnc 1.6 0.4 - 1.8 g/dL   M Protein SerPl Elph-Mcnc 1.1 (H) Not Observed g/dL   Globulin, Total 3.8 2.2 - 3.9 g/dL   Albumin/Glob SerPl 1.1 0.7 - 1.7   IFE 1 Comment (A)    Please Note Comment       Assessment & Plan:   Problem List Items Addressed This Visit       Other   Chronic right shoulder pain    Ongoing for > 3 months with improvement after recent injection.  However still with tenderness posterior and  laterally.  Recommend she schedule with ortho, referral is in place and she has number.  She plans on calling to schedule visit.  Would benefit further assessment and recommendations.  Follow up plan: Return for as scheduled in January.

## 2020-12-31 NOTE — Assessment & Plan Note (Signed)
Ongoing for > 3 months with improvement after recent injection.  However still with tenderness posterior and laterally.  Recommend she schedule with ortho, referral is in place and she has number.  She plans on calling to schedule visit.  Would benefit further assessment and recommendations.

## 2020-12-31 NOTE — Patient Instructions (Signed)
Rotator Cuff Tendinitis Rotator cuff tendinitis is inflammation of the tendons in the rotator cuff. Tendons are tough, cord-like bands that connect muscle to bone. The rotator cuff includes all of the muscles and tendons that connect the arm to the shoulder. The rotator cuff holds the head of the humerus, or the upper arm bone, in the cup of the shoulder blade (scapula). This condition can lead to a long-term or chronic tear. The tear may be partial or complete. What are the causes? This condition is usually caused by overusing the rotator cuff. What increases the risk? This condition is more likely to develop in athletes and workers who frequently use their shoulder or reach over their heads. This can include activities such as: Tennis. Baseball or softball. Swimming. Construction work. Painting. What are the signs or symptoms? Symptoms of this condition include: Pain that spreads (radiates) from the shoulder to the upper arm. Swelling and tenderness in front of the shoulder. Pain when reaching, pulling, or lifting the arm above the head. Pain when lowering the arm from above the head. Minor pain in the shoulder when resting. Increased pain in the shoulder at night. Difficulty placing the arm behind the back. How is this diagnosed? This condition is diagnosed with a physical exam and medical history. Tests may also be done, including: X-rays. MRI. Ultrasound. CT with or without contrast. How is this treated? Treatment for this condition depends on the severity of the condition. In less severe cases, treatment may include: Rest. This may be done with a sling that holds the shoulder still (immobilization). Your health care provider may also recommend avoiding activities that involve lifting your arm over your head. Icing the shoulder. Anti-inflammatory medicines, such as aspirin or ibuprofen. In more severe cases, treatment may include: Physical therapy. Steroid  injections. Surgery. Follow these instructions at home: If you have a sling: Wear the sling as told by your health care provider. Remove it only as told by your health care provider. Loosen it if your fingers tingle, become numb, or turn cold and blue. Keep it clean. If the sling is not waterproof: Do not let it get wet. Cover it with a watertight covering when you take a bath or shower. Managing pain, stiffness, and swelling  If directed, put ice on the injured area. To do this: If you have a removable sling, remove it as told by your health care provider. Put ice in a plastic bag. Place a towel between your skin and the bag. Leave the ice on for 20 minutes, 2-3 times a day. Move your fingers often to reduce stiffness and swelling. Raise (elevate) the injured area above the level of your heart while you are lying down. Find a comfortable sleeping position, or sleep in a recliner, if available. Activity Rest your shoulder as told by your health care provider. Ask your health care provider when it is safe to drive if you have a sling on your arm. Return to your normal activities as told by your health care provider. Ask your health care provider what activities are safe for you. Do any exercises or stretches as told by your health care provider or physical therapist. If you do repetitive overhead tasks, take small breaks in between and include stretching exercises as told by your health care provider. General instructions Do not use any products that contain nicotine or tobacco, such as cigarettes, e-cigarettes, and chewing tobacco. These can delay healing. If you need help quitting, ask your health care provider. Take  over-the-counter and prescription medicines only as told by your health care provider. Keep all follow-up visits as told by your health care provider. This is important. Contact a health care provider if: Your pain gets worse. You have new pain in your arm, hands, or  fingers. Your pain is not relieved with medicine or does not get better after 6 weeks of treatment. You have crackling sensations when moving your shoulder in certain directions. You hear a snapping sound after using your shoulder, followed by severe pain and weakness. Get help right away if: Your arm, hand, or fingers are numb or tingling. Your arm, hand, or fingers are swollen or painful or they turn white or blue. Summary Rotator cuff tendinitis is inflammation of the tendons in the rotator cuff. Tendons are tough, cord-like bands that connect muscle to bone. This condition is usually caused by overusing the rotator cuff, which includes all of the muscles and tendons that connect the arm to the shoulder. This condition is more likely to develop in athletes and workers who frequently use their shoulder or reach over their heads. Treatment generally includes rest, anti-inflammatory medicines, and icing. In some cases, physical therapy and steroid injections may be needed. In severe cases, surgery may be needed. This information is not intended to replace advice given to you by your health care provider. Make sure you discuss any questions you have with your health care provider. Document Revised: 11/29/2018 Document Reviewed: 11/29/2018 Elsevier Patient Education  [place].

## 2021-01-01 ENCOUNTER — Ambulatory Visit
Admission: RE | Admit: 2021-01-01 | Discharge: 2021-01-01 | Disposition: A | Discharge status: Home / Self Care | Payer: Managed Care, Other (non HMO) | Source: Ambulatory Visit | Attending: Internal Medicine | Admitting: Internal Medicine

## 2021-01-01 DIAGNOSIS — Z171 Estrogen receptor negative status [ER-]: Secondary | ICD-10-CM | POA: Diagnosis present

## 2021-01-01 DIAGNOSIS — C50811 Malignant neoplasm of overlapping sites of right female breast: Secondary | ICD-10-CM

## 2021-01-02 ENCOUNTER — Telehealth: Payer: Self-pay | Admitting: Internal Medicine

## 2021-01-02 NOTE — Telephone Encounter (Signed)
J/C-please inform patient that mammogram most likely cyst.  Continue mammogram as recommended. GB  FYI-Dr.Bymett.

## 2021-01-03 ENCOUNTER — Telehealth: Payer: Self-pay | Admitting: *Deleted

## 2021-01-03 ENCOUNTER — Other Ambulatory Visit: Payer: Self-pay | Admitting: *Deleted

## 2021-01-03 DIAGNOSIS — C50811 Malignant neoplasm of overlapping sites of right female breast: Secondary | ICD-10-CM

## 2021-01-03 DIAGNOSIS — Z853 Personal history of malignant neoplasm of breast: Secondary | ICD-10-CM

## 2021-01-03 NOTE — Telephone Encounter (Signed)
RN called and spoke with pt regarding mychart message about mammogram results.  RN instructed pt that Dr Rogue Bussing stated that if was likely a cyst as documentation in final result mammogram report and recommended follow up per report.  Reports states recommended diagnostic bilateral mammogram right breast ultrasound in 6 months. RN instructed scheduler would call with appointment.  Pt verbalized understanding.

## 2021-01-14 ENCOUNTER — Telehealth: Payer: Self-pay

## 2021-01-14 NOTE — Telephone Encounter (Signed)
Vaccine updated in patient's chart.

## 2021-01-16 ENCOUNTER — Encounter: Payer: Self-pay | Admitting: Nurse Practitioner

## 2021-01-21 ENCOUNTER — Ambulatory Visit (INDEPENDENT_AMBULATORY_CARE_PROVIDER_SITE_OTHER): Payer: Managed Care, Other (non HMO) | Admitting: Nurse Practitioner

## 2021-01-21 ENCOUNTER — Other Ambulatory Visit: Payer: Self-pay

## 2021-01-21 ENCOUNTER — Encounter: Payer: Self-pay | Admitting: Nurse Practitioner

## 2021-01-21 VITALS — BP 126/87 | HR 94 | Temp 97.7°F | Wt 215.2 lb

## 2021-01-21 DIAGNOSIS — Z0289 Encounter for other administrative examinations: Secondary | ICD-10-CM | POA: Diagnosis not present

## 2021-01-21 DIAGNOSIS — Z0184 Encounter for antibody response examination: Secondary | ICD-10-CM

## 2021-01-21 DIAGNOSIS — Z6835 Body mass index (BMI) 35.0-35.9, adult: Secondary | ICD-10-CM

## 2021-01-21 NOTE — Progress Notes (Signed)
BP 126/87   Pulse 94   Temp 97.7 F (36.5 C) (Oral)   Wt 215 lb 3.2 oz (97.6 kg)   LMP  (LMP Unknown)   SpO2 98%   BMI 35.81 kg/m    Subjective:    Patient ID: Adrienne [name], female    DOB: [birth year], 60 y.o.   MRN: [id number]  HPI: Adrienne [name] is a 60 y.o. female  Chief Complaint  Patient presents with   Immunizations    Patient is here to discuss vaccinations for class and has paperwork that has to be completed by provider. Patient states she is here for a vaccination regarding the measles, mumps and rubella.    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 Brought in form which needs to be signed for this and once complete the results need to be attached and she will pick-up from office.  No other needs noted.  Relevant past medical, surgical, family and social history reviewed and updated as indicated. Interim medical history since our last visit reviewed. Allergies and medications reviewed and updated.  Review of Systems  Constitutional:  Negative for activity change, appetite change, diaphoresis, fatigue and fever.  Respiratory:  Negative for cough, chest tightness and shortness of breath.   Cardiovascular:  Negative for chest pain, palpitations and leg swelling.  Gastrointestinal: Negative.   Endocrine: Negative.   Neurological: Negative.   Psychiatric/Behavioral: Negative.     Per HPI unless specifically indicated above     Objective:    BP 126/87   Pulse 94   Temp 97.7 F (36.5 C) (Oral)   Wt 215 lb 3.2 oz (97.6 kg)   LMP  (LMP Unknown)   SpO2 98%   BMI 35.81 kg/m   Wt Readings from Last 3 Encounters:  01/21/21 215 lb 3.2 oz (97.6 kg)  12/31/20 216 lb 6.4 oz (98.2 kg)  12/14/20 221 lb 6.4 oz (100.4 kg)    Physical Exam Vitals and nursing note reviewed.  Constitutional:      General: She is awake. She is not in acute distress.    Appearance: She is well-developed  and well-groomed. She is obese. She is not ill-appearing.  HENT:     Head: Normocephalic.     Right Ear: Hearing normal.     Left Ear: Hearing normal.  Eyes:     General: Lids are normal.        Right eye: No discharge.        Left eye: No discharge.     Conjunctiva/sclera: Conjunctivae normal.     Pupils: Pupils are equal, round, and reactive to light.  Neck:     Thyroid: No thyromegaly.     Vascular: No carotid bruit.  Cardiovascular:     Rate and Rhythm: Normal rate and regular rhythm.     Heart sounds: Normal heart sounds. No murmur heard.   No gallop.  Pulmonary:     Effort: Pulmonary effort is normal. No accessory muscle usage or respiratory distress.     Breath sounds: Normal breath sounds.  Abdominal:     General: Bowel sounds are normal.     Palpations: Abdomen is soft. There is no hepatomegaly or splenomegaly.  Musculoskeletal:     Cervical back: Normal range of motion and neck supple.     Right lower leg: No edema.     Left lower leg: No edema.  Skin:    General:  Skin is warm and dry.  Neurological:     Mental Status: She is alert and oriented to person, place, and time.  Psychiatric:        Attention and Perception: Attention normal.        Mood and Affect: Mood normal.        Speech: Speech normal.        Behavior: Behavior normal. Behavior is cooperative.        Thought Content: Thought content normal.    Results for orders placed or performed in visit on 10/01/20  Cancer antigen 27.29  Result Value Ref Range   CA 27.29 19.6 0.0 - 38.6 U/mL  Kappa/lambda light chains  Result Value Ref Range   Kappa free light chain 31.3 (H) 3.3 - 19.4 mg/L   Lambda free light chains 13.3 5.7 - 26.3 mg/L   Kappa, lambda light chain ratio 2.35 (H) 0.26 - 1.65  Multiple Myeloma Panel (SPEP&IFE w/QIG)  Result Value Ref Range   IgG (Immunoglobin G), Serum 2,021 (H) 586 - 1,602 mg/dL   IgA 152 87 - 352 mg/dL   IgM (Immunoglobulin M), Srm 48 26 - 217 mg/dL   Total Protein  ELP 7.8 6.0 - 8.5 g/dL   Albumin SerPl Elph-Mcnc 4.0 2.9 - 4.4 g/dL   Alpha 1 0.2 0.0 - 0.4 g/dL   Alpha2 Glob SerPl Elph-Mcnc 0.9 0.4 - 1.0 g/dL   B-Globulin SerPl Elph-Mcnc 1.1 0.7 - 1.3 g/dL   Gamma Glob SerPl Elph-Mcnc 1.6 0.4 - 1.8 g/dL   M Protein SerPl Elph-Mcnc 1.1 (H) Not Observed g/dL   Globulin, Total 3.8 2.2 - 3.9 g/dL   Albumin/Glob SerPl 1.1 0.7 - 1.7   IFE 1 Comment (A)    Please Note Comment       Assessment & Plan:   Problem List Items Addressed This Visit       Other   Class 2 severe obesity due to excess calories with serious comorbidity and body mass index (BMI) of 35.0 to 35.9 in adult ([place]) - Primary    BMI 35.81.  Recommended eating smaller high protein, low fat meals more frequently and exercising 30 mins a day 5 times a week with a goal of 10-15lb weight loss in the next 3 months. Patient voiced their understanding and motivation to adhere to these recommendations.       Other Visit Diagnoses     Immunity status testing       MMR titer today to check on immunity status for school forms.   Relevant Orders   Measles/Mumps/Rubella Immunity   Encounter for completion of form with patient       School forms to be completed.        Follow up plan: Return for as scheduled.

## 2021-01-21 NOTE — Patient Instructions (Signed)

## 2021-01-21 NOTE — Assessment & Plan Note (Signed)
BMI 35.81.  Recommended eating smaller high protein, low fat meals more frequently and exercising 30 mins a day 5 times a week with a goal of 10-15lb weight loss in the next 3 months. Patient voiced their understanding and motivation to adhere to these recommendations.

## 2021-01-22 ENCOUNTER — Telehealth: Payer: Self-pay | Admitting: Nurse Practitioner

## 2021-01-22 LAB — MEASLES/MUMPS/RUBELLA IMMUNITY
MUMPS ABS, IGG: 43.2 AU/mL (ref >10.9)
RUBEOLA AB, IGG: 49.8 AU/mL (ref >16.4)
Rubella Antibodies, IGG: <0.9 index — ABNORMAL LOW (ref >0.99)

## 2021-01-22 NOTE — Progress Notes (Signed)
Refer to telephone note dated 01/22/21.

## 2021-01-22 NOTE — Telephone Encounter (Signed)
Spoke to patient on telephone to notify her of MMR titer.  She is showing immunity to everything but rubella, made her aware she needs on MMR vaccine and will have staff call her to schedule nurse visit only for this.  She reported understanding.

## 2021-01-22 NOTE — Telephone Encounter (Signed)
Called patient and appointment has been scheduled.Pt is also inquiring about paperwork that she gave jolene to fill out.

## 2021-01-22 NOTE — Telephone Encounter (Signed)
Spoke with patient and notified her of that tomorrow when she comes in for her scheduled nurse visit for MMR vaccine, that she will provide her with the completed paperwork then. Patient verbalized understanding and has no further questions at this time.

## 2021-01-22 NOTE — Addendum Note (Signed)
Addended by: Marnee Guarneri T on: 01/22/2021 01:10 PM   Modules accepted: Orders

## 2021-01-23 ENCOUNTER — Other Ambulatory Visit: Payer: Self-pay

## 2021-01-23 ENCOUNTER — Ambulatory Visit (INDEPENDENT_AMBULATORY_CARE_PROVIDER_SITE_OTHER): Payer: Managed Care, Other (non HMO)

## 2021-01-23 DIAGNOSIS — Z23 Encounter for immunization: Secondary | ICD-10-CM

## 2021-03-26 ENCOUNTER — Other Ambulatory Visit: Payer: Self-pay | Admitting: *Deleted

## 2021-03-26 DIAGNOSIS — C50811 Malignant neoplasm of overlapping sites of right female breast: Secondary | ICD-10-CM

## 2021-03-27 ENCOUNTER — Other Ambulatory Visit: Payer: Self-pay

## 2021-03-27 MED ORDER — ATORVASTATIN CALCIUM 10 MG PO TABS: 10.0000 mg | ORAL_TABLET | Freq: Every day | ORAL | 4 refills | Status: DC

## 2021-03-30 DIAGNOSIS — D649 Anemia, unspecified: Secondary | ICD-10-CM | POA: Insufficient documentation

## 2021-04-01 ENCOUNTER — Encounter: Payer: Self-pay | Admitting: Internal Medicine

## 2021-04-01 ENCOUNTER — Inpatient Hospital Stay: Payer: Managed Care, Other (non HMO) | Admitting: Internal Medicine

## 2021-04-01 ENCOUNTER — Other Ambulatory Visit: Payer: Self-pay

## 2021-04-01 ENCOUNTER — Ambulatory Visit: Payer: Managed Care, Other (non HMO) | Admitting: Nurse Practitioner

## 2021-04-01 ENCOUNTER — Inpatient Hospital Stay: Payer: Managed Care, Other (non HMO) | Attending: Internal Medicine

## 2021-04-01 ENCOUNTER — Encounter: Payer: Self-pay | Admitting: Nurse Practitioner

## 2021-04-01 VITALS — BP 135/69 | HR 87 | Temp 97.9°F | Wt 217.2 lb

## 2021-04-01 VITALS — BP 132/86 | HR 86 | Temp 98.0°F | Ht 65.0 in | Wt 217.4 lb

## 2021-04-01 DIAGNOSIS — G620 Drug-induced polyneuropathy: Secondary | ICD-10-CM

## 2021-04-01 DIAGNOSIS — D472 Monoclonal gammopathy: Secondary | ICD-10-CM | POA: Insufficient documentation

## 2021-04-01 DIAGNOSIS — M8580 Other specified disorders of bone density and structure, unspecified site: Secondary | ICD-10-CM | POA: Insufficient documentation

## 2021-04-01 DIAGNOSIS — Z853 Personal history of malignant neoplasm of breast: Secondary | ICD-10-CM | POA: Diagnosis present

## 2021-04-01 DIAGNOSIS — I10 Essential (primary) hypertension: Secondary | ICD-10-CM

## 2021-04-01 DIAGNOSIS — C50811 Malignant neoplasm of overlapping sites of right female breast: Secondary | ICD-10-CM

## 2021-04-01 DIAGNOSIS — E669 Obesity, unspecified: Secondary | ICD-10-CM | POA: Insufficient documentation

## 2021-04-01 DIAGNOSIS — D649 Anemia, unspecified: Secondary | ICD-10-CM

## 2021-04-01 DIAGNOSIS — M85851 Other specified disorders of bone density and structure, right thigh: Secondary | ICD-10-CM

## 2021-04-01 DIAGNOSIS — R7303 Prediabetes: Secondary | ICD-10-CM

## 2021-04-01 DIAGNOSIS — E782 Mixed hyperlipidemia: Secondary | ICD-10-CM

## 2021-04-01 DIAGNOSIS — T451X5A Adverse effect of antineoplastic and immunosuppressive drugs, initial encounter: Secondary | ICD-10-CM

## 2021-04-01 DIAGNOSIS — J4520 Mild intermittent asthma, uncomplicated: Secondary | ICD-10-CM

## 2021-04-01 DIAGNOSIS — Z171 Estrogen receptor negative status [ER-]: Secondary | ICD-10-CM | POA: Diagnosis not present

## 2021-04-01 DIAGNOSIS — G629 Polyneuropathy, unspecified: Secondary | ICD-10-CM | POA: Insufficient documentation

## 2021-04-01 DIAGNOSIS — E559 Vitamin D deficiency, unspecified: Secondary | ICD-10-CM

## 2021-04-01 DIAGNOSIS — Z8616 Personal history of COVID-19: Secondary | ICD-10-CM | POA: Insufficient documentation

## 2021-04-01 DIAGNOSIS — Z6835 Body mass index (BMI) 35.0-35.9, adult: Secondary | ICD-10-CM

## 2021-04-01 LAB — CBC WITH DIFFERENTIAL/PLATELET
Abs Immature Granulocytes: 0.02 10*3/uL (ref 0.00–0.07)
Basophils Absolute: 0 10*3/uL (ref 0.0–0.1)
Basophils Relative: 0 %
Eosinophils Absolute: 0.2 10*3/uL (ref 0.0–0.5)
Eosinophils Relative: 3 %
HCT: 31.4 % — ABNORMAL LOW (ref 36.0–46.0)
Hemoglobin: 10.5 g/dL — ABNORMAL LOW (ref 12.0–15.0)
Immature Granulocytes: 0 %
Lymphocytes Relative: 34 %
Lymphs Abs: 2.3 10*3/uL (ref 0.7–4.0)
MCH: 32.2 pg (ref 26.0–34.0)
MCHC: 33.4 g/dL (ref 30.0–36.0)
MCV: 96.3 fL (ref 80.0–100.0)
Monocytes Absolute: 0.5 10*3/uL (ref 0.1–1.0)
Monocytes Relative: 8 %
Neutro Abs: 3.7 10*3/uL (ref 1.7–7.7)
Neutrophils Relative %: 55 %
Platelets: 232 10*3/uL (ref 150–400)
RBC: 3.26 MIL/uL — ABNORMAL LOW (ref 3.87–5.11)
RDW: 14.2 % (ref 11.5–15.5)
WBC: 6.7 10*3/uL (ref 4.0–10.5)
nRBC: 0 % (ref 0.0–0.2)

## 2021-04-01 LAB — COMPREHENSIVE METABOLIC PANEL
ALT: 14 U/L (ref 0–44)
AST: 16 U/L (ref 15–41)
Albumin: 3.8 g/dL (ref 3.5–5.0)
Alkaline Phosphatase: 63 U/L (ref 38–126)
Anion gap: 8 (ref 5–15)
BUN: 16 mg/dL (ref 8–23)
CO2: 26 mmol/L (ref 22–32)
Calcium: 9.2 mg/dL (ref 8.9–10.3)
Chloride: 104 mmol/L (ref 98–111)
Creatinine, Ser: 0.78 mg/dL (ref 0.44–1.00)
GFR, Estimated: >60 mL/min (ref >60)
Glucose, Bld: 96 mg/dL (ref 70–99)
Potassium: 3.6 mmol/L (ref 3.5–5.1)
Sodium: 138 mmol/L (ref 135–145)
Total Bilirubin: 0.4 mg/dL (ref 0.3–1.2)
Total Protein: 7.7 g/dL (ref 6.5–8.1)

## 2021-04-01 LAB — MICROALBUMIN, URINE WAIVED
Creatinine, Urine Waived: 300 mg/dL (ref 10–300)
Microalb, Ur Waived: 30 mg/L — ABNORMAL HIGH (ref 0–19)
Microalb/Creat Ratio: <30 mg/g (ref <30)

## 2021-04-01 LAB — BAYER DCA HB A1C WAIVED: HB A1C (BAYER DCA - WAIVED): 5.9 % — ABNORMAL HIGH (ref 4.8–5.6)

## 2021-04-01 MED ORDER — LISINOPRIL 10 MG PO TABS: 10.0000 mg | ORAL_TABLET | Freq: Every day | ORAL | 4 refills | Status: DC

## 2021-04-01 NOTE — Assessment & Plan Note (Signed)
Continue collaboration with oncology, recent notes reviewed.  Patient denies any pain today. 

## 2021-04-01 NOTE — Assessment & Plan Note (Signed)
Chronic, ongoing.  Continue current medication regimen and adjust as needed.  Lipid panel today, recent labs showed lower levels with statin on board.

## 2021-04-01 NOTE — Assessment & Plan Note (Signed)
Stable at this time, no significant lingering symptoms.  Monitor.

## 2021-04-01 NOTE — Assessment & Plan Note (Addendum)
#   MGUS-; STABLE; July 2022-M protein- 1.1.  Otherwise hemoglobin stable at 10-11. STABLE.  No evidence of renal function or hypercalcemia. 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  Clinically no evidence of recurrence; OCT 2022 normal-stable complicated cyst.  Awaiting repeat mammogram in April 2023-pending/Dr. Byrnett.  Continue surveillance.   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  Hb 10-11-;STABLE  #Osteopenia : Reviewed the bone density  [April 2022-]T-score of -1.3; continue VitD. [hx of falls]; recommend resistance training [pt currently on @ home/videos]  # Obesity- recommend continued weight loss efforts  # DISPOSITION:  # diagnostic mammo/US in October 2022.  # follow up in 6 months-MD /labs- cbc/cmp/ca-27-29/MM panel;K-L panel-Dr.B

## 2021-04-01 NOTE — Assessment & Plan Note (Signed)
Chronic, stable with recent levels at goal.  Continue supplement and check level today. 

## 2021-04-01 NOTE — Assessment & Plan Note (Signed)
BMI 36.18.  Recommended eating smaller high protein, low fat meals more frequently and exercising 30 mins a day 5 times a week with a goal of 10-15lb weight loss in the next 3 months. Patient voiced their understanding and motivation to adhere to these recommendations.

## 2021-04-01 NOTE — Progress Notes (Signed)
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   # 2012 LEFT BREAST CA STAGE I s/p Lumpec [T1 (0.7CM);Triple Neg; G-3]; s/p mammosite RT; No adj chemo  #  Mild Anemia/M-protein 0.8gm/dl [sep 2016]  # BRCA- 1&2 NEGATIVE [july 2016]; s/p Genetic counseling [June 2020]  # PN s/p acupuncture  --------------------------------------------------     DIAGNOSIS: breast cancer  STAGE:   I      ;GOALS: cure  CURRENT/MOST RECENT THERAPY: surveillaince     History of breast cancer  08/31/2018 Genetic Testing   Two VUS identified on the CustomNext-Cancer+RNAinsight.  CFTR p.R450I and CTNNA1 p.L402S VUS.  The CustomNext-Expanded gene panel offered by [facility] and includes sequencing and rearrangement analysis for the following 81 genes: AIP, ALK, APC*, ATM*, AXIN2, BAP1, BARD1, BLM, BMPR1A, BRCA1*, BRCA2*, BRIP1*, CDC73, CDH1*, CDK4, CDKN1B, CDKN2A, CHEK2*, CTNNA1, DICER1, FANCC, FH, FLCN, GALNT12, HOXB13, KIT, MAX, MEN1, MET, MLH1*, MRE11A, MSH2*, MSH6*, MUTYH*, NBN, NF1*, NF2, NTHL1, PALB2*, PDGFRA, PHOX2B, PMS2*, POLD1, POLE, POT1, PRKAR1A, PTCH1, PTEN*, RAD50, RAD51C*, RAD51D*, RB1, RET, SDHA, SDHAF2, SDHB, SDHC, SDHD, SMAD4, SMARCA4, SMARCB1, SMARCE1, STK11, SUFU, TMEM127, TP53*, TSC1, TSC2, VHL and XRCC2 (sequencing and deletion/duplication); CASR, CFTR, CPA1, CTRC, EGFR, MITF, PRSS1 and SPINK1 (sequencing only); EPCAM and GREM1 (deletion/duplication only). DNA and RNA analyses performed for * genes. The report date is September 01, 2018.     INTERVAL HISTORY:  A pleasant 61 year old female patient with above history of stage I right-sided triple negative breast cancer; MGUS- Is here for follow-up/review results of the mammogram.  Appetite is good.  No weight loss.  Denies any new lumps or bumps.  Worsening tingling or numbness.  No worsening back pain.  Review of Systems  Constitutional:  Negative for chills, diaphoresis, fever, malaise/fatigue and weight loss.  HENT:  Negative for nosebleeds and sore throat.   Eyes:  Negative for double vision.  Respiratory:  Negative for cough, hemoptysis, sputum production, shortness of breath and wheezing.   Cardiovascular:  Negative for chest pain, palpitations, orthopnea and leg swelling.  Gastrointestinal:  Negative for abdominal pain, blood in stool, constipation, diarrhea, heartburn, melena, nausea and vomiting.  Genitourinary:  Negative for dysuria, frequency and urgency.  Musculoskeletal:  Positive for joint pain. Negative for back pain.  Skin: Negative.  Negative for itching and rash.  Neurological:  Positive for tingling. Negative for dizziness, focal weakness, weakness and headaches.  Endo/Heme/Allergies:  Does not bruise/bleed easily.  Psychiatric/Behavioral:  Negative for depression. The patient is not nervous/anxious and does not have insomnia.     PAST MEDICAL HISTORY :  Past Medical History:  Diagnosis Date   Anemia    Asthma    BRCA negative 09/22/2014   Breast cancer ([place]) 2012   LT LUMPECTOMY   Breast cancer ([place]) 2016   RT LUMPECTOMY   Breast screening, unspecified    Constipation    Dry eyes 2012   Eczema    Family history of breast cancer    Family history of ovarian cancer    Family history of prostate cancer  GERD (gastroesophageal reflux disease)    Hematuria    Kidney stone    Lump or mass in breast    Malignant neoplasm of breast (female), unspecified site 07/08/2014   Right breast, 5 mm, T1a,N0, triple negative; high risk for  recurrence on Mammoprint.   Malignant neoplasm of upper-outer quadrant of female breast ([place]) 05/08/2010   Left breast: T1b, N0, M); triple negative, DCIS present. No chemotherapy. MammoSite radiation.   Neuropathy    pt states in fingertips and feet   Obesity, unspecified    Pancreatic lesion    Personal history of chemotherapy 2016   BREAST CA- Right   Personal history of malignant neoplasm of breast 2012   has been treated with left breast wide excision and radiation therapy; Patient has DCIS as well as a 7 mm infiltrating mammary carcinoma triple negative removed on 05-08-10   Personal history of radiation therapy 2012   BREAST CA - MAMMOSITE- Left   Personal history of radiation therapy 2016   BREAST CA - MAMMOSITE- Right   Screening for obesity    Seasonal allergies    Special screening for malignant neoplasms, colon    Unspecified essential hypertension     PAST SURGICAL HISTORY :   Past Surgical History:  Procedure Laterality Date   BREAST BIOPSY Right 2016   Invasive   BREAST CYST ASPIRATION Left 2010   BREAST LUMPECTOMY Left 2012   BREAST CA   BREAST LUMPECTOMY Right 2016   INVASIVE MAMMARY CARCINOMA   BREAST LUMPECTOMY WITH AXILLARY LYMPH NODE BIOPSY Right 08/01/2014   Procedure: BREAST LUMPECTOMY WITH AXILLARY LYMPH NODE BIOPSY;  Surgeon: Robert Bellow, MD;  Location: [facility] ORS;  Service: General;  Laterality: Right;   BREAST MAMMOSITE  April 2012   mammosite placement and removal    BREAST SURGERY Left 2012   left breast wide excision, sentinel node, MammoSite   BREAST SURGERY Right 08/06/2014   Wide excision/sentinel node biopsy, T1a, N0 triple negative, MammoSite   CESAREAN SECTION  1988   COLONOSCOPY  October 17, 2009   Dr. Dionne Milo, normal study.   KIDNEY STONE SURGERY  2003   stones removed by Dr. Quillian Quince- stent placed/removed   PORTACATH PLACEMENT Left 11/09/2014   Procedure: INSERTION PORT-A-CATH;  Surgeon: Robert Bellow, MD;  Location: [facility] ORS;   Service: General;  Laterality: Left;   SENTINEL NODE BIOPSY Right 08/01/2014   Procedure: SENTINEL NODE BIOPSY;  Surgeon: Robert Bellow, MD;  Location: [facility] ORS;  Service: General;  Laterality: Right;   TUBAL LIGATION      FAMILY HISTORY :   Family History  Problem Relation Age of Onset   Ovarian cancer Mother    Diabetes Mother    Diabetes Father    Prostate cancer Father        dx in his 78s   Hypertension Brother    Cancer Maternal Grandmother        bone   Cancer Maternal Grandfather        lung   Cancer Paternal Grandmother        lung   Asthma Paternal Grandfather    Hypertension Sister    Prostate cancer Brother 13   AAA (abdominal aortic aneurysm) Maternal Uncle    Breast cancer Other     SOCIAL HISTORY:   Social History   Tobacco Use   Smoking status: Never   Smokeless tobacco: Never  Vaping Use   Vaping Use: Never used  Substance Use Topics  Alcohol use: Yes    Comment: occasionally   Drug use: No    ALLERGIES:  is allergic to cayenne pepper [cayenne], shellfish allergy, and tape.  MEDICATIONS:  Current Outpatient Medications  Medication Sig Dispense Refill   albuterol (VENTOLIN HFA) 108 (90 Base) MCG/ACT inhaler USE 2 INHALATIONS BY MOUTH  EVERY 6 HOURS AS NEEDED FOR WHEEZING OR SHORTNESS OF  BREATH 26.8 g 3   aspirin 81 MG tablet Take 81 mg by mouth daily.     atorvastatin (LIPITOR) 10 MG tablet Take 1 tablet (10 mg total) by mouth daily. 90 tablet 4   bisacodyl (DULCOLAX) 5 MG EC tablet Take 5 mg by mouth daily as needed for moderate constipation.     Cholecalciferol (VITAMIN D3) 1000 UNITS CAPS Take 1 capsule by mouth daily.      clobetasol (TEMOVATE) 0.05 % external solution Apply 1 application topically daily. Mix bottle in 1 tub of cerave cream and use qd to trunk, arms and legs, avoid face, groin, axilla 50 mL 1   DUPIXENT 300 MG/2ML SOPN INJECT 300MG SUBCUTANEOUSLY EVERY OTHER WEEK 4 mL 6   esomeprazole (NEXIUM) 40 MG capsule Take 40 mg by  mouth 2 (two) times daily as needed (acid reflux).      lisinopril (ZESTRIL) 10 MG tablet Take 1 tablet (10 mg total) by mouth daily. 90 tablet 4   loratadine (CLARITIN) 10 MG tablet Take 10 mg by mouth daily. Reported on 04/13/2015     polyethylene glycol (MIRALAX / GLYCOLAX) packet Take 17 g by mouth every other day.     Turmeric 500 MG CAPS Take 500 mg by mouth daily.     No current facility-administered medications for this visit.   Facility-Administered Medications Ordered in Other Visits  Medication Dose Route Frequency Provider Last Rate Last Admin   sodium chloride 0.9 % injection 10 mL  10 mL Intravenous PRN Leia Alf, MD   10 mL at 11/10/14 1138    PHYSICAL EXAMINATION: ECOG PERFORMANCE STATUS: 1 - Symptomatic but completely ambulatory  BP 135/69    Pulse 87    Temp 97.9 F (36.6 C) (Tympanic)    Wt 217 lb 3.2 oz (98.5 kg)    LMP  (LMP Unknown)    BMI 36.14 kg/m   Filed Weights   04/01/21 1425  Weight: 217 lb 3.2 oz (98.5 kg)    Physical Exam HENT:     Head: Normocephalic and atraumatic.     Mouth/Throat:     Pharynx: No oropharyngeal exudate.  Eyes:     Pupils: Pupils are equal, round, and reactive to light.  Cardiovascular:     Rate and Rhythm: Normal rate and regular rhythm.  Pulmonary:     Effort: No respiratory distress.     Breath sounds: No wheezing.  Abdominal:     General: Bowel sounds are normal. There is no distension.     Palpations: Abdomen is soft. There is no mass.     Tenderness: There is no abdominal tenderness. There is no guarding or rebound.  Musculoskeletal:        General: No tenderness. Normal range of motion.     Cervical back: Normal range of motion and neck supple.  Skin:    General: Skin is warm.  Neurological:     Mental Status: She is alert and oriented to person, place, and time.  Psychiatric:        Mood and Affect: Affect normal.     LABORATORY DATA:  I have reviewed the data as listed    Component Value Date/Time    NA 138 04/01/2021 1411   NA 139 10/01/2020 0902   NA 140 07/16/2011 2130   K 3.6 04/01/2021 1411   K 3.7 07/16/2011 2130   CL 104 04/01/2021 1411   CL 106 07/16/2011 2130   CO2 26 04/01/2021 1411   CO2 27 07/16/2011 2130   GLUCOSE 96 04/01/2021 1411   GLUCOSE 93 07/16/2011 2130   BUN 16 04/01/2021 1411   BUN 27 10/01/2020 0902   BUN 16 07/16/2011 2130   CREATININE 0.78 04/01/2021 1411   CREATININE 0.95 07/16/2011 2130   CALCIUM 9.2 04/01/2021 1411   CALCIUM 9.0 07/16/2011 2130   PROT 7.7 04/01/2021 1411   PROT 7.1 10/01/2020 0902   ALBUMIN 3.8 04/01/2021 1411   ALBUMIN 4.2 10/01/2020 0902   AST 16 04/01/2021 1411   ALT 14 04/01/2021 1411   ALKPHOS 63 04/01/2021 1411   BILITOT 0.4 04/01/2021 1411   BILITOT 0.3 10/01/2020 0902   GFRNONAA >60 04/01/2021 1411   GFRNONAA >60 07/16/2011 2130   GFRAA 80 04/16/2020 1159   GFRAA >60 07/16/2011 2130    No results found for: SPEP, UPEP  Lab Results  Component Value Date   WBC 6.7 04/01/2021   NEUTROABS 3.7 04/01/2021   HGB 10.5 (L) 04/01/2021   HCT 31.4 (L) 04/01/2021   MCV 96.3 04/01/2021   PLT 232 04/01/2021      Chemistry      Component Value Date/Time   NA 138 04/01/2021 1411   NA 139 10/01/2020 0902   NA 140 07/16/2011 2130   K 3.6 04/01/2021 1411   K 3.7 07/16/2011 2130   CL 104 04/01/2021 1411   CL 106 07/16/2011 2130   CO2 26 04/01/2021 1411   CO2 27 07/16/2011 2130   BUN 16 04/01/2021 1411   BUN 27 10/01/2020 0902   BUN 16 07/16/2011 2130   CREATININE 0.78 04/01/2021 1411   CREATININE 0.95 07/16/2011 2130      Component Value Date/Time   CALCIUM 9.2 04/01/2021 1411   CALCIUM 9.0 07/16/2011 2130   ALKPHOS 63 04/01/2021 1411   AST 16 04/01/2021 1411   ALT 14 04/01/2021 1411   BILITOT 0.4 04/01/2021 1411   BILITOT 0.3 10/01/2020 0902         ASSESSMENT & PLAN:   MGUS (monoclonal gammopathy of unknown significance) # MGUS-; STABLE; July 2022-M protein- 1.1.  Otherwise hemoglobin stable at  10-11. STABLE.  No evidence of renal function or hypercalcemia. 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  Clinically no evidence of recurrence; OCT 2022 normal-stable complicated cyst.  Awaiting repeat mammogram in April 2023-pending/Dr. Byrnett.  Continue surveillance.   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  Hb 10-11-;STABLE  #Osteopenia : Reviewed the bone density  [April 2022-]T-score of -1.3; continue VitD. [hx of falls]; recommend resistance training [pt currently on @ home/videos]  # Obesity- recommend continued weight loss efforts  # DISPOSITION:  # diagnostic mammo/US in October 2022.  # follow up in 6 months-MD Reva Bores- cbc/cmp/ca-27-29/MM panel;K-L panel-Dr.B           Cammie Sickle, MD 04/01/2021 2:49 PM

## 2021-04-01 NOTE — Assessment & Plan Note (Signed)
Chronic, stable without medication at this time.  Has had side effects with Lyrica and Gabapentin, extreme fatigue.  Check B12 and Mag level annually.

## 2021-04-01 NOTE — Assessment & Plan Note (Signed)
Monitored by oncology as well -- CBC, iron, ferritin today.  Continue multivitamin at home.

## 2021-04-01 NOTE — Assessment & Plan Note (Signed)
Continue to collaborate with oncology and review notes. 

## 2021-04-01 NOTE — Assessment & Plan Note (Addendum)
Chronic, stable with BP near goal today and at goal on home BP readings.  Continue current medication regimen and adjust as needed.  Recommend she continue to monitor BP regularly at home and document + focus on DASH diet.  BMP and CBC today.  Return to office in 6 months.

## 2021-04-01 NOTE — Progress Notes (Signed)
BP 132/86    Pulse 86    Temp 98 F (36.7 C) (Oral)    Ht 5\' 5"  (1.651 m)    Wt 217 lb 6.4 oz (98.6 kg)    LMP  (LMP Unknown)    SpO2 98%    BMI 36.18 kg/m    Subjective:    Patient ID: Adrienne [name], female    DOB: 02/04/1961, 61 y.o.   MRN: [id number]  HPI: Adrienne [name] is a 61 y.o. female  Chief Complaint  Patient presents with   Hyperlipidemia   Hypertension   Asthma   Osteopenia   MGUS   Covid Exposure    Patient states she recently was Covid positive on the 9th of January. Patient states she has a little mucus coming up and states she had mild symptoms when she was feeling bad. Patient states she takes a prescription of Dupixent and she thinks it may have helped her and kept her lungs clear.    OSTEOPENIA DEXA 06/18/20 = the BMD measured at Femur Neck Right is 0.854 g/cm2 with a T-score of -1.3. Adequate calcium & vitamin D: yes Weight bearing exercises: yes    MGUS: Saw Dr. Tish Men last 10/01/20 -- returns to see them today.  Continues to follow with Dr. Bary Castilla annually due to breast cancer history, has been survivor for several years now -- left 2012 and right 2016.  They are monitoring her anemia, she reports history of kidney stones -- did see urology but did not need surgery.  Denies any symptoms or pain today.  HYPERTENSION Currently taking Lisinopril and ASA, Atorvastatin.  Last A1c July 2022 was 5.9%.  She has been making diet changes at home. Hypertension status: controlled  Satisfied with current treatment? yes Duration of hypertension: chronic BP monitoring frequency:  daily BP range: 120/70 average -- did not take medication this morning BP medication side effects:  no Medication compliance: good compliance Aspirin: yes Recurrent headaches: no Visual changes: no Palpitations: no Dyspnea: no Chest pain: no Lower extremity edema: trace on both sides, baseline -- compression hose at home Dizzy/lightheaded: no  The 10-year ASCVD risk score (Arnett  DK, et al., 2019) is: 6.2%   Values used to calculate the score:     Age: 21 years     Sex: Female     Is Non-Hispanic African American: Yes     Diabetic: No     Tobacco smoker: No     Systolic Blood Pressure: 779 mmHg     Is BP treated: Yes     HDL Cholesterol: 67 mg/dL     Total Cholesterol: 146 mg/dL  ASTHMA Current medications include Albuterol and Claritin as needed.  Was Covid positive January 9th after exposure to someone with Covid -- mild symptoms only, feels her Dupixent kept symptoms stable. No significant lingering symptoms at this time, mild congestion and phlegm. Asthma status: stable Satisfied with current treatment?: yes Albuterol/rescue inhaler frequency: maybe every few months Dyspnea frequency: none Wheezing frequency: none Cough frequency: none Nocturnal symptom frequency: none Limitation of activity: no Current upper respiratory symptoms: no Triggers: seasonal Home peak flows: none Last Spirometry: unknown Failed/intolerant to following asthma meds: none Asthma meds in past: none Aerochamber/spacer use: no Visits to ER or Urgent Care in past year: no Pneumovax: Up to Date Influenza: Up to Date   Relevant past medical, surgical, family and social history reviewed and updated as indicated. Interim medical history since our last visit reviewed. Allergies and medications  reviewed and updated.  Review of Systems  Constitutional:  Negative for activity change, appetite change, diaphoresis, fatigue and fever.  Respiratory:  Negative for cough, chest tightness and shortness of breath.   Cardiovascular:  Negative for chest pain, palpitations and leg swelling.  Gastrointestinal: Negative.   Endocrine: Negative.   Neurological: Negative.   Psychiatric/Behavioral: Negative.     Per HPI unless specifically indicated above     Objective:    BP 132/86    Pulse 86    Temp 98 F (36.7 C) (Oral)    Ht 5\' 5"  (1.651 m)    Wt 217 lb 6.4 oz (98.6 kg)    LMP  (LMP  Unknown)    SpO2 98%    BMI 36.18 kg/m   Wt Readings from Last 3 Encounters:  04/01/21 217 lb 6.4 oz (98.6 kg)  01/21/21 215 lb 3.2 oz (97.6 kg)  12/31/20 216 lb 6.4 oz (98.2 kg)    Physical Exam Vitals and nursing note reviewed.  Constitutional:      General: She is awake. She is not in acute distress.    Appearance: She is well-developed and well-groomed. She is obese. She is not ill-appearing.  HENT:     Head: Normocephalic.     Right Ear: Hearing normal.     Left Ear: Hearing normal.  Eyes:     General: Lids are normal.        Right eye: No discharge.        Left eye: No discharge.     Conjunctiva/sclera: Conjunctivae normal.     Pupils: Pupils are equal, round, and reactive to light.  Neck:     Thyroid: No thyromegaly.     Vascular: No carotid bruit.  Cardiovascular:     Rate and Rhythm: Normal rate and regular rhythm.     Heart sounds: Normal heart sounds. No murmur heard.   No gallop.     Comments: Compression socks in place. Pulmonary:     Effort: Pulmonary effort is normal. No accessory muscle usage or respiratory distress.     Breath sounds: Normal breath sounds.  Abdominal:     General: Bowel sounds are normal.     Palpations: Abdomen is soft. There is no hepatomegaly or splenomegaly.  Musculoskeletal:     Cervical back: Normal range of motion and neck supple.     Right lower leg: 1+ Edema present.     Left lower leg: 1+ Edema present.  Skin:    General: Skin is warm and dry.  Neurological:     Mental Status: She is alert and oriented to person, place, and time.  Psychiatric:        Attention and Perception: Attention normal.        Mood and Affect: Mood normal.        Speech: Speech normal.        Behavior: Behavior normal. Behavior is cooperative.        Thought Content: Thought content normal.   Results for orders placed or performed in visit on 01/21/21  Measles/Mumps/Rubella Immunity  Result Value Ref Range   Rubella Antibodies, IGG <0.90 (L)  Immune >0.99 index   RUBEOLA AB, IGG 49.8 Immune >16.4 AU/mL   MUMPS ABS, IGG 43.2 Immune >10.9 AU/mL      Assessment & Plan:   Problem List Items Addressed This Visit       Cardiovascular and Mediastinum   Hypertension    Chronic, stable with BP near goal today  and at goal on home BP readings.  Continue current medication regimen and adjust as needed.  Recommend she continue to monitor BP regularly at home and document + focus on DASH diet.  BMP and CBC today.  Return to office in 6 months.      Relevant Medications   lisinopril (ZESTRIL) 10 MG tablet   Other Relevant Orders   Basic metabolic panel   Microalbumin, Urine Waived     Respiratory   Asthma    Chronic, stable with minimal use of inhaler.  Continue current medication regimen and adjust as needed.  Spirometry next visit.        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  Has had side effects with Lyrica and Gabapentin, extreme fatigue.  Check B12 and Mag level annually.        Musculoskeletal and Integument   Osteopenia of neck of right femur    Noted on DEXA 06/18/20 -- at this time continue supplements daily and educated patient at length on finding.  Plan on repeat DEXA 06/18/2025.  Check Vitamin D today.      Relevant Orders   VITAMIN D 25 Hydroxy (Vit-D Deficiency, Fractures)     Other   Class 2 severe obesity due to excess calories with serious comorbidity and body mass index (BMI) of 35.0 to 35.9 in adult (HCC)    BMI 36.18.  Recommended eating smaller high protein, low fat meals more frequently and exercising 30 mins a day 5 times a week with a goal of 10-15lb weight loss in the next 3 months. Patient voiced their understanding and motivation to adhere to these recommendations.       History of 2019 novel coronavirus disease (COVID-19)    Stable at this time, no significant lingering symptoms.  Monitor.       History of breast cancer    Continue to collaborate with oncology and review notes.      Hyperlipemia, mixed    Chronic, ongoing.  Continue current medication regimen and adjust as needed.  Lipid panel today, recent labs showed lower levels with statin on board.         Relevant Medications   lisinopril (ZESTRIL) 10 MG tablet   Other Relevant Orders   Lipid Panel w/o Chol/HDL Ratio   Low hemoglobin    Monitored by oncology as well -- CBC, iron, ferritin today.  Continue multivitamin at home.      Relevant Orders   CBC with Differential/Platelet   Iron Binding Cap (TIBC)([facility])   Ferritin   MGUS (monoclonal gammopathy of unknown significance)    Continue collaboration with oncology, recent notes reviewed.  Patient denies any pain today.      Prediabetes    Noted on past labs, recheck A1C today and continue diet focus.  Initiate medications as needed.  A1c today 5.9% and urine ALB 30, stable.      Relevant Orders   Bayer DCA Hb A1c Waived   Microalbumin, Urine Waived   Vitamin D deficiency    Chronic, stable with recent levels at goal.  Continue supplement and check level today.      Relevant Orders   VITAMIN D 25 Hydroxy (Vit-D Deficiency, Fractures)     Follow up plan: Return in about 6 months (around 09/29/2021) for Annual physical.

## 2021-04-01 NOTE — Assessment & Plan Note (Signed)
Chronic, stable with minimal use of inhaler.  Continue current medication regimen and adjust as needed.  Spirometry next visit.

## 2021-04-01 NOTE — Patient Instructions (Signed)
Prediabetes Eating Plan °Prediabetes is a condition that causes blood sugar (glucose) levels to be higher than normal. This increases the risk for developing type 2 diabetes (type 2 diabetes mellitus). Working with a health care provider or nutrition specialist (dietitian) to make diet and lifestyle changes can help prevent the onset of diabetes. These changes may help you: °Control your blood glucose levels. °Improve your cholesterol levels. °Manage your blood pressure. °What are tips for following this plan? °Reading food labels °Read food labels to check the amount of fat, salt (sodium), and sugar in prepackaged foods. Avoid foods that have: °Saturated fats. °Trans fats. °Added sugars. °Avoid foods that have more than 300 milligrams (mg) of sodium per serving. Limit your sodium intake to less than 2,300 mg each day. °Shopping °Avoid buying pre-made and processed foods. °Avoid buying drinks with added sugar. °Cooking °Cook with olive oil. Do not use butter, lard, or ghee. °Bake, broil, grill, steam, or boil foods. Avoid frying. °Meal planning ° °Work with your dietitian to create an eating plan that is right for you. This may include tracking how many calories you take in each day. Use a food diary, notebook, or mobile application to track what you eat at each meal. °Consider following a Mediterranean diet. This includes: °Eating several servings of fresh fruits and vegetables each day. °Eating fish at least twice a week. °Eating one serving each day of whole grains, beans, nuts, and seeds. °Using olive oil instead of other fats. °Limiting alcohol. °Limiting red meat. °Using nonfat or low-fat dairy products. °Consider following a plant-based diet. This includes dietary choices that focus on eating mostly vegetables and fruit, grains, beans, nuts, and seeds. °If you have high blood pressure, you may need to limit your sodium intake or follow a diet such as the DASH (Dietary Approaches to Stop Hypertension) eating  plan. The DASH diet aims to lower high blood pressure. °Lifestyle °Set weight loss goals with help from your health care team. It is recommended that most people with prediabetes lose 7% of their body weight. °Exercise for at least 30 minutes 5 or more days a week. °Attend a support group or seek support from a mental health counselor. °Take over-the-counter and prescription medicines only as told by your health care provider. °What foods are recommended? °Fruits °Berries. Bananas. Apples. Oranges. Grapes. Papaya. Mango. Pomegranate. Kiwi. Grapefruit. Cherries. °Vegetables °Lettuce. Spinach. Peas. Beets. Cauliflower. Cabbage. Broccoli. Carrots. Tomatoes. Squash. Eggplant. Herbs. Peppers. Onions. Cucumbers. Brussels sprouts. °Grains °Whole grains, such as whole-wheat or whole-grain breads, crackers, cereals, and pasta. Unsweetened oatmeal. Bulgur. Barley. Quinoa. Brown rice. Corn or whole-wheat flour tortillas or taco shells. °Meats and other proteins °Seafood. Poultry without skin. Lean cuts of pork and beef. Tofu. Eggs. Nuts. Beans. °Dairy °Low-fat or fat-free dairy products, such as yogurt, cottage cheese, and cheese. °Beverages °Water. Tea. Coffee. Sugar-free or diet soda. Seltzer water. Low-fat or nonfat milk. Milk alternatives, such as soy or almond milk. °Fats and oils °Olive oil. Canola oil. Sunflower oil. Grapeseed oil. Avocado. Walnuts. °Sweets and desserts °Sugar-free or low-fat pudding. Sugar-free or low-fat ice cream and other frozen treats. °Seasonings and condiments °Herbs. Sodium-free spices. Mustard. Relish. Low-salt, low-sugar ketchup. Low-salt, low-sugar barbecue sauce. Low-fat or fat-free mayonnaise. °The items listed above may not be a complete list of recommended foods and beverages. Contact a dietitian for more information. °What foods are not recommended? °Fruits °Fruits canned with syrup. °Vegetables °Canned vegetables. Frozen vegetables with butter or cream sauce. °Grains °Refined white  flour and flour   products, such as bread, pasta, snack foods, and cereals. °Meats and other proteins °Fatty cuts of meat. Poultry with skin. Breaded or fried meat. Processed meats. °Dairy °Full-fat yogurt, cheese, or milk. °Beverages °Sweetened drinks, such as iced tea and soda. °Fats and oils °Butter. Lard. Ghee. °Sweets and desserts °Baked goods, such as cake, cupcakes, pastries, cookies, and cheesecake. °Seasonings and condiments °Spice mixes with added salt. Ketchup. Barbecue sauce. Mayonnaise. °The items listed above may not be a complete list of foods and beverages that are not recommended. Contact a dietitian for more information. °Where to find more information °American Diabetes Association: www.diabetes.org °Summary °You may need to make diet and lifestyle changes to help prevent the onset of diabetes. These changes can help you control blood sugar, improve cholesterol levels, and manage blood pressure. °Set weight loss goals with help from your health care team. It is recommended that most people with prediabetes lose 7% of their body weight. °Consider following a Mediterranean diet. This includes eating plenty of fresh fruits and vegetables, whole grains, beans, nuts, seeds, fish, and low-fat dairy, and using olive oil instead of other fats. °This information is not intended to replace advice given to you by your health care provider. Make sure you discuss any questions you have with your health care provider. °Document Revised: 05/26/2019 Document Reviewed: 05/26/2019 °Elsevier Patient Education © 2022 Elsevier Inc. ° °

## 2021-04-01 NOTE — Assessment & Plan Note (Signed)
Noted on past labs, recheck A1C today and continue diet focus.  Initiate medications as needed.  A1c today 5.9% and urine ALB 30, stable.

## 2021-04-01 NOTE — Assessment & Plan Note (Signed)
Noted on DEXA 06/18/20 -- at this time continue supplements daily and educated patient at length on finding.  Plan on repeat DEXA 06/18/2025.  Check Vitamin D today. 

## 2021-04-02 LAB — BASIC METABOLIC PANEL
BUN/Creatinine Ratio: 18 (ref 12–28)
BUN: 16 mg/dL (ref 8–27)
CO2: 21 mmol/L (ref 20–29)
Calcium: 9.3 mg/dL (ref 8.7–10.3)
Chloride: 108 mmol/L — ABNORMAL HIGH (ref 96–106)
Creatinine, Ser: 0.9 mg/dL (ref 0.57–1.00)
Glucose: 95 mg/dL (ref 70–99)
Potassium: 4.1 mmol/L (ref 3.5–5.2)
Sodium: 142 mmol/L (ref 134–144)
eGFR: 72 mL/min/{1.73_m2} (ref >59)

## 2021-04-02 LAB — CBC WITH DIFFERENTIAL/PLATELET
Basophils Absolute: 0 10*3/uL (ref 0.0–0.2)
Basos: 0 %
EOS (ABSOLUTE): 0.2 10*3/uL (ref 0.0–0.4)
Eos: 3 %
Hematocrit: 30.7 % — ABNORMAL LOW (ref 34.0–46.6)
Hemoglobin: 10.2 g/dL — ABNORMAL LOW (ref 11.1–15.9)
Immature Grans (Abs): 0 10*3/uL (ref 0.0–0.1)
Immature Granulocytes: 0 %
Lymphocytes Absolute: 2.1 10*3/uL (ref 0.7–3.1)
Lymphs: 36 %
MCH: 33 pg (ref 26.6–33.0)
MCHC: 33.2 g/dL (ref 31.5–35.7)
MCV: 99 fL — ABNORMAL HIGH (ref 79–97)
Monocytes Absolute: 0.5 10*3/uL (ref 0.1–0.9)
Monocytes: 9 %
Neutrophils Absolute: 3 10*3/uL (ref 1.4–7.0)
Neutrophils: 52 %
Platelets: 234 10*3/uL (ref 150–450)
RBC: 3.09 x10E6/uL — ABNORMAL LOW (ref 3.77–5.28)
RDW: 13.9 % (ref 11.7–15.4)
WBC: 5.8 10*3/uL (ref 3.4–10.8)

## 2021-04-02 LAB — KAPPA/LAMBDA LIGHT CHAINS
Kappa free light chain: 33.6 mg/L — ABNORMAL HIGH (ref 3.3–19.4)
Kappa, lambda light chain ratio: 2 — ABNORMAL HIGH (ref 0.26–1.65)
Lambda free light chains: 16.8 mg/L (ref 5.7–26.3)

## 2021-04-02 LAB — IRON AND TIBC
Iron Saturation: 30 % (ref 15–55)
Iron: 68 ug/dL (ref 27–139)
Total Iron Binding Capacity: 230 ug/dL — ABNORMAL LOW (ref 250–450)
UIBC: 162 ug/dL (ref 118–369)

## 2021-04-02 LAB — CANCER ANTIGEN 27.29: CA 27.29: 10.2 U/mL (ref 0.0–38.6)

## 2021-04-02 LAB — FERRITIN: Ferritin: 294 ng/mL — ABNORMAL HIGH (ref 15–150)

## 2021-04-02 LAB — LIPID PANEL W/O CHOL/HDL RATIO
Cholesterol, Total: 159 mg/dL (ref 100–199)
HDL: 58 mg/dL (ref >39)
LDL Chol Calc (NIH): 88 mg/dL (ref 0–99)
Triglycerides: 67 mg/dL (ref 0–149)
VLDL Cholesterol Cal: 13 mg/dL (ref 5–40)

## 2021-04-02 LAB — VITAMIN D 25 HYDROXY (VIT D DEFICIENCY, FRACTURES): Vit D, 25-Hydroxy: 39.8 ng/mL (ref 30.0–100.0)

## 2021-04-02 NOTE — Progress Notes (Signed)
Contacted via MyChart   Good evening Adrienne [name], your labs have returned and remain at baseline for you.  At this time I recommend continuing all current medications and follow-up visits with oncology.  Any questions? Keep being awesome!!  Thank you for allowing me to participate in your care.  I appreciate you. Kindest regards, Bardia Wangerin

## 2021-04-03 LAB — MULTIPLE MYELOMA PANEL, SERUM
Albumin SerPl Elph-Mcnc: 3.6 g/dL (ref 2.9–4.4)
Albumin/Glob SerPl: 1 (ref 0.7–1.7)
Alpha 1: 0.2 g/dL (ref 0.0–0.4)
Alpha2 Glob SerPl Elph-Mcnc: 0.8 g/dL (ref 0.4–1.0)
B-Globulin SerPl Elph-Mcnc: 1 g/dL (ref 0.7–1.3)
Gamma Glob SerPl Elph-Mcnc: 1.7 g/dL (ref 0.4–1.8)
Globulin, Total: 3.7 g/dL (ref 2.2–3.9)
IgA: 142 mg/dL (ref 87–352)
IgG (Immunoglobin G), Serum: 1892 mg/dL — ABNORMAL HIGH (ref 586–1602)
IgM (Immunoglobulin M), Srm: 62 mg/dL (ref 26–217)
M Protein SerPl Elph-Mcnc: 1 g/dL — ABNORMAL HIGH
Total Protein ELP: 7.3 g/dL (ref 6.0–8.5)

## 2021-04-22 ENCOUNTER — Ambulatory Visit: Payer: Managed Care, Other (non HMO) | Admitting: Dermatology

## 2021-04-22 ENCOUNTER — Other Ambulatory Visit: Payer: Self-pay

## 2021-04-22 DIAGNOSIS — Z79899 Other long term (current) drug therapy: Secondary | ICD-10-CM | POA: Diagnosis not present

## 2021-04-22 DIAGNOSIS — L2081 Atopic neurodermatitis: Secondary | ICD-10-CM | POA: Diagnosis not present

## 2021-04-22 DIAGNOSIS — L309 Dermatitis, unspecified: Secondary | ICD-10-CM

## 2021-04-22 MED ORDER — CLOBETASOL PROPIONATE 0.05 % EX SOLN: 1.0000 "application " | Freq: Every day | CUTANEOUS | 1 refills | Status: AC

## 2021-04-22 NOTE — Progress Notes (Signed)
° °  Follow-Up Visit   Subjective  Adrienne [name] is a 61 y.o. female who presents for the following: Eczema (Dupixent injections q 2 weeks, Clobetasol/CeraVe mix prn flares, Tacrolimus ointment prn. Patient does get a reaction on her thighs that is red, raised, and itchy when she injects there. No reactions on the abdomen or arms. Eczema is clear and not itchy. No symptoms with eyes. ).   The following portions of the chart were reviewed this encounter and updated as appropriate:       Review of Systems:  No other skin or systemic complaints except as noted in HPI or Assessment and Plan.  Objective  Well appearing patient in no apparent distress; mood and affect are within normal limits.  A focused examination was performed including face, legs, trunk. Relevant physical exam findings are noted in the Assessment and Plan.  arms, legs, trunk Violaceous patch with mild scale of the right anterior thigh; hyperpigmented scaly patch on the medial buttocks/gluteal cleft area    Assessment & Plan  Atopic neurodermatitis arms, legs, trunk  Improved on Dupixent.   With injection site reaction on the thighs. Avoid injections in the thighs, recommend alternating injection sites to the abdomen and arms.  Atopic dermatitis - Severe, on Dupixent (biologic medication).  Atopic dermatitis (eczema) is a chronic, relapsing, pruritic condition that can significantly affect quality of life. It is often associated with allergic rhinitis and/or asthma and can require treatment with topical medications, phototherapy, or in severe cases a biologic medication called Dupixent.   Cont Dupixent 300mg /45ml sq injections q 2 wks Cont Clobetasol/Cerave mix qd/bid prn flares, pt will start using on buttocks bid for up to 2 weeks, then switch to tacrolimus Cont Tacrolimus 0.1% oint qd/bid prn flares Cont Cerave cream after shower/bath Recommend mild soap and moisturizing cream 1-2 times daily.     Dupilumab  (Dupixent) is a treatment given by injection for adults and children with moderate-to-severe atopic dermatitis. Goal is control of skin condition, not cure. It is given as 2 injections at the first dose followed by 1 injection ever 2 weeks thereafter.  Young children are dosed monthly.  Potential side effects include allergic reaction, herpes infections, injection site reactions and conjunctivitis (inflammation of the eyes).  The use of Dupixent requires long term medication management, including periodic office visits.   Dermatitis  Related Medications DUPIXENT 300 MG/2ML SOPN INJECT 300MG  SUBCUTANEOUSLY EVERY OTHER WEEK  clobetasol (TEMOVATE) 0.05 % external solution Apply 1 application topically daily. Mix bottle in 1 tub of cerave cream and use qd to trunk, arms and legs, avoid face, groin, axilla   Return in about 6 months (around 10/20/2021) for Atopic Dermatitis.  IJamesetta Orleans, CMA, am acting as scribe for Brendolyn Patty, MD .  Documentation: I have reviewed the above documentation for accuracy and completeness, and I agree with the above.  Brendolyn Patty MD

## 2021-04-22 NOTE — Patient Instructions (Addendum)
Apply clobetasol/CeraVe mix to area on buttocks twice daily for 2 weeks, then switch to tacrolimus ointment for at least 2 weeks.  Dupilumab (Dupixent) is a treatment given by injection for adults and children with moderate-to-severe atopic dermatitis. Goal is control of skin condition, not cure. It is given as 2 injections at the first dose followed by 1 injection ever 2 weeks thereafter.  Young children are dosed monthly.  Potential side effects include allergic reaction, herpes infections, injection site reactions and conjunctivitis (inflammation of the eyes).  The use of Dupixent requires long term medication management, including periodic office visits.   Topical steroids (such as triamcinolone, fluocinolone, fluocinonide, mometasone, clobetasol, halobetasol, betamethasone, hydrocortisone) can cause thinning and lightening of the skin if they are used for too long in the same area. Your physician has selected the right strength medicine for your problem and area affected on the body. Please use your medication only as directed by your physician to prevent side effects.   If You Need Anything After Your Visit  If you have any questions or concerns for your doctor, please call our main line at [phone] and press option 4 to reach your doctor's medical assistant. If no one answers, please leave a voicemail as directed and we will return your call as soon as possible. Messages left after 4 pm will be answered the following business day.   You may also send [place] a message via [place]. We typically respond to MyChart messages within 1-2 business days.  For prescription refills, please ask your pharmacy to contact our office. Our fax number is [phone].  If you have an urgent issue when the clinic is closed that cannot wait until the next business day, you can page your doctor at the number below.    Please note that while we do our best to be available for urgent issues outside of office  hours, we are not available 24/7.   If you have an urgent issue and are unable to reach [place], you may choose to seek medical care at your doctor's office, retail clinic, urgent care center, or emergency room.  If you have a medical emergency, please immediately call 911 or go to the emergency department.  Pager Numbers  - Dr. Nehemiah Massed: [phone]  - Dr. Laurence Ferrari: [phone]  - Dr. Nicole Kindred: [phone]  In the event of inclement weather, please call our main line at [phone] for an update on the status of any delays or closures.  Dermatology Medication Tips: Please keep the boxes that topical medications come in in order to help keep track of the instructions about where and how to use these. Pharmacies typically print the medication instructions only on the boxes and not directly on the medication tubes.   If your medication is too expensive, please contact our office at [phone] option 4 or send [place] a message through [place].   We are unable to tell what your co-pay for medications will be in advance as this is different depending on your insurance coverage. However, we may be able to find a substitute medication at lower cost or fill out paperwork to get insurance to cover a needed medication.   If a prior authorization is required to get your medication covered by your insurance company, please allow [place] 1-2 business days to complete this process.  Drug prices often vary depending on where the prescription is filled and some pharmacies may offer cheaper prices.  The website www.goodrx.com contains coupons for medications through different pharmacies. The  prices here do not account for what the cost may be with help from insurance (it may be cheaper with your insurance), but the website can give you the price if you did not use any insurance.  - You can print the associated coupon and take it with your prescription to the pharmacy.  - You may also stop by our office during regular  business hours and pick up a GoodRx coupon card.  - If you need your prescription sent electronically to a different pharmacy, notify our office through [facility] or by phone at [phone] option 4.     Si Usted Necesita Algo Despus de Su Visita  Tambin puede enviarnos un mensaje a travs de Pharmacist, community. Por lo general respondemos a los mensajes de MyChart en el transcurso de 1 a 2 das hbiles.  Para renovar recetas, por favor pida a su farmacia que se ponga en contacto con nuestra oficina. Harland Dingwall de fax es [place] [phone].  Si tiene un asunto urgente cuando la clnica est cerrada y que no puede esperar hasta el siguiente da hbil, puede llamar/localizar a su doctor(a) al nmero que aparece a continuacin.   Por favor, tenga en cuenta que aunque hacemos todo lo posible para estar disponibles para asuntos urgentes fuera del horario de [place], no estamos disponibles las 24 horas del da, los 7 das de la [place].   Si tiene un problema urgente y no puede comunicarse con nosotros, puede optar por buscar atencin mdica  en el consultorio de su doctor(a), en una clnica privada, en un centro de atencin urgente o en una sala de emergencias.  Si tiene Engineering geologist, por favor llame inmediatamente al 911 o vaya a la sala de emergencias.  Nmeros de bper  - Dr. Nehemiah Massed: [phone]  - Dra. Moye: [phone]  - Dra. Nicole Kindred: [phone]  En caso de inclemencias del [place], por favor llame a Johnsie Kindred principal al [phone] para una actualizacin sobre el [place] de cualquier retraso o cierre.  Consejos para la medicacin en dermatologa: Por favor, guarde las cajas en las que vienen los medicamentos de uso tpico para ayudarle a seguir las instrucciones sobre dnde y cmo usarlos. Las farmacias generalmente imprimen las instrucciones del medicamento slo en las cajas y no directamente en los tubos del [place].   Si su medicamento es muy caro, por  favor, pngase en contacto con Zigmund Daniel llamando al [phone] y presione la opcin 4 o envenos un mensaje a travs de Pharmacist, community.   No podemos decirle cul ser su copago por los medicamentos por adelantado ya que esto es diferente dependiendo de la cobertura de su seguro. Sin embargo, es posible que podamos encontrar un medicamento sustituto a Electrical engineer un formulario para que el seguro cubra el medicamento que se considera necesario.   Si se requiere una autorizacin previa para que su compaa de seguros [place] su medicamento, por favor permtanos de 1 a 2 das hbiles para completar este proceso.  Los precios de los medicamentos varan con frecuencia dependiendo del Environmental consultant de dnde se surte la receta y alguna farmacias pueden ofrecer precios ms baratos.  El sitio web www.goodrx.com tiene cupones para medicamentos de Airline pilot. Los precios aqu no tienen en cuenta lo que podra costar con la ayuda del seguro (puede ser ms barato con su seguro), pero el sitio web puede darle el precio si no utiliz Research scientist (physical sciences).  - Puede imprimir el cupn correspondiente y llevarlo con su receta a  la farmacia.  - Tambin puede pasar por nuestra oficina durante el horario de atencin regular y Charity fundraiser una tarjeta de cupones de GoodRx.  - Si necesita que su receta se enve electrnicamente a una farmacia diferente, informe a nuestra oficina a travs de MyChart de [place] o por telfono llamando al [phone] y presione la opcin 4.

## 2021-05-25 ENCOUNTER — Other Ambulatory Visit: Payer: Self-pay | Admitting: Nurse Practitioner

## 2021-05-25 DIAGNOSIS — I10 Essential (primary) hypertension: Secondary | ICD-10-CM

## 2021-05-27 NOTE — Telephone Encounter (Signed)
Requested Prescriptions  ?Pending Prescriptions Disp Refills  ?? lisinopril (ZESTRIL) 10 MG tablet [Pharmacy Med Name: Lisinopril 10 MG Oral Tablet] 90 tablet 2  ?  Sig: TAKE 1 TABLET BY MOUTH  DAILY  ?  ? Cardiovascular:  ACE Inhibitors Passed - 05/25/2021  4:58 AM  ?  ?  Passed - Cr in normal range and within 180 days  ?  Creatinine  ?Date Value Ref Range Status  ?07/16/2011 0.95 0.60 - 1.30 mg/dL Final  ? ?Creatinine, Ser  ?Date Value Ref Range Status  ?04/01/2021 0.78 0.44 - 1.00 mg/dL Final  ?   ?  ?  Passed - K in normal range and within 180 days  ?  Potassium  ?Date Value Ref Range Status  ?04/01/2021 3.6 3.5 - 5.1 mmol/L Final  ?07/16/2011 3.7 3.5 - 5.1 mmol/L Final  ?   ?  ?  Passed - Patient is not pregnant  ?  ?  Passed - Last BP in normal range  ?  BP Readings from Last 1 Encounters:  ?04/01/21 135/69  ?   ?  ?  Passed - Valid encounter within last 6 months  ?  Recent Outpatient Visits   ?      ? 1 month ago Peripheral neuropathy due to chemotherapy [facility])  ? [facility] [place], [place] T, NP  ? 4 months ago Class 2 severe obesity due to excess calories with serious comorbidity and body mass index (BMI) of 35.0 to 35.9 in adult [facility])  ? [place], [place] T, NP  ? 4 months ago Chronic right shoulder pain  ? [place], [place] T, NP  ? 5 months ago Chronic right shoulder pain  ? [facility] [place], [place] T, NP  ? 7 months ago Class 2 severe obesity due to excess calories with serious comorbidity and body mass index (BMI) of 35.0 to 35.9 in adult [facility])  ? [facility] [place], Henrine Screws T, NP  ?  ?  ?Future Appointments   ?        ? In 4 months [place], Barbaraann Faster, NP [facility]  ? In 5 months Brendolyn Patty, MD [place]  ?  ? ?  ?  ?  ? ?

## 2021-06-19 ENCOUNTER — Ambulatory Visit
Admission: RE | Admit: 2021-06-19 | Discharge: 2021-06-19 | Disposition: A | Discharge status: Home / Self Care | Payer: Managed Care, Other (non HMO) | Source: Ambulatory Visit | Attending: Internal Medicine | Admitting: Internal Medicine

## 2021-06-19 DIAGNOSIS — C50811 Malignant neoplasm of overlapping sites of right female breast: Secondary | ICD-10-CM | POA: Insufficient documentation

## 2021-06-19 DIAGNOSIS — Z853 Personal history of malignant neoplasm of breast: Secondary | ICD-10-CM | POA: Insufficient documentation

## 2021-06-19 DIAGNOSIS — Z171 Estrogen receptor negative status [ER-]: Secondary | ICD-10-CM

## 2021-07-29 ENCOUNTER — Other Ambulatory Visit: Payer: Self-pay | Admitting: Dermatology

## 2021-07-29 DIAGNOSIS — L309 Dermatitis, unspecified: Secondary | ICD-10-CM

## 2021-09-30 ENCOUNTER — Ambulatory Visit: Payer: Managed Care, Other (non HMO) | Admitting: Internal Medicine

## 2021-09-30 ENCOUNTER — Inpatient Hospital Stay: Payer: Managed Care, Other (non HMO) | Attending: Internal Medicine

## 2021-09-30 ENCOUNTER — Encounter: Payer: Self-pay | Admitting: Medical Oncology

## 2021-09-30 ENCOUNTER — Encounter: Payer: Managed Care, Other (non HMO) | Admitting: Nurse Practitioner

## 2021-09-30 ENCOUNTER — Inpatient Hospital Stay (HOSPITAL_BASED_OUTPATIENT_CLINIC_OR_DEPARTMENT_OTHER): Payer: Managed Care, Other (non HMO) | Admitting: Medical Oncology

## 2021-09-30 VITALS — BP 137/68 | HR 76 | Temp 97.8°F | Resp 17 | Wt 208.0 lb

## 2021-09-30 DIAGNOSIS — Z853 Personal history of malignant neoplasm of breast: Secondary | ICD-10-CM | POA: Insufficient documentation

## 2021-09-30 DIAGNOSIS — D649 Anemia, unspecified: Secondary | ICD-10-CM

## 2021-09-30 DIAGNOSIS — Z171 Estrogen receptor negative status [ER-]: Secondary | ICD-10-CM

## 2021-09-30 DIAGNOSIS — C50811 Malignant neoplasm of overlapping sites of right female breast: Secondary | ICD-10-CM | POA: Diagnosis not present

## 2021-09-30 DIAGNOSIS — D472 Monoclonal gammopathy: Secondary | ICD-10-CM | POA: Insufficient documentation

## 2021-09-30 DIAGNOSIS — E639 Nutritional deficiency, unspecified: Secondary | ICD-10-CM

## 2021-09-30 DIAGNOSIS — R5383 Other fatigue: Secondary | ICD-10-CM | POA: Diagnosis not present

## 2021-09-30 DIAGNOSIS — G629 Polyneuropathy, unspecified: Secondary | ICD-10-CM | POA: Insufficient documentation

## 2021-09-30 DIAGNOSIS — Z08 Encounter for follow-up examination after completed treatment for malignant neoplasm: Secondary | ICD-10-CM | POA: Insufficient documentation

## 2021-09-30 LAB — COMPREHENSIVE METABOLIC PANEL
ALT: 15 U/L (ref 0–44)
AST: 20 U/L (ref 15–41)
Albumin: 3.8 g/dL (ref 3.5–5.0)
Alkaline Phosphatase: 72 U/L (ref 38–126)
Anion gap: 4 — ABNORMAL LOW (ref 5–15)
BUN: 19 mg/dL (ref 8–23)
CO2: 25 mmol/L (ref 22–32)
Calcium: 8.9 mg/dL (ref 8.9–10.3)
Chloride: 106 mmol/L (ref 98–111)
Creatinine, Ser: 0.85 mg/dL (ref 0.44–1.00)
GFR, Estimated: >60 mL/min (ref >60)
Glucose, Bld: 116 mg/dL — ABNORMAL HIGH (ref 70–99)
Potassium: 3.6 mmol/L (ref 3.5–5.1)
Sodium: 135 mmol/L (ref 135–145)
Total Bilirubin: 0.6 mg/dL (ref 0.3–1.2)
Total Protein: 8 g/dL (ref 6.5–8.1)

## 2021-09-30 LAB — CBC WITH DIFFERENTIAL/PLATELET
Abs Immature Granulocytes: 0.02 10*3/uL (ref 0.00–0.07)
Basophils Absolute: 0 10*3/uL (ref 0.0–0.1)
Basophils Relative: 1 %
Eosinophils Absolute: 0.1 10*3/uL (ref 0.0–0.5)
Eosinophils Relative: 2 %
HCT: 32 % — ABNORMAL LOW (ref 36.0–46.0)
Hemoglobin: 10.5 g/dL — ABNORMAL LOW (ref 12.0–15.0)
Immature Granulocytes: 0 %
Lymphocytes Relative: 39 %
Lymphs Abs: 2.4 10*3/uL (ref 0.7–4.0)
MCH: 31.4 pg (ref 26.0–34.0)
MCHC: 32.8 g/dL (ref 30.0–36.0)
MCV: 95.8 fL (ref 80.0–100.0)
Monocytes Absolute: 0.4 10*3/uL (ref 0.1–1.0)
Monocytes Relative: 7 %
Neutro Abs: 3.3 10*3/uL (ref 1.7–7.7)
Neutrophils Relative %: 51 %
Platelets: 232 10*3/uL (ref 150–400)
RBC: 3.34 MIL/uL — ABNORMAL LOW (ref 3.87–5.11)
RDW: 13.6 % (ref 11.5–15.5)
WBC: 6.3 10*3/uL (ref 4.0–10.5)
nRBC: 0 % (ref 0.0–0.2)

## 2021-09-30 NOTE — Progress Notes (Signed)
Patient here for oncology follow-up appointment,  concerns of fatigue     °

## 2021-09-30 NOTE — Progress Notes (Signed)
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   # 2012 LEFT BREAST CA STAGE I s/p Lumpec [T1 (0.7CM);Triple Neg; G-3]; s/p mammosite RT; No adj chemo  #  Mild Anemia/M-protein 0.8gm/dl [sep 2016]  # BRCA- 1&2 NEGATIVE [july 2016]; s/p Genetic counseling [June 2020]  # PN s/p acupuncture  --------------------------------------------------     DIAGNOSIS: breast cancer  STAGE:   I      ;GOALS: cure  CURRENT/MOST RECENT THERAPY: surveillaince     History of breast cancer  08/31/2018 Genetic Testing   Two VUS identified on the CustomNext-Cancer+RNAinsight.  CFTR p.R450I and CTNNA1 p.L402S VUS.  The CustomNext-Expanded gene panel offered by [facility] and includes sequencing and rearrangement analysis for the following 81 genes: AIP, ALK, APC*, ATM*, AXIN2, BAP1, BARD1, BLM, BMPR1A, BRCA1*, BRCA2*, BRIP1*, CDC73, CDH1*, CDK4, CDKN1B, CDKN2A, CHEK2*, CTNNA1, DICER1, FANCC, FH, FLCN, GALNT12, HOXB13, KIT, MAX, MEN1, MET, MLH1*, MRE11A, MSH2*, MSH6*, MUTYH*, NBN, NF1*, NF2, NTHL1, PALB2*, PDGFRA, PHOX2B, PMS2*, POLD1, POLE, POT1, PRKAR1A, PTCH1, PTEN*, RAD50, RAD51C*, RAD51D*, RB1, RET, SDHA, SDHAF2, SDHB, SDHC, SDHD, SMAD4, SMARCA4, SMARCB1, SMARCE1, STK11, SUFU, TMEM127, TP53*, TSC1, TSC2, VHL and XRCC2 (sequencing and deletion/duplication); CASR, CFTR, CPA1, CTRC, EGFR, MITF, PRSS1 and SPINK1 (sequencing only); EPCAM and GREM1 (deletion/duplication only). DNA and RNA analyses performed for * genes. The report date is September 01, 2018.     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 Sleeping well and getting about 6-8 hours of sleep. Does not feel tired when she wakes up or on days off. Thinks its from not eating or drinking enough during the day. Hot at work. Usually not eating during the work day or eating something like a piece of pizza. At home she eats a piece of toast and some juice. Other than this she is feeling well. No new bony pains, night sweats. Neuropathy is stable. In terms of her history of breast cancer she recently had a mammogram in April which suggested a follow up in October. She denies any known breast changes. Recently saw her surgeon who did a breast exam in May- defers one today.   Review of Systems  Constitutional:  Negative for chills, diaphoresis, fever, malaise/fatigue and weight loss.  HENT:  Negative for nosebleeds and sore throat.   Eyes:  Negative for double vision.  Respiratory:  Negative for cough, hemoptysis, sputum production, shortness of breath and wheezing.   Cardiovascular:  Negative for chest pain, palpitations, orthopnea and leg swelling.  Gastrointestinal:  Negative for abdominal pain, blood in stool, constipation, diarrhea, heartburn, melena, nausea and vomiting.  Genitourinary:  Negative for dysuria, frequency and urgency.  Musculoskeletal:  Positive for joint pain. Negative for back pain.  Skin: Negative.  Negative for itching and rash.  Neurological:  Positive for tingling. Negative for dizziness, focal weakness, weakness and headaches.  Endo/Heme/Allergies:  Does not bruise/bleed easily.  Psychiatric/Behavioral:  Negative for depression. The patient is not nervous/anxious and does not have insomnia.      PAST MEDICAL HISTORY :  Past Medical History:  Diagnosis Date    Anemia    Asthma    BRCA negative 09/22/2014   Breast cancer ([place]) 2012   LT LUMPECTOMY   Breast cancer ([place]) 2016   RT LUMPECTOMY   Breast screening, unspecified    Constipation    Dry eyes 2012   Eczema    Family history of breast cancer    Family history of ovarian cancer    Family history of prostate cancer    GERD (gastroesophageal reflux disease)    Hematuria    Kidney stone    Lump or mass in breast    Malignant neoplasm of breast (female), unspecified site 07/08/2014   Right breast, 5 mm, T1a,N0, triple negative; high risk for recurrence on Mammoprint.   Malignant neoplasm of upper-outer quadrant of female breast ([place]) 05/08/2010   Left breast: T1b, N0, M); triple negative, DCIS present. No chemotherapy. MammoSite radiation.   Neuropathy    pt states in fingertips and feet   Obesity, unspecified    Pancreatic lesion    Personal history of chemotherapy 2016   BREAST CA- Right   Personal history of malignant neoplasm of breast 2012   has been treated with left breast wide excision and radiation therapy; Patient has DCIS as well as a 7 mm infiltrating mammary carcinoma triple negative removed on 05-08-10   Personal history of radiation therapy 2012   BREAST CA - MAMMOSITE- Left   Personal history of radiation therapy 2016   BREAST CA - MAMMOSITE- Right   Screening for obesity    Seasonal allergies    Special screening for malignant neoplasms, colon    Unspecified essential hypertension     PAST SURGICAL HISTORY :   Past Surgical History:  Procedure Laterality Date   BREAST BIOPSY Right 2016   Invasive   BREAST CYST ASPIRATION Left 2010   BREAST LUMPECTOMY Left 2012   BREAST CA   BREAST LUMPECTOMY Right 2016   INVASIVE MAMMARY CARCINOMA   BREAST LUMPECTOMY WITH AXILLARY LYMPH NODE BIOPSY Right 08/01/2014   Procedure: BREAST LUMPECTOMY WITH AXILLARY LYMPH NODE BIOPSY;  Surgeon: Robert Bellow, MD;  Location: [facility] ORS;  Service: General;  Laterality: Right;    BREAST MAMMOSITE  April 2012   mammosite placement and removal    BREAST SURGERY Left 2012   left breast wide excision, sentinel node, MammoSite   BREAST SURGERY Right 08/06/2014   Wide excision/sentinel node biopsy, T1a, N0 triple negative, MammoSite   CESAREAN SECTION  1988   COLONOSCOPY  October 17, 2009   Dr. Dionne Milo, normal study.   KIDNEY STONE SURGERY  2003   stones removed by Dr. Quillian Quince- stent placed/removed   PORTACATH PLACEMENT Left 11/09/2014   Procedure: INSERTION PORT-A-CATH;  Surgeon: Robert Bellow, MD;  Location: [facility] ORS;  Service: General;  Laterality: Left;   SENTINEL NODE BIOPSY Right 08/01/2014   Procedure: SENTINEL NODE BIOPSY;  Surgeon: Robert Bellow, MD;  Location: [facility] ORS;  Service: General;  Laterality: Right;   TUBAL LIGATION      FAMILY HISTORY :   Family History  Problem Relation Age of Onset   Ovarian cancer Mother    Diabetes Mother    Diabetes Father    Prostate cancer Father        dx in his 61s   Hypertension Brother  Cancer Maternal Grandmother        bone   Cancer Maternal Grandfather        lung   Cancer Paternal Grandmother        lung   Asthma Paternal Grandfather    Hypertension Sister    Prostate cancer Brother 81   AAA (abdominal aortic aneurysm) Maternal Uncle    Breast cancer Other     SOCIAL HISTORY:   Social History   Tobacco Use   Smoking status: Never   Smokeless tobacco: Never  Vaping Use   Vaping Use: Never used  Substance Use Topics   Alcohol use: Yes    Comment: occasionally   Drug use: No    ALLERGIES:  is allergic to cayenne pepper [cayenne], shellfish allergy, and tape.  MEDICATIONS:  Current Outpatient Medications  Medication Sig Dispense Refill   albuterol (VENTOLIN HFA) 108 (90 Base) MCG/ACT inhaler USE 2 INHALATIONS BY MOUTH  EVERY 6 HOURS AS NEEDED FOR WHEEZING OR SHORTNESS OF  BREATH 26.8 g 3   aspirin 81 MG tablet Take 81 mg by mouth daily.     atorvastatin (LIPITOR) 10 MG tablet Take 1  tablet (10 mg total) by mouth daily. 90 tablet 4   bisacodyl (DULCOLAX) 5 MG EC tablet Take 5 mg by mouth daily as needed for moderate constipation.     Cholecalciferol (VITAMIN D3) 1000 UNITS CAPS Take 1 capsule by mouth daily.      clobetasol (TEMOVATE) 0.05 % external solution Apply 1 application topically daily. Mix bottle in 1 tub of cerave cream and use qd to trunk, arms and legs, avoid face, groin, axilla 50 mL 1   DUPIXENT 300 MG/2ML SOPN INJECT 300MG SUBCUTANEOUSLY EVERY OTHER WEEK 4 mL 6   esomeprazole (NEXIUM) 40 MG capsule Take 40 mg by mouth 2 (two) times daily as needed (acid reflux).      lisinopril (ZESTRIL) 10 MG tablet TAKE 1 TABLET BY MOUTH  DAILY 90 tablet 2   loratadine (CLARITIN) 10 MG tablet Take 10 mg by mouth daily. Reported on 04/13/2015     polyethylene glycol (MIRALAX / GLYCOLAX) packet Take 17 g by mouth every other day.     Turmeric 500 MG CAPS Take 500 mg by mouth daily.     No current facility-administered medications for this visit.   Facility-Administered Medications Ordered in Other Visits  Medication Dose Route Frequency Provider Last Rate Last Admin   sodium chloride 0.9 % injection 10 mL  10 mL Intravenous PRN Leia Alf, MD   10 mL at 11/10/14 1138    PHYSICAL EXAMINATION: ECOG PERFORMANCE STATUS: 1 - Symptomatic but completely ambulatory  BP 137/68 (Patient Position: Sitting)   Pulse 76   Temp 97.8 F (36.6 C) (Oral)   Resp 17   Wt 208 lb (94.3 kg)   LMP  (LMP Unknown)   SpO2 100%   BMI 34.61 kg/m   Filed Weights   09/30/21 1051  Weight: 208 lb (94.3 kg)    Physical Exam HENT:     Head: Normocephalic and atraumatic.     Mouth/Throat:     Pharynx: No oropharyngeal exudate.  Eyes:     Pupils: Pupils are equal, round, and reactive to light.  Cardiovascular:     Rate and Rhythm: Normal rate and regular rhythm.  Pulmonary:     Effort: No respiratory distress.     Breath sounds: No wheezing.  Abdominal:     General: Bowel sounds  are normal. There  is no distension.     Palpations: Abdomen is soft. There is no mass.     Tenderness: There is no abdominal tenderness. There is no guarding or rebound.  Musculoskeletal:        General: No tenderness. Normal range of motion.     Cervical back: Normal range of motion and neck supple.  Skin:    General: Skin is warm.  Neurological:     Mental Status: She is alert and oriented to person, place, and time.  Psychiatric:        Mood and Affect: Affect normal.      LABORATORY DATA:  I have reviewed the data as listed    Component Value Date/Time   NA 135 09/30/2021 1002   NA 142 04/01/2021 1027   NA 140 07/16/2011 2130   K 3.6 09/30/2021 1002   K 3.7 07/16/2011 2130   CL 106 09/30/2021 1002   CL 106 07/16/2011 2130   CO2 25 09/30/2021 1002   CO2 27 07/16/2011 2130   GLUCOSE 116 (H) 09/30/2021 1002   GLUCOSE 93 07/16/2011 2130   BUN 19 09/30/2021 1002   BUN 16 04/01/2021 1027   BUN 16 07/16/2011 2130   CREATININE 0.85 09/30/2021 1002   CREATININE 0.95 07/16/2011 2130   CALCIUM 8.9 09/30/2021 1002   CALCIUM 9.0 07/16/2011 2130   PROT 8.0 09/30/2021 1002   PROT 7.1 10/01/2020 0902   ALBUMIN 3.8 09/30/2021 1002   ALBUMIN 4.2 10/01/2020 0902   AST 20 09/30/2021 1002   ALT 15 09/30/2021 1002   ALKPHOS 72 09/30/2021 1002   BILITOT 0.6 09/30/2021 1002   BILITOT 0.3 10/01/2020 0902   GFRNONAA >60 09/30/2021 1002   GFRNONAA >60 07/16/2011 2130   GFRAA 80 04/16/2020 1159   GFRAA >60 07/16/2011 2130    No results found for: "SPEP", "UPEP"  Lab Results  Component Value Date   WBC 6.3 09/30/2021   NEUTROABS 3.3 09/30/2021   HGB 10.5 (L) 09/30/2021   HCT 32.0 (L) 09/30/2021   MCV 95.8 09/30/2021   PLT 232 09/30/2021      Chemistry      Component Value Date/Time   NA 135 09/30/2021 1002   NA 142 04/01/2021 1027   NA 140 07/16/2011 2130   K 3.6 09/30/2021 1002   K 3.7 07/16/2011 2130   CL 106 09/30/2021 1002   CL 106 07/16/2011 2130   CO2 25  09/30/2021 1002   CO2 27 07/16/2011 2130   BUN 19 09/30/2021 1002   BUN 16 04/01/2021 1027   BUN 16 07/16/2011 2130   CREATININE 0.85 09/30/2021 1002   CREATININE 0.95 07/16/2011 2130      Component Value Date/Time   CALCIUM 8.9 09/30/2021 1002   CALCIUM 9.0 07/16/2011 2130   ALKPHOS 72 09/30/2021 1002   AST 20 09/30/2021 1002   ALT 15 09/30/2021 1002   BILITOT 0.6 09/30/2021 1002   BILITOT 0.3 10/01/2020 0902      ASSESSMENT & PLAN:  Encounter Diagnoses  Name Primary?   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 Labs from today pending   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  Clinically no evidence of recurrence; She is agreeable to October mammogram. Continue surveillance.   # Peripheral neuropathy- Chronic and stable-  though to be secondary to chemo. grade 1-2  # chronic mild anemia-unclear etiology. Chronic and stable- probably diet related. Labs pending.   5. Poor diet- Discussed ways to improve diet- focus on nutritionally dense foods rather than calorically dense foods. May want to consider starting a multivitamin. Fluids including water and electrolyte drink during hot work day.    PLAN: Labs today Diagnostic mammogram Oct 2023 RTC 6 months with MD and labs (same as today)      Hughie Closs, PA-C 09/30/2021 11:25 AM

## 2021-10-01 LAB — KAPPA/LAMBDA LIGHT CHAINS
Kappa free light chain: 36.1 mg/L — ABNORMAL HIGH (ref 3.3–19.4)
Kappa, lambda light chain ratio: 2.84 — ABNORMAL HIGH (ref 0.26–1.65)
Lambda free light chains: 12.7 mg/L (ref 5.7–26.3)

## 2021-10-01 LAB — CANCER ANTIGEN 27.29: CA 27.29: 17.2 U/mL (ref 0.0–38.6)

## 2021-10-04 LAB — MULTIPLE MYELOMA PANEL, SERUM
Albumin SerPl Elph-Mcnc: 3.3 g/dL (ref 2.9–4.4)
Albumin/Glob SerPl: 0.9 (ref 0.7–1.7)
Alpha 1: 0.2 g/dL (ref 0.0–0.4)
Alpha2 Glob SerPl Elph-Mcnc: 0.9 g/dL (ref 0.4–1.0)
B-Globulin SerPl Elph-Mcnc: 0.9 g/dL (ref 0.7–1.3)
Gamma Glob SerPl Elph-Mcnc: 1.7 g/dL (ref 0.4–1.8)
Globulin, Total: 3.7 g/dL (ref 2.2–3.9)
IgA: 152 mg/dL (ref 87–352)
IgG (Immunoglobin G), Serum: 1892 mg/dL — ABNORMAL HIGH (ref 586–1602)
IgM (Immunoglobulin M), Srm: 55 mg/dL (ref 26–217)
M Protein SerPl Elph-Mcnc: 1.2 g/dL — ABNORMAL HIGH
Total Protein ELP: 7 g/dL (ref 6.0–8.5)

## 2021-10-17 ENCOUNTER — Other Ambulatory Visit: Payer: Self-pay | Admitting: Nurse Practitioner

## 2021-10-17 NOTE — Telephone Encounter (Signed)
Requested medication (s) are due for refill today: Yes  Requested medication (s) are on the active medication list: Yes  Last refill:  10/09/20  Future visit scheduled: Yes  Notes to clinic:  Prescription has expired.    Requested Prescriptions  Pending Prescriptions Disp Refills   albuterol (VENTOLIN HFA) 108 (90 Base) MCG/ACT inhaler [Pharmacy Med Name: ALBUTEROL HFA 90MCG/ACT (PV)] 26.8 g 3    Sig: USE 2 INHALATIONS BY MOUTH  EVERY 6 HOURS AS NEEDED FOR WHEEZING OR SHORTNESS OF  BREATH     Pulmonology:  Beta Agonists 2 Passed - 10/17/2021  6:16 AM      Passed - Last BP in normal range    BP Readings from Last 1 Encounters:  09/30/21 137/68         Passed - Last Heart Rate in normal range    Pulse Readings from Last 1 Encounters:  09/30/21 76         Passed - Valid encounter within last 12 months    Recent Outpatient Visits           6 months ago Peripheral neuropathy due to chemotherapy ([place])   [place], Jolene T, NP   8 months ago Class 2 severe obesity due to excess calories with serious comorbidity and body mass index (BMI) of 35.0 to 35.9 in adult [facility])   [place] Cannady, Jolene T, NP   9 months ago Chronic right shoulder pain   [place] Lockhart, Chili T, NP   10 months ago Chronic right shoulder pain   [place], [place] T, NP   1 year ago Class 2 severe obesity due to excess calories with serious comorbidity and body mass index (BMI) of 35.0 to 35.9 in adult [facility])   [place], Barbaraann Faster, NP       Future Appointments             In 1 week Brendolyn Patty, MD [place]   In 2 weeks Cannady, Barbaraann Faster, NP [facility]

## 2021-10-27 NOTE — Patient Instructions (Signed)

## 2021-10-29 ENCOUNTER — Ambulatory Visit: Payer: Managed Care, Other (non HMO) | Admitting: Dermatology

## 2021-10-29 DIAGNOSIS — L2081 Atopic neurodermatitis: Secondary | ICD-10-CM

## 2021-10-29 DIAGNOSIS — L820 Inflamed seborrheic keratosis: Secondary | ICD-10-CM | POA: Diagnosis not present

## 2021-10-29 NOTE — Patient Instructions (Addendum)
Dupilumab (Dupixent) is a treatment given by injection for adults and children with moderate-to-severe atopic dermatitis. Goal is control of skin condition, not cure. It is given as 2 injections at the first dose followed by 1 injection ever 2 weeks thereafter.  Young children are dosed monthly.  Potential side effects include allergic reaction, herpes infections, injection site reactions and conjunctivitis (inflammation of the eyes).  The use of Dupixent requires long term medication management, including periodic office visits.  Topical steroids (such as triamcinolone, fluocinolone, fluocinonide, mometasone, clobetasol, halobetasol, betamethasone, hydrocortisone) can cause thinning and lightening of the skin if they are used for too long in the same area. Your physician has selected the right strength medicine for your problem and area affected on the body. Please use your medication only as directed by your physician to prevent side effects.   Cryotherapy Aftercare  Wash gently with soap and water everyday.   Apply Vaseline and Band-Aid daily until healed.   Seborrheic Keratosis  What causes seborrheic keratoses? Seborrheic keratoses are harmless, common skin growths that first appear during adult life.  As time goes by, more growths appear.  Some people may develop a large number of them.  Seborrheic keratoses appear on both covered and uncovered body parts.  They are not caused by sunlight.  The tendency to develop seborrheic keratoses can be inherited.  They vary in color from skin-colored to gray, brown, or even black.  They can be either smooth or have a rough, warty surface.   Seborrheic keratoses are superficial and look as if they were stuck on the skin.  Under the microscope this type of keratosis looks like layers upon layers of skin.  That is why at times the top layer may seem to fall off, but the rest of the growth remains and re-grows.    Treatment Seborrheic keratoses do not need  to be treated, but can easily be removed in the office.  Seborrheic keratoses often cause symptoms when they rub on clothing or jewelry.  Lesions can be in the way of shaving.  If they become inflamed, they can cause itching, soreness, or burning.  Removal of a seborrheic keratosis can be accomplished by freezing, burning, or surgery. If any spot bleeds, scabs, or grows rapidly, please return to have it checked, as these can be an indication of a skin cancer. Due to recent changes in healthcare laws, you may see results of your pathology and/or laboratory studies on MyChart before the doctors have had a chance to review them. We understand that in some cases there may be results that are confusing or concerning to you. Please understand that not all results are received at the same time and often the doctors may need to interpret multiple results in order to provide you with the best plan of care or course of treatment. Therefore, we ask that you please give [place] 2 business days to thoroughly review all your results before contacting the office for clarification. Should we see a critical lab result, you will be contacted sooner.   If You Need Anything After Your Visit  If you have any questions or concerns for your doctor, please call our main line at [phone] and press option 4 to reach your doctor's medical assistant. If no one answers, please leave a voicemail as directed and we will return your call as soon as possible. Messages left after 4 pm will be answered the following business day.   You may also send [place] a message  via [place]. We typically respond to MyChart messages within 1-2 business days.  For prescription refills, please ask your pharmacy to contact our office. Our fax number is [phone].  If you have an urgent issue when the clinic is closed that cannot wait until the next business day, you can page your doctor at the number below.    Please note that while we do our best to be  available for urgent issues outside of office hours, we are not available 24/7.   If you have an urgent issue and are unable to reach [place], you may choose to seek medical care at your doctor's office, retail clinic, urgent care center, or emergency room.  If you have a medical emergency, please immediately call 911 or go to the emergency department.  Pager Numbers  - Dr. Nehemiah Massed: [phone]  - Dr. Laurence Ferrari: [phone]  - Dr. Nicole Kindred: [phone]  In the event of inclement weather, please call our main line at [phone] for an update on the status of any delays or closures.  Dermatology Medication Tips: Please keep the boxes that topical medications come in in order to help keep track of the instructions about where and how to use these. Pharmacies typically print the medication instructions only on the boxes and not directly on the medication tubes.   If your medication is too expensive, please contact our office at [phone] option 4 or send [place] a message through [place].   We are unable to tell what your co-pay for medications will be in advance as this is different depending on your insurance coverage. However, we may be able to find a substitute medication at lower cost or fill out paperwork to get insurance to cover a needed medication.   If a prior authorization is required to get your medication covered by your insurance company, please allow [place] 1-2 business days to complete this process.  Drug prices often vary depending on where the prescription is filled and some pharmacies may offer cheaper prices.  The website www.goodrx.com contains coupons for medications through different pharmacies. The prices here do not account for what the cost may be with help from insurance (it may be cheaper with your insurance), but the website can give you the price if you did not use any insurance.  - You can print the associated coupon and take it with your prescription to the pharmacy.  -  You may also stop by our office during regular business hours and pick up a GoodRx coupon card.  - If you need your prescription sent electronically to a different pharmacy, notify our office through [facility] or by phone at [phone] option 4.     Si Usted Necesita Algo Despus de Su Visita  Tambin puede enviarnos un mensaje a travs de Pharmacist, community. Por lo general respondemos a los mensajes de MyChart en el transcurso de 1 a 2 das hbiles.  Para renovar recetas, por favor pida a su farmacia que se ponga en contacto con nuestra oficina. Harland Dingwall de fax es [place] [phone].  Si tiene un asunto urgente cuando la clnica est cerrada y que no puede esperar hasta el siguiente da hbil, puede llamar/localizar a su doctor(a) al nmero que aparece a continuacin.   Por favor, tenga en cuenta que aunque hacemos todo lo posible para estar disponibles para asuntos urgentes fuera del horario de [place], no estamos disponibles las 24 horas del da, los 7 das de la [place].   Si tiene un problema urgente y  no puede comunicarse con nosotros, puede optar por buscar atencin mdica  en el consultorio de su doctor(a), en una clnica privada, en un centro de atencin urgente o en una sala de emergencias.  Si tiene Engineering geologist, por favor llame inmediatamente al 911 o vaya a la sala de emergencias.  Nmeros de bper  - Dr. Nehemiah Massed: [phone]  - Dra. Moye: [phone]  - Dra. Nicole Kindred: [phone]  En caso de inclemencias del [place], por favor llame a Johnsie Kindred principal al [phone] para una actualizacin sobre el [place] de cualquier retraso o cierre.  Consejos para la medicacin en dermatologa: Por favor, guarde las cajas en las que vienen los medicamentos de uso tpico para ayudarle a seguir las instrucciones sobre dnde y cmo usarlos. Las farmacias generalmente imprimen las instrucciones del medicamento slo en las cajas y no directamente en los tubos del  [place].   Si su medicamento es muy caro, por favor, pngase en contacto con Zigmund Daniel llamando al [phone] y presione la opcin 4 o envenos un mensaje a travs de Pharmacist, community.   No podemos decirle cul ser su copago por los medicamentos por adelantado ya que esto es diferente dependiendo de la cobertura de su seguro. Sin embargo, es posible que podamos encontrar un medicamento sustituto a Electrical engineer un formulario para que el seguro cubra el medicamento que se considera necesario.   Si se requiere una autorizacin previa para que su compaa de seguros [place] su medicamento, por favor permtanos de 1 a 2 das hbiles para completar este proceso.  Los precios de los medicamentos varan con frecuencia dependiendo del Environmental consultant de dnde se surte la receta y alguna farmacias pueden ofrecer precios ms baratos.  El sitio web www.goodrx.com tiene cupones para medicamentos de Airline pilot. Los precios aqu no tienen en cuenta lo que podra costar con la ayuda del seguro (puede ser ms barato con su seguro), pero el sitio web puede darle el precio si no utiliz Research scientist (physical sciences).  - Puede imprimir el cupn correspondiente y llevarlo con su receta a la farmacia.  - Tambin puede pasar por nuestra oficina durante el horario de atencin regular y Charity fundraiser una tarjeta de cupones de GoodRx.  - Si necesita que su receta se enve electrnicamente a una farmacia diferente, informe a nuestra oficina a travs de MyChart de [place] o por telfono llamando al [phone] y presione la opcin 4.

## 2021-10-29 NOTE — Progress Notes (Signed)
   Follow-Up Visit   Subjective  Adrienne [name] is a 61 y.o. female who presents for the following: Atopic Neurodermatitis (Arms, legs trunk. Controlled on Dupixent injections every 2 weeks and Clobetasol/CeraVe mix as needed. She also has tacrolimus ointment, but hasn't needed to use. ). No eye irritation. Started 06/17/2020. She also has a growth on the back of the left ear that she scratched and it bled recently.   The following portions of the chart were reviewed this encounter and updated as appropriate:       Review of Systems:  No other skin or systemic complaints except as noted in HPI or Assessment and Plan.  Objective  Well appearing patient in no apparent distress; mood and affect are within normal limits.  A focused examination was performed including face, trunk, extremities. Relevant physical exam findings are noted in the Assessment and Plan.  trunk, extremities Hyperpigmented patch of the spinal mid back. Skin otherwise clear  L postauricular Erythematous stuck-on, waxy papule or plaque    Assessment & Plan  Atopic neurodermatitis trunk, extremities  Chronic condition with duration or expected duration over one year. Currently well-controlled on Dupixent.     Atopic dermatitis - Severe, on Dupixent (biologic medication).  Atopic dermatitis (eczema) is a chronic, relapsing, pruritic condition that can significantly affect quality of life. It is often associated with allergic rhinitis and/or asthma and can require treatment with topical medications, phototherapy, or in severe cases a biologic medication called Dupixent.    Cont Dupixent '300mg'$ /44m sq injections q 2 wks. Pt has refills. Cont Clobetasol/Cerave mix qd/bid prn flares Cont Tacrolimus 0.1% oint qd/bid prn flares Cont Cerave cream after shower/bath Recommend mild soap and moisturizing cream 1-2 times daily.     Dupilumab (Dupixent) is a treatment given by injection for adults and children with  moderate-to-severe atopic dermatitis. Goal is control of skin condition, not cure. It is given as 2 injections at the first dose followed by 1 injection ever 2 weeks thereafter.  Young children are dosed monthly.   Potential side effects include allergic reaction, herpes infections, injection site reactions and conjunctivitis (inflammation of the eyes).  The use of Dupixent requires long term medication management, including periodic office visits.      Inflamed seborrheic keratosis L postauricular  Symptomatic, irritating, patient would like treated.  Destruction of lesion - L postauricular  Destruction method: cryotherapy   Informed consent: discussed and consent obtained   Lesion destroyed using liquid nitrogen: Yes   Region frozen until ice ball extended beyond lesion: Yes   Outcome: patient tolerated procedure well with no complications   Post-procedure details: wound care instructions given   Additional details:  Prior to procedure, discussed risks of blister formation, small wound, skin dyspigmentation, or rare scar following cryotherapy. Recommend Vaseline ointment to treated areas while healing.    Return in about 6 months (around 05/01/2022) for Atopic Dermatitis.   Documentation: I have reviewed the above documentation for accuracy and completeness, and I agree with the above.  TBrendolyn PattyMD

## 2021-11-01 ENCOUNTER — Ambulatory Visit (INDEPENDENT_AMBULATORY_CARE_PROVIDER_SITE_OTHER): Payer: Managed Care, Other (non HMO) | Admitting: Nurse Practitioner

## 2021-11-01 ENCOUNTER — Encounter: Payer: Self-pay | Admitting: Nurse Practitioner

## 2021-11-01 VITALS — BP 120/72 | HR 72 | Temp 97.9°F | Ht 65.0 in | Wt 205.9 lb

## 2021-11-01 DIAGNOSIS — K219 Gastro-esophageal reflux disease without esophagitis: Secondary | ICD-10-CM

## 2021-11-01 DIAGNOSIS — Z6835 Body mass index (BMI) 35.0-35.9, adult: Secondary | ICD-10-CM

## 2021-11-01 DIAGNOSIS — Z23 Encounter for immunization: Secondary | ICD-10-CM | POA: Diagnosis not present

## 2021-11-01 DIAGNOSIS — R7303 Prediabetes: Secondary | ICD-10-CM

## 2021-11-01 DIAGNOSIS — G620 Drug-induced polyneuropathy: Secondary | ICD-10-CM | POA: Diagnosis not present

## 2021-11-01 DIAGNOSIS — D649 Anemia, unspecified: Secondary | ICD-10-CM

## 2021-11-01 DIAGNOSIS — E559 Vitamin D deficiency, unspecified: Secondary | ICD-10-CM

## 2021-11-01 DIAGNOSIS — I10 Essential (primary) hypertension: Secondary | ICD-10-CM | POA: Diagnosis not present

## 2021-11-01 DIAGNOSIS — M85851 Other specified disorders of bone density and structure, right thigh: Secondary | ICD-10-CM

## 2021-11-01 DIAGNOSIS — E782 Mixed hyperlipidemia: Secondary | ICD-10-CM

## 2021-11-01 DIAGNOSIS — D472 Monoclonal gammopathy: Secondary | ICD-10-CM

## 2021-11-01 DIAGNOSIS — T451X5A Adverse effect of antineoplastic and immunosuppressive drugs, initial encounter: Secondary | ICD-10-CM

## 2021-11-01 DIAGNOSIS — Z0000 Encounter for general adult medical examination without abnormal findings: Secondary | ICD-10-CM

## 2021-11-01 DIAGNOSIS — J4520 Mild intermittent asthma, uncomplicated: Secondary | ICD-10-CM

## 2021-11-01 DIAGNOSIS — Z853 Personal history of malignant neoplasm of breast: Secondary | ICD-10-CM

## 2021-11-01 MED ORDER — ATORVASTATIN CALCIUM 10 MG PO TABS: 10.0000 mg | ORAL_TABLET | Freq: Every day | ORAL | 4 refills | Status: DC

## 2021-11-01 MED ORDER — LISINOPRIL 10 MG PO TABS: 10.0000 mg | ORAL_TABLET | Freq: Every day | ORAL | 4 refills | Status: DC

## 2021-11-01 NOTE — Assessment & Plan Note (Signed)
Chronic, stable with BP well at goal in office and at home on checks.  Continue current medication regimen and adjust as needed.  Recommend she continue to monitor BP regularly at home and document + focus on DASH diet.  LABS: TSH, Lipid - CBC and CMP up to date.  Return to office in 6 months.

## 2021-11-01 NOTE — Assessment & Plan Note (Signed)
Monitored by oncology--recent labs reviewed.  Continue multivitamin at home. 

## 2021-11-01 NOTE — Assessment & Plan Note (Signed)
Continue collaboration with oncology, recent notes reviewed.  Patient denies any pain today.

## 2021-11-01 NOTE — Assessment & Plan Note (Signed)
Chronic, ongoing.  Continue current medication regimen and adjust as needed. Lipid panel today. 

## 2021-11-01 NOTE — Assessment & Plan Note (Signed)
Chronic, stable with recent levels at goal.  Continue supplement and check level today.

## 2021-11-01 NOTE — Assessment & Plan Note (Signed)
Noted on DEXA 06/18/20 -- at this time continue supplements daily and educated patient at length on finding.  Plan on repeat DEXA 06/18/2025.  Check Vitamin D today.

## 2021-11-01 NOTE — Assessment & Plan Note (Signed)
Chronic, stable with minimal use of inhaler.  Continue current medication regimen and adjust as needed.  Spirometry next year.

## 2021-11-01 NOTE — Assessment & Plan Note (Signed)
Continue to collaborate with oncology and review notes. 

## 2021-11-01 NOTE — Assessment & Plan Note (Addendum)
Noted on past labs, recheck A1c today and continue diet focus.  Initiate medications as needed.  A1c 5.9% last visit and urine ALB 30, stable.

## 2021-11-01 NOTE — Assessment & Plan Note (Addendum)
BMI 34.26 - praised for 12 pounds loss.  Recommended eating smaller high protein, low fat meals more frequently and exercising 30 mins a day 5 times a week with a goal of 10-15lb weight loss in the next 3 months. Patient voiced their understanding and motivation to adhere to these recommendations.

## 2021-11-01 NOTE — Assessment & Plan Note (Signed)
Chronic, stable with minimal use of Nexium.  Recommend she continue this only as needed.  Mag level today.

## 2021-11-01 NOTE — Assessment & Plan Note (Signed)
Chronic, stable without medication at this time.  Has had side effects with Lyrica and Gabapentin, extreme fatigue.  Check Mag level today.

## 2021-11-01 NOTE — Progress Notes (Signed)
BP 120/72   Pulse 72   Temp 97.9 F (36.6 C) (Oral)   Ht 5' 5"  (1.651 m)   Wt 205 lb 14.4 oz (93.4 kg)   LMP  (LMP Unknown)   SpO2 98%   BMI 34.26 kg/m    Subjective:    Patient ID: Adrienne [name], female    DOB: [birth year], 61 y.o.   MRN: [id number]  HPI: Adrienne [name] is a 61 y.o. female presenting on 11/01/2021 for comprehensive medical examination. Current medical complaints include:none  She currently lives with: husband Menopausal Symptoms: no   HYPERTENSION/HLD Currently taking Lisinopril & Atorvastatin and ASA.    Her last A1c 5.9% remaining in prediabetic range.  Has been making diet changes and lost 12 pounds. Hypertension status: controlled  Satisfied with current treatment? yes Duration of hypertension: chronic BP monitoring frequency:  daily BP range: 120/70 range BP medication side effects:  no Medication compliance: good compliance Aspirin: yes Recurrent headaches: no Visual changes: no Palpitations: no Dyspnea: no Chest pain: no Lower extremity edema: baseline no worsening Dizzy/lightheaded: no  The 10-year ASCVD risk score (Arnett DK, et al., 2019) is: 5.5%   Values used to calculate the score:     Age: 70 years     Sex: Female     Is Non-Hispanic African American: Yes     Diabetic: No     Tobacco smoker: No     Systolic Blood Pressure: 124 mmHg     Is BP treated: Yes     HDL Cholesterol: 58 mg/dL     Total Cholesterol: 159 mg/dL  MGUS: Follows with oncology and 09/30/21.  Continues to follow with Dr. Bary Castilla annually due to breast cancer history, has been survivor -- left 2012 and right 2016 -- Dr. Bary Castilla is retiring, did recently visit with him.  They are monitoring her anemia.  Denies any symptoms or pain today.  OSTEOPENIA DEXA 06/18/20 = the BMD measured at Femur Neck Right is 0.854 g/cm2 with a T-score of -1.3.  Taking Vitamin D and multivitamin daily. Adequate calcium & vitamin D: yes Weight bearing exercises: yes   ASTHMA Has  Albuterol PRN and Claritin PRN. Asthma status: stable Satisfied with current treatment?: yes Albuterol/rescue inhaler frequency: uses a few times a year Dyspnea frequency: none Wheezing frequency: none Cough frequency: none Nocturnal symptom frequency: none Limitation of activity: no Current upper respiratory symptoms: no Triggers: seasonal Home peak flows: none Last Spirometry: unknown Failed/intolerant to following asthma meds: none Asthma meds in past: none Aerochamber/spacer use: no Visits to ER or Urgent Care in past year: no Pneumovax: Up to Date Influenza: Up to Date  GERD Continues on Nexium as needed. GERD control status: controlled Satisfied with current treatment? yes Heartburn frequency: occasional Medication side effects: no  Medication compliance: stable Previous GERD medications: Pepcid Antacid use frequency:  none Dysphagia: no Odynophagia:  no Hematemesis: no Blood in stool: no EGD: no   Depression Screen done today and results listed below:     11/01/2021    1:08 PM 10/01/2020    8:12 AM 04/16/2020   11:20 AM 09/26/2019   10:30 AM 09/20/2018    8:22 AM  Depression screen PHQ 2/9  Decreased Interest 0 0 0 0 0  Down, Depressed, Hopeless 0 0 0 0 0  PHQ - 2 Score 0 0 0 0 0  Altered sleeping 0    0  Tired, decreased energy 1    0  Change in appetite 0  0  Feeling bad or failure about yourself  0    0  Trouble concentrating 1    0  Moving slowly or fidgety/restless 0    0  Suicidal thoughts 0    0  PHQ-9 Score 2    0  Difficult doing work/chores Not difficult at all    Not difficult at all    The patient does not have a history of falls. I did not complete a risk assessment for falls. A plan of care for falls was not documented.   Past Medical History:  Past Medical History:  Diagnosis Date   Anemia    Asthma    BRCA negative 09/22/2014   Breast cancer ([place]) 2012   LT LUMPECTOMY   Breast cancer ([place]) 2016   RT LUMPECTOMY   Breast  screening, unspecified    Constipation    Dry eyes 2012   Eczema    Family history of breast cancer    Family history of ovarian cancer    Family history of prostate cancer    GERD (gastroesophageal reflux disease)    Hematuria    Kidney stone    Lump or mass in breast    Malignant neoplasm of breast (female), unspecified site 07/08/2014   Right breast, 5 mm, T1a,N0, triple negative; high risk for recurrence on Mammoprint.   Malignant neoplasm of upper-outer quadrant of female breast ([place]) 05/08/2010   Left breast: T1b, N0, M); triple negative, DCIS present. No chemotherapy. MammoSite radiation.   Neuropathy    pt states in fingertips and feet   Obesity, unspecified    Pancreatic lesion    Personal history of chemotherapy 2016   BREAST CA- Right   Personal history of malignant neoplasm of breast 2012   has been treated with left breast wide excision and radiation therapy; Patient has DCIS as well as a 7 mm infiltrating mammary carcinoma triple negative removed on 05-08-10   Personal history of radiation therapy 2012   BREAST CA - MAMMOSITE- Left   Personal history of radiation therapy 2016   BREAST CA - MAMMOSITE- Right   Screening for obesity    Seasonal allergies    Special screening for malignant neoplasms, colon    Unspecified essential hypertension     Surgical History:  Past Surgical History:  Procedure Laterality Date   BREAST BIOPSY Right 2016   Invasive   BREAST CYST ASPIRATION Left 2010   BREAST LUMPECTOMY Left 2012   BREAST CA   BREAST LUMPECTOMY Right 2016   INVASIVE MAMMARY CARCINOMA   BREAST LUMPECTOMY WITH AXILLARY LYMPH NODE BIOPSY Right 08/01/2014   Procedure: BREAST LUMPECTOMY WITH AXILLARY LYMPH NODE BIOPSY;  Surgeon: Robert Bellow, MD;  Location: [facility] OR[facility];  Service: General;  Laterality: Right;   BREAST MAMMOSITE  April 2012   mammosite placement and removal    BREAST SURGERY Left 2012   left breast wide excision, sentinel node, MammoSite    BREAST SURGERY Right 08/06/2014   Wide excision/sentinel node biopsy, T1a, N0 triple negative, MammoSite   CESAREAN SECTION  1988   COLONOSCOPY  October 17, 2009   Dr. Dionne Milo, normal study.   KIDNEY STONE SURGERY  2003   stones removed by Dr. Quillian Quince- stent placed/removed   PORTACATH PLACEMENT Left 11/09/2014   Procedure: INSERTION PORT-A-CATH;  Surgeon: Robert Bellow, MD;  Location: [facility];  Service: General;  Laterality: Left;   SENTINEL NODE BIOPSY Right 08/01/2014   Procedure: SENTINEL NODE BIOPSY;  Surgeon: Dellis Filbert  Amedeo Kinsman, MD;  Location: [facility];  Service: General;  Laterality: Right;   TUBAL LIGATION      Medications:  Current Outpatient Medications on File Prior to Visit  Medication Sig   albuterol (VENTOLIN HFA) 108 (90 Base) MCG/ACT inhaler USE 2 INHALATIONS BY MOUTH  EVERY 6 HOURS AS NEEDED FOR WHEEZING OR SHORTNESS OF  BREATH   aspirin 81 MG tablet Take 81 mg by mouth daily.   bisacodyl (DULCOLAX) 5 MG EC tablet Take 5 mg by mouth daily as needed for moderate constipation.   Cholecalciferol (VITAMIN D3) 1000 UNITS CAPS Take 1 capsule by mouth daily.    clobetasol (TEMOVATE) 0.05 % external solution Apply 1 application topically daily. Mix bottle in 1 tub of cerave cream and use qd to trunk, arms and legs, avoid face, groin, axilla   DUPIXENT 300 MG/2ML SOPN INJECT 300MG SUBCUTANEOUSLY EVERY OTHER WEEK   esomeprazole (NEXIUM) 40 MG capsule Take 40 mg by mouth 2 (two) times daily as needed (acid reflux).    loratadine (CLARITIN) 10 MG tablet Take 10 mg by mouth daily as needed. Reported on 04/13/2015   polyethylene glycol (MIRALAX / GLYCOLAX) packet Take 17 g by mouth every other day.   Turmeric 500 MG CAPS Take 500 mg by mouth daily.   Current Facility-Administered Medications on File Prior to Visit  Medication   sodium chloride 0.9 % injection 10 mL    Allergies:  Allergies  Allergen Reactions   Cayenne Pepper [Cayenne] Shortness Of Breath   Shellfish Allergy  Other (See Comments)    Hives and swelling on ears and feet   Tape Rash    Surgical tape from breast biopsy    Social History:  Social History   Socioeconomic History   Marital status: Single    Spouse name: Not on file   Number of children: Not on file   Years of education: Not on file   Highest education level: Not on file  Occupational History   Not on file  Tobacco Use   Smoking status: Never   Smokeless tobacco: Never  Vaping Use   Vaping Use: Never used  Substance and Sexual Activity   Alcohol use: Yes    Comment: occasionally   Drug use: No   Sexual activity: Yes  Other Topics Concern   Not on file  Social History Narrative   Not on file   Social Determinants of Health   Financial Resource Strain: Not on file  Food Insecurity: Not on file  Transportation Needs: Not on file  Physical Activity: Not on file  Stress: Not on file  Social Connections: Not on file  Intimate Partner Violence: Not on file   Social History   Tobacco Use  Smoking Status Never  Smokeless Tobacco Never   Social History   Substance and Sexual Activity  Alcohol Use Yes   Comment: occasionally    Family History:  Family History  Problem Relation Age of Onset   Ovarian cancer Mother    Diabetes Mother    Diabetes Father    Prostate cancer Father        dx in his 61s   Hypertension Brother    Cancer Maternal Grandmother        bone   Cancer Maternal Grandfather        lung   Cancer Paternal Grandmother        lung   Asthma Paternal Grandfather    Hypertension Sister    Prostate cancer  Brother 43   AAA (abdominal aortic aneurysm) Maternal Uncle    Breast cancer Other    Past medical history, surgical history, medications, allergies, family history and social history reviewed with patient today and changes made to appropriate areas of the chart.   Review of Systems - negative All other ROS negative except what is listed above and in the HPI.      Objective:    BP  120/72   Pulse 72   Temp 97.9 F (36.6 C) (Oral)   Ht 5' 5"  (1.651 m)   Wt 205 lb 14.4 oz (93.4 kg)   LMP  (LMP Unknown)   SpO2 98%   BMI 34.26 kg/m   Wt Readings from Last 3 Encounters:  11/01/21 205 lb 14.4 oz (93.4 kg)  09/30/21 208 lb (94.3 kg)  04/01/21 217 lb 3.2 oz (98.5 kg)    Physical Exam Vitals and nursing note reviewed.  Constitutional:      General: She is awake. She is not in acute distress.    Appearance: She is well-developed. She is not ill-appearing.  HENT:     Head: Normocephalic and atraumatic.     Right Ear: Hearing, tympanic membrane, ear canal and external ear normal. No drainage.     Left Ear: Hearing, tympanic membrane, ear canal and external ear normal. No drainage.     Nose: Nose normal.     Right Sinus: No maxillary sinus tenderness or frontal sinus tenderness.     Left Sinus: No maxillary sinus tenderness or frontal sinus tenderness.     Mouth/Throat:     Mouth: Mucous membranes are moist.     Pharynx: Oropharynx is clear. Uvula midline. No pharyngeal swelling, oropharyngeal exudate or posterior oropharyngeal erythema.  Eyes:     General: Lids are normal.        Right eye: No discharge.        Left eye: No discharge.     Extraocular Movements: Extraocular movements intact.     Conjunctiva/sclera: Conjunctivae normal.     Pupils: Pupils are equal, round, and reactive to light.     Visual Fields: Right eye visual fields normal and left eye visual fields normal.  Neck:     Thyroid: No thyromegaly.     Vascular: No carotid bruit.     Trachea: Trachea normal.  Cardiovascular:     Rate and Rhythm: Normal rate and regular rhythm.     Heart sounds: Normal heart sounds. No murmur heard.    No gallop.  Pulmonary:     Effort: Pulmonary effort is normal. No accessory muscle usage or respiratory distress.     Breath sounds: Normal breath sounds.  Chest:     Comments: Deferred due to recent exams. Abdominal:     General: Bowel sounds are normal.      Palpations: Abdomen is soft. There is no hepatomegaly or splenomegaly.     Tenderness: There is no abdominal tenderness.  Genitourinary:    Vagina: Normal.     Cervix: Normal.     Uterus: Normal.      Adnexa: Right adnexa normal.     Rectum: Normal.  Musculoskeletal:        General: Normal range of motion.     Cervical back: Normal range of motion and neck supple.     Right lower leg: No edema.     Left lower leg: No edema.  Lymphadenopathy:     Head:     Right side of head:  No submental, submandibular, tonsillar, preauricular or posterior auricular adenopathy.     Left side of head: No submental, submandibular, tonsillar, preauricular or posterior auricular adenopathy.     Cervical: No cervical adenopathy.  Skin:    General: Skin is warm and dry.     Capillary Refill: Capillary refill takes less than 2 seconds.     Findings: No rash.  Neurological:     Mental Status: She is alert and oriented to person, place, and time.     Gait: Gait is intact.     Deep Tendon Reflexes: Reflexes are normal and symmetric.     Reflex Scores:      Brachioradialis reflexes are 2+ on the right side and 2+ on the left side.      Patellar reflexes are 2+ on the right side and 2+ on the left side. Psychiatric:        Attention and Perception: Attention normal.        Mood and Affect: Mood normal.        Speech: Speech normal.        Behavior: Behavior normal. Behavior is cooperative.        Thought Content: Thought content normal.        Judgment: Judgment normal.    Results for orders placed or performed in visit on 09/30/21  Kappa/lambda light chains  Result Value Ref Range   Kappa free light chain 36.1 (H) 3.3 - 19.4 mg/L   Lambda free light chains 12.7 5.7 - 26.3 mg/L   Kappa, lambda light chain ratio 2.84 (H) 0.26 - 1.65  Comprehensive metabolic panel  Result Value Ref Range   Sodium 135 135 - 145 mmol/L   Potassium 3.6 3.5 - 5.1 mmol/L   Chloride 106 98 - 111 mmol/L   CO2 25 22 - 32  mmol/L   Glucose, Bld 116 (H) 70 - 99 mg/dL   BUN 19 8 - 23 mg/dL   Creatinine, Ser 0.85 0.44 - 1.00 mg/dL   Calcium 8.9 8.9 - 10.3 mg/dL   Total Protein 8.0 6.5 - 8.1 g/dL   Albumin 3.8 3.5 - 5.0 g/dL   AST 20 15 - 41 U/L   ALT 15 0 - 44 U/L   Alkaline Phosphatase 72 38 - 126 U/L   Total Bilirubin 0.6 0.3 - 1.2 mg/dL   GFR, Estimated >60 >60 mL/min   Anion gap 4 (L) 5 - 15  Cancer antigen 27.29  Result Value Ref Range   CA 27.29 17.2 0.0 - 38.6 U/mL  Multiple Myeloma Panel (SPEP&IFE w/QIG)  Result Value Ref Range   IgG (Immunoglobin G), Serum 1,892 (H) 586 - 1,602 mg/dL   IgA 152 87 - 352 mg/dL   IgM (Immunoglobulin M), Srm 55 26 - 217 mg/dL   Total Protein ELP 7.0 6.0 - 8.5 g/dL   Albumin SerPl Elph-Mcnc 3.3 2.9 - 4.4 g/dL   Alpha 1 0.2 0.0 - 0.4 g/dL   Alpha2 Glob SerPl Elph-Mcnc 0.9 0.4 - 1.0 g/dL   B-Globulin SerPl Elph-Mcnc 0.9 0.7 - 1.3 g/dL   Gamma Glob SerPl Elph-Mcnc 1.7 0.4 - 1.8 g/dL   M Protein SerPl Elph-Mcnc 1.2 (H) Not Observed g/dL   Globulin, Total 3.7 2.2 - 3.9 g/dL   Albumin/Glob SerPl 0.9 0.7 - 1.7   IFE 1 Comment (A)    Please Note Comment   CBC with Differential/Platelet  Result Value Ref Range   WBC 6.3 4.0 - 10.5 K/uL   RBC  3.34 (L) 3.87 - 5.11 MIL/uL   Hemoglobin 10.5 (L) 12.0 - 15.0 g/dL   HCT 32.0 (L) 36.0 - 46.0 %   MCV 95.8 80.0 - 100.0 fL   MCH 31.4 26.0 - 34.0 pg   MCHC 32.8 30.0 - 36.0 g/dL   RDW 13.6 11.5 - 15.5 %   Platelets 232 150 - 400 K/uL   nRBC 0.0 0.0 - 0.2 %   Neutrophils Relative % 51 %   Neutro Abs 3.3 1.7 - 7.7 K/uL   Lymphocytes Relative 39 %   Lymphs Abs 2.4 0.7 - 4.0 K/uL   Monocytes Relative 7 %   Monocytes Absolute 0.4 0.1 - 1.0 K/uL   Eosinophils Relative 2 %   Eosinophils Absolute 0.1 0.0 - 0.5 K/uL   Basophils Relative 1 %   Basophils Absolute 0.0 0.0 - 0.1 K/uL   Immature Granulocytes 0 %   Abs Immature Granulocytes 0.02 0.00 - 0.07 K/uL      Assessment & Plan:   Problem List Items Addressed This Visit        Cardiovascular and Mediastinum   Hypertension    Chronic, stable with BP well at goal in office and at home on checks.  Continue current medication regimen and adjust as needed.  Recommend she continue to monitor BP regularly at home and document + focus on DASH diet.  LABS: TSH, Lipid - CBC and CMP up to date.  Return to office in 6 months.      Relevant Medications   lisinopril (ZESTRIL) 10 MG tablet   atorvastatin (LIPITOR) 10 MG tablet   Other Relevant Orders   TSH     Respiratory   Asthma    Chronic, stable with minimal use of inhaler.  Continue current medication regimen and adjust as needed.  Spirometry next year.        Digestive   GERD without esophagitis    Chronic, stable with minimal use of Nexium.  Recommend she continue this only as needed.  Mag level today.      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  Has had side effects with Lyrica and Gabapentin, extreme fatigue.  Check Mag level today.        Musculoskeletal and Integument   Osteopenia of neck of right femur    Noted on DEXA 06/18/20 -- at this time continue supplements daily and educated patient at length on finding.  Plan on repeat DEXA 06/18/2025.  Check Vitamin D today.      Relevant Orders   VITAMIN D 25 Hydroxy (Vit-D Deficiency, Fractures)     Other   Class 2 severe obesity due to excess calories with serious comorbidity and body mass index (BMI) of 35.0 to 35.9 in adult (HCC)    BMI 34.26 - praised for 12 pounds loss.  Recommended eating smaller high protein, low fat meals more frequently and exercising 30 mins a day 5 times a week with a goal of 10-15lb weight loss in the next 3 months. Patient voiced their understanding and motivation to adhere to these recommendations.       History of breast cancer    Continue to collaborate with oncology and review notes.       Hyperlipemia, mixed    Chronic, ongoing.  Continue current medication regimen and adjust as needed.  Lipid panel today.  Relevant Medications   lisinopril (ZESTRIL) 10 MG tablet   atorvastatin (LIPITOR) 10 MG tablet   Other Relevant Orders   Lipid Panel w/o Chol/HDL Ratio   Low hemoglobin    Monitored by oncology--recent labs reviewed.  Continue multivitamin at home.      MGUS (monoclonal gammopathy of unknown significance)    Continue collaboration with oncology, recent notes reviewed.  Patient denies any pain today.      Prediabetes    Noted on past labs, recheck A1c today and continue diet focus.  Initiate medications as needed.  A1c 5.9% last visit and urine ALB 30, stable.      Relevant Orders   HgB A1c   Vitamin D deficiency    Chronic, stable with recent levels at goal.  Continue supplement and check level today.      Relevant Orders   VITAMIN D 25 Hydroxy (Vit-D Deficiency, Fractures)   Other Visit Diagnoses     Flu vaccine need       Flu vaccine today.   Relevant Orders   Flu Vaccine QUAD 6+ mos PF IM (Fluarix Quad PF) (Completed)   Encounter for annual physical exam       Annual physical today with labs and health maintenance reviewed, discussed with patient.        Follow up plan: Return in about 6 months (around 05/04/2022) for HTN/HLD, GERD, ASTHMA, MGUS.   LABORATORY TESTING:  - Pap smear: Up To Date  IMMUNIZATIONS:   - Tdap: Tetanus vaccination status reviewed: last tetanus booster within 10 years. - Influenza: Up to date - Pneumovax: Up to date - Prevnar: Not applicable - HPV: Not applicable - Zostavax vaccine: Up to date  SCREENING: -Mammogram: Up to date  - Colonoscopy: Up to date  - Bone Density: Not applicable  -Hearing Test: Not applicable  -Spirometry: Not applicable   PATIENT COUNSELING:   Advised to take 1 mg of folate supplement per day if capable of pregnancy.   Sexuality: Discussed sexually transmitted diseases,  partner selection, use of condoms, avoidance of unintended pregnancy  and contraceptive alternatives.   Advised to avoid cigarette smoking.  I discussed with the patient that most people either abstain from alcohol or drink within safe limits (<=14/week and <=4 drinks/occasion for males, <=7/weeks and <= 3 drinks/occasion for females) and that the risk for alcohol disorders and other health effects rises proportionally with the number of drinks per week and how often a drinker exceeds daily limits.  Discussed cessation/primary prevention of drug use and availability of treatment for abuse.   Diet: Encouraged to adjust caloric intake to maintain  or achieve ideal body weight, to reduce intake of dietary saturated fat and total fat, to limit sodium intake by avoiding high sodium foods and not adding table salt, and to maintain adequate dietary potassium and calcium preferably from fresh fruits, vegetables, and low-fat dairy products.    Stressed the importance of regular exercise  Injury prevention: Discussed safety belts, safety helmets, smoke detector, smoking near bedding or upholstery.   Dental health: Discussed importance of regular tooth brushing, flossing, and dental visits.    NEXT PREVENTATIVE PHYSICAL DUE IN 1 YEAR. Return in about 6 months (around 05/04/2022) for HTN/HLD, GERD, ASTHMA, MGUS.

## 2021-11-02 LAB — LIPID PANEL W/O CHOL/HDL RATIO
Cholesterol, Total: 151 mg/dL (ref 100–199)
HDL: 66 mg/dL (ref >39)
LDL Chol Calc (NIH): 74 mg/dL (ref 0–99)
Triglycerides: 52 mg/dL (ref 0–149)
VLDL Cholesterol Cal: 11 mg/dL (ref 5–40)

## 2021-11-02 LAB — TSH: TSH: 1.77 u[IU]/mL (ref 0.450–4.500)

## 2021-11-02 LAB — VITAMIN D 25 HYDROXY (VIT D DEFICIENCY, FRACTURES): Vit D, 25-Hydroxy: 39.9 ng/mL (ref 30.0–100.0)

## 2021-11-02 LAB — HEMOGLOBIN A1C
Est. average glucose Bld gHb Est-mCnc: 131 mg/dL
Hgb A1c MFr Bld: 6.2 % — ABNORMAL HIGH (ref 4.8–5.6)

## 2021-11-02 LAB — MAGNESIUM: Magnesium: 2 mg/dL (ref 1.6–2.3)

## 2021-11-02 NOTE — Progress Notes (Signed)
Contacted via [place] morning Lateshia, your labs have returned: - A1c remains in prediabetic level and has trended up a little -- now at 6.2%.  We do not want this trending up to 6.5% or more, as this would mean diabetes.  Monitor diet closely and ensure 30 minutes of exercise 5 days a week. - Cholesterol levels stable, continue Atorvastatin. - Remainder of labs are all stable.  Any questions? Keep being amazing!!  Thank you for allowing me to participate in your care.  I appreciate you. Kindest regards, Raeleen Winstanley

## 2022-02-02 ENCOUNTER — Other Ambulatory Visit: Payer: Self-pay | Admitting: Dermatology

## 2022-02-02 DIAGNOSIS — L309 Dermatitis, unspecified: Secondary | ICD-10-CM

## 2022-02-04 ENCOUNTER — Other Ambulatory Visit: Payer: Self-pay

## 2022-02-04 MED ORDER — ALBUTEROL SULFATE HFA 108 (90 BASE) MCG/ACT IN AERS: INHALATION_SPRAY | RESPIRATORY_TRACT | 3 refills | Status: DC

## 2022-02-04 NOTE — Telephone Encounter (Signed)
Medication refill for Albuterol last ov 11/01/21, upcoming ov 05/06/22 . Please advise

## 2022-02-05 ENCOUNTER — Other Ambulatory Visit: Payer: Self-pay | Admitting: Nurse Practitioner

## 2022-02-05 MED ORDER — ALBUTEROL SULFATE HFA 108 (90 BASE) MCG/ACT IN AERS: 2.0000 | INHALATION_SPRAY | Freq: Four times a day (QID) | RESPIRATORY_TRACT | 4 refills | Status: DC | PRN

## 2022-02-19 ENCOUNTER — Other Ambulatory Visit: Payer: Self-pay | Admitting: Nurse Practitioner

## 2022-02-19 MED ORDER — ALBUTEROL SULFATE HFA 108 (90 BASE) MCG/ACT IN AERS: 2.0000 | INHALATION_SPRAY | Freq: Four times a day (QID) | RESPIRATORY_TRACT | 4 refills | Status: DC | PRN

## 2022-02-25 ENCOUNTER — Telehealth: Payer: Self-pay

## 2022-02-25 ENCOUNTER — Encounter: Payer: Self-pay | Admitting: Nurse Practitioner

## 2022-02-25 MED ORDER — ALBUTEROL SULFATE HFA 108 (90 BASE) MCG/ACT IN AERS: 2.0000 | INHALATION_SPRAY | Freq: Four times a day (QID) | RESPIRATORY_TRACT | 2 refills | Status: DC | PRN

## 2022-02-25 NOTE — Telephone Encounter (Signed)
Paperwork faxed back to [facility]

## 2022-02-25 NOTE — Addendum Note (Signed)
Addended by: Marnee Guarneri T on: 02/25/2022 08:08 AM   Modules accepted: Orders

## 2022-03-31 ENCOUNTER — Encounter: Payer: Self-pay | Admitting: Nurse Practitioner

## 2022-04-02 ENCOUNTER — Other Ambulatory Visit: Payer: Self-pay | Admitting: *Deleted

## 2022-04-02 DIAGNOSIS — D472 Monoclonal gammopathy: Secondary | ICD-10-CM

## 2022-04-02 DIAGNOSIS — Z171 Estrogen receptor negative status [ER-]: Secondary | ICD-10-CM

## 2022-04-03 ENCOUNTER — Inpatient Hospital Stay: Payer: Managed Care, Other (non HMO) | Attending: Internal Medicine

## 2022-04-03 ENCOUNTER — Encounter: Payer: Self-pay | Admitting: Internal Medicine

## 2022-04-03 ENCOUNTER — Inpatient Hospital Stay: Payer: Managed Care, Other (non HMO) | Admitting: Internal Medicine

## 2022-04-03 VITALS — BP 144/78 | HR 91 | Temp 96.1°F | Resp 16 | Wt 198.6 lb

## 2022-04-03 DIAGNOSIS — Z853 Personal history of malignant neoplasm of breast: Secondary | ICD-10-CM | POA: Insufficient documentation

## 2022-04-03 DIAGNOSIS — D472 Monoclonal gammopathy: Secondary | ICD-10-CM

## 2022-04-03 DIAGNOSIS — Z08 Encounter for follow-up examination after completed treatment for malignant neoplasm: Secondary | ICD-10-CM | POA: Insufficient documentation

## 2022-04-03 DIAGNOSIS — E669 Obesity, unspecified: Secondary | ICD-10-CM | POA: Insufficient documentation

## 2022-04-03 DIAGNOSIS — G620 Drug-induced polyneuropathy: Secondary | ICD-10-CM | POA: Diagnosis not present

## 2022-04-03 DIAGNOSIS — M8580 Other specified disorders of bone density and structure, unspecified site: Secondary | ICD-10-CM | POA: Insufficient documentation

## 2022-04-03 DIAGNOSIS — Z171 Estrogen receptor negative status [ER-]: Secondary | ICD-10-CM

## 2022-04-03 DIAGNOSIS — D649 Anemia, unspecified: Secondary | ICD-10-CM | POA: Insufficient documentation

## 2022-04-03 LAB — CBC WITH DIFFERENTIAL/PLATELET
Abs Immature Granulocytes: 0.02 10*3/uL (ref 0.00–0.07)
Basophils Absolute: 0.1 10*3/uL (ref 0.0–0.1)
Basophils Relative: 1 %
Eosinophils Absolute: 0.2 10*3/uL (ref 0.0–0.5)
Eosinophils Relative: 3 %
HCT: 29.9 % — ABNORMAL LOW (ref 36.0–46.0)
Hemoglobin: 9.5 g/dL — ABNORMAL LOW (ref 12.0–15.0)
Immature Granulocytes: 0 %
Lymphocytes Relative: 28 %
Lymphs Abs: 2.4 10*3/uL (ref 0.7–4.0)
MCH: 31.7 pg (ref 26.0–34.0)
MCHC: 31.8 g/dL (ref 30.0–36.0)
MCV: 99.7 fL (ref 80.0–100.0)
Monocytes Absolute: 0.6 10*3/uL (ref 0.1–1.0)
Monocytes Relative: 7 %
Neutro Abs: 5.1 10*3/uL (ref 1.7–7.7)
Neutrophils Relative %: 61 %
Platelets: 258 10*3/uL (ref 150–400)
RBC: 3 MIL/uL — ABNORMAL LOW (ref 3.87–5.11)
RDW: 15.5 % (ref 11.5–15.5)
WBC: 8.5 10*3/uL (ref 4.0–10.5)
nRBC: 0 % (ref 0.0–0.2)

## 2022-04-03 LAB — IRON AND TIBC
Iron: 40 ug/dL (ref 28–170)
Saturation Ratios: 13 % (ref 10.4–31.8)
TIBC: 309 ug/dL (ref 250–450)
UIBC: 269 ug/dL

## 2022-04-03 LAB — COMPREHENSIVE METABOLIC PANEL
ALT: 19 U/L (ref 0–44)
AST: 23 U/L (ref 15–41)
Albumin: 4 g/dL (ref 3.5–5.0)
Alkaline Phosphatase: 69 U/L (ref 38–126)
Anion gap: 9 (ref 5–15)
BUN: 24 mg/dL — ABNORMAL HIGH (ref 8–23)
CO2: 25 mmol/L (ref 22–32)
Calcium: 9.3 mg/dL (ref 8.9–10.3)
Chloride: 104 mmol/L (ref 98–111)
Creatinine, Ser: 0.96 mg/dL (ref 0.44–1.00)
GFR, Estimated: >60 mL/min (ref >60)
Glucose, Bld: 148 mg/dL — ABNORMAL HIGH (ref 70–99)
Potassium: 3.4 mmol/L — ABNORMAL LOW (ref 3.5–5.1)
Sodium: 138 mmol/L (ref 135–145)
Total Bilirubin: 0.6 mg/dL (ref 0.3–1.2)
Total Protein: 8.3 g/dL — ABNORMAL HIGH (ref 6.5–8.1)

## 2022-04-03 LAB — RETICULOCYTES
Immature Retic Fract: 16.1 % — ABNORMAL HIGH (ref 2.3–15.9)
RBC.: 2.99 MIL/uL — ABNORMAL LOW (ref 3.87–5.11)
Retic Count, Absolute: 46.6 10*3/uL (ref 19.0–186.0)
Retic Ct Pct: 1.6 % (ref 0.4–3.1)

## 2022-04-03 LAB — LACTATE DEHYDROGENASE: LDH: 149 U/L (ref 98–192)

## 2022-04-03 LAB — FERRITIN: Ferritin: 217 ng/mL (ref 11–307)

## 2022-04-03 MED ORDER — POTASSIUM CHLORIDE CRYS ER 20 MEQ PO TBCR: EXTENDED_RELEASE_TABLET | ORAL | 5 refills | Status: DC

## 2022-04-03 NOTE — Assessment & Plan Note (Addendum)
# [  2018- 1gm/dl]MGUS- July 2023-M protein- 1.2; Kappa/lamda light chain= 2.8 [K=33].  Overall stable except for mild anemia see below.  No evidence of renal function or hypercalcemia. MM Labs pending today.  # chronic mild anemia-unclear etiology [BL 10-11]; However today Hb 9.5- worsened; recheck ironstudies/ferritin.  Check labs again in 4 months.  It is clinically unlikely the patient anemia is related to her underlying monoclonal gammopathy.  # Left leg cramps- [months]- waking up from night. K 3.4/Mag JULY 2023- 2.0. ok to start mustard [per PCP]; add Potassium supplementation Kdur 20 meq; and dietary supplements.   # 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  Clinically no evidence of recurrence; OCT 2022 normal-stable complicated cyst April [id number] benign:  Will check with breast center re: imaging recommendations   # Peripheral neuropathy- sec to chemo. grade 1-2;stable.   #Osteopenia : Reviewed the bone density  [April 2022-]T-score of -1.3; continue VitD. [hx of falls]; recommend resistance training [pt currently on @ home/videos]  # Obesity- recommend continued weight loss efforts  # DISPOSITION:  # follow up in 4 months-MD /labs- cbc/cmp; Q22; folic acid; WLN-/LG-92-11/HE panel;  K-L panel; vit D -OH levels;Dr.B

## 2022-04-03 NOTE — Progress Notes (Signed)
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   # 2012 LEFT BREAST CA STAGE I s/p Lumpec [T1 (0.7CM);Triple Neg; G-3]; s/p mammosite RT; No adj chemo  #  Mild Anemia/M-protein 0.8gm/dl [sep 2016]  # BRCA- 1&2 NEGATIVE [july 2016]; s/p Genetic counseling [June 2020]  # PN s/p acupuncture  --------------------------------------------------     DIAGNOSIS: breast cancer  STAGE:   I      ;GOALS: cure  CURRENT/MOST RECENT THERAPY: surveillaince     History of breast cancer  08/31/2018 Genetic Testing   Two VUS identified on the CustomNext-Cancer+RNAinsight.  CFTR p.R450I and CTNNA1 p.L402S VUS.  The CustomNext-Expanded gene panel offered by [facility] and includes sequencing and rearrangement analysis for the following 81 genes: AIP, ALK, APC*, ATM*, AXIN2, BAP1, BARD1, BLM, BMPR1A, BRCA1*, BRCA2*, BRIP1*, CDC73, CDH1*, CDK4, CDKN1B, CDKN2A, CHEK2*, CTNNA1, DICER1, FANCC, FH, FLCN, GALNT12, HOXB13, KIT, MAX, MEN1, MET, MLH1*, MRE11A, MSH2*, MSH6*, MUTYH*, NBN, NF1*, NF2, NTHL1, PALB2*, PDGFRA, PHOX2B, PMS2*, POLD1, POLE, POT1, PRKAR1A, PTCH1, PTEN*, RAD50, RAD51C*, RAD51D*, RB1, RET, SDHA, SDHAF2, SDHB, SDHC, SDHD, SMAD4, SMARCA4, SMARCB1, SMARCE1, STK11, SUFU, TMEM127, TP53*, TSC1, TSC2, VHL and XRCC2 (sequencing and deletion/duplication); CASR, CFTR, CPA1, CTRC, EGFR, MITF, PRSS1 and SPINK1 (sequencing only); EPCAM and GREM1 (deletion/duplication only). DNA and RNA analyses performed for * genes. The report date is September 01, 2018.     INTERVAL HISTORY: Alone. Ambulating independently.  A pleasant 62 year old female patient with above history of stage I right-sided triple negative breast cancer; MGUS- Is here for follow-up.   Patient complains of cramping in the legs mostly on the left compared to right.  Mostly at nighttime.  Not with ambulation.  Denies any swelling.  Chronic tingling and numbness not any worse.  Appetite is good.  No weight loss.  Denies any new lumps or bumps.   No worsening back pain.  Review of Systems  Constitutional:  Negative for chills, diaphoresis, fever, malaise/fatigue and weight loss.  HENT:  Negative for nosebleeds and sore throat.   Eyes:  Negative for double vision.  Respiratory:  Negative for cough, hemoptysis, sputum production, shortness of breath and wheezing.   Cardiovascular:  Negative for chest pain, palpitations, orthopnea and leg swelling.  Gastrointestinal:  Negative for abdominal pain, blood in stool, constipation, diarrhea, heartburn, melena, nausea and vomiting.  Genitourinary:  Negative for dysuria, frequency and urgency.  Musculoskeletal:  Positive for joint pain. Negative for back pain.  Skin: Negative.  Negative for itching and rash.  Neurological:  Positive for tingling. Negative for dizziness, focal weakness, weakness and headaches.  Endo/Heme/Allergies:  Does not bruise/bleed easily.  Psychiatric/Behavioral:  Negative for depression. The patient is not nervous/anxious and does not have insomnia.      PAST MEDICAL HISTORY :  Past Medical History:  Diagnosis Date   Anemia    Asthma    BRCA negative 09/22/2014   Breast cancer ([place]) 2012   LT LUMPECTOMY   Breast cancer ([place]) 2016   RT LUMPECTOMY   Breast screening, unspecified    Constipation  Dry eyes 2012   Eczema    Family history of breast cancer    Family history of ovarian cancer    Family history of prostate cancer    GERD (gastroesophageal reflux disease)    Hematuria    Kidney  stone    Lump or mass in breast    Malignant neoplasm of breast (female), unspecified site 07/08/2014   Right breast, 5 mm, T1a,N0, triple negative; high risk for recurrence on Mammoprint.   Malignant neoplasm of upper-outer quadrant of female breast ([place]) 05/08/2010   Left breast: T1b, N0, M); triple negative, DCIS present. No chemotherapy. MammoSite radiation.   Neuropathy    pt states in fingertips and feet   Obesity, unspecified    Pancreatic lesion    Personal history of chemotherapy 2016   BREAST CA- Right   Personal history of malignant neoplasm of breast 2012   has been treated with left breast wide excision and radiation therapy; Patient has DCIS as well as a 7 mm infiltrating mammary carcinoma triple negative removed on 05-08-10   Personal history of radiation therapy 2012   BREAST CA - MAMMOSITE- Left   Personal history of radiation therapy 2016   BREAST CA - MAMMOSITE- Right   Screening for obesity    Seasonal allergies    Special screening for malignant neoplasms, colon    Unspecified essential hypertension     PAST SURGICAL HISTORY :   Past Surgical History:  Procedure Laterality Date   BREAST BIOPSY Right 2016   Invasive   BREAST CYST ASPIRATION Left 2010   BREAST LUMPECTOMY Left 2012   BREAST CA   BREAST LUMPECTOMY Right 2016   INVASIVE MAMMARY CARCINOMA   BREAST LUMPECTOMY WITH AXILLARY LYMPH NODE BIOPSY Right 08/01/2014   Procedure: BREAST LUMPECTOMY WITH AXILLARY LYMPH NODE BIOPSY;  Surgeon: Robert Bellow, MD;  Location: [facility] ORS;  Service: General;  Laterality: Right;   BREAST MAMMOSITE  April 2012   mammosite placement and removal    BREAST SURGERY Left 2012   left breast wide excision, sentinel node, MammoSite   BREAST SURGERY Right 08/06/2014   Wide excision/sentinel node biopsy, T1a, N0 triple negative, MammoSite   CESAREAN SECTION  1988   COLONOSCOPY  October 17, 2009   Dr. Dionne Milo, normal study.   KIDNEY STONE SURGERY  2003   stones removed by  Dr. Quillian Quince- stent placed/removed   PORTACATH PLACEMENT Left 11/09/2014   Procedure: INSERTION PORT-A-CATH;  Surgeon: Robert Bellow, MD;  Location: [facility] ORS;  Service: General;  Laterality: Left;   SENTINEL NODE BIOPSY Right 08/01/2014   Procedure: SENTINEL NODE BIOPSY;  Surgeon: Robert Bellow, MD;  Location: [facility] ORS;  Service: General;  Laterality: Right;   TUBAL LIGATION      FAMILY HISTORY :   Family History  Problem Relation Age of Onset   Ovarian cancer Mother    Diabetes Mother    Diabetes Father    Prostate cancer Father        dx in his 15s   Hypertension Brother    Cancer Maternal Grandmother        bone   Cancer Maternal Grandfather        lung   Cancer Paternal Grandmother        lung   Asthma Paternal Grandfather    Hypertension Sister    Prostate cancer Brother 20   AAA (abdominal aortic aneurysm) Maternal Uncle    Breast cancer Other     SOCIAL  HISTORY:   Social History   Tobacco Use   Smoking status: Never   Smokeless tobacco: Never  Vaping Use   Vaping Use: Never used  Substance Use Topics   Alcohol use: Yes    Comment: occasionally   Drug use: No    ALLERGIES:  is allergic to cayenne pepper [cayenne], shellfish allergy, and tape.  MEDICATIONS:  Current Outpatient Medications  Medication Sig Dispense Refill   albuterol (PROVENTIL HFA) 108 (90 Base) MCG/ACT inhaler Inhale 2 puffs into the lungs every 6 (six) hours as needed for wheezing or shortness of breath. 18 g 2   aspirin 81 MG tablet Take 81 mg by mouth daily.     atorvastatin (LIPITOR) 10 MG tablet Take 1 tablet (10 mg total) by mouth daily. 90 tablet 4   bisacodyl (DULCOLAX) 5 MG EC tablet Take 5 mg by mouth daily as needed for moderate constipation.     Cholecalciferol (VITAMIN D3) 1000 UNITS CAPS Take 1 capsule by mouth daily.      clobetasol (TEMOVATE) 0.05 % external solution Apply 1 application topically daily. Mix bottle in 1 tub of cerave cream and use qd to trunk, arms and  legs, avoid face, groin, axilla 50 mL 1   DUPIXENT 300 MG/2ML SOPN INJECT '300MG'$  SUBCUTANEOUSLY  EVERY OTHER WEEK 4 mL 6   esomeprazole (NEXIUM) 40 MG capsule Take 40 mg by mouth 2 (two) times daily as needed (acid reflux).      lisinopril (ZESTRIL) 10 MG tablet Take 1 tablet (10 mg total) by mouth daily. 90 tablet 4   loratadine (CLARITIN) 10 MG tablet Take 10 mg by mouth daily as needed. Reported on 04/13/2015     polyethylene glycol (MIRALAX / GLYCOLAX) packet Take 17 g by mouth every other day.     potassium chloride SA (KLOR-CON M) 20 MEQ tablet 1 pill a day. 30 tablet 5   Turmeric 500 MG CAPS Take 500 mg by mouth daily.     No current facility-administered medications for this visit.   Facility-Administered Medications Ordered in Other Visits  Medication Dose Route Frequency Provider Last Rate Last Admin   sodium chloride 0.9 % injection 10 mL  10 mL Intravenous PRN Leia Alf, MD   10 mL at 11/10/14 1138    PHYSICAL EXAMINATION: ECOG PERFORMANCE STATUS: 1 - Symptomatic but completely ambulatory  BP (!) 144/78 (BP Location: Left Arm, Patient Position: Sitting)   Pulse 91   Temp (!) 96.1 F (35.6 C) (Tympanic)   Resp 16   Wt 198 lb 9.6 oz (90.1 kg)   LMP  (LMP Unknown)   BMI 33.05 kg/m   Filed Weights   04/03/22 1100  Weight: 198 lb 9.6 oz (90.1 kg)    Physical Exam HENT:     Head: Normocephalic and atraumatic.     Mouth/Throat:     Pharynx: No oropharyngeal exudate.  Eyes:     Pupils: Pupils are equal, round, and reactive to light.  Cardiovascular:     Rate and Rhythm: Normal rate and regular rhythm.  Pulmonary:     Effort: No respiratory distress.     Breath sounds: No wheezing.  Abdominal:     General: Bowel sounds are normal. There is no distension.     Palpations: Abdomen is soft. There is no mass.     Tenderness: There is no abdominal tenderness. There is no guarding or rebound.  Musculoskeletal:        General: No tenderness. Normal range of  motion.      Cervical back: Normal range of motion and neck supple.  Skin:    General: Skin is warm.  Neurological:     Mental Status: She is alert and oriented to person, place, and time.  Psychiatric:        Mood and Affect: Affect normal.      LABORATORY DATA:  I have reviewed the data as listed    Component Value Date/Time   NA 138 04/03/2022 1008   NA 142 04/01/2021 1027   NA 140 07/16/2011 2130   K 3.4 (L) 04/03/2022 1008   K 3.7 07/16/2011 2130   CL 104 04/03/2022 1008   CL 106 07/16/2011 2130   CO2 25 04/03/2022 1008   CO2 27 07/16/2011 2130   GLUCOSE 148 (H) 04/03/2022 1008   GLUCOSE 93 07/16/2011 2130   BUN 24 (H) 04/03/2022 1008   BUN 16 04/01/2021 1027   BUN 16 07/16/2011 2130   CREATININE 0.96 04/03/2022 1008   CREATININE 0.95 07/16/2011 2130   CALCIUM 9.3 04/03/2022 1008   CALCIUM 9.0 07/16/2011 2130   PROT 8.3 (H) 04/03/2022 1008   PROT 7.1 10/01/2020 0902   ALBUMIN 4.0 04/03/2022 1008   ALBUMIN 4.2 10/01/2020 0902   AST 23 04/03/2022 1008   ALT 19 04/03/2022 1008   ALKPHOS 69 04/03/2022 1008   BILITOT 0.6 04/03/2022 1008   BILITOT 0.3 10/01/2020 0902   GFRNONAA >60 04/03/2022 1008   GFRNONAA >60 07/16/2011 2130   GFRAA 80 04/16/2020 1159   GFRAA >60 07/16/2011 2130    No results found for: "SPEP", "UPEP"  Lab Results  Component Value Date   WBC 8.5 04/03/2022   NEUTROABS 5.1 04/03/2022   HGB 9.5 (L) 04/03/2022   HCT 29.9 (L) 04/03/2022   MCV 99.7 04/03/2022   PLT 258 04/03/2022      Chemistry      Component Value Date/Time   NA 138 04/03/2022 1008   NA 142 04/01/2021 1027   NA 140 07/16/2011 2130   K 3.4 (L) 04/03/2022 1008   K 3.7 07/16/2011 2130   CL 104 04/03/2022 1008   CL 106 07/16/2011 2130   CO2 25 04/03/2022 1008   CO2 27 07/16/2011 2130   BUN 24 (H) 04/03/2022 1008   BUN 16 04/01/2021 1027   BUN 16 07/16/2011 2130   CREATININE 0.96 04/03/2022 1008   CREATININE 0.95 07/16/2011 2130      Component Value Date/Time   CALCIUM 9.3  04/03/2022 1008   CALCIUM 9.0 07/16/2011 2130   ALKPHOS 69 04/03/2022 1008   AST 23 04/03/2022 1008   ALT 19 04/03/2022 1008   BILITOT 0.6 04/03/2022 1008   BILITOT 0.3 10/01/2020 0902         ASSESSMENT & PLAN:   MGUS (monoclonal gammopathy of unknown significance) # [6378- 1gm/dl]MGUS- July 2023-M protein- 1.2; Kappa/lamda light chain= 2.8 [K=33].  Overall stable except for mild anemia see below.  No evidence of renal function or hypercalcemia. MM Labs pending today.  # chronic mild anemia-unclear etiology [BL 10-11]; However today Hb 9.5- worsened; recheck ironstudies/ferritin.  Check labs again in 4 months.  It is clinically unlikely the patient anemia is related to her underlying monoclonal gammopathy.  # Left leg cramps- [months]- waking up from night. K 3.4/Mag JULY 2023- 2.0. ok to start mustard [per PCP]; add Potassium supplementation Kdur 20 meq; and dietary supplements.   # 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   #Osteopenia : Reviewed the bone density  [April 2022-]T-score of -1.3; continue VitD. [hx of falls]; recommend resistance training [pt currently on @ home/videos]  # Obesity- recommend continued weight loss efforts  # DISPOSITION:  # follow up in 4 months-MD /labs- cbc/cmp; C00; folic acid; LKJ-/ZP-91-50/VW panel;  K-L panel; vit D -OH levels;Dr.B           Cammie Sickle, MD 04/03/2022 12:29 PM

## 2022-04-03 NOTE — Progress Notes (Signed)
Patient denies new problems/concerns today.   °

## 2022-04-04 ENCOUNTER — Encounter: Payer: Self-pay | Admitting: Internal Medicine

## 2022-04-04 DIAGNOSIS — D472 Monoclonal gammopathy: Secondary | ICD-10-CM

## 2022-04-04 LAB — KAPPA/LAMBDA LIGHT CHAINS
Kappa free light chain: 31.4 mg/L — ABNORMAL HIGH (ref 3.3–19.4)
Kappa, lambda light chain ratio: 2.15 — ABNORMAL HIGH (ref 0.26–1.65)
Lambda free light chains: 14.6 mg/L (ref 5.7–26.3)

## 2022-04-04 LAB — CANCER ANTIGEN 27.29: CA 27.29: <9 U/mL (ref 0.0–38.6)

## 2022-04-07 LAB — MULTIPLE MYELOMA PANEL, SERUM
Albumin SerPl Elph-Mcnc: 3.9 g/dL (ref 2.9–4.4)
Albumin/Glob SerPl: 1.2 (ref 0.7–1.7)
Alpha 1: 0.2 g/dL (ref 0.0–0.4)
Alpha2 Glob SerPl Elph-Mcnc: 0.7 g/dL (ref 0.4–1.0)
B-Globulin SerPl Elph-Mcnc: 0.8 g/dL (ref 0.7–1.3)
Gamma Glob SerPl Elph-Mcnc: 1.7 g/dL (ref 0.4–1.8)
Globulin, Total: 3.5 g/dL (ref 2.2–3.9)
IgA: 134 mg/dL (ref 87–352)
IgG (Immunoglobin G), Serum: 1914 mg/dL — ABNORMAL HIGH (ref 586–1602)
IgM (Immunoglobulin M), Srm: 44 mg/dL (ref 26–217)
M Protein SerPl Elph-Mcnc: 1.1 g/dL — ABNORMAL HIGH
Total Protein ELP: 7.4 g/dL (ref 6.0–8.5)

## 2022-04-17 ENCOUNTER — Inpatient Hospital Stay: Payer: Managed Care, Other (non HMO) | Attending: Internal Medicine

## 2022-04-17 ENCOUNTER — Telehealth: Payer: Self-pay | Admitting: *Deleted

## 2022-04-17 DIAGNOSIS — Z853 Personal history of malignant neoplasm of breast: Secondary | ICD-10-CM | POA: Diagnosis present

## 2022-04-17 DIAGNOSIS — Z08 Encounter for follow-up examination after completed treatment for malignant neoplasm: Secondary | ICD-10-CM | POA: Diagnosis present

## 2022-04-17 DIAGNOSIS — D472 Monoclonal gammopathy: Secondary | ICD-10-CM | POA: Diagnosis present

## 2022-04-17 LAB — BASIC METABOLIC PANEL
Anion gap: 8 (ref 5–15)
BUN: 33 mg/dL — ABNORMAL HIGH (ref 8–23)
CO2: 25 mmol/L (ref 22–32)
Calcium: 9.4 mg/dL (ref 8.9–10.3)
Chloride: 104 mmol/L (ref 98–111)
Creatinine, Ser: 1.26 mg/dL — ABNORMAL HIGH (ref 0.44–1.00)
GFR, Estimated: 48 mL/min — ABNORMAL LOW (ref >60)
Glucose, Bld: 90 mg/dL (ref 70–99)
Potassium: 4.5 mmol/L (ref 3.5–5.1)
Sodium: 137 mmol/L (ref 135–145)

## 2022-04-17 NOTE — Telephone Encounter (Signed)
RN called patient to inform her that her potassium level was much better today 4.5 normal. Her BUN/creatinine are slightly elevated Instructed pt to drink more water daily increase intake by 32 oz daily. Pt thankful for information.

## 2022-04-28 ENCOUNTER — Other Ambulatory Visit: Payer: Self-pay | Admitting: Nurse Practitioner

## 2022-04-29 NOTE — Telephone Encounter (Signed)
Requested by interface surescripts. Medication discontinued 02/25/22. Not covered by pt insurance.  Requested Prescriptions  Refused Prescriptions Disp Refills   albuterol (VENTOLIN HFA) 108 (90 Base) MCG/ACT inhaler [Pharmacy Med Name: ALBUTEROL HFA 90MCG/ACT (PV)] 26.8 g 3    Sig: USE 2 INHALATIONS BY MOUTH EVERY 6 HOURS AS NEEDED FOR WHEEZING  OR SHORTNESS OF BREATH     Pulmonology:  Beta Agonists 2 Failed - 04/28/2022  6:05 AM      Failed - Last BP in normal range    BP Readings from Last 1 Encounters:  04/03/22 (!) 144/78         Passed - Last Heart Rate in normal range    Pulse Readings from Last 1 Encounters:  04/03/22 91         Passed - Valid encounter within last 12 months    Recent Outpatient Visits           5 months ago Peripheral neuropathy due to chemotherapy [facility])   [place], Lovelock T, NP   1 year ago Peripheral neuropathy due to chemotherapy [facility])   [place], Henrine Screws T, NP   1 year ago Class 2 severe obesity due to excess calories with serious comorbidity and body mass index (BMI) of 35.0 to 35.9 in adult [facility])   [place], Henrine Screws T, NP   1 year ago Chronic right shoulder pain   [place], Henrine Screws T, NP   1 year ago Chronic right shoulder pain   [place], Barbaraann Faster, NP       Future Appointments             In 6 days Brendolyn Patty, MD [place]   In 1 week [place], Barbaraann Faster, NP [place]

## 2022-05-03 NOTE — Patient Instructions (Signed)

## 2022-05-05 ENCOUNTER — Ambulatory Visit (INDEPENDENT_AMBULATORY_CARE_PROVIDER_SITE_OTHER): Payer: Managed Care, Other (non HMO) | Admitting: Dermatology

## 2022-05-05 VITALS — BP 132/81 | HR 79

## 2022-05-05 DIAGNOSIS — Z79899 Other long term (current) drug therapy: Secondary | ICD-10-CM

## 2022-05-05 DIAGNOSIS — L2081 Atopic neurodermatitis: Secondary | ICD-10-CM | POA: Diagnosis not present

## 2022-05-05 MED ORDER — DUPIXENT 300 MG/2ML ~~LOC~~ SOAJ: 300.0000 mg | SUBCUTANEOUS | 6 refills | Status: DC

## 2022-05-05 NOTE — Progress Notes (Signed)
   Follow-Up Visit   Subjective  Edit Adrienne [name] is a 62 y.o. female who presents for the following: Eczema (Trunk, extremities, 8mf/u, Dupixent sq injections q 2 wks, Clobetasol/cerave mix qd, not using Tacrolimus). No side effects from Dupixent, no eye symptoms.   The following portions of the chart were reviewed this encounter and updated as appropriate:       Review of Systems:  No other skin or systemic complaints except as noted in HPI or Assessment and Plan.  Objective  Well appearing patient in no apparent distress; mood and affect are within normal limits.  A focused examination was performed including buttocks, arms. Relevant physical exam findings are noted in the Assessment and Plan.  trunk, extremities dyspigmented patch R medial buttocks, Adrienne upper arm Mild xerosis arms, buttocks    Assessment & Plan  Atopic neurodermatitis trunk, extremities  Chronic condition with duration or expected duration over one year. Currently well-controlled. Patient started Dupixent 06/17/2020  Atopic dermatitis - Severe, on Dupixent (biologic medication).  Atopic dermatitis (eczema) is a chronic, relapsing, pruritic condition that can significantly affect quality of life. It is often associated with allergic rhinitis and/or asthma and can require treatment with topical medications, phototherapy, or in severe cases biologic medications, which require long term medication management.     Cont Dupixent 3063m2ml sq injections q 2 wks Cont Clobetasol/cerave mix qd/bid to areas of eczema/itching prn flares (spot treat), avoid f/g/a Start Cerave cream daily to body after shower  Dupilumab (Dupixent) is a treatment given by injection for adults and children with moderate-to-severe atopic dermatitis. Goal is control of skin condition, not cure. It is given as 2 injections at the first dose followed by 1 injection ever 2 weeks thereafter.  Young children are dosed monthly.  Potential side  effects include allergic reaction, herpes infections, injection site reactions and conjunctivitis (inflammation of the eyes).  The use of Dupixent requires long term medication management, including periodic office visits.   Topical steroids (such as triamcinolone, fluocinolone, fluocinonide, mometasone, clobetasol, halobetasol, betamethasone, hydrocortisone) can cause thinning and lightening of the skin if they are used for too long in the same area. Your physician has selected the right strength medicine for your problem and area affected on the body. Please use your medication only as directed by your physician to prevent side effects.    Dupilumab (DUPIXENT) 300 MG/2ML SOPN - trunk, extremities Inject 300 mg into the skin every 14 (fourteen) days. Starting at day 15 for maintenance.   Return in about 6 months (around 11/03/2022) for Atopic Derm/Dupixent.  I, SoOthelia PullingRMA, am acting as scribe for TaBrendolyn PattyMD .  Documentation: I have reviewed the above documentation for accuracy and completeness, and I agree with the above.  TaBrendolyn PattyD

## 2022-05-05 NOTE — Patient Instructions (Addendum)
For Clobetasol/Cerave mix only use on areas of eczema as needed for flares, not to face, groin, underarms Start Cerave cream (just Cerave) to body after shower for moisturizer  Topical steroids (such as triamcinolone, fluocinolone, fluocinonide, mometasone, clobetasol, halobetasol, betamethasone, hydrocortisone) can cause thinning and lightening of the skin if they are used for too long in the same area. Your physician has selected the right strength medicine for your problem and area affected on the body. Please use your medication only as directed by your physician to prevent side effects.    Gentle Skin Care Guide  1. Bathe no more than once a day.  2. Avoid bathing in hot water  3. Use a mild soap like Dove, Vanicream, Cetaphil, CeraVe. Can use Lever 2000 or Cetaphil antibacterial soap  4. Use soap only where you need it. On most days, use it under your arms, between your legs, and on your feet. Let the water rinse other areas unless visibly dirty.  5. When you get out of the bath/shower, use a towel to gently blot your skin dry, don't rub it.  6. While your skin is still a little damp, apply a moisturizing cream such as Vanicream, CeraVe, Cetaphil, Eucerin, Sarna lotion or plain Vaseline Jelly. For hands apply Neutrogena [place] Hand Cream or Excipial Hand Cream.  7. Reapply moisturizer any time you start to itch or feel dry.  8. Sometimes using free and clear laundry detergents can be helpful. Fabric softener sheets should be avoided. Downy Free & Gentle liquid, or any liquid fabric softener that is free of dyes and perfumes, it acceptable to use  9. If your doctor has given you prescription creams you may apply moisturizers over them      Due to recent changes in healthcare laws, you may see results of your pathology and/or laboratory studies on MyChart before the doctors have had a chance to review them. We understand that in some cases there may be results that are confusing or  concerning to you. Please understand that not all results are received at the same time and often the doctors may need to interpret multiple results in order to provide you with the best plan of care or course of treatment. Therefore, we ask that you please give [place] 2 business days to thoroughly review all your results before contacting the office for clarification. Should we see a critical lab result, you will be contacted sooner.   If You Need Anything After Your Visit  If you have any questions or concerns for your doctor, please call our main line at [phone] and press option 4 to reach your doctor's medical assistant. If no one answers, please leave a voicemail as directed and we will return your call as soon as possible. Messages left after 4 pm will be answered the following business day.   You may also send [place] a message via [place]. We typically respond to MyChart messages within 1-2 business days.  For prescription refills, please ask your pharmacy to contact our office. Our fax number is [phone].  If you have an urgent issue when the clinic is closed that cannot wait until the next business day, you can page your doctor at the number below.    Please note that while we do our best to be available for urgent issues outside of office hours, we are not available 24/7.   If you have an urgent issue and are unable to reach [place], you may choose to seek medical care  at your doctor's office, retail clinic, urgent care center, or emergency room.  If you have a medical emergency, please immediately call 911 or go to the emergency department.  Pager Numbers  - Dr. Nehemiah Massed: [phone]  - Dr. Laurence Ferrari: [phone]  - Dr. Nicole Kindred: [phone]  In the event of inclement weather, please call our main line at [phone] for an update on the status of any delays or closures.  Dermatology Medication Tips: Please keep the boxes that topical medications come in in order to help keep track  of the instructions about where and how to use these. Pharmacies typically print the medication instructions only on the boxes and not directly on the medication tubes.   If your medication is too expensive, please contact our office at [phone] option 4 or send [place] a message through [place].   We are unable to tell what your co-pay for medications will be in advance as this is different depending on your insurance coverage. However, we may be able to find a substitute medication at lower cost or fill out paperwork to get insurance to cover a needed medication.   If a prior authorization is required to get your medication covered by your insurance company, please allow [place] 1-2 business days to complete this process.  Drug prices often vary depending on where the prescription is filled and some pharmacies may offer cheaper prices.  The website www.goodrx.com contains coupons for medications through different pharmacies. The prices here do not account for what the cost may be with help from insurance (it may be cheaper with your insurance), but the website can give you the price if you did not use any insurance.  - You can print the associated coupon and take it with your prescription to the pharmacy.  - You may also stop by our office during regular business hours and pick up a GoodRx coupon card.  - If you need your prescription sent electronically to a different pharmacy, notify our office through [facility] or by phone at [phone] option 4.     Si Usted Necesita Algo Despus de Su Visita  Tambin puede enviarnos un mensaje a travs de Pharmacist, community. Por lo general respondemos a los mensajes de MyChart en el transcurso de 1 a 2 das hbiles.  Para renovar recetas, por favor pida a su farmacia que se ponga en contacto con nuestra oficina. Harland Dingwall de fax es [place] [phone].  Si tiene un asunto urgente cuando la clnica est cerrada y que no puede esperar hasta el siguiente da  hbil, puede llamar/localizar a su doctor(a) al nmero que aparece a continuacin.   Por favor, tenga en cuenta que aunque hacemos todo lo posible para estar disponibles para asuntos urgentes fuera del horario de [place], no estamos disponibles las 24 horas del da, los 7 das de la [place].   Si tiene un problema urgente y no puede comunicarse con nosotros, puede optar por buscar atencin mdica  en el consultorio de su doctor(a), en una clnica privada, en un centro de atencin urgente o en una sala de emergencias.  Si tiene Engineering geologist, por favor llame inmediatamente al 911 o vaya a la sala de emergencias.  Nmeros de bper  - Dr. Nehemiah Massed: [phone]  - Dra. Moye: [phone]  - Dra. Nicole Kindred: [phone]  En caso de inclemencias del [place], por favor llame a Johnsie Kindred principal al [phone] para una actualizacin sobre el [place] de cualquier retraso o cierre.  Consejos para la medicacin en  dermatologa: Por favor, guarde las cajas en las que vienen los medicamentos de uso tpico para ayudarle a seguir las instrucciones sobre dnde y cmo usarlos. Las farmacias generalmente imprimen las instrucciones del medicamento slo en las cajas y no directamente en los tubos del [place].   Si su medicamento es muy caro, por favor, pngase en contacto con Zigmund Daniel llamando al [phone] y presione la opcin 4 o envenos un mensaje a travs de Pharmacist, community.   No podemos decirle cul ser su copago por los medicamentos por adelantado ya que esto es diferente dependiendo de la cobertura de su seguro. Sin embargo, es posible que podamos encontrar un medicamento sustituto a Electrical engineer un formulario para que el seguro cubra el medicamento que se considera necesario.   Si se requiere una autorizacin previa para que su compaa de seguros [place] su medicamento, por favor permtanos de 1 a 2 das hbiles para completar este proceso.  Los precios de los medicamentos  varan con frecuencia dependiendo del Environmental consultant de dnde se surte la receta y alguna farmacias pueden ofrecer precios ms baratos.  El sitio web www.goodrx.com tiene cupones para medicamentos de Airline pilot. Los precios aqu no tienen en cuenta lo que podra costar con la ayuda del seguro (puede ser ms barato con su seguro), pero el sitio web puede darle el precio si no utiliz Research scientist (physical sciences).  - Puede imprimir el cupn correspondiente y llevarlo con su receta a la farmacia.  - Tambin puede pasar por nuestra oficina durante el horario de atencin regular y Charity fundraiser una tarjeta de cupones de GoodRx.  - Si necesita que su receta se enve electrnicamente a una farmacia diferente, informe a nuestra oficina a travs de MyChart de  o por telfono llamando al [phone] y presione la opcin 4.

## 2022-05-06 ENCOUNTER — Encounter: Payer: Self-pay | Admitting: Nurse Practitioner

## 2022-05-06 ENCOUNTER — Ambulatory Visit: Payer: Managed Care, Other (non HMO) | Admitting: Nurse Practitioner

## 2022-05-06 VITALS — BP 125/84 | HR 80 | Temp 97.5°F | Ht 65.0 in | Wt 197.7 lb

## 2022-05-06 DIAGNOSIS — I10 Essential (primary) hypertension: Secondary | ICD-10-CM

## 2022-05-06 DIAGNOSIS — Z6835 Body mass index (BMI) 35.0-35.9, adult: Secondary | ICD-10-CM

## 2022-05-06 DIAGNOSIS — T451X5A Adverse effect of antineoplastic and immunosuppressive drugs, initial encounter: Secondary | ICD-10-CM

## 2022-05-06 DIAGNOSIS — K219 Gastro-esophageal reflux disease without esophagitis: Secondary | ICD-10-CM

## 2022-05-06 DIAGNOSIS — E782 Mixed hyperlipidemia: Secondary | ICD-10-CM | POA: Diagnosis not present

## 2022-05-06 DIAGNOSIS — M85851 Other specified disorders of bone density and structure, right thigh: Secondary | ICD-10-CM

## 2022-05-06 DIAGNOSIS — D649 Anemia, unspecified: Secondary | ICD-10-CM

## 2022-05-06 DIAGNOSIS — Z853 Personal history of malignant neoplasm of breast: Secondary | ICD-10-CM

## 2022-05-06 DIAGNOSIS — G620 Drug-induced polyneuropathy: Secondary | ICD-10-CM

## 2022-05-06 DIAGNOSIS — R7303 Prediabetes: Secondary | ICD-10-CM

## 2022-05-06 DIAGNOSIS — J4520 Mild intermittent asthma, uncomplicated: Secondary | ICD-10-CM

## 2022-05-06 DIAGNOSIS — D472 Monoclonal gammopathy: Secondary | ICD-10-CM

## 2022-05-06 LAB — MICROALBUMIN, URINE WAIVED
Creatinine, Urine Waived: 100 mg/dL (ref 10–300)
Microalb, Ur Waived: 30 mg/L — ABNORMAL HIGH (ref 0–19)
Microalb/Creat Ratio: <30 mg/g (ref <30)

## 2022-05-06 LAB — BAYER DCA HB A1C WAIVED: HB A1C (BAYER DCA - WAIVED): 6.1 % — ABNORMAL HIGH (ref 4.8–5.6)

## 2022-05-06 NOTE — Assessment & Plan Note (Signed)
Chronic, stable without medication at this time.  Has had side effects with Lyrica and Gabapentin, extreme fatigue.  Check Mag level at physical.

## 2022-05-06 NOTE — Assessment & Plan Note (Signed)
BMI 32.90, ongoing loss, praised for this.  Recommended eating smaller high protein, low fat meals more frequently and exercising 30 mins a day 5 times a week with a goal of 10-15lb weight loss in the next 3 months. Patient voiced their understanding and motivation to adhere to these recommendations.

## 2022-05-06 NOTE — Assessment & Plan Note (Signed)
Chronic, stable.  BP at goal in office and at home.  Continue current medication regimen and adjust as needed.  Recommend she continue to monitor BP regularly at home and document + focus on DASH diet.  LABS: BMP.  Return to office in 6 months.

## 2022-05-06 NOTE — Assessment & Plan Note (Signed)
Continue to collaborate with oncology and review notes.

## 2022-05-06 NOTE — Assessment & Plan Note (Signed)
Chronic, ongoing. Continue current medication regimen and adjust as needed.  Lipid panel today. 

## 2022-05-06 NOTE — Assessment & Plan Note (Signed)
Monitored by oncology--recent labs reviewed.  Continue multivitamin at home.

## 2022-05-06 NOTE — Assessment & Plan Note (Signed)
Noted on DEXA 06/18/20 -- at this time continue supplements daily and educated patient at length on finding.  Plan on repeat DEXA 06/18/2025.  Check Vitamin D at physical.

## 2022-05-06 NOTE — Progress Notes (Signed)
BP 125/84   Pulse 80   Temp (!) 97.5 F (36.4 C) (Oral)   Ht '5\' 5"'$  (1.651 m)   Wt 197 lb 11.2 oz (89.7 kg)   LMP  (LMP Unknown)   BMI 32.90 kg/m    Subjective:    Patient ID: Adrienne [name], female    DOB: 18-Nov-1960, 62 y.o.   MRN: [id number]  HPI: Adrienne [name] is a 62 y.o. female  Chief Complaint  Patient presents with   Hypertension   Hyperlipidemia   Asthma   Gastroesophageal Reflux   MGUS: Last visit with Dr. Rogue Bussing 04/03/22 -- potassium was low on labs and she was placed on supplement and repeat improved + hemoglobin was low - had trended down a little.  Has breast cancer history, has been survivor for several years now -- left 2012 and right 2016.  They are monitoring her anemia, she reports history of kidney stones, but no recent issues with this.  Denies any night sweats, fevers, fatigue, weight loss unexpected.  Returns to see oncology 08/01/22.  HYPERTENSION Currently taking Lisinopril and ASA and Atorvastatin for HLD.  Last A1c August 6.2%. Has been making diet changes at home. Hypertension status: controlled  Satisfied with current treatment? yes Duration of hypertension: chronic BP monitoring frequency:  daily BP range: 120/70-80 average  BP medication side effects:  no Medication compliance: good compliance Aspirin: yes Recurrent headaches: no Visual changes: no Palpitations: no Dyspnea: no Chest pain: no Lower extremity edema: occasional, baseline -- compression hose at home Dizzy/lightheaded: no  The 10-year ASCVD risk score (Arnett DK, et al., 2019) is: 6%   Values used to calculate the score:     Age: 75 years     Sex: Female     Is Non-Hispanic African American: Yes     Diabetic: No     Tobacco smoker: No     Systolic Blood Pressure: 0000000 mmHg     Is BP treated: Yes     HDL Cholesterol: 66 mg/dL     Total Cholesterol: 151 mg/dL  ASTHMA Using Albuterol and Claritin as needed. 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   Asthma status: stable Satisfied with current treatment?: yes Albuterol/rescue inhaler frequency: one time in past 6 months Dyspnea frequency: no Wheezing frequency: no Cough frequency: no Nocturnal symptom frequency: no Limitation of activity: no Current upper respiratory symptoms: no Triggers: seasonal Home peak flows: no Last Spirometry: unknown Failed/intolerant to following asthma meds: no Asthma meds in past: none Aerochamber/spacer use: no Visits to ER or Urgent Care in past year: no Pneumovax: Up to Date Influenza: Up to Date  OSTEOPENIA DEXA 06/18/20 = the BMD measured at Femur Neck Right is 0.854 g/cm2 with a T-score of -1.3. Adequate calcium & vitamin D: yes Weight bearing exercises: yes    Relevant past medical, surgical, family and social history reviewed and updated as indicated. Interim medical history since our last visit reviewed. Allergies and medications reviewed and updated.  Review of Systems  Constitutional:  Negative for activity change, appetite change, diaphoresis, fatigue and fever.  Respiratory:  Negative for cough, chest tightness and shortness of breath.   Cardiovascular:  Negative for chest pain, palpitations and leg swelling.  Gastrointestinal: Negative.   Endocrine: Negative.   Neurological: Negative.   Psychiatric/Behavioral: Negative.     Per HPI unless specifically indicated above     Objective:    BP 125/84   Pulse 80   Temp (!) 97.5 F (36.4 C) (  Oral)   Ht '5\' 5"'$  (1.651 m)   Wt 197 lb 11.2 oz (89.7 kg)   LMP  (LMP Unknown)   BMI 32.90 kg/m   Wt Readings from Last 3 Encounters:  05/06/22 197 lb 11.2 oz (89.7 kg)  04/03/22 198 lb 9.6 oz (90.1 kg)  11/01/21 205 lb 14.4 oz (93.4 kg)    Physical Exam Vitals and nursing note reviewed.  Constitutional:      General: She is awake. She is not in acute distress.    Appearance: She is well-developed and well-groomed. She is obese. She is not ill-appearing.  HENT:     Head:  Normocephalic.     Right Ear: Hearing normal.     Left Ear: Hearing normal.  Eyes:     General: Lids are normal.        Right eye: No discharge.        Left eye: No discharge.     Conjunctiva/sclera: Conjunctivae normal.     Pupils: Pupils are equal, round, and reactive to light.  Neck:     Thyroid: No thyromegaly.     Vascular: No carotid bruit.  Cardiovascular:     Rate and Rhythm: Normal rate and regular rhythm.     Heart sounds: Normal heart sounds. No murmur heard.    No gallop.     Comments: Compression socks in place. Pulmonary:     Effort: Pulmonary effort is normal. No accessory muscle usage or respiratory distress.     Breath sounds: Normal breath sounds.  Abdominal:     General: Bowel sounds are normal.     Palpations: Abdomen is soft. There is no hepatomegaly or splenomegaly.  Musculoskeletal:     Cervical back: Normal range of motion and neck supple.     Right lower leg: 1+ Edema present.     Left lower leg: 1+ Edema present.  Skin:    General: Skin is warm and dry.  Neurological:     Mental Status: She is alert and oriented to person, place, and time.  Psychiatric:        Attention and Perception: Attention normal.        Mood and Affect: Mood normal.        Speech: Speech normal.        Behavior: Behavior normal. Behavior is cooperative.        Thought Content: Thought content normal.    Results for orders placed or performed in visit on [id number]  Basic metabolic panel  Result Value Ref Range   Sodium 137 135 - 145 mmol/L   Potassium 4.5 3.5 - 5.1 mmol/L   Chloride 104 98 - 111 mmol/L   CO2 25 22 - 32 mmol/L   Glucose, Bld 90 70 - 99 mg/dL   BUN 33 (H) 8 - 23 mg/dL   Creatinine, Ser 1.26 (H) 0.44 - 1.00 mg/dL   Calcium 9.4 8.9 - 10.3 mg/dL   GFR, Estimated 48 (L) >60 mL/min   Anion gap 8 5 - 15      Assessment & Plan:   Problem List Items Addressed This Visit       Cardiovascular and Mediastinum   Hypertension    Chronic, stable.  BP at goal  in office and at home.  Continue current medication regimen and adjust as needed.  Recommend she continue to monitor BP regularly at home and document + focus on DASH diet.  LABS: BMP.  Return to office in 6 months.  Relevant Orders   Microalbumin, Urine Waived   Basic metabolic panel     Respiratory   Asthma    Chronic, stable with minimal use of inhaler.  Continue current medication regimen and adjust as needed.  Spirometry at physical.        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  Has had side effects with Lyrica and Gabapentin, extreme fatigue.  Check Mag level at physical.        Musculoskeletal and Integument   Osteopenia of neck of right femur    Noted on DEXA 06/18/20 -- at this time continue supplements daily and educated patient at length on finding.  Plan on repeat DEXA 06/18/2025.  Check Vitamin D at physical.        Other   Class 2 severe obesity due to excess calories with serious comorbidity and body mass index (BMI) of 35.0 to 35.9 in adult (HCC)    BMI 32.90, ongoing loss, praised for this.  Recommended eating smaller high protein, low fat meals more frequently and exercising 30 mins a day 5 times a week with a goal of 10-15lb weight loss in the next 3 months. Patient voiced their understanding and motivation to adhere to these recommendations.       History of breast cancer    Continue to collaborate with oncology and review notes.      Hyperlipemia, mixed    Chronic, ongoing.  Continue current medication regimen and adjust as needed.  Lipid panel today.      Relevant Orders   Lipid Panel w/o Chol/HDL Ratio   Low hemoglobin    Monitored by oncology--recent labs reviewed.  Continue multivitamin at home.      MGUS (monoclonal gammopathy of unknown significance)    Chronic, ongoing.  Continue collaboration with oncology, recent notes and labs reviewed.  Patient  denies any pain today.      Prediabetes    Noted on past labs, recheck A1c today and continue diet focus.  Initiate medications as needed.  A1c 6.1% today, mild trend down from 6.2% last visit and urine ALB 30. Continue Lisinopril for kidney protection.      Relevant Orders   Bayer DCA Hb A1c Waived   Microalbumin, Urine Waived     Follow up plan: Return in about 6 months (around 11/04/2022) for Annual physical.

## 2022-05-06 NOTE — Assessment & Plan Note (Signed)
Chronic, ongoing.  Continue collaboration with oncology, recent notes and labs reviewed.  Patient denies any pain today.

## 2022-05-06 NOTE — Assessment & Plan Note (Signed)
Noted on past labs, recheck A1c today and continue diet focus.  Initiate medications as needed.  A1c 6.1% today, mild trend down from 6.2% last visit and urine ALB 30. Continue Lisinopril for kidney protection.

## 2022-05-06 NOTE — Assessment & Plan Note (Addendum)
Chronic, stable with minimal use of inhaler.  Continue current medication regimen and adjust as needed.  Spirometry at physical.

## 2022-05-07 LAB — BASIC METABOLIC PANEL
BUN/Creatinine Ratio: 21 (ref 12–28)
BUN: 22 mg/dL (ref 8–27)
CO2: 23 mmol/L (ref 20–29)
Calcium: 9.6 mg/dL (ref 8.7–10.3)
Chloride: 105 mmol/L (ref 96–106)
Creatinine, Ser: 1.04 mg/dL — ABNORMAL HIGH (ref 0.57–1.00)
Glucose: 115 mg/dL — ABNORMAL HIGH (ref 70–99)
Potassium: 3.9 mmol/L (ref 3.5–5.2)
Sodium: 141 mmol/L (ref 134–144)
eGFR: 60 mL/min/{1.73_m2} (ref >59)

## 2022-05-07 LAB — LIPID PANEL W/O CHOL/HDL RATIO
Cholesterol, Total: 156 mg/dL (ref 100–199)
HDL: 63 mg/dL (ref >39)
LDL Chol Calc (NIH): 78 mg/dL (ref 0–99)
Triglycerides: 76 mg/dL (ref 0–149)
VLDL Cholesterol Cal: 15 mg/dL (ref 5–40)

## 2022-05-07 NOTE — Progress Notes (Signed)
Contacted via MyChart   Good evening Kriston, your labs have returned.  Kidney function, creatinine and eGFR, remains stable.  Mild elevation in creatinine, ensure to get plenty of fluid in during daytime hours. Cholesterol levels stable -- continue Atorvastatin. Keep being awesome!!  Thank you for allowing me to participate in your care.  I appreciate you. Kindest regards, Rashiya Lofland

## 2022-06-23 ENCOUNTER — Ambulatory Visit
Admission: RE | Admit: 2022-06-23 | Discharge: 2022-06-23 | Disposition: A | Discharge status: Home / Self Care | Payer: Managed Care, Other (non HMO) | Source: Ambulatory Visit | Attending: Internal Medicine | Admitting: Internal Medicine

## 2022-06-23 ENCOUNTER — Ambulatory Visit
Admission: RE | Admit: 2022-06-23 | Discharge: 2022-06-23 | Disposition: A | Discharge status: Home / Self Care | Payer: Managed Care, Other (non HMO) | Source: Ambulatory Visit | Attending: Internal Medicine

## 2022-06-23 DIAGNOSIS — Z853 Personal history of malignant neoplasm of breast: Secondary | ICD-10-CM

## 2022-06-25 ENCOUNTER — Other Ambulatory Visit: Payer: Self-pay | Admitting: Nurse Practitioner

## 2022-06-26 NOTE — Telephone Encounter (Signed)
Requested Prescriptions  Pending Prescriptions Disp Refills   albuterol (VENTOLIN HFA) 108 (90 Base) MCG/ACT inhaler [Pharmacy Med Name: ALBUTEROL HFA 90MCG/ACT (PV)] 18 g 2    Sig: USE 2 INHALATIONS BY MOUTH EVERY 6 HOURS AS NEEDED FOR WHEEZING  OR SHORTNESS OF BREATH     Pulmonology:  Beta Agonists 2 Passed - 06/25/2022  5:53 AM      Passed - Last BP in normal range    BP Readings from Last 1 Encounters:  05/06/22 125/84         Passed - Last Heart Rate in normal range    Pulse Readings from Last 1 Encounters:  05/06/22 80         Passed - Valid encounter within last 12 months    Recent Outpatient Visits           1 month ago Peripheral neuropathy due to chemotherapy [facility])   [place] [facility] [place], Corrie Dandy T, NP   7 months ago Peripheral neuropathy due to chemotherapy [facility])   [place] [facility] [place], Corrie Dandy T, NP   1 year ago Peripheral neuropathy due to chemotherapy [facility])   [place] [facility] [place], Corrie Dandy T, NP   1 year ago Class 2 severe obesity due to excess calories with serious comorbidity and body mass index (BMI) of 35.0 to 35.9 in adult [facility])   [place] [facility] [place], Corrie Dandy T, NP   1 year ago Chronic right shoulder pain   [place] [facility] Marjie Skiff, NP       Future Appointments             In 3 months Willeen Niece, MD [facility]   In 4 months [place], Dorie Rank, NP [facility]

## 2022-07-23 ENCOUNTER — Other Ambulatory Visit: Payer: Self-pay | Admitting: Internal Medicine

## 2022-07-23 DIAGNOSIS — N63 Unspecified lump in unspecified breast: Secondary | ICD-10-CM

## 2022-07-23 DIAGNOSIS — R928 Other abnormal and inconclusive findings on diagnostic imaging of breast: Secondary | ICD-10-CM

## 2022-08-01 ENCOUNTER — Inpatient Hospital Stay: Payer: Managed Care, Other (non HMO) | Admitting: Internal Medicine

## 2022-08-01 ENCOUNTER — Inpatient Hospital Stay: Payer: Managed Care, Other (non HMO)

## 2022-08-08 ENCOUNTER — Inpatient Hospital Stay (HOSPITAL_BASED_OUTPATIENT_CLINIC_OR_DEPARTMENT_OTHER): Payer: Managed Care, Other (non HMO) | Admitting: Internal Medicine

## 2022-08-08 ENCOUNTER — Inpatient Hospital Stay: Payer: Managed Care, Other (non HMO) | Attending: Internal Medicine

## 2022-08-08 ENCOUNTER — Encounter: Payer: Self-pay | Admitting: Internal Medicine

## 2022-08-08 VITALS — BP 148/91 | HR 52 | Temp 97.1°F | Ht 65.0 in | Wt 202.0 lb

## 2022-08-08 DIAGNOSIS — Z08 Encounter for follow-up examination after completed treatment for malignant neoplasm: Secondary | ICD-10-CM | POA: Diagnosis present

## 2022-08-08 DIAGNOSIS — E876 Hypokalemia: Secondary | ICD-10-CM | POA: Insufficient documentation

## 2022-08-08 DIAGNOSIS — E669 Obesity, unspecified: Secondary | ICD-10-CM | POA: Diagnosis not present

## 2022-08-08 DIAGNOSIS — D472 Monoclonal gammopathy: Secondary | ICD-10-CM

## 2022-08-08 DIAGNOSIS — Z853 Personal history of malignant neoplasm of breast: Secondary | ICD-10-CM | POA: Insufficient documentation

## 2022-08-08 DIAGNOSIS — M8580 Other specified disorders of bone density and structure, unspecified site: Secondary | ICD-10-CM | POA: Diagnosis not present

## 2022-08-08 DIAGNOSIS — E559 Vitamin D deficiency, unspecified: Secondary | ICD-10-CM

## 2022-08-08 DIAGNOSIS — Z6833 Body mass index (BMI) 33.0-33.9, adult: Secondary | ICD-10-CM | POA: Diagnosis not present

## 2022-08-08 DIAGNOSIS — G629 Polyneuropathy, unspecified: Secondary | ICD-10-CM | POA: Diagnosis not present

## 2022-08-08 DIAGNOSIS — D649 Anemia, unspecified: Secondary | ICD-10-CM | POA: Insufficient documentation

## 2022-08-08 LAB — CBC WITH DIFFERENTIAL/PLATELET
Abs Immature Granulocytes: 0.03 10*3/uL (ref 0.00–0.07)
Basophils Absolute: 0 10*3/uL (ref 0.0–0.1)
Basophils Relative: 1 %
Eosinophils Absolute: 0.3 10*3/uL (ref 0.0–0.5)
Eosinophils Relative: 5 %
HCT: 29.3 % — ABNORMAL LOW (ref 36.0–46.0)
Hemoglobin: 9.7 g/dL — ABNORMAL LOW (ref 12.0–15.0)
Immature Granulocytes: 0 %
Lymphocytes Relative: 36 %
Lymphs Abs: 2.6 10*3/uL (ref 0.7–4.0)
MCH: 32 pg (ref 26.0–34.0)
MCHC: 33.1 g/dL (ref 30.0–36.0)
MCV: 96.7 fL (ref 80.0–100.0)
Monocytes Absolute: 0.5 10*3/uL (ref 0.1–1.0)
Monocytes Relative: 6 %
Neutro Abs: 3.8 10*3/uL (ref 1.7–7.7)
Neutrophils Relative %: 52 %
Platelets: 238 10*3/uL (ref 150–400)
RBC: 3.03 MIL/uL — ABNORMAL LOW (ref 3.87–5.11)
RDW: 14.3 % (ref 11.5–15.5)
WBC: 7.3 10*3/uL (ref 4.0–10.5)
nRBC: 0 % (ref 0.0–0.2)

## 2022-08-08 LAB — COMPREHENSIVE METABOLIC PANEL
ALT: 19 U/L (ref 0–44)
AST: 20 U/L (ref 15–41)
Albumin: 3.8 g/dL (ref 3.5–5.0)
Alkaline Phosphatase: 84 U/L (ref 38–126)
Anion gap: 6 (ref 5–15)
BUN: 24 mg/dL — ABNORMAL HIGH (ref 8–23)
CO2: 27 mmol/L (ref 22–32)
Calcium: 9.5 mg/dL (ref 8.9–10.3)
Chloride: 106 mmol/L (ref 98–111)
Creatinine, Ser: 0.9 mg/dL (ref 0.44–1.00)
GFR, Estimated: >60 mL/min (ref >60)
Glucose, Bld: 102 mg/dL — ABNORMAL HIGH (ref 70–99)
Potassium: 3.8 mmol/L (ref 3.5–5.1)
Sodium: 139 mmol/L (ref 135–145)
Total Bilirubin: 0.6 mg/dL (ref 0.3–1.2)
Total Protein: 8.1 g/dL (ref 6.5–8.1)

## 2022-08-08 LAB — LACTATE DEHYDROGENASE: LDH: 164 U/L (ref 98–192)

## 2022-08-08 LAB — VITAMIN D 25 HYDROXY (VIT D DEFICIENCY, FRACTURES): Vit D, 25-Hydroxy: 39 ng/mL (ref 30–100)

## 2022-08-08 LAB — FOLATE: Folate: >40 ng/mL (ref >5.9)

## 2022-08-08 LAB — VITAMIN B12: Vitamin B-12: 1299 pg/mL — ABNORMAL HIGH (ref 180–914)

## 2022-08-08 NOTE — Progress Notes (Signed)
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   # 2012 LEFT BREAST CA STAGE I s/p Lumpec [T1 (0.7CM);Triple Neg; G-3]; s/p mammosite RT; No adj chemo  #  Mild Anemia/M-protein 0.8gm/dl [sep 1610]  # BRCA- 1&2 NEGATIVE [july 2016]; s/p Genetic counseling [June 2020]  # PN s/p acupuncture  --------------------------------------------------     DIAGNOSIS: breast cancer  STAGE:   I      ;GOALS: cure  CURRENT/MOST RECENT THERAPY: surveillaince     History of breast cancer  08/31/2018 Genetic Testing   Two VUS identified on the CustomNext-Cancer+RNAinsight.  CFTR p.R450I and CTNNA1 p.L402S VUS.  The CustomNext-Expanded gene panel offered by [facility] and includes sequencing and rearrangement analysis for the following 81 genes: AIP, ALK, APC*, ATM*, AXIN2, BAP1, BARD1, BLM, BMPR1A, BRCA1*, BRCA2*, BRIP1*, CDC73, CDH1*, CDK4, CDKN1B, CDKN2A, CHEK2*, CTNNA1, DICER1, FANCC, FH, FLCN, GALNT12, HOXB13, KIT, MAX, MEN1, MET, MLH1*, MRE11A, MSH2*, MSH6*, MUTYH*, NBN, NF1*, NF2, NTHL1, PALB2*, PDGFRA, PHOX2B, PMS2*, POLD1, POLE, POT1, PRKAR1A, PTCH1, PTEN*, RAD50, RAD51C*, RAD51D*, RB1, RET, SDHA, SDHAF2, SDHB, SDHC, SDHD, SMAD4, SMARCA4, SMARCB1, SMARCE1, STK11, SUFU, TMEM127, TP53*, TSC1, TSC2, VHL and XRCC2 (sequencing and deletion/duplication); CASR, CFTR, CPA1, CTRC, EGFR, MITF, PRSS1 and SPINK1 (sequencing only); EPCAM and GREM1 (deletion/duplication only). DNA and RNA analyses performed for * genes. The report date is September 01, 2018.     INTERVAL HISTORY: Alone. Ambulating independently.  A pleasant 62 year old female patient with above history of stage I right-sided triple negative breast cancer; MGUS- Is here for follow-up.   Concerned with last mammogram results.    Denies any swelling.  Chronic tingling and numbness not any worse.  Appetite is good.  No weight loss.  Denies any new lumps or bumps.   No worsening back pain.  Review of Systems  Constitutional:  Negative for chills, diaphoresis, fever, malaise/fatigue and weight loss.  HENT:  Negative for nosebleeds and sore throat.   Eyes:  Negative for double vision.  Respiratory:  Negative for cough, hemoptysis, sputum production, shortness of breath and wheezing.   Cardiovascular:  Negative for chest pain, palpitations, orthopnea and leg swelling.  Gastrointestinal:  Negative for abdominal pain, blood in stool, constipation, diarrhea, heartburn, melena, nausea and vomiting.  Genitourinary:  Negative for dysuria, frequency and urgency.  Musculoskeletal:  Positive for joint pain. Negative for back pain.  Skin: Negative.  Negative for itching and rash.  Neurological:  Positive for tingling. Negative for dizziness, focal weakness, weakness and headaches.  Endo/Heme/Allergies:  Does not bruise/bleed easily.  Psychiatric/Behavioral:  Negative for depression. The patient is not nervous/anxious and does not have insomnia.      PAST MEDICAL HISTORY :  Past Medical History:  Diagnosis Date   Anemia    Asthma    BRCA negative 09/22/2014   Breast cancer (HCC) 2012   LT LUMPECTOMY   Breast cancer (HCC) 2016   RT LUMPECTOMY   Breast screening, unspecified    Constipation    Dry eyes 2012   Eczema    Family history of breast cancer  Family history of ovarian cancer    Family history of prostate cancer    GERD (gastroesophageal reflux disease)    Hematuria    Kidney stone    Lump or mass in breast    Malignant neoplasm of breast (female),  unspecified site 07/08/2014   Right breast, 5 mm, T1a,N0, triple negative; high risk for recurrence on Mammoprint.   Malignant neoplasm of upper-outer quadrant of female breast (HCC) 05/08/2010   Left breast: T1b, N0, M); triple negative, DCIS present. No chemotherapy. MammoSite radiation.   Neuropathy    pt states in fingertips and feet   Obesity, unspecified    Pancreatic lesion    Personal history of chemotherapy 2016   BREAST CA- Right   Personal history of malignant neoplasm of breast 2012   has been treated with left breast wide excision and radiation therapy; Patient has DCIS as well as a 7 mm infiltrating mammary carcinoma triple negative removed on 05-08-10   Personal history of radiation therapy 2012   BREAST CA - MAMMOSITE- Left   Personal history of radiation therapy 2016   BREAST CA - MAMMOSITE- Right   Screening for obesity    Seasonal allergies    Special screening for malignant neoplasms, colon    Unspecified essential hypertension     PAST SURGICAL HISTORY :   Past Surgical History:  Procedure Laterality Date   BREAST BIOPSY Right 2016   Invasive   BREAST CYST ASPIRATION Left 2010   BREAST LUMPECTOMY Left 2012   BREAST CA   BREAST LUMPECTOMY Right 2016   INVASIVE MAMMARY CARCINOMA   BREAST LUMPECTOMY WITH AXILLARY LYMPH NODE BIOPSY Right 08/01/2014   Procedure: BREAST LUMPECTOMY WITH AXILLARY LYMPH NODE BIOPSY;  Surgeon: Earline Mayotte, MD;  Location: [facility] ORS;  Service: General;  Laterality: Right;   BREAST MAMMOSITE  April 2012   mammosite placement and removal    BREAST SURGERY Left 2012   left breast wide excision, sentinel node, MammoSite   BREAST SURGERY Right 08/06/2014   Wide excision/sentinel node biopsy, T1a, N0 triple negative, MammoSite   CESAREAN SECTION  1988   COLONOSCOPY  October 17, 2009   Dr. Niel Hummer, normal study.   KIDNEY STONE SURGERY  2003   stones removed by Dr. Reuel Boom- stent placed/removed   PORTACATH PLACEMENT Left 11/09/2014    Procedure: INSERTION PORT-A-CATH;  Surgeon: Earline Mayotte, MD;  Location: [facility] ORS;  Service: General;  Laterality: Left;   SENTINEL NODE BIOPSY Right 08/01/2014   Procedure: SENTINEL NODE BIOPSY;  Surgeon: Earline Mayotte, MD;  Location: [facility] ORS;  Service: General;  Laterality: Right;   TUBAL LIGATION      FAMILY HISTORY :   Family History  Problem Relation Age of Onset   Ovarian cancer Mother    Diabetes Mother    Diabetes Father    Prostate cancer Father        dx in his 43s   Hypertension Brother    Cancer Maternal Grandmother        bone   Cancer Maternal Grandfather        lung   Cancer Paternal Grandmother        lung   Asthma Paternal Grandfather    Hypertension Sister    Prostate cancer Brother 93   AAA (abdominal aortic aneurysm) Maternal Uncle    Breast cancer Other     SOCIAL HISTORY:   Social History   Tobacco Use   Smoking status: Never   Smokeless  tobacco: Never  Vaping Use   Vaping Use: Never used  Substance Use Topics   Alcohol use: Yes    Comment: occasionally   Drug use: No    ALLERGIES:  is allergic to cayenne pepper [cayenne], shellfish allergy, and tape.  MEDICATIONS:  Current Outpatient Medications  Medication Sig Dispense Refill   albuterol (VENTOLIN HFA) 108 (90 Base) MCG/ACT inhaler USE 2 INHALATIONS BY MOUTH EVERY 6 HOURS AS NEEDED FOR WHEEZING  OR SHORTNESS OF BREATH 18 g 2   aspirin 81 MG tablet Take 81 mg by mouth daily.     atorvastatin (LIPITOR) 10 MG tablet Take 1 tablet (10 mg total) by mouth daily. 90 tablet 4   bisacodyl (DULCOLAX) 5 MG EC tablet Take 5 mg by mouth daily as needed for moderate constipation.     Cholecalciferol (VITAMIN D3) 1000 UNITS CAPS Take 1 capsule by mouth daily.      clobetasol (TEMOVATE) 0.05 % external solution Apply 1 application topically daily. Mix bottle in 1 tub of cerave cream and use qd to trunk, arms and legs, avoid face, groin, axilla 50 mL 1   Dupilumab (DUPIXENT) 300 MG/2ML SOPN Inject  300 mg into the skin every 14 (fourteen) days. Starting at day 15 for maintenance. 4 mL 6   esomeprazole (NEXIUM) 40 MG capsule Take 40 mg by mouth 2 (two) times daily as needed (acid reflux).      lisinopril (ZESTRIL) 10 MG tablet Take 1 tablet (10 mg total) by mouth daily. 90 tablet 4   loratadine (CLARITIN) 10 MG tablet Take 10 mg by mouth daily as needed. Reported on 04/13/2015     polyethylene glycol (MIRALAX / GLYCOLAX) packet Take 17 g by mouth every other day.     potassium chloride SA (KLOR-CON M) 20 MEQ tablet 1 pill a day. 30 tablet 5   Turmeric 500 MG CAPS Take 500 mg by mouth daily.     No current facility-administered medications for this visit.   Facility-Administered Medications Ordered in Other Visits  Medication Dose Route Frequency Provider Last Rate Last Admin   sodium chloride 0.9 % injection 10 mL  10 mL Intravenous PRN Janese Banks, MD   10 mL at 11/10/14 1138    PHYSICAL EXAMINATION: ECOG PERFORMANCE STATUS: 1 - Symptomatic but completely ambulatory  BP (!) 148/91 (BP Location: Left Arm, Patient Position: Sitting, Cuff Size: Large)   Pulse (!) 52   Temp (!) 97.1 F (36.2 C) (Tympanic)   Ht 5\' 5"  (1.651 m)   Wt 202 lb (91.6 kg)   LMP  (LMP Unknown)   BMI 33.61 kg/m   Filed Weights   08/08/22 0942  Weight: 202 lb (91.6 kg)    Physical Exam HENT:     Head: Normocephalic and atraumatic.     Mouth/Throat:     Pharynx: No oropharyngeal exudate.  Eyes:     Pupils: Pupils are equal, round, and reactive to light.  Cardiovascular:     Rate and Rhythm: Normal rate and regular rhythm.  Pulmonary:     Effort: No respiratory distress.     Breath sounds: No wheezing.  Abdominal:     General: Bowel sounds are normal. There is no distension.     Palpations: Abdomen is soft. There is no mass.     Tenderness: There is no abdominal tenderness. There is no guarding or rebound.  Musculoskeletal:        General: No tenderness. Normal range of motion.  Cervical  back: Normal range of motion and neck supple.  Skin:    General: Skin is warm.  Neurological:     Mental Status: She is alert and oriented to person, place, and time.  Psychiatric:        Mood and Affect: Affect normal.      LABORATORY DATA:  I have reviewed the data as listed    Component Value Date/Time   NA 139 08/08/2022 0936   NA 141 05/06/2022 1036   NA 140 07/16/2011 2130   K 3.8 08/08/2022 0936   K 3.7 07/16/2011 2130   CL 106 08/08/2022 0936   CL 106 07/16/2011 2130   CO2 27 08/08/2022 0936   CO2 27 07/16/2011 2130   GLUCOSE 102 (H) 08/08/2022 0936   GLUCOSE 93 07/16/2011 2130   BUN 24 (H) 08/08/2022 0936   BUN 22 05/06/2022 1036   BUN 16 07/16/2011 2130   CREATININE 0.90 08/08/2022 0936   CREATININE 0.95 07/16/2011 2130   CALCIUM 9.5 08/08/2022 0936   CALCIUM 9.0 07/16/2011 2130   PROT 8.1 08/08/2022 0936   PROT 7.1 10/01/2020 0902   ALBUMIN 3.8 08/08/2022 0936   ALBUMIN 4.2 10/01/2020 0902   AST 20 08/08/2022 0936   ALT 19 08/08/2022 0936   ALKPHOS 84 08/08/2022 0936   BILITOT 0.6 08/08/2022 0936   BILITOT 0.3 10/01/2020 0902   GFRNONAA >60 08/08/2022 0936   GFRNONAA >60 07/16/2011 2130   GFRAA 80 04/16/2020 1159   GFRAA >60 07/16/2011 2130    No results found for: "SPEP", "UPEP"  Lab Results  Component Value Date   WBC 7.3 08/08/2022   NEUTROABS 3.8 08/08/2022   HGB 9.7 (L) 08/08/2022   HCT 29.3 (L) 08/08/2022   MCV 96.7 08/08/2022   PLT 238 08/08/2022      Chemistry      Component Value Date/Time   NA 139 08/08/2022 0936   NA 141 05/06/2022 1036   NA 140 07/16/2011 2130   K 3.8 08/08/2022 0936   K 3.7 07/16/2011 2130   CL 106 08/08/2022 0936   CL 106 07/16/2011 2130   CO2 27 08/08/2022 0936   CO2 27 07/16/2011 2130   BUN 24 (H) 08/08/2022 0936   BUN 22 05/06/2022 1036   BUN 16 07/16/2011 2130   CREATININE 0.90 08/08/2022 0936   CREATININE 0.95 07/16/2011 2130      Component Value Date/Time   CALCIUM 9.5 08/08/2022 0936    CALCIUM 9.0 07/16/2011 2130   ALKPHOS 84 08/08/2022 0936   AST 20 08/08/2022 0936   ALT 19 08/08/2022 0936   BILITOT 0.6 08/08/2022 0936   BILITOT 0.3 10/01/2020 0902         ASSESSMENT & PLAN:   MGUS (monoclonal gammopathy of unknown significance) # [1610- 1gm/dl]MGUS- JAN 9604--V protein- 1.2; Kappa/lamda light chain= 2.8 [K=33].  Overall stable except for mild anemia see below.  No evidence of renal function or hypercalcemia. MM Labs pending today.stable  # chronic mild anemia-unclear etiology [BL  9-10]; However today Hb 9.7- overaall stable; JAN 2024- ironstudies-13 /ferritin-103.   It is clinically unlikely the patient anemia is related to her underlying monoclonal gammopathy. Recommend #Recommend gentle iron [iron biglycinate; 28 mg ] 1 pill a day.  This pill is unlikely to cause stomach upset or cause constipation.   # Chronic mild hypokalemia  [left leg cramps]-today potassium is 3.8.  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  Clinically no evidence of recurrence; see below re: mammogram  # APRIL 2024- Multiple probably benign masses within the left breast, favored to represent complicated cysts. Given the imaging appearance on mammography as well, a follow-up left breast diagnostic mammogram and ultrasound is recommended in 6 months/ OCT 2024- ordered.   # Peripheral neuropathy- sec to chemo. grade 1-2-stable.   #Osteopenia : Reviewed the bone density  [April 2022-]T-score of -1.3; continue VitD. [hx of falls]; recommend resistance training [pt currently on @ home/videos] - check BMD in oct 2024. stable  # Obesity- recommend continued weight loss efforts  # DISPOSITION:  # Left sided diagnostic Mammo/US & BMD in Oct 2024- # referral to Dr.Cintron: Hx of right breast cancer; and cysts in mammo # follow up in 6 months-2 weeks prior- MD Vickie Epley- cbc/cmp; b12; folic acid; LDH-/ca-27-29/MM panel;  K-L panel; vit D -OH levels-  Dr.B      Earna Coder, MD 08/08/2022 10:49 AM

## 2022-08-08 NOTE — Patient Instructions (Signed)
#  Recommend gentle iron [iron biglycinate; 28 mg ] 1 pill a day.  This pill is unlikely to cause stomach upset or cause constipation.  

## 2022-08-08 NOTE — Assessment & Plan Note (Addendum)
# [  2018- 1gm/dl]MGUS- JAN 1610--R protein- 1.2; Kappa/lamda light chain= 2.8 [K=33].  Overall stable except for mild anemia see below.  No evidence of renal function or hypercalcemia. MM Labs pending today.stable  # chronic mild anemia-unclear etiology [BL  9-10]; However today Hb 9.7- overaall stable; JAN 2024- ironstudies-13 /ferritin-103.   It is clinically unlikely the patient anemia is related to her underlying monoclonal gammopathy. Recommend #Recommend gentle iron [iron biglycinate; 28 mg ] 1 pill a day.  This pill is unlikely to cause stomach upset or cause constipation.   # Chronic mild hypokalemia  [left leg cramps]-today potassium is 3.8.  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  Clinically no evidence of recurrence; see below re: mammogram  # APRIL 2024- Multiple probably benign masses within the left breast, favored to represent complicated cysts. Given the imaging appearance on mammography as well, a follow-up left breast diagnostic mammogram and ultrasound is recommended in 6 months/ OCT 2024- ordered.   # Peripheral neuropathy- sec to chemo. grade 1-2-stable.   #Osteopenia : Reviewed the bone density  [April 2022-]T-score of -1.3; continue VitD. [hx of falls]; recommend resistance training [pt currently on @ home/videos] - check BMD in oct 2024. stable  # Obesity- recommend continued weight loss efforts  # DISPOSITION:  # Left sided diagnostic Mammo/US & BMD in Oct 2024- # referral to Dr.Cintron: Hx of right breast cancer; and cysts in mammo # follow up in 6 months-2 weeks prior- MD Vickie Epley- cbc/cmp; b12; folic acid; LDH-/ca-27-29/MM panel;  K-L panel; vit D -OH levels- Dr.B

## 2022-08-08 NOTE — Progress Notes (Signed)
Concerned with last mammogram results. Please review with her.

## 2022-08-09 LAB — CANCER ANTIGEN 27.29: CA 27.29: 16.8 U/mL (ref 0.0–38.6)

## 2022-08-11 ENCOUNTER — Encounter: Payer: Self-pay | Admitting: Internal Medicine

## 2022-08-11 LAB — KAPPA/LAMBDA LIGHT CHAINS
Kappa free light chain: 40.7 mg/L — ABNORMAL HIGH (ref 3.3–19.4)
Kappa, lambda light chain ratio: 2.51 — ABNORMAL HIGH (ref 0.26–1.65)
Lambda free light chains: 16.2 mg/L (ref 5.7–26.3)

## 2022-08-18 LAB — MULTIPLE MYELOMA PANEL, SERUM
Albumin SerPl Elph-Mcnc: 3.5 g/dL (ref 2.9–4.4)
Albumin/Glob SerPl: 0.9 (ref 0.7–1.7)
Alpha 1: 0.3 g/dL (ref 0.0–0.4)
Alpha2 Glob SerPl Elph-Mcnc: 0.9 g/dL (ref 0.4–1.0)
B-Globulin SerPl Elph-Mcnc: 1.1 g/dL (ref 0.7–1.3)
Gamma Glob SerPl Elph-Mcnc: 1.6 g/dL (ref 0.4–1.8)
Globulin, Total: 3.9 g/dL (ref 2.2–3.9)
IgA: 144 mg/dL (ref 87–352)
IgG (Immunoglobin G), Serum: 2065 mg/dL — ABNORMAL HIGH (ref 586–1602)
IgM (Immunoglobulin M), Srm: 49 mg/dL (ref 26–217)
M Protein SerPl Elph-Mcnc: 1.1 g/dL — ABNORMAL HIGH
Total Protein ELP: 7.4 g/dL (ref 6.0–8.5)

## 2022-09-26 ENCOUNTER — Other Ambulatory Visit: Payer: Self-pay | Admitting: Internal Medicine

## 2022-10-20 ENCOUNTER — Ambulatory Visit (INDEPENDENT_AMBULATORY_CARE_PROVIDER_SITE_OTHER): Payer: Managed Care, Other (non HMO) | Admitting: Dermatology

## 2022-10-20 VITALS — BP 156/86 | HR 79

## 2022-10-20 DIAGNOSIS — Z79899 Other long term (current) drug therapy: Secondary | ICD-10-CM | POA: Diagnosis not present

## 2022-10-20 DIAGNOSIS — L209 Atopic dermatitis, unspecified: Secondary | ICD-10-CM | POA: Diagnosis not present

## 2022-10-20 MED ORDER — TACROLIMUS 0.1 % EX OINT: TOPICAL_OINTMENT | CUTANEOUS | 1 refills | Status: DC

## 2022-10-20 NOTE — Patient Instructions (Signed)

## 2022-10-20 NOTE — Progress Notes (Signed)
   Follow-Up Visit   Subjective  Shaniece L Dold is a 62 y.o. female who presents for the following: 6 months Eczema (Trunk, extremities, 27m f/u, Dupixent sq injections q 2 wks, Clobetasol/cerave mix qd and using otc  La Roche-Posay moisturizer.  No side effects from Dupixent, no eye symptoms. Patient c/o rough dry spots on her arms and legs.   The following portions of the chart were reviewed this encounter and updated as appropriate: medications, allergies, medical history  Review of Systems:  No other skin or systemic complaints except as noted in HPI or Assessment and Plan.  Objective  Well appearing patient in no apparent distress; mood and affect are within normal limits.  Areas Examined:face, trunk, extremities   Relevant physical exam findings are noted in the Assessment and Plan.    Assessment & Plan   ATOPIC DERMATITIS Exam: hyperpigmented scaly patches on her arms, legs and sacral area  1% BSA  Chronic condition with duration or expected duration over one year. Currently well-controlled.   Atopic dermatitis - Severe, on Dupixent (biologic medication).  Atopic dermatitis (eczema) is a chronic, relapsing, pruritic condition that can significantly affect quality of life. It is often associated with allergic rhinitis and/or asthma and can require treatment with topical medications, phototherapy, or in severe cases biologic medications, which require long term medication management.    Treatment Plan:  Start Tacrolimus ointment spot treat affected areas on skin once or twice a day Cont Dupixent 300mg /60ml sq injections q 2 wks Cont Clobetasol/cerave mix qd/bid to areas of eczema/itching prn flares (spot treat), avoid f/g/a Cont Cerave cream daily to body after shower Or Cont  La Roche-Posay moisturizer daily   Dupilumab (Dupixent) is a treatment given by injection for adults and children with moderate-to-severe atopic dermatitis. Goal is control of skin condition, not  cure. It is given as 2 injections at the first dose followed by 1 injection ever 2 weeks thereafter.  Young children are dosed monthly.  Potential side effects include allergic reaction, herpes infections, injection site reactions and conjunctivitis (inflammation of the eyes).  The use of Dupixent requires long term medication management, including periodic office visits.   Recommend gentle skin care.  Long term medication management.  Patient is using long term (months to years) prescription medication  to control their dermatologic condition.  These medications require periodic monitoring to evaluate for efficacy and side effects and may require periodic laboratory monitoring.    Return in about 6 months (around 04/22/2023) for Atopic dermattis .  I, Angelique Holm, CMA, am acting as scribe for Willeen Niece, MD .   Documentation: I have reviewed the above documentation for accuracy and completeness, and I agree with the above.  Willeen Niece, MD

## 2022-10-26 ENCOUNTER — Encounter: Payer: Self-pay | Admitting: Dermatology

## 2022-10-28 ENCOUNTER — Other Ambulatory Visit: Payer: Self-pay | Admitting: Nurse Practitioner

## 2022-10-29 NOTE — Telephone Encounter (Signed)
Requested Prescriptions  Pending Prescriptions Disp Refills   albuterol (VENTOLIN HFA) 108 (90 Base) MCG/ACT inhaler [Pharmacy Med Name: ALBUTEROL HFA 90MCG/ACT (PV)] 26.8 g 3    Sig: USE 2 INHALATIONS BY MOUTH EVERY 6 HOURS AS NEEDED FOR WHEEZING  OR SHORTNESS OF BREATH     Pulmonology:  Beta Agonists 2 Failed - 10/28/2022  4:42 AM      Failed - Last BP in normal range    BP Readings from Last 1 Encounters:  10/20/22 (!) 156/86         Passed - Last Heart Rate in normal range    Pulse Readings from Last 1 Encounters:  10/20/22 79         Passed - Valid encounter within last 12 months    Recent Outpatient Visits           5 months ago Peripheral neuropathy due to chemotherapy [facility])   [place] [facility] [place], Corrie Dandy T, NP   12 months ago Peripheral neuropathy due to chemotherapy [facility])   [place] [facility] [place], Corrie Dandy T, NP   1 year ago Peripheral neuropathy due to chemotherapy [facility])   [place] [facility] [place], Corrie Dandy T, NP   1 year ago Class 2 severe obesity due to excess calories with serious comorbidity and body mass index (BMI) of 35.0 to 35.9 in adult [facility])   [place] [facility] [place], Corrie Dandy T, NP   1 year ago Chronic right shoulder pain   [place] [facility] [place], Dorie Rank, NP       Future Appointments             In 6 days Marjie Skiff, NP [place] [facility]   In 6 months Willeen Niece, MD [facility]

## 2022-11-01 NOTE — Patient Instructions (Signed)
Be Involved in Caring For Your Health:  Taking Medications When medications are taken as directed, they can greatly improve your health. But if they are not taken as prescribed, they may not work. In some cases, not taking them correctly can be harmful. To help ensure your treatment remains effective and safe, understand your medications and how to take them. Bring your medications to each visit for review by your provider.  Your lab results, notes, and after visit summary will be available on My Chart. We strongly encourage you to use this feature. If lab results are abnormal the clinic will contact you with the appropriate steps. If the clinic does not contact you assume the results are satisfactory. You can always view your results on My Chart. If you have questions regarding your health or results, please contact the clinic during office hours. You can also ask questions on My Chart.  We at [facility] are grateful that you chose us to provide your care. We strive to provide evidence-based and compassionate care and are always looking for feedback. If you get a survey from the clinic please complete this so we can hear your opinions.  DASH Eating Plan DASH stands for Dietary Approaches to Stop Hypertension. The DASH eating plan is a healthy eating plan that has been shown to: Lower high blood pressure (hypertension). Reduce your risk for type 2 diabetes, heart disease, and stroke. Help with weight loss. What are tips for following this plan? Reading food labels Check food labels for the amount of salt (sodium) per serving. Choose foods with less than 5 percent of the Daily Value (DV) of sodium. In general, foods with less than 300 milligrams (mg) of sodium per serving fit into this eating plan. To find whole grains, look for the word "whole" as the first word in the ingredient list. Shopping Buy products labeled as "low-sodium" or "no salt added." Buy fresh foods. Avoid canned  foods and pre-made or frozen meals. Cooking Try not to add salt when you cook. Use salt-free seasonings or herbs instead of table salt or sea salt. Check with your health care provider or pharmacist before using salt substitutes. Do not fry foods. Cook foods in healthy ways, such as baking, boiling, grilling, roasting, or broiling. Cook using oils that are good for your heart. These include olive, canola, avocado, soybean, and sunflower oil. Meal planning  Eat a balanced diet. This should include: 4 or more servings of fruits and 4 or more servings of vegetables each day. Try to fill half of your plate with fruits and vegetables. 6-8 servings of whole grains each day. 6 or less servings of lean meat, poultry, or fish each day. 1 oz is 1 serving. A 3 oz (85 g) serving of meat is about the same size as the palm of your hand. One egg is 1 oz (28 g). 2-3 servings of low-fat dairy each day. One serving is 1 cup (237 mL). 1 serving of nuts, seeds, or beans 5 times each week. 2-3 servings of heart-healthy fats. Healthy fats called omega-3 fatty acids are found in foods such as walnuts, flaxseeds, fortified milks, and eggs. These fats are also found in cold-water fish, such as sardines, salmon, and mackerel. Limit how much you eat of: Canned or prepackaged foods. Food that is high in trans fat, such as fried foods. Food that is high in saturated fat, such as fatty meat. Desserts and other sweets, sugary drinks, and other foods with added sugar. Full-fat   dairy products. Do not salt foods before eating. Do not eat more than 4 egg yolks a week. Try to eat at least 2 vegetarian meals a week. Eat more home-cooked food and less restaurant, buffet, and fast food. Lifestyle When eating at a restaurant, ask if your food can be made with less salt or no salt. If you drink alcohol: Limit how much you have to: 0-1 drink a day if you are female. 0-2 drinks a day if you are female. Know how much alcohol is in  your drink. In the [place] one drink is one 12 oz bottle of beer (355 mL), one 5 oz glass of wine (148 mL), or one 1 oz glass of hard liquor (44 mL). General information Avoid eating more than 2,300 mg of salt a day. If you have hypertension, you may need to reduce your sodium intake to 1,500 mg a day. Work with your provider to stay at a healthy body weight or lose weight. Ask what the best weight range is for you. On most days of the week, get at least 30 minutes of exercise that causes your heart to beat faster. This may include walking, swimming, or biking. Work with your provider or dietitian to adjust your eating plan to meet your specific calorie needs. What foods should I eat? Fruits All fresh, dried, or frozen fruit. Canned fruits that are in their natural juice and do not have sugar added to them. Vegetables Fresh or frozen vegetables that are raw, steamed, roasted, or grilled. Low-sodium or reduced-sodium tomato and vegetable juice. Low-sodium or reduced-sodium tomato sauce and tomato paste. Low-sodium or reduced-sodium canned vegetables. Grains Whole-grain or whole-wheat bread. Whole-grain or whole-wheat pasta. Brown rice. Oatmeal. Quinoa. Bulgur. Whole-grain and low-sodium cereals. Pita bread. Low-fat, low-sodium crackers. Whole-wheat flour tortillas. Meats and other proteins Skinless chicken or turkey. Ground chicken or turkey. Pork with fat trimmed off. Fish and seafood. Egg whites. Dried beans, peas, or lentils. Unsalted nuts, nut butters, and seeds. Unsalted canned beans. Lean cuts of beef with fat trimmed off. Low-sodium, lean precooked or cured meat, such as sausages or meat loaves. Dairy Low-fat (1%) or fat-free (skim) milk. Reduced-fat, low-fat, or fat-free cheeses. Nonfat, low-sodium ricotta or cottage cheese. Low-fat or nonfat yogurt. Low-fat, low-sodium cheese. Fats and oils Soft margarine without trans fats. Vegetable oil. Reduced-fat, low-fat, or light mayonnaise and salad  dressings (reduced-sodium). Canola, safflower, olive, avocado, soybean, and sunflower oils. Avocado. Seasonings and condiments Herbs. Spices. Seasoning mixes without salt. Other foods Unsalted popcorn and pretzels. Fat-free sweets. The items listed above may not be all the foods and drinks you can have. Talk to a dietitian to learn more. What foods should I avoid? Fruits Canned fruit in a light or heavy syrup. Fried fruit. Fruit in cream or butter sauce. Vegetables Creamed or fried vegetables. Vegetables in a cheese sauce. Regular canned vegetables that are not marked as low-sodium or reduced-sodium. Regular canned tomato sauce and paste that are not marked as low-sodium or reduced-sodium. Regular tomato and vegetable juices that are not marked as low-sodium or reduced-sodium. Pickles. Olives. Grains Baked goods made with fat, such as croissants, muffins, or some breads. Dry pasta or rice meal packs. Meats and other proteins Fatty cuts of meat. Ribs. Fried meat. Bacon. Bologna, salami, and other precooked or cured meats, such as sausages or meat loaves, that are not lean and low in sodium. Fat from the back of a pig (fatback). Bratwurst. Salted nuts and seeds. Canned beans with added salt. Canned   or smoked fish. Whole eggs or egg yolks. Chicken or turkey with skin. Dairy Whole or 2% milk, cream, and half-and-half. Whole or full-fat cream cheese. Whole-fat or sweetened yogurt. Full-fat cheese. Nondairy creamers. Whipped toppings. Processed cheese and cheese spreads. Fats and oils Butter. Stick margarine. Lard. Shortening. Ghee. Bacon fat. Tropical oils, such as coconut, palm kernel, or palm oil. Seasonings and condiments Onion salt, garlic salt, seasoned salt, table salt, and sea salt. Worcestershire sauce. Tartar sauce. Barbecue sauce. Teriyaki sauce. Soy sauce, including reduced-sodium soy sauce. Steak sauce. Canned and packaged gravies. Fish sauce. Oyster sauce. Cocktail sauce. Store-bought  horseradish. Ketchup. Mustard. Meat flavorings and tenderizers. Bouillon cubes. Hot sauces. Pre-made or packaged marinades. Pre-made or packaged taco seasonings. Relishes. Regular salad dressings. Other foods Salted popcorn and pretzels. The items listed above may not be all the foods and drinks you should avoid. Talk to a dietitian to learn more. Where to find more information National Heart, Lung, and Blood Institute (NHLBI): nhlbi.nih.gov American Heart Association (AHA): heart.org Academy of Nutrition and Dietetics: eatright.org National Kidney Foundation (NKF): kidney.org This information is not intended to replace advice given to you by your health care provider. Make sure you discuss any questions you have with your health care provider. Document Revised: 03/13/2022 Document Reviewed: 03/13/2022 Elsevier Patient Education  2024 [employer]  

## 2022-11-04 ENCOUNTER — Ambulatory Visit: Payer: Managed Care, Other (non HMO) | Admitting: Nurse Practitioner

## 2022-11-04 ENCOUNTER — Encounter: Payer: Self-pay | Admitting: Nurse Practitioner

## 2022-11-04 VITALS — BP 131/81 | HR 61 | Temp 97.7°F | Ht 65.16 in | Wt 201.8 lb

## 2022-11-04 DIAGNOSIS — D649 Anemia, unspecified: Secondary | ICD-10-CM

## 2022-11-04 DIAGNOSIS — G620 Drug-induced polyneuropathy: Secondary | ICD-10-CM

## 2022-11-04 DIAGNOSIS — Z23 Encounter for immunization: Secondary | ICD-10-CM | POA: Diagnosis not present

## 2022-11-04 DIAGNOSIS — Z853 Personal history of malignant neoplasm of breast: Secondary | ICD-10-CM

## 2022-11-04 DIAGNOSIS — I10 Essential (primary) hypertension: Secondary | ICD-10-CM

## 2022-11-04 DIAGNOSIS — Z0000 Encounter for general adult medical examination without abnormal findings: Secondary | ICD-10-CM | POA: Diagnosis not present

## 2022-11-04 DIAGNOSIS — E782 Mixed hyperlipidemia: Secondary | ICD-10-CM | POA: Diagnosis not present

## 2022-11-04 DIAGNOSIS — E6609 Other obesity due to excess calories: Secondary | ICD-10-CM

## 2022-11-04 DIAGNOSIS — K219 Gastro-esophageal reflux disease without esophagitis: Secondary | ICD-10-CM

## 2022-11-04 DIAGNOSIS — J4520 Mild intermittent asthma, uncomplicated: Secondary | ICD-10-CM

## 2022-11-04 DIAGNOSIS — D472 Monoclonal gammopathy: Secondary | ICD-10-CM

## 2022-11-04 DIAGNOSIS — T451X5A Adverse effect of antineoplastic and immunosuppressive drugs, initial encounter: Secondary | ICD-10-CM

## 2022-11-04 DIAGNOSIS — M85851 Other specified disorders of bone density and structure, right thigh: Secondary | ICD-10-CM

## 2022-11-04 DIAGNOSIS — E559 Vitamin D deficiency, unspecified: Secondary | ICD-10-CM

## 2022-11-04 DIAGNOSIS — R7303 Prediabetes: Secondary | ICD-10-CM

## 2022-11-04 MED ORDER — LISINOPRIL 10 MG PO TABS
10.0000 mg | ORAL_TABLET | Freq: Every day | ORAL | 4 refills | Status: DC
Start: 2022-11-04 — End: 2023-11-20

## 2022-11-04 MED ORDER — ALBUTEROL SULFATE HFA 108 (90 BASE) MCG/ACT IN AERS: 2.0000 | INHALATION_SPRAY | Freq: Four times a day (QID) | RESPIRATORY_TRACT | 4 refills | Status: DC | PRN

## 2022-11-04 MED ORDER — ATORVASTATIN CALCIUM 10 MG PO TABS: 10.0000 mg | ORAL_TABLET | Freq: Every day | ORAL | 4 refills | Status: DC

## 2022-11-04 NOTE — Assessment & Plan Note (Signed)
Chronic, stable with minimal use of inhaler.  Continue current medication regimen and adjust as needed.  Spirometry in new year.

## 2022-11-04 NOTE — Assessment & Plan Note (Signed)
Chronic, stable with minimal use of Nexium.  Recommend she continue this only as needed.  Mag level today.

## 2022-11-04 NOTE — Assessment & Plan Note (Signed)
Noted on past labs, recheck A1c today and continue diet focus.  Initiate medications as needed.  A1c 6.1% last visit, mild trend down from 6.2% prior and urine ALB 30 February 2024. Continue Lisinopril for kidney protection.

## 2022-11-04 NOTE — Assessment & Plan Note (Signed)
Chronic, stable with recent levels at goal.  Continue supplement and check level today.

## 2022-11-04 NOTE — Assessment & Plan Note (Signed)
Chronic, stable.  BP at goal for age at home and in office, lower levels at home.  Continue current medication regimen and adjust as needed.  Recommend she continue to monitor BP regularly at home and document + focus on DASH diet.  LABS: CBC, CMP, TSH.  Refills sent in.  Return to office in 6 months.

## 2022-11-04 NOTE — Assessment & Plan Note (Signed)
Noted on DEXA 06/18/20 -- at this time continue supplements daily and educated patient at length on finding.  Repeat DEXA ordered for October, will review.  Check Vitamin D today and continue supplement.

## 2022-11-04 NOTE — Progress Notes (Signed)
BP 131/81   Pulse 61   Temp 97.7 F (36.5 C)   Ht 5' 5.16" (1.655 m)   Wt 201 lb 12.8 oz (91.5 kg)   LMP  (LMP Unknown)   BMI 33.42 kg/m    Subjective:    Patient ID: Adrienne [name], female    DOB: May 24, 1960, 62 y.o.   MRN: [id number]  HPI: Adrienne [name] is a 62 y.o. female presenting on 11/04/2022 for comprehensive medical examination. Current medical complaints include:none  She currently lives with: husband Menopausal Symptoms: no   HYPERTENSION/HLD Currently taking Lisinopril & Atorvastatin and ASA.   Hypertension status: controlled  Satisfied with current treatment? yes Duration of hypertension: chronic BP monitoring frequency: every other day BP range: 110 -120/60-70 BP medication side effects:  no Medication compliance: good compliance Aspirin: yes Recurrent headaches: no Visual changes: no Palpitations: no Dyspnea: no Chest pain: no Lower extremity edema: baseline, no worsening Dizzy/lightheaded: no  The 10-year ASCVD risk score (Arnett DK, et al., 2019) is: 7%   Values used to calculate the score:     Age: 1 years     Sex: Female     Is Non-Hispanic African American: Yes     Diabetic: No     Tobacco smoker: No     Systolic Blood Pressure: 131 mmHg     Is BP treated: Yes     HDL Cholesterol: 63 mg/dL     Total Cholesterol: 156 mg/dL  PREDIABETES Last Y7C February 6.1%.   Polydipsia/polyuria: no Visual disturbance: no Chest pain: no Paresthesias: no  MGUS + HISTORY OF BREAST CANCER: Follows with oncology, last visit 08/08/22.  She is going to see another Careers adviser, as previous Dr. Shara Blazing retired.  They are monitoring her anemia.  Denies any symptoms or pain today.  She was started on gentle iron by oncology in May due to ongoing low levels.  Is tolerating gentle iron well.  Recent mammogram did note possible cysts -- is repeating in October. Fatigue: no Decreased exercise tolerance: no  Dyspnea on exertion:  occasionally with mowing  lawn Palpitations: no Bleeding: no Pica: no   OSTEOPENIA Last DEXA 06/18/20 = the BMD measured at Femur Neck Right is 0.854 g/cm2 with a T-score of -1.3 -- is scheduled for repeat this October.  Taking Vitamin D and multivitamin daily. Adequate calcium & vitamin D: yes Weight bearing exercises: yes  Adequate calcium & vitamin D: yes Weight bearing exercises: yes   ASTHMA Has Albuterol PRN and Claritin PRN. Asthma status: stable Satisfied with current treatment?: yes Albuterol/rescue inhaler frequency: only a few times yearly Dyspnea frequency: occasional with mowing lawn Wheezing frequency: no Cough frequency: no Nocturnal symptom frequency: none Limitation of activity: no Current upper respiratory symptoms: no Triggers: seasonal Home peak flows: none Last Spirometry: unknown Failed/intolerant to following asthma meds: none Asthma meds in past: none Aerochamber/spacer use: no Visits to ER or Urgent Care in past year: no Pneumovax: Up to Date Influenza: Up to Date  GERD Continues on Nexium as needed. GERD control status: controlled Satisfied with current treatment? yes Heartburn frequency: occasional Medication side effects: no  Medication compliance: stable Previous GERD medications: Pepcid Antacid use frequency:  none Dysphagia: no Odynophagia:  no Hematemesis: no Blood in stool: no EGD: yes  Depression Screen done today and results listed below:     11/04/2022   10:04 AM 05/06/2022    9:39 AM 11/01/2021    1:08 PM 10/01/2020    8:12 AM 04/16/2020  11:20 AM  Depression screen PHQ 2/9  Decreased Interest 0 0 0 0 0  Down, Depressed, Hopeless 0 0 0 0 0  PHQ - 2 Score 0 0 0 0 0  Altered sleeping 1 0 0    Tired, decreased energy 0 1 1    Change in appetite 0 0 0    Feeling bad or failure about yourself  0 0 0    Trouble concentrating 0 0 1    Moving slowly or fidgety/restless 0 0 0    Suicidal thoughts 0 0 0    PHQ-9 Score 1 1 2     Difficult doing work/chores  Not difficult at all Not difficult at all Not difficult at all        11/04/2022   10:05 AM 05/06/2022    9:39 AM 11/01/2021    1:08 PM  GAD 7 : Generalized Anxiety Score  Nervous, Anxious, on Edge 0 1 1  Control/stop worrying 0 0 0  Worry too much - different things 0 0 1  Trouble relaxing 0 0 1  Restless 0 1 0  Easily annoyed or irritable 0 1 1  Afraid - awful might happen 0 0 0  Total GAD 7 Score 0 3 4  Anxiety Difficulty Not difficult at all Not difficult at all Not difficult at all      10/01/2020   10:43 AM 04/01/2021    2:26 PM 11/01/2021    1:07 PM 05/06/2022    9:39 AM 11/04/2022   10:05 AM  Fall Risk  Falls in the past year?   0 0 1  Was there an injury with Fall?   0 0 0  Fall Risk Category Calculator   0 0 1  Fall Risk Category (Retired)   Low    (RETIRED) Patient Fall Risk Level Low fall risk Low fall risk Low fall risk    Patient at Risk for Falls Due to   No Fall Risks No Fall Risks History of fall(s)  Fall risk Follow up   Falls evaluation completed Falls evaluation completed Education provided     Functional Status Survey: Is the patient deaf or have difficulty hearing?: No Does the patient have difficulty seeing, even when wearing glasses/contacts?: No Does the patient have difficulty concentrating, remembering, or making decisions?: No Does the patient have difficulty walking or climbing stairs?: No Does the patient have difficulty dressing or bathing?: No Does the patient have difficulty doing errands alone such as visiting a doctor's office or shopping?: No   Past Medical History:  Past Medical History:  Diagnosis Date   Anemia    Asthma    BRCA negative 09/22/2014   Breast cancer (HCC) 2012   LT LUMPECTOMY   Breast cancer (HCC) 2016   RT LUMPECTOMY   Breast screening, unspecified    Constipation    Dry eyes 2012   Eczema    Family history of breast cancer    Family history of ovarian cancer    Family history of prostate cancer    GERD  (gastroesophageal reflux disease)    Hematuria    Kidney stone    Lump or mass in breast    Malignant neoplasm of breast (female), unspecified site 07/08/2014   Right breast, 5 mm, T1a,N0, triple negative; high risk for recurrence on Mammoprint.   Malignant neoplasm of upper-outer quadrant of female breast (HCC) 05/08/2010   Left breast: T1b, N0, M); triple negative, DCIS present. No chemotherapy. MammoSite radiation.  Neuropathy    pt states in fingertips and feet   Obesity, unspecified    Pancreatic lesion    Personal history of chemotherapy 2016   BREAST CA- Right   Personal history of malignant neoplasm of breast 2012   has been treated with left breast wide excision and radiation therapy; Patient has DCIS as well as a 7 mm infiltrating mammary carcinoma triple negative removed on 05-08-10   Personal history of radiation therapy 2012   BREAST CA - MAMMOSITE- Left   Personal history of radiation therapy 2016   BREAST CA - MAMMOSITE- Right   Screening for obesity    Seasonal allergies    Special screening for malignant neoplasms, colon    Unspecified essential hypertension     Surgical History:  Past Surgical History:  Procedure Laterality Date   BREAST BIOPSY Right 2016   Invasive   BREAST CYST ASPIRATION Left 2010   BREAST LUMPECTOMY Left 2012   BREAST CA   BREAST LUMPECTOMY Right 2016   INVASIVE MAMMARY CARCINOMA   BREAST LUMPECTOMY WITH AXILLARY LYMPH NODE BIOPSY Right 08/01/2014   Procedure: BREAST LUMPECTOMY WITH AXILLARY LYMPH NODE BIOPSY;  Surgeon: Earline Mayotte, MD;  Location: [facility];  Service: General;  Laterality: Right;   BREAST MAMMOSITE  April 2012   mammosite placement and removal    BREAST SURGERY Left 2012   left breast wide excision, sentinel node, MammoSite   BREAST SURGERY Right 08/06/2014   Wide excision/sentinel node biopsy, T1a, N0 triple negative, MammoSite   CESAREAN SECTION  1988   COLONOSCOPY  October 17, 2009   Dr. Niel Hummer, normal  study.   KIDNEY STONE SURGERY  2003   stones removed by Dr. Reuel Boom- stent placed/removed   PORTACATH PLACEMENT Left 11/09/2014   Procedure: INSERTION PORT-A-CATH;  Surgeon: Earline Mayotte, MD;  Location: [facility];  Service: General;  Laterality: Left;   SENTINEL NODE BIOPSY Right 08/01/2014   Procedure: SENTINEL NODE BIOPSY;  Surgeon: Earline Mayotte, MD;  Location: [facility];  Service: General;  Laterality: Right;   TUBAL LIGATION      Medications:  Current Outpatient Medications on File Prior to Visit  Medication Sig   aspirin 81 MG tablet Take 81 mg by mouth daily.   bisacodyl (DULCOLAX) 5 MG EC tablet Take 5 mg by mouth daily as needed for moderate constipation.   Cholecalciferol (VITAMIN D3) 1000 UNITS CAPS Take 1 capsule by mouth daily.    clobetasol (TEMOVATE) 0.05 % external solution Apply 1 application topically daily. Mix bottle in 1 tub of cerave cream and use qd to trunk, arms and legs, avoid face, groin, axilla   Dupilumab (DUPIXENT) 300 MG/2ML SOPN Inject 300 mg into the skin every 14 (fourteen) days. Starting at day 15 for maintenance.   esomeprazole (NEXIUM) 40 MG capsule Take 40 mg by mouth 2 (two) times daily as needed (acid reflux).    loratadine (CLARITIN) 10 MG tablet Take 10 mg by mouth daily as needed. Reported on 04/13/2015   polyethylene glycol (MIRALAX / GLYCOLAX) packet Take 17 g by mouth every other day.   potassium chloride SA (KLOR-CON M) 20 MEQ tablet TAKE 1 TABLET BY MOUTH EVERY DAY   tacrolimus (PROTOPIC) 0.1 % ointment Spot treat affected areas on skin once or twice a day   Turmeric 500 MG CAPS Take 500 mg by mouth daily.   Current Facility-Administered Medications on File Prior to Visit  Medication   sodium chloride 0.9 % injection 10 mL  Allergies:  Allergies  Allergen Reactions   Cayenne Pepper [Cayenne] Shortness Of Breath   Shellfish Allergy Other (See Comments)    Hives and swelling on ears and feet   Tape Rash    Surgical tape from breast  biopsy    Social History:  Social History   Socioeconomic History   Marital status: Single    Spouse name: Not on file   Number of children: Not on file   Years of education: Not on file   Highest education level: Not on file  Occupational History   Not on file  Tobacco Use   Smoking status: Never   Smokeless tobacco: Never  Vaping Use   Vaping status: Never Used  Substance and Sexual Activity   Alcohol use: Yes    Comment: occasionally   Drug use: No   Sexual activity: Yes  Other Topics Concern   Not on file  Social History Narrative   Not on file   Social Determinants of Health   Financial Resource Strain: Not on file  Food Insecurity: Not on file  Transportation Needs: Not on file  Physical Activity: Not on file  Stress: Not on file  Social Connections: Not on file  Intimate Partner Violence: Not on file   Social History   Tobacco Use  Smoking Status Never  Smokeless Tobacco Never   Social History   Substance and Sexual Activity  Alcohol Use Yes   Comment: occasionally    Family History:  Family History  Problem Relation Age of Onset   Ovarian cancer Mother    Diabetes Mother    Diabetes Father    Prostate cancer Father        dx in his 10s   Hypertension Brother    Cancer Maternal Grandmother        bone   Cancer Maternal Grandfather        lung   Cancer Paternal Grandmother        lung   Asthma Paternal Grandfather    Hypertension Sister    Prostate cancer Brother 21   AAA (abdominal aortic aneurysm) Maternal Uncle    Breast cancer Other    Past medical history, surgical history, medications, allergies, family history and social history reviewed with patient today and changes made to appropriate areas of the chart.   Review of Systems - negative All other ROS negative except what is listed above and in the HPI.      Objective:    BP 131/81   Pulse 61   Temp 97.7 F (36.5 C)   Ht 5' 5.16" (1.655 m)   Wt 201 lb 12.8 oz (91.5 kg)    LMP  (LMP Unknown)   BMI 33.42 kg/m   Wt Readings from Last 3 Encounters:  11/04/22 201 lb 12.8 oz (91.5 kg)  08/08/22 202 lb (91.6 kg)  05/06/22 197 lb 11.2 oz (89.7 kg)    Physical Exam Vitals and nursing note reviewed.  Constitutional:      General: She is awake. She is not in acute distress.    Appearance: She is well-developed. She is not ill-appearing.  HENT:     Head: Normocephalic and atraumatic.     Right Ear: Hearing, tympanic membrane, ear canal and external ear normal. No drainage.     Left Ear: Hearing, tympanic membrane, ear canal and external ear normal. No drainage.     Nose: Nose normal.     Right Sinus: No maxillary sinus tenderness or frontal  sinus tenderness.     Left Sinus: No maxillary sinus tenderness or frontal sinus tenderness.     Mouth/Throat:     Mouth: Mucous membranes are moist.     Pharynx: Oropharynx is clear. Uvula midline. No pharyngeal swelling, oropharyngeal exudate or posterior oropharyngeal erythema.  Eyes:     General: Lids are normal.        Right eye: No discharge.        Left eye: No discharge.     Extraocular Movements: Extraocular movements intact.     Conjunctiva/sclera: Conjunctivae normal.     Pupils: Pupils are equal, round, and reactive to light.     Visual Fields: Right eye visual fields normal and left eye visual fields normal.  Neck:     Thyroid: No thyromegaly.     Vascular: No carotid bruit.     Trachea: Trachea normal.  Cardiovascular:     Rate and Rhythm: Normal rate and regular rhythm.     Heart sounds: Normal heart sounds. No murmur heard.    No gallop.  Pulmonary:     Effort: Pulmonary effort is normal. No accessory muscle usage or respiratory distress.     Breath sounds: Normal breath sounds.  Chest:     Comments: Deferred due to recent exams. Abdominal:     General: Bowel sounds are normal.     Palpations: Abdomen is soft. There is no hepatomegaly or splenomegaly.     Tenderness: There is no abdominal  tenderness.  Genitourinary:    Vagina: Normal.     Cervix: Normal.     Uterus: Normal.      Adnexa: Right adnexa normal.     Rectum: Normal.  Musculoskeletal:        General: Normal range of motion.     Cervical back: Normal range of motion and neck supple.     Right lower leg: No edema.     Left lower leg: No edema.  Lymphadenopathy:     Head:     Right side of head: No submental, submandibular, tonsillar, preauricular or posterior auricular adenopathy.     Left side of head: No submental, submandibular, tonsillar, preauricular or posterior auricular adenopathy.     Cervical: No cervical adenopathy.  Skin:    General: Skin is warm and dry.     Capillary Refill: Capillary refill takes less than 2 seconds.     Findings: No rash.  Neurological:     Mental Status: She is alert and oriented to person, place, and time.     Gait: Gait is intact.     Deep Tendon Reflexes: Reflexes are normal and symmetric.     Reflex Scores:      Brachioradialis reflexes are 2+ on the right side and 2+ on the left side.      Patellar reflexes are 2+ on the right side and 2+ on the left side. Psychiatric:        Attention and Perception: Attention normal.        Mood and Affect: Mood normal.        Speech: Speech normal.        Behavior: Behavior normal. Behavior is cooperative.        Thought Content: Thought content normal.        Judgment: Judgment normal.    Results for orders placed or performed in visit on 08/08/22  Vitamin D 25 hydroxy  Result Value Ref Range   Vit D, 25-Hydroxy 39.00 30 - 100 ng/mL  Folate  Result Value Ref Range   Folate >40.0 >5.9 ng/mL  Kappa/lambda light chains  Result Value Ref Range   Kappa free light chain 40.7 (H) 3.3 - 19.4 mg/L   Lambda free light chains 16.2 5.7 - 26.3 mg/L   Kappa, lambda light chain ratio 2.51 (H) 0.26 - 1.65  Multiple Myeloma Panel (SPEP&IFE w/QIG)  Result Value Ref Range   IgG (Immunoglobin G), Serum 2,065 (H) 586 - 1,602 mg/dL   IgA  811 87 - 914 mg/dL   IgM (Immunoglobulin M), Srm 49 26 - 217 mg/dL   Total Protein ELP 7.4 6.0 - 8.5 g/dL   Albumin SerPl Elph-Mcnc 3.5 2.9 - 4.4 g/dL   Alpha 1 0.3 0.0 - 0.4 g/dL   Alpha2 Glob SerPl Elph-Mcnc 0.9 0.4 - 1.0 g/dL   B-Globulin SerPl Elph-Mcnc 1.1 0.7 - 1.3 g/dL   Gamma Glob SerPl Elph-Mcnc 1.6 0.4 - 1.8 g/dL   M Protein SerPl Elph-Mcnc 1.1 (H) Not Observed g/dL   Globulin, Total 3.9 2.2 - 3.9 g/dL   Albumin/Glob SerPl 0.9 0.7 - 1.7   IFE 1 Comment (A)    Please Note Comment   Cancer antigen 27.29  Result Value Ref Range   CA 27.29 16.8 0.0 - 38.6 U/mL  Lactate dehydrogenase  Result Value Ref Range   LDH 164 98 - 192 U/L  Vitamin B12  Result Value Ref Range   Vitamin B-12 1,299 (H) 180 - 914 pg/mL  Comprehensive metabolic panel  Result Value Ref Range   Sodium 139 135 - 145 mmol/L   Potassium 3.8 3.5 - 5.1 mmol/L   Chloride 106 98 - 111 mmol/L   CO2 27 22 - 32 mmol/L   Glucose, Bld 102 (H) 70 - 99 mg/dL   BUN 24 (H) 8 - 23 mg/dL   Creatinine, Ser 7.82 0.44 - 1.00 mg/dL   Calcium 9.5 8.9 - 95.6 mg/dL   Total Protein 8.1 6.5 - 8.1 g/dL   Albumin 3.8 3.5 - 5.0 g/dL   AST 20 15 - 41 U/L   ALT 19 0 - 44 U/L   Alkaline Phosphatase 84 38 - 126 U/L   Total Bilirubin 0.6 0.3 - 1.2 mg/dL   GFR, Estimated >21 >30 mL/min   Anion gap 6 5 - 15  CBC with Differential  Result Value Ref Range   WBC 7.3 4.0 - 10.5 K/uL   RBC 3.03 (L) 3.87 - 5.11 MIL/uL   Hemoglobin 9.7 (L) 12.0 - 15.0 g/dL   HCT 86.5 (L) 78.4 - 69.6 %   MCV 96.7 80.0 - 100.0 fL   MCH 32.0 26.0 - 34.0 pg   MCHC 33.1 30.0 - 36.0 g/dL   RDW 29.5 28.4 - 13.2 %   Platelets 238 150 - 400 K/uL   nRBC 0.0 0.0 - 0.2 %   Neutrophils Relative % 52 %   Neutro Abs 3.8 1.7 - 7.7 K/uL   Lymphocytes Relative 36 %   Lymphs Abs 2.6 0.7 - 4.0 K/uL   Monocytes Relative 6 %   Monocytes Absolute 0.5 0.1 - 1.0 K/uL   Eosinophils Relative 5 %   Eosinophils Absolute 0.3 0.0 - 0.5 K/uL   Basophils Relative 1 %    Basophils Absolute 0.0 0.0 - 0.1 K/uL   Immature Granulocytes 0 %   Abs Immature Granulocytes 0.03 0.00 - 0.07 K/uL      Assessment & Plan:   Problem List Items Addressed This Visit  Cardiovascular and Mediastinum   Hypertension    Chronic, stable.  BP at goal for age at home and in office, lower levels at home.  Continue current medication regimen and adjust as needed.  Recommend she continue to monitor BP regularly at home and document + focus on [name] diet.  LABS: CBC, CMP, TSH.  Refills sent in.  Return to office in 6 months.      Relevant Medications   lisinopril (ZESTRIL) 10 MG tablet   atorvastatin (LIPITOR) 10 MG tablet   Other Relevant Orders   CBC with Differential/Platelet   Comprehensive metabolic panel   TSH     Respiratory   Asthma    Chronic, stable with minimal use of inhaler.  Continue current medication regimen and adjust as needed.  Spirometry in new year.      Relevant Medications   albuterol (VENTOLIN HFA) 108 (90 Base) MCG/ACT inhaler     Digestive   GERD without esophagitis    Chronic, stable with minimal use of Nexium.  Recommend she continue this only as needed.  Mag level today.      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  Has had side effects with Lyrica and Gabapentin, extreme fatigue.  Check Mag level and CBC.        Musculoskeletal and Integument   Osteopenia of neck of right femur    Noted on DEXA 06/18/20 -- at this time continue supplements daily and educated patient at length on finding.  Repeat DEXA ordered for October, will review.  Check Vitamin D today and continue supplement.        Other   History of breast cancer    Continue to collaborate with oncology and review notes.  Mammogram in October next.      Hyperlipemia, mixed   Relevant Medications   lisinopril (ZESTRIL) 10 MG tablet    atorvastatin (LIPITOR) 10 MG tablet   Other Relevant Orders   Comprehensive metabolic panel   Lipid Panel w/o Chol/HDL Ratio   Low hemoglobin    Monitored by oncology--recent labs reviewed.  Continue gentle iron at home, as ordered by oncology.      MGUS (monoclonal gammopathy of unknown significance)    Chronic, ongoing.  Continue collaboration with oncology, recent notes and labs reviewed.  Patient denies any pain today.      Obesity    BMI 33.42, maintaining loss.  Recommended eating smaller high protein, low fat meals more frequently and exercising 30 mins a day 5 times a week with a goal of 10-15lb weight loss in the next 3 months. Patient voiced their understanding and motivation to adhere to these recommendations.       Prediabetes    Noted on past labs, recheck A1c today and continue diet focus.  Initiate medications as needed.  A1c 6.1% last visit, mild trend down from 6.2% prior and urine ALB 30 February 2024. Continue Lisinopril for kidney protection.      Relevant Orders   HgB A1c   Vitamin D deficiency    Chronic, stable with recent levels at goal.  Continue supplement and check level today.      Relevant Orders   VITAMIN D 25 Hydroxy (Vit-D Deficiency, Fractures)   Other Visit Diagnoses     Need for influenza vaccination       Flu vaccine provided  today after consent from patient.   Relevant Orders   Flu vaccine trivalent PF, 6mos and older(Flulaval,Afluria,Fluarix,Fluzone) (Completed)   Encounter for annual physical exam       Annual physical today with labs and health maintenance reviewed, discussed with patient.        Follow up plan: Return in about 6 months (around 05/07/2023) for HTN/HLD, H/O BREAST CANCER & MGUS, OSTEOPENIA - need spirometry.   LABORATORY TESTING:  - Pap smear: Up To Date  IMMUNIZATIONS:   - Tdap: Tetanus vaccination status reviewed: last tetanus booster within 10 years. - Influenza: Was provided today - Pneumovax: Up to date -  Prevnar: Not applicable - HPV: Not applicable - Zostavax vaccine: Up to date  SCREENING: -Mammogram: Up to date -- she is going in October - Colonoscopy: Up to date  - Bone Density: Up To Date - she is going in October -Hearing Test: Not applicable  -Spirometry: Not applicable   PATIENT COUNSELING:   Advised to take 1 mg of folate supplement per day if capable of pregnancy.   Sexuality: Discussed sexually transmitted diseases, partner selection, use of condoms, avoidance of unintended pregnancy  and contraceptive alternatives.   Advised to avoid cigarette smoking.  I discussed with the patient that most people either abstain from alcohol or drink within safe limits (<=14/week and <=4 drinks/occasion for males, <=7/weeks and <= 3 drinks/occasion for females) and that the risk for alcohol disorders and other health effects rises proportionally with the number of drinks per week and how often a drinker exceeds daily limits.  Discussed cessation/primary prevention of drug use and availability of treatment for abuse.   Diet: Encouraged to adjust caloric intake to maintain  or achieve ideal body weight, to reduce intake of dietary saturated fat and total fat, to limit sodium intake by avoiding high sodium foods and not adding table salt, and to maintain adequate dietary potassium and calcium preferably from fresh fruits, vegetables, and low-fat dairy products.    Stressed the importance of regular exercise  Injury prevention: Discussed safety belts, safety helmets, smoke detector, smoking near bedding or upholstery.   Dental health: Discussed importance of regular tooth brushing, flossing, and dental visits.    NEXT PREVENTATIVE PHYSICAL DUE IN 1 YEAR. Return in about 6 months (around 05/07/2023) for HTN/HLD, H/O BREAST CANCER & MGUS, OSTEOPENIA - need spirometry.

## 2022-11-04 NOTE — Assessment & Plan Note (Signed)
Monitored by oncology--recent labs reviewed.  Continue gentle iron at home, as ordered by oncology.

## 2022-11-04 NOTE — Assessment & Plan Note (Signed)
Chronic, ongoing.  Continue collaboration with oncology, recent notes and labs reviewed.  Patient denies any pain today.

## 2022-11-04 NOTE — Assessment & Plan Note (Signed)
Chronic, stable without medication at this time.  Has had side effects with Lyrica and Gabapentin, extreme fatigue.  Check Mag level and CBC.

## 2022-11-04 NOTE — Assessment & Plan Note (Signed)
BMI 33.42, maintaining loss.  Recommended eating smaller high protein, low fat meals more frequently and exercising 30 mins a day 5 times a week with a goal of 10-15lb weight loss in the next 3 months. Patient voiced their understanding and motivation to adhere to these recommendations.

## 2022-11-04 NOTE — Assessment & Plan Note (Signed)
Continue to collaborate with oncology and review notes.  Mammogram in October next.

## 2022-11-05 LAB — CBC WITH DIFFERENTIAL/PLATELET
Basophils Absolute: 0 10*3/uL (ref 0.0–0.2)
Basos: 0 %
EOS (ABSOLUTE): 0.2 10*3/uL (ref 0.0–0.4)
Eos: 2 %
Hematocrit: 31.3 % — ABNORMAL LOW (ref 34.0–46.6)
Hemoglobin: 10.4 g/dL — ABNORMAL LOW (ref 11.1–15.9)
Immature Grans (Abs): 0 10*3/uL (ref 0.0–0.1)
Immature Granulocytes: 0 %
Lymphocytes Absolute: 2.2 10*3/uL (ref 0.7–3.1)
Lymphs: 32 %
MCH: 32.3 pg (ref 26.6–33.0)
MCHC: 33.2 g/dL (ref 31.5–35.7)
MCV: 97 fL (ref 79–97)
Monocytes Absolute: 0.5 10*3/uL (ref 0.1–0.9)
Monocytes: 7 %
Neutrophils Absolute: 4 10*3/uL (ref 1.4–7.0)
Neutrophils: 59 %
Platelets: 246 10*3/uL (ref 150–450)
RBC: 3.22 x10E6/uL — ABNORMAL LOW (ref 3.77–5.28)
RDW: 13.7 % (ref 11.7–15.4)
WBC: 6.9 10*3/uL (ref 3.4–10.8)

## 2022-11-05 LAB — LIPID PANEL W/O CHOL/HDL RATIO
Cholesterol, Total: 150 mg/dL (ref 100–199)
HDL: 66 mg/dL (ref >39)
LDL Chol Calc (NIH): 74 mg/dL (ref 0–99)
Triglycerides: 44 mg/dL (ref 0–149)
VLDL Cholesterol Cal: 10 mg/dL (ref 5–40)

## 2022-11-05 LAB — MAGNESIUM: Magnesium: 1.8 mg/dL (ref 1.6–2.3)

## 2022-11-05 LAB — VITAMIN D 25 HYDROXY (VIT D DEFICIENCY, FRACTURES): Vit D, 25-Hydroxy: 49.3 ng/mL (ref 30.0–100.0)

## 2022-11-05 LAB — COMPREHENSIVE METABOLIC PANEL
ALT: 17 IU/L (ref 0–32)
AST: 20 IU/L (ref 0–40)
Albumin: 4 g/dL (ref 3.9–4.9)
Alkaline Phosphatase: 92 IU/L (ref 44–121)
BUN/Creatinine Ratio: 18 (ref 12–28)
BUN: 18 mg/dL (ref 8–27)
Bilirubin Total: 0.7 mg/dL (ref 0.0–1.2)
CO2: 24 mmol/L (ref 20–29)
Calcium: 9.6 mg/dL (ref 8.7–10.3)
Chloride: 103 mmol/L (ref 96–106)
Creatinine, Ser: 1.02 mg/dL — ABNORMAL HIGH (ref 0.57–1.00)
Globulin, Total: 3.3 g/dL (ref 1.5–4.5)
Glucose: 95 mg/dL (ref 70–99)
Potassium: 3.9 mmol/L (ref 3.5–5.2)
Sodium: 139 mmol/L (ref 134–144)
Total Protein: 7.3 g/dL (ref 6.0–8.5)
eGFR: 61 mL/min/{1.73_m2} (ref >59)

## 2022-11-05 LAB — HEMOGLOBIN A1C
Est. average glucose Bld gHb Est-mCnc: 131 mg/dL
Hgb A1c MFr Bld: 6.2 % — ABNORMAL HIGH (ref 4.8–5.6)

## 2022-11-05 LAB — TSH: TSH: 2.46 u[IU]/mL (ref 0.450–4.500)

## 2022-11-05 NOTE — Progress Notes (Signed)
Contacted via MyChart   Good afternoon Adrienne [name], your labs have returned: - CBC continues to show lower hemoglobin and hematocrit, but this is trending up, continue iron supplement. - Kidney function, creatinine and eGFR, remains normal, as is liver function, AST and ALT.  - A1c remains 6.2% and still in prediabetic range -- continue to work on diet and regular exercise. - Remainder of labs look great.  Any questions?  Continue all medications. Keep being amazing!!  Thank you for allowing me to participate in your care.  I appreciate you. Kindest regards, Jasslyn Finkel

## 2022-11-16 ENCOUNTER — Other Ambulatory Visit: Payer: Self-pay | Admitting: Dermatology

## 2022-11-16 DIAGNOSIS — L2081 Atopic neurodermatitis: Secondary | ICD-10-CM

## 2022-12-25 ENCOUNTER — Ambulatory Visit
Admission: RE | Admit: 2022-12-25 | Discharge: 2022-12-25 | Disposition: A | Discharge status: Home / Self Care | Payer: Managed Care, Other (non HMO) | Source: Ambulatory Visit | Attending: Internal Medicine | Admitting: Internal Medicine

## 2022-12-25 DIAGNOSIS — N63 Unspecified lump in unspecified breast: Secondary | ICD-10-CM | POA: Diagnosis present

## 2022-12-25 DIAGNOSIS — Z853 Personal history of malignant neoplasm of breast: Secondary | ICD-10-CM | POA: Diagnosis present

## 2022-12-25 DIAGNOSIS — D472 Monoclonal gammopathy: Secondary | ICD-10-CM | POA: Diagnosis present

## 2022-12-25 DIAGNOSIS — E559 Vitamin D deficiency, unspecified: Secondary | ICD-10-CM

## 2022-12-25 DIAGNOSIS — R928 Other abnormal and inconclusive findings on diagnostic imaging of breast: Secondary | ICD-10-CM | POA: Insufficient documentation

## 2023-01-16 ENCOUNTER — Inpatient Hospital Stay: Payer: Managed Care, Other (non HMO) | Attending: Internal Medicine

## 2023-01-16 ENCOUNTER — Other Ambulatory Visit: Payer: Self-pay

## 2023-01-16 DIAGNOSIS — E669 Obesity, unspecified: Secondary | ICD-10-CM | POA: Insufficient documentation

## 2023-01-16 DIAGNOSIS — D472 Monoclonal gammopathy: Secondary | ICD-10-CM | POA: Diagnosis present

## 2023-01-16 DIAGNOSIS — E876 Hypokalemia: Secondary | ICD-10-CM | POA: Diagnosis not present

## 2023-01-16 DIAGNOSIS — Z853 Personal history of malignant neoplasm of breast: Secondary | ICD-10-CM | POA: Diagnosis present

## 2023-01-16 DIAGNOSIS — D649 Anemia, unspecified: Secondary | ICD-10-CM | POA: Insufficient documentation

## 2023-01-16 DIAGNOSIS — M8580 Other specified disorders of bone density and structure, unspecified site: Secondary | ICD-10-CM | POA: Diagnosis not present

## 2023-01-16 DIAGNOSIS — Z08 Encounter for follow-up examination after completed treatment for malignant neoplasm: Secondary | ICD-10-CM | POA: Insufficient documentation

## 2023-01-16 DIAGNOSIS — G629 Polyneuropathy, unspecified: Secondary | ICD-10-CM | POA: Insufficient documentation

## 2023-01-16 LAB — CBC WITH DIFFERENTIAL (CANCER CENTER ONLY)
Abs Immature Granulocytes: 0.02 10*3/uL (ref 0.00–0.07)
Basophils Absolute: 0 10*3/uL (ref 0.0–0.1)
Basophils Relative: 0 %
Eosinophils Absolute: 0.1 10*3/uL (ref 0.0–0.5)
Eosinophils Relative: 2 %
HCT: 30.4 % — ABNORMAL LOW (ref 36.0–46.0)
Hemoglobin: 10.1 g/dL — ABNORMAL LOW (ref 12.0–15.0)
Immature Granulocytes: 0 %
Lymphocytes Relative: 29 %
Lymphs Abs: 2 10*3/uL (ref 0.7–4.0)
MCH: 32.1 pg (ref 26.0–34.0)
MCHC: 33.2 g/dL (ref 30.0–36.0)
MCV: 96.5 fL (ref 80.0–100.0)
Monocytes Absolute: 0.5 10*3/uL (ref 0.1–1.0)
Monocytes Relative: 7 %
Neutro Abs: 4.3 10*3/uL (ref 1.7–7.7)
Neutrophils Relative %: 62 %
Platelet Count: 233 10*3/uL (ref 150–400)
RBC: 3.15 MIL/uL — ABNORMAL LOW (ref 3.87–5.11)
RDW: 14.6 % (ref 11.5–15.5)
WBC Count: 7 10*3/uL (ref 4.0–10.5)
nRBC: 0 % (ref 0.0–0.2)

## 2023-01-16 LAB — CMP (CANCER CENTER ONLY)
ALT: 22 U/L (ref 0–44)
AST: 22 U/L (ref 15–41)
Albumin: 3.7 g/dL (ref 3.5–5.0)
Alkaline Phosphatase: 79 U/L (ref 38–126)
Anion gap: 5 (ref 5–15)
BUN: 22 mg/dL (ref 8–23)
CO2: 25 mmol/L (ref 22–32)
Calcium: 9.2 mg/dL (ref 8.9–10.3)
Chloride: 106 mmol/L (ref 98–111)
Creatinine: 1 mg/dL (ref 0.44–1.00)
GFR, Estimated: >60 mL/min (ref >60)
Glucose, Bld: 112 mg/dL — ABNORMAL HIGH (ref 70–99)
Potassium: 3.7 mmol/L (ref 3.5–5.1)
Sodium: 136 mmol/L (ref 135–145)
Total Bilirubin: 0.6 mg/dL (ref <1.2)
Total Protein: 7.6 g/dL (ref 6.5–8.1)

## 2023-01-16 LAB — LACTATE DEHYDROGENASE: LDH: 137 U/L (ref 98–192)

## 2023-01-16 LAB — VITAMIN D 25 HYDROXY (VIT D DEFICIENCY, FRACTURES): Vit D, 25-Hydroxy: 51.91 ng/mL (ref 30–100)

## 2023-01-16 LAB — VITAMIN B12: Vitamin B-12: 861 pg/mL (ref 180–914)

## 2023-01-16 LAB — FOLATE: Folate: >40 ng/mL

## 2023-01-17 LAB — CANCER ANTIGEN 27.29: CA 27.29: 13.9 U/mL (ref 0.0–38.6)

## 2023-01-18 ENCOUNTER — Encounter: Payer: Self-pay | Admitting: Nurse Practitioner

## 2023-01-19 LAB — KAPPA/LAMBDA LIGHT CHAINS
Kappa free light chain: 35.8 mg/L — ABNORMAL HIGH (ref 3.3–19.4)
Kappa, lambda light chain ratio: 2.78 — ABNORMAL HIGH (ref 0.26–1.65)
Lambda free light chains: 12.9 mg/L (ref 5.7–26.3)

## 2023-01-21 LAB — MULTIPLE MYELOMA PANEL, SERUM
Albumin SerPl Elph-Mcnc: 3.8 g/dL (ref 2.9–4.4)
Albumin/Glob SerPl: 1.2 (ref 0.7–1.7)
Alpha 1: 0.2 g/dL (ref 0.0–0.4)
Alpha2 Glob SerPl Elph-Mcnc: 0.8 g/dL (ref 0.4–1.0)
B-Globulin SerPl Elph-Mcnc: 0.8 g/dL (ref 0.7–1.3)
Gamma Glob SerPl Elph-Mcnc: 1.5 g/dL (ref 0.4–1.8)
Globulin, Total: 3.3 g/dL (ref 2.2–3.9)
IgA: 143 mg/dL (ref 87–352)
IgG (Immunoglobin G), Serum: 1882 mg/dL — ABNORMAL HIGH (ref 586–1602)
IgM (Immunoglobulin M), Srm: 48 mg/dL (ref 26–217)
M Protein SerPl Elph-Mcnc: 0.8 g/dL — ABNORMAL HIGH
Total Protein ELP: 7.1 g/dL (ref 6.0–8.5)

## 2023-01-30 ENCOUNTER — Inpatient Hospital Stay: Payer: Managed Care, Other (non HMO) | Admitting: Internal Medicine

## 2023-01-30 ENCOUNTER — Encounter: Payer: Self-pay | Admitting: Internal Medicine

## 2023-01-30 VITALS — BP 114/69 | HR 88 | Temp 97.0°F | Wt 204.0 lb

## 2023-01-30 DIAGNOSIS — D472 Monoclonal gammopathy: Secondary | ICD-10-CM

## 2023-01-30 DIAGNOSIS — Z08 Encounter for follow-up examination after completed treatment for malignant neoplasm: Secondary | ICD-10-CM | POA: Diagnosis not present

## 2023-01-30 DIAGNOSIS — Z853 Personal history of malignant neoplasm of breast: Secondary | ICD-10-CM | POA: Diagnosis not present

## 2023-01-30 NOTE — Assessment & Plan Note (Addendum)
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  Overall stable except for mild anemia see below.  No evidence of renal function or hypercalcemia.   # chronic mild anemia-unclear etiology [BL  9-10]; However today Hb 10.2  overaall stable; JAN 2024- ironstudies-13 /ferritin-103.   It is clinically unlikely the patient anemia is related to her underlying monoclonal gammopathy.#Recommend gentle iron [iron biglycinate; 28 mg ] 1 pill a day.  This pill is unlikely to cause stomach upset or cause constipation.   # Chronic mild hypokalemia  [left leg cramps]-today potassium is 3.8.  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  Clinically no evidence of recurrence; see below re: mammogram  # OCT 2024- bil Mammo- [CC] Multiple probably benign masses within the left breast, favored to represent complicated cysts. Given the imaging appearance on mammography as well, a follow-up left breast diagnostic mammogram and ultrasound is recommended in 6 months/ OCT 2024- s/p  Dr.Cintron:  # Peripheral neuropathy- sec to chemo. grade 1-2- stable  #Osteopenia : Reviewed the bone density  -OCT 2024- The BMD -  T-score of -1.7. continue VitD. [hx of falls]; recommend resistance training [pt currently on @ home/videos] - check BMD in oct 2026  stable  # Obesity- recommend continued weight loss efforts  # DISPOSITION:  # BIL Diagnostic mammogram in April 2025.  # follow up in 6 months-2 weeks prior- MD Vickie Epley- cbc/cmp; b12; folic acid; LDH-/ca-27-29/MM panel;  K-L panel; vit D -OH levels- Dr.B

## 2023-01-30 NOTE — Addendum Note (Signed)
Addended by: Darrold Span A on: 01/30/2023 11:27 AM   Modules accepted: Orders

## 2023-01-30 NOTE — Progress Notes (Signed)
[facility] OFFICE PROGRESS NOTE  Patient Care Team: Marjie Skiff, NP as PCP - General (Nurse Practitioner) Lemar Livings Merrily Pew, MD (General Surgery) Earna Coder, MD as Consulting Physician (Internal Medicine)   SUMMARY OF ONCOLOGIC HISTORY:  Oncology History Overview Note  # MAY 2016- RIGHT BREAST CANCER- Triple negative [T=0.5cm;G-3; margins-Neg]; Mammaprint- 0.814/molecular basal type [5 year risk- 22%; 10 year risk-29%] AC q3 W- taxol q w x 8/ 12 [sec to PN; finished Feb 2017].   # 2012 LEFT BREAST CA STAGE I s/p Lumpec [T1 (0.7CM);Triple Neg; G-3]; s/p mammosite RT; No adj chemo  #  Mild Anemia/M-protein 0.8gm/dl [sep 4270]  # BRCA- 1&2 NEGATIVE [july 2016]; s/p Genetic counseling [June 2020]  # PN s/p acupuncture  --------------------------------------------------     DIAGNOSIS: breast cancer  STAGE:   I      ;GOALS: cure  CURRENT/MOST RECENT THERAPY: surveillaince     History of breast cancer  08/31/2018 Genetic Testing   Two VUS identified on the CustomNext-Cancer+RNAinsight.  CFTR p.R450I and CTNNA1 p.L402S VUS.  The CustomNext-Expanded gene panel offered by [facility] and includes sequencing and rearrangement analysis for the following 81 genes: AIP, ALK, APC*, ATM*, AXIN2, BAP1, BARD1, BLM, BMPR1A, BRCA1*, BRCA2*, BRIP1*, CDC73, CDH1*, CDK4, CDKN1B, CDKN2A, CHEK2*, CTNNA1, DICER1, FANCC, FH, FLCN, GALNT12, HOXB13, KIT, MAX, MEN1, MET, MLH1*, MRE11A, MSH2*, MSH6*, MUTYH*, NBN, NF1*, NF2, NTHL1, PALB2*, PDGFRA, PHOX2B, PMS2*, POLD1, POLE, POT1, PRKAR1A, PTCH1, PTEN*, RAD50, RAD51C*, RAD51D*, RB1, RET, SDHA, SDHAF2, SDHB, SDHC, SDHD, SMAD4, SMARCA4, SMARCB1, SMARCE1, STK11, SUFU, TMEM127, TP53*, TSC1, TSC2, VHL and XRCC2 (sequencing and deletion/duplication); CASR, CFTR, CPA1, CTRC, EGFR, MITF, PRSS1 and SPINK1 (sequencing only); EPCAM and GREM1 (deletion/duplication only). DNA and RNA analyses performed for * genes. The report date is September 01, 2018.     INTERVAL HISTORY: Alone. Ambulating independently.  A pleasant 62 year old female patient with above history of stage I right-sided triple negative breast cancer; MGUS- Is here for follow-up/bone density last mammogram.  Patient evaluated by surgery Dr. Maia Plan.   Denies any swelling.  Chronic tingling and numbness not any worse.  Appetite is good.  No weight loss.  Denies any new lumps or bumps.   No worsening back pain.  Review of Systems  Constitutional:  Negative for chills, diaphoresis, fever, malaise/fatigue and weight loss.  HENT:  Negative for nosebleeds and sore throat.   Eyes:  Negative for double vision.  Respiratory:  Negative for cough, hemoptysis, sputum production, shortness of breath and wheezing.   Cardiovascular:  Negative for chest pain, palpitations, orthopnea and leg swelling.  Gastrointestinal:  Negative for abdominal pain, blood in stool, constipation, diarrhea, heartburn, melena, nausea and vomiting.  Genitourinary:  Negative for dysuria, frequency and urgency.  Musculoskeletal:  Positive for joint pain. Negative for back pain.  Skin: Negative.  Negative for itching and rash.  Neurological:  Positive for tingling. Negative for dizziness, focal weakness, weakness and headaches.  Endo/Heme/Allergies:  Does not bruise/bleed easily.  Psychiatric/Behavioral:  Negative for depression. The patient is not nervous/anxious and does not have insomnia.      PAST MEDICAL HISTORY :  Past Medical History:  Diagnosis Date   Anemia    Asthma    BRCA negative 09/22/2014   Breast cancer (HCC) 2012   LT LUMPECTOMY   Breast cancer (HCC) 2016   RT LUMPECTOMY   Breast screening, unspecified    Constipation    Dry eyes 2012   Eczema    Family history of  breast cancer    Family history of ovarian cancer    Family history of prostate cancer    GERD (gastroesophageal reflux disease)    Hematuria    Kidney stone    Lump or mass in breast    Malignant  neoplasm of breast (female), unspecified site 07/08/2014   Right breast, 5 mm, T1a,N0, triple negative; high risk for recurrence on Mammoprint.   Malignant neoplasm of upper-outer quadrant of female breast (HCC) 05/08/2010   Left breast: T1b, N0, M); triple negative, DCIS present. No chemotherapy. MammoSite radiation.   Neuropathy    pt states in fingertips and feet   Obesity, unspecified    Pancreatic lesion    Personal history of chemotherapy 2016   BREAST CA- Right   Personal history of malignant neoplasm of breast 2012   has been treated with left breast wide excision and radiation therapy; Patient has DCIS as well as a 7 mm infiltrating mammary carcinoma triple negative removed on 05-08-10   Personal history of radiation therapy 2012   BREAST CA - MAMMOSITE- Left   Personal history of radiation therapy 2016   BREAST CA - MAMMOSITE- Right   Screening for obesity    Seasonal allergies    Special screening for malignant neoplasms, colon    Unspecified essential hypertension     PAST SURGICAL HISTORY :   Past Surgical History:  Procedure Laterality Date   BREAST BIOPSY Right 2016   Invasive   BREAST CYST ASPIRATION Left 2010   BREAST LUMPECTOMY Left 2012   BREAST CA   BREAST LUMPECTOMY Right 2016   INVASIVE MAMMARY CARCINOMA   BREAST LUMPECTOMY WITH AXILLARY LYMPH NODE BIOPSY Right 08/01/2014   Procedure: BREAST LUMPECTOMY WITH AXILLARY LYMPH NODE BIOPSY;  Surgeon: Earline Mayotte, MD;  Location: [facility] ORS;  Service: General;  Laterality: Right;   BREAST MAMMOSITE  April 2012   mammosite placement and removal    BREAST SURGERY Left 2012   left breast wide excision, sentinel node, MammoSite   BREAST SURGERY Right 08/06/2014   Wide excision/sentinel node biopsy, T1a, N0 triple negative, MammoSite   CESAREAN SECTION  1988   COLONOSCOPY  October 17, 2009   Dr. Niel Hummer, normal study.   KIDNEY STONE SURGERY  2003   stones removed by Dr. Reuel Boom- stent placed/removed   PORTACATH  PLACEMENT Left 11/09/2014   Procedure: INSERTION PORT-A-CATH;  Surgeon: Earline Mayotte, MD;  Location: [facility] ORS;  Service: General;  Laterality: Left;   SENTINEL NODE BIOPSY Right 08/01/2014   Procedure: SENTINEL NODE BIOPSY;  Surgeon: Earline Mayotte, MD;  Location: [facility] OR[facility];  Service: General;  Laterality: Right;   TUBAL LIGATION      FAMILY HISTORY :   Family History  Problem Relation Age of Onset   Ovarian cancer Mother    Diabetes Mother    Diabetes Father    Prostate cancer Father        dx in his 82s   Hypertension Brother    Cancer Maternal Grandmother        bone   Cancer Maternal Grandfather        lung   Cancer Paternal Grandmother        lung   Asthma Paternal Grandfather    Hypertension Sister    Prostate cancer Brother 7   AAA (abdominal aortic aneurysm) Maternal Uncle    Breast cancer Other     SOCIAL HISTORY:   Social History   Tobacco Use   Smoking  status: Never   Smokeless tobacco: Never  Vaping Use   Vaping status: Never Used  Substance Use Topics   Alcohol use: Yes    Comment: occasionally   Drug use: No    ALLERGIES:  is allergic to cayenne pepper [cayenne], shellfish allergy, and tape.  MEDICATIONS:  Current Outpatient Medications  Medication Sig Dispense Refill   albuterol (VENTOLIN HFA) 108 (90 Base) MCG/ACT inhaler Inhale 2 puffs into the lungs every 6 (six) hours as needed for wheezing or shortness of breath. 26.8 g 4   aspirin 81 MG tablet Take 81 mg by mouth daily.     atorvastatin (LIPITOR) 10 MG tablet Take 1 tablet (10 mg total) by mouth daily. 90 tablet 4   bisacodyl (DULCOLAX) 5 MG EC tablet Take 5 mg by mouth daily as needed for moderate constipation.     Cholecalciferol (VITAMIN D3) 1000 UNITS CAPS Take 1 capsule by mouth daily.      clobetasol (TEMOVATE) 0.05 % external solution Apply 1 application topically daily. Mix bottle in 1 tub of cerave cream and use qd to trunk, arms and legs, avoid face, groin, axilla 50 mL 1    DUPIXENT 300 MG/2ML SOPN INJECT 1 PEN SUBCUTANEOUSLY  EVERY OTHER WEEK 4 mL 6   esomeprazole (NEXIUM) 40 MG capsule Take 40 mg by mouth 2 (two) times daily as needed (acid reflux).      lisinopril (ZESTRIL) 10 MG tablet Take 1 tablet (10 mg total) by mouth daily. 90 tablet 4   loratadine (CLARITIN) 10 MG tablet Take 10 mg by mouth daily as needed. Reported on 04/13/2015     polyethylene glycol (MIRALAX / GLYCOLAX) packet Take 17 g by mouth every other day.     potassium chloride SA (KLOR-CON M) 20 MEQ tablet TAKE 1 TABLET BY MOUTH EVERY DAY 30 tablet 5   tacrolimus (PROTOPIC) 0.1 % ointment Spot treat affected areas on skin once or twice a day 100 g 1   No current facility-administered medications for this visit.   Facility-Administered Medications Ordered in Other Visits  Medication Dose Route Frequency Provider Last Rate Last Admin   sodium chloride 0.9 % injection 10 mL  10 mL Intravenous PRN Janese Banks, MD   10 mL at 11/10/14 1138    PHYSICAL EXAMINATION: ECOG PERFORMANCE STATUS: 1 - Symptomatic but completely ambulatory  BP 114/69 (BP Location: Left Arm, Patient Position: Sitting)   Pulse 88   Temp (!) 97 F (36.1 C) (Tympanic)   Wt 204 lb (92.5 kg)   LMP  (LMP Unknown)   SpO2 100%   BMI 33.78 kg/m   Filed Weights   01/30/23 1003  Weight: 204 lb (92.5 kg)    Physical Exam HENT:     Head: Normocephalic and atraumatic.     Mouth/Throat:     Pharynx: No oropharyngeal exudate.  Eyes:     Pupils: Pupils are equal, round, and reactive to light.  Cardiovascular:     Rate and Rhythm: Normal rate and regular rhythm.  Pulmonary:     Effort: No respiratory distress.     Breath sounds: No wheezing.  Abdominal:     General: Bowel sounds are normal. There is no distension.     Palpations: Abdomen is soft. There is no mass.     Tenderness: There is no abdominal tenderness. There is no guarding or rebound.  Musculoskeletal:        General: No tenderness. Normal range of  motion.  Cervical back: Normal range of motion and neck supple.  Skin:    General: Skin is warm.  Neurological:     Mental Status: She is alert and oriented to person, place, and time.  Psychiatric:        Mood and Affect: Affect normal.      LABORATORY DATA:  I have reviewed the data as listed    Component Value Date/Time   NA 136 01/16/2023 0954   NA 139 11/04/2022 1014   NA 140 07/16/2011 2130   K 3.7 01/16/2023 0954   K 3.7 07/16/2011 2130   CL 106 01/16/2023 0954   CL 106 07/16/2011 2130   CO2 25 01/16/2023 0954   CO2 27 07/16/2011 2130   GLUCOSE 112 (H) 01/16/2023 0954   GLUCOSE 93 07/16/2011 2130   BUN 22 01/16/2023 0954   BUN 18 11/04/2022 1014   BUN 16 07/16/2011 2130   CREATININE 1.00 01/16/2023 0954   CREATININE 0.95 07/16/2011 2130   CALCIUM 9.2 01/16/2023 0954   CALCIUM 9.0 07/16/2011 2130   PROT 7.6 01/16/2023 0954   PROT 7.3 11/04/2022 1014   ALBUMIN 3.7 01/16/2023 0954   ALBUMIN 4.0 11/04/2022 1014   AST 22 01/16/2023 0954   ALT 22 01/16/2023 0954   ALKPHOS 79 01/16/2023 0954   BILITOT 0.6 01/16/2023 0954   GFRNONAA >60 01/16/2023 0954   GFRNONAA >60 07/16/2011 2130   GFRAA 80 04/16/2020 1159   GFRAA >60 07/16/2011 2130    No results found for: "SPEP", "UPEP"  Lab Results  Component Value Date   WBC 7.0 01/16/2023   NEUTROABS 4.3 01/16/2023   HGB 10.1 (L) 01/16/2023   HCT 30.4 (L) 01/16/2023   MCV 96.5 01/16/2023   PLT 233 01/16/2023      Chemistry      Component Value Date/Time   NA 136 01/16/2023 0954   NA 139 11/04/2022 1014   NA 140 07/16/2011 2130   K 3.7 01/16/2023 0954   K 3.7 07/16/2011 2130   CL 106 01/16/2023 0954   CL 106 07/16/2011 2130   CO2 25 01/16/2023 0954   CO2 27 07/16/2011 2130   BUN 22 01/16/2023 0954   BUN 18 11/04/2022 1014   BUN 16 07/16/2011 2130   CREATININE 1.00 01/16/2023 0954   CREATININE 0.95 07/16/2011 2130      Component Value Date/Time   CALCIUM 9.2 01/16/2023 0954   CALCIUM 9.0  07/16/2011 2130   ALKPHOS 79 01/16/2023 0954   AST 22 01/16/2023 0954   ALT 22 01/16/2023 0954   BILITOT 0.6 01/16/2023 0954         ASSESSMENT & PLAN:   MGUS (monoclonal gammopathy of unknown significance) # [2130- 1gm/dl]MGUS- NOV 8657--Q protein- 0.8; Kappa/lamda light chain= 2.8 [K=34].  Overall stable except for mild anemia see below.  No evidence of renal function or hypercalcemia.   # chronic mild anemia-unclear etiology [BL  9-10]; However today Hb 10.2  overaall stable; JAN 2024- ironstudies-13 /ferritin-103.   It is clinically unlikely the patient anemia is related to her underlying monoclonal gammopathy.#Recommend gentle iron [iron biglycinate; 28 mg ] 1 pill a day.  This pill is unlikely to cause stomach upset or cause constipation.   # Chronic mild hypokalemia  [left leg cramps]-today potassium is 3.8.  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  Clinically no evidence of recurrence; see below re: mammogram  # OCT 2024- bil Mammo- [CC] Multiple probably  benign masses within the left breast, favored to represent complicated cysts. Given the imaging appearance on mammography as well, a follow-up left breast diagnostic mammogram and ultrasound is recommended in 6 months/ OCT 2024- s/p  Dr.Cintron:  # Peripheral neuropathy- sec to chemo. grade 1-2- stable  #Osteopenia : Reviewed the bone density  -OCT 2024- The BMD -  T-score of -1.7. continue VitD. [hx of falls]; recommend resistance training [pt currently on @ home/videos] - check BMD in oct 2026  stable  # Obesity- recommend continued weight loss efforts  # DISPOSITION:  # BIL Diagnostic mammogram in April 2025.  # follow up in 6 months-2 weeks prior- MD Vickie Epley- cbc/cmp; b12; folic acid; LDH-/ca-27-29/MM panel;  K-L panel; vit D -OH levels- Dr.B       Earna Coder, MD 01/30/2023 11:02 AM

## 2023-01-30 NOTE — Progress Notes (Signed)
Pt has noticed some nausea after eating. She takes mylanta with some relief. New problem.

## 2023-03-26 ENCOUNTER — Other Ambulatory Visit: Payer: Self-pay | Admitting: Internal Medicine

## 2023-03-31 ENCOUNTER — Other Ambulatory Visit: Payer: Self-pay

## 2023-03-31 DIAGNOSIS — E876 Hypokalemia: Secondary | ICD-10-CM

## 2023-03-31 MED ORDER — POTASSIUM CHLORIDE CRYS ER 20 MEQ PO TBCR: EXTENDED_RELEASE_TABLET | ORAL | 4 refills | Status: AC

## 2023-04-27 ENCOUNTER — Ambulatory Visit: Payer: Managed Care, Other (non HMO) | Admitting: Dermatology

## 2023-04-27 DIAGNOSIS — L209 Atopic dermatitis, unspecified: Secondary | ICD-10-CM | POA: Diagnosis not present

## 2023-04-27 DIAGNOSIS — L2081 Atopic neurodermatitis: Secondary | ICD-10-CM

## 2023-04-27 DIAGNOSIS — Z79899 Other long term (current) drug therapy: Secondary | ICD-10-CM

## 2023-04-27 MED ORDER — TACROLIMUS 0.1 % EX OINT: TOPICAL_OINTMENT | CUTANEOUS | 1 refills | Status: AC

## 2023-04-27 MED ORDER — DUPIXENT 300 MG/2ML ~~LOC~~ SOAJ: SUBCUTANEOUS | 6 refills | Status: DC

## 2023-04-27 NOTE — Patient Instructions (Addendum)
Dupilumab (Dupixent) is a treatment given by injection for adults and children with moderate-to-severe atopic dermatitis. Goal is control of skin condition, not cure. It is given as 2 injections at the first dose followed by 1 injection ever 2 weeks thereafter.  Young children are dosed monthly.  Potential side effects include allergic reaction, herpes infections, injection site reactions and conjunctivitis (inflammation of the eyes).  The use of Dupixent requires long term medication management, including periodic office visits.  Due to recent changes in healthcare laws, you may see results of your pathology and/or laboratory studies on MyChart before the doctors have had a chance to review them. We understand that in some cases there may be results that are confusing or concerning to you. Please understand that not all results are received at the same time and often the doctors may need to interpret multiple results in order to provide you with the best plan of care or course of treatment. Therefore, we ask that you please give [place] 2 business days to thoroughly review all your results before contacting the office for clarification. Should we see a critical lab result, you will be contacted sooner.   If You Need Anything After Your Visit  If you have any questions or concerns for your doctor, please call our main line at [phone] and press option 4 to reach your doctor's medical assistant. If no one answers, please leave a voicemail as directed and we will return your call as soon as possible. Messages left after 4 pm will be answered the following business day.   You may also send [place] a message via MyChart. We typically respond to MyChart messages within 1-2 business days.  For prescription refills, please ask your pharmacy to contact our office. Our fax number is [phone].  If you have an urgent issue when the clinic is closed that cannot wait until the next business day, you can page your  doctor at the number below.    Please note that while we do our best to be available for urgent issues outside of office hours, we are not available 24/7.   If you have an urgent issue and are unable to reach [place], you may choose to seek medical care at your doctor's office, retail clinic, urgent care center, or emergency room.  If you have a medical emergency, please immediately call 911 or go to the emergency department.  Pager Numbers  - Dr. Gwen Pounds: [phone]  - Dr. Roseanne Reno: [phone]  - Dr. Katrinka Blazing: [phone]   In the event of inclement weather, please call our main line at [phone] for an update on the status of any delays or closures.  Dermatology Medication Tips: Please keep the boxes that topical medications come in in order to help keep track of the instructions about where and how to use these. Pharmacies typically print the medication instructions only on the boxes and not directly on the medication tubes.   If your medication is too expensive, please contact our office at [phone] option 4 or send [place] a message through MyChart.   We are unable to tell what your co-pay for medications will be in advance as this is different depending on your insurance coverage. However, we may be able to find a substitute medication at lower cost or fill out paperwork to get insurance to cover a needed medication.   If a prior authorization is required to get your medication covered by your insurance company, please allow [place] 1-2 business days to complete  this process.  Drug prices often vary depending on where the prescription is filled and some pharmacies may offer cheaper prices.  The website www.goodrx.com contains coupons for medications through different pharmacies. The prices here do not account for what the cost may be with help from insurance (it may be cheaper with your insurance), but the website can give you the price if you did not use any insurance.  - You can print  the associated coupon and take it with your prescription to the pharmacy.  - You may also stop by our office during regular business hours and pick up a GoodRx coupon card.  - If you need your prescription sent electronically to a different pharmacy, notify our office through [facility] or by phone at [phone] option 4.     Si Usted Necesita Algo Despus de Su Visita  Tambin puede enviarnos un mensaje a travs de Clinical cytogeneticist. Por lo general respondemos a los mensajes de MyChart en el transcurso de 1 a 2 das hbiles.  Para renovar recetas, por favor pida a su farmacia que se ponga en contacto con nuestra oficina. Annie Sable de fax es [place] [phone].  Si tiene un asunto urgente cuando la clnica est cerrada y que no puede esperar hasta el siguiente da hbil, puede llamar/localizar a su doctor(a) al nmero que aparece a continuacin.   Por favor, tenga en cuenta que aunque hacemos todo lo posible para estar disponibles para asuntos urgentes fuera del horario de [place], no estamos disponibles las 24 horas del da, los 7 [address]  [address] de la [place].   Si tiene un problema urgente y no puede comunicarse con nosotros, puede optar por buscar atencin mdica  en el consultorio de su doctor(a), en una clnica privada, en un centro de atencin urgente o en una sala de emergencias.  Si tiene Engineer, drilling, por favor llame inmediatamente al 911 o vaya a la sala de emergencias.  Nmeros de bper  - Dr. Gwen Pounds: [phone]  - Dra. Roseanne Reno: [phone]  - Dr. Katrinka Blazing: [phone]   En caso de inclemencias del tiempo, por favor llame a Lacy Duverney principal al [phone] para una actualizacin sobre el [place] de cualquier retraso o cierre.  Consejos para la medicacin en dermatologa: Por favor, guarde las cajas en las que vienen los medicamentos de uso tpico para ayudarle a seguir las instrucciones sobre dnde y cmo usarlos. Las farmacias generalmente imprimen las  instrucciones del medicamento slo en las cajas y no directamente en los tubos del [place].   Si su medicamento es muy caro, por favor, pngase en contacto con Rolm Gala llamando al [phone] y presione la opcin 4 o envenos un mensaje a travs de Clinical cytogeneticist.   No podemos decirle cul ser su copago por los medicamentos por adelantado ya que esto es diferente dependiendo de la cobertura de su seguro. Sin embargo, es posible que podamos encontrar un medicamento sustituto a Audiological scientist un formulario para que el seguro cubra el medicamento que se considera necesario.   Si se requiere una autorizacin previa para que su compaa de seguros [place] su medicamento, por favor permtanos de 1 a 2 das hbiles para completar [address]  Los precios de los medicamentos varan con frecuencia dependiendo del Environmental consultant de dnde se surte la receta y alguna farmacias pueden ofrecer precios ms baratos.  El sitio web www.goodrx.com tiene cupones para medicamentos de Health and safety inspector. Los precios aqu no tienen en cuenta lo que podra costar con la ayuda del  seguro (puede ser ms barato con su seguro), pero el sitio web puede darle el precio si no Visual merchandiser.  - Puede imprimir el cupn correspondiente y llevarlo con su receta a la farmacia.  - Tambin puede pasar por nuestra oficina durante el horario de atencin regular y Education officer, museum una tarjeta de cupones de GoodRx.  - Si necesita que su receta se enve electrnicamente a una farmacia diferente, informe a nuestra oficina a travs de MyChart de [place] o por telfono llamando al [phone] y presione la opcin 4.

## 2023-04-27 NOTE — Progress Notes (Signed)
   Follow-Up Visit   Subjective  Adrienne [name] is a 63 y.o. female who presents for the following: Atopic Dermatitis Patient states she has been staying clear with Dupixent injections every 2 weeks, she uses tacrolimus ointment when needed topically. She denies any eye irritation or other side effects   The following portions of the chart were reviewed this encounter and updated as appropriate: medications, allergies, medical history  Review of Systems:  No other skin or systemic complaints except as noted in HPI or Assessment and Plan.  Objective  Well appearing patient in no apparent distress; mood and affect are within normal limits.  Areas Examined: Arms, legs, sacral area  Relevant physical exam findings are noted in the Assessment and Plan.    Assessment & Plan   ATOPIC NEURODERMATITIS   Related Medications tacrolimus (PROTOPIC) 0.1 % ointment Spot treat affected areas on skin once or twice a day Dupilumab (DUPIXENT) 300 MG/2ML SOAJ INJECT 1 PEN SUBCUTANEOUSLY  EVERY OTHER WEEK   ATOPIC DERMATITIS  Exam: hyperpigmented patches on buttocks, skin o/w clear, 1% BSA   Patient denies any side effects or irritation with eyes  Chronic condition with duration or expected duration over one year. Currently well-controlled.  Atopic dermatitis - Severe, on Dupixent (biologic medication).  Atopic dermatitis (eczema) is a chronic, relapsing, pruritic condition that can significantly affect quality of life. It is often associated with allergic rhinitis and/or asthma and can require treatment with topical medications, phototherapy, or in severe cases biologic medications, which require long term medication management.    Treatment Plan:  Continue Dupixent 300 mg/33mL SQ QOW. Patient denies side effects. Continue Tacrolimus ointment spot treat affected areas on skin once or twice a day Cont Dupixent 300mg /48ml sq injections q 2 wks Cont Clobetasol/cerave mix qd/bid to areas of  eczema/itching prn flares (spot treat), avoid f/g/a Cont Cerave cream daily to body after shower Or Cont  La Roche-Posay moisturizer daily   Dupilumab (Dupixent) is a treatment given by injection for adults and children with moderate-to-severe atopic dermatitis. Goal is control of skin condition, not cure. It is given as 2 injections at the first dose followed by 1 injection ever 2 weeks thereafter.  Young children are dosed monthly.   Potential side effects include allergic reaction, herpes infections, injection site reactions and conjunctivitis (inflammation of the eyes).  The use of Dupixent requires long term medication management, including periodic office visits.    Long term medication management.  Patient is using long term (months to years) prescription medication  to control their dermatologic condition.  These medications require periodic monitoring to evaluate for efficacy and side effects and may require periodic laboratory monitoring.   Recommend gentle skin care.   Return in about 6 months (around 10/25/2023) for atopic dermatitis .  I, Asher Muir, CMA, am acting as scribe for Willeen Niece, MD.   Documentation: I have reviewed the above documentation for accuracy and completeness, and I agree with the above.  Willeen Niece, MD

## 2023-05-03 NOTE — Patient Instructions (Signed)

## 2023-05-08 ENCOUNTER — Encounter: Payer: Self-pay | Admitting: Nurse Practitioner

## 2023-05-08 ENCOUNTER — Ambulatory Visit: Payer: Self-pay | Admitting: Nurse Practitioner

## 2023-05-08 ENCOUNTER — Other Ambulatory Visit: Payer: Self-pay | Admitting: Nurse Practitioner

## 2023-05-08 VITALS — BP 113/69 | HR 79 | Temp 97.7°F | Ht 65.1 in | Wt 207.6 lb

## 2023-05-08 DIAGNOSIS — E559 Vitamin D deficiency, unspecified: Secondary | ICD-10-CM

## 2023-05-08 DIAGNOSIS — D649 Anemia, unspecified: Secondary | ICD-10-CM

## 2023-05-08 DIAGNOSIS — E782 Mixed hyperlipidemia: Secondary | ICD-10-CM | POA: Diagnosis not present

## 2023-05-08 DIAGNOSIS — Z23 Encounter for immunization: Secondary | ICD-10-CM | POA: Diagnosis not present

## 2023-05-08 DIAGNOSIS — R7303 Prediabetes: Secondary | ICD-10-CM | POA: Diagnosis not present

## 2023-05-08 DIAGNOSIS — G620 Drug-induced polyneuropathy: Secondary | ICD-10-CM | POA: Diagnosis not present

## 2023-05-08 DIAGNOSIS — I10 Essential (primary) hypertension: Secondary | ICD-10-CM

## 2023-05-08 DIAGNOSIS — R42 Dizziness and giddiness: Secondary | ICD-10-CM

## 2023-05-08 DIAGNOSIS — J4520 Mild intermittent asthma, uncomplicated: Secondary | ICD-10-CM

## 2023-05-08 DIAGNOSIS — E66811 Obesity, class 1: Secondary | ICD-10-CM

## 2023-05-08 DIAGNOSIS — D472 Monoclonal gammopathy: Secondary | ICD-10-CM

## 2023-05-08 DIAGNOSIS — M85851 Other specified disorders of bone density and structure, right thigh: Secondary | ICD-10-CM

## 2023-05-08 LAB — MICROALBUMIN, URINE WAIVED
Creatinine, Urine Waived: 200 mg/dL (ref 10–300)
Microalb, Ur Waived: 30 mg/L — ABNORMAL HIGH (ref 0–19)
Microalb/Creat Ratio: <30 mg/g (ref <30)

## 2023-05-08 LAB — BAYER DCA HB A1C WAIVED: HB A1C (BAYER DCA - WAIVED): 6.1 % — ABNORMAL HIGH (ref 4.8–5.6)

## 2023-05-08 NOTE — Assessment & Plan Note (Signed)
 Chronic, stable.  BP at goal for age at home and in office.  Continue current medication regimen and adjust as needed.  Recommend she continue to monitor BP regularly at home and document + focus on DASH diet.  LABS: CMP and urine ALB.  Urine ALB 30 February 2025.  Return to office in 6 months.

## 2023-05-08 NOTE — Assessment & Plan Note (Signed)
 Chronic, stable with minimal use of inhaler.  Continue current medication regimen and adjust as needed.  Spirometry at physical.

## 2023-05-08 NOTE — Assessment & Plan Note (Signed)
 Chronic, stable without medication at this time.  Has had side effects with Lyrica and Gabapentin, extreme fatigue.  Check CBC.

## 2023-05-08 NOTE — Assessment & Plan Note (Signed)
 Chronic, stable.  Continue supplements daily and educated patient at length on finding.  Check Vitamin D at physical.

## 2023-05-08 NOTE — Assessment & Plan Note (Signed)
 Ongoing.  A1c 6.1% today, slight trend down.  Initiate medications as needed. Urine ALB 30 February 2025. Continue Lisinopril for kidney protection.

## 2023-05-08 NOTE — Assessment & Plan Note (Signed)
 Chronic, ongoing.  Continue current medication regimen and adjust as needed. Lipid panel today.

## 2023-05-08 NOTE — Assessment & Plan Note (Signed)
 Chronic for many years, a little worse over past month.  Overall neuro exam reassuring.  Continue Meclizine as needed at home.  Provided her with Home Epley exercises to perform and instructed on this + educated on BPPV.  Suspect this is BPPV related.  All questions answered.  To return to office if worsening or ongoing.

## 2023-05-08 NOTE — Assessment & Plan Note (Signed)
BMI 33.44.  Recommended eating smaller high protein, low fat meals more frequently and exercising 30 mins a day 5 times a week with a goal of 10-15lb weight loss in the next 3 months. Patient voiced their understanding and motivation to adhere to these recommendations. °

## 2023-05-08 NOTE — Assessment & Plan Note (Signed)
Chronic, ongoing.  Continue collaboration with oncology, recent notes and labs reviewed.  Patient denies any pain today.

## 2023-05-08 NOTE — Progress Notes (Signed)
 BP 113/69   Pulse 79   Temp 97.7 F (36.5 C) (Oral)   Ht 5' 5.1" (1.654 m)   Wt 207 lb 9.6 oz (94.2 kg)   LMP  (LMP Unknown)   SpO2 98%   BMI 34.44 kg/m    Subjective:    Patient ID: Adrienne [name], female    DOB: 08-12-1960, 64 y.o.   MRN: [id number]  HPI: Adrienne [name] is a 63 y.o. female  Chief Complaint  Patient presents with   Hyperlipidemia   Hypertension   Breast Cancer   Osteopenia   HYPERTENSION/HLD Taking Lisinopril, Atorvastatin, and ASA.   Hypertension status: controlled  Satisfied with current treatment? yes Duration of hypertension: chronic BP monitoring frequency: every other day BP range: 110/60-70 BP medication side effects:  no Medication compliance: good compliance Aspirin: yes Recurrent headaches: no Visual changes: no Palpitations: no Dyspnea: no Chest pain: no Lower extremity edema: baseline, wears compression Dizzy/lightheaded: no  The 10-year ASCVD risk score (Arnett DK, et al., 2019) is: 5%   Values used to calculate the score:     Age: 71 years     Sex: Female     Is Non-Hispanic African American: Yes     Diabetic: No     Tobacco smoker: No     Systolic Blood Pressure: 113 mmHg     Is BP treated: Yes     HDL Cholesterol: 66 mg/dL     Total Cholesterol: 150 mg/dL  Impaired Fasting Glucose HbA1C:  Lab Results  Component Value Date   HGBA1C 6.2 (H) 11/04/2022  Duration of elevated blood sugar: years Polydipsia: no Polyuria: no Weight change: no Visual disturbance: no Glucose Monitoring: no    Accucheck frequency: Not Checking    Fasting glucose:     Post prandial:  Diabetic Education: Not Completed Family history of diabetes: yes - mother, father, aunt  MGUS + HISTORY OF BREAST CANCER: Last saw oncology on 01/30/23.  They monitor her anemia.  Denies any symptoms or pain today.  Taking iron supplement. Fatigue: no Decreased exercise tolerance: no  Dyspnea on exertion: no Palpitations: no Bleeding: no Pica: no    DIZZINESS Has had on and off for a very long time, but over past month notices it more.  Mother has vertigo.  Comes on at various times. Duration: months Description of symptoms: off kilter Duration of episode: seconds Dizziness frequency: recurrent Provoking factors: various things Aggravating factors: as above Triggered by rolling over in bed:  a couple times, right side for long makes it worse Triggered by bending over:  sometimes Aggravated by head movement: no Aggravated by exertion, coughing, loud noises: no Recent head injury: no Recent or current viral symptoms: no History of vasovagal episodes: no Nausea: a little bit Vomiting: no Tinnitus: no Hearing loss: no Aural fullness: no Headache: no Photophobia/phonophobia: no Unsteady gait: yes Postural instability: no Diplopia, dysarthria, dysphagia or weakness: no Related to exertion: no Pallor: no Diaphoresis: no Dyspnea: no Chest pain: no  OSTEOPENIA DEXA 06/18/20 = T-score of -1.3 with repeat October 2024 -1.7. Taking Vitamin D and multivitamin daily.  Continues to have neuropathy in feet post chemo. Adequate calcium & vitamin D: yes Weight bearing exercises: yes  Adequate calcium & vitamin D: yes Weight bearing exercises: yes   ASTHMA Asthma status: stable Satisfied with current treatment?: yes Albuterol/rescue inhaler frequency: occasional -- 3 days ago used Dyspnea frequency: no Wheezing frequency: no Cough frequency: no Nocturnal symptom frequency: none Limitation of activity:  no Current upper respiratory symptoms: no Triggers: seasonal, birch trees Home peak flows: none Last Spirometry: unknown Failed/intolerant to following asthma meds: none Asthma meds in past: none Aerochamber/spacer use: no Visits to ER or Urgent Care in past year: no Pneumovax: Up to Date Influenza: Up to Date  Relevant past medical, surgical, family and social history reviewed and updated as indicated. Interim medical  history since our last visit reviewed. Allergies and medications reviewed and updated.  Review of Systems  Constitutional:  Negative for activity change, appetite change, diaphoresis, fatigue and fever.  Respiratory:  Negative for cough, chest tightness and shortness of breath.   Cardiovascular:  Negative for chest pain, palpitations and leg swelling.  Gastrointestinal: Negative.   Endocrine: Negative.   Neurological: Negative.   Psychiatric/Behavioral: Negative.      Per HPI unless specifically indicated above     Objective:    BP 113/69   Pulse 79   Temp 97.7 F (36.5 C) (Oral)   Ht 5' 5.1" (1.654 m)   Wt 207 lb 9.6 oz (94.2 kg)   LMP  (LMP Unknown)   SpO2 98%   BMI 34.44 kg/m   Wt Readings from Last 3 Encounters:  05/08/23 207 lb 9.6 oz (94.2 kg)  01/30/23 204 lb (92.5 kg)  11/04/22 201 lb 12.8 oz (91.5 kg)    Physical Exam Vitals and nursing note reviewed.  Constitutional:      General: She is awake. She is not in acute distress.    Appearance: She is well-developed and well-groomed. She is obese. She is not ill-appearing.  HENT:     Head: Normocephalic.     Right Ear: Hearing, ear canal and external ear normal. A middle ear effusion is present. Tympanic membrane is not injected.     Left Ear: Hearing, ear canal and external ear normal. A middle ear effusion is present. Tympanic membrane is not injected.     Nose: Nose normal.     Right Sinus: No maxillary sinus tenderness or frontal sinus tenderness.     Left Sinus: No maxillary sinus tenderness or frontal sinus tenderness.     Mouth/Throat:     Mouth: Mucous membranes are moist.     Pharynx: No pharyngeal swelling, oropharyngeal exudate or posterior oropharyngeal erythema.  Eyes:     General: Lids are normal.        Right eye: No discharge.        Left eye: No discharge.     Extraocular Movements: Extraocular movements intact.     Right eye: No nystagmus.     Left eye: No nystagmus.     Conjunctiva/sclera:  Conjunctivae normal.     Pupils: Pupils are equal, round, and reactive to light.     Visual Fields: Right eye visual fields normal and left eye visual fields normal.  Neck:     Thyroid: No thyromegaly.     Vascular: No carotid bruit.  Cardiovascular:     Rate and Rhythm: Normal rate and regular rhythm.     Heart sounds: Normal heart sounds. No murmur heard.    No gallop.     Comments: Compression socks in place. Pulmonary:     Effort: Pulmonary effort is normal. No accessory muscle usage or respiratory distress.     Breath sounds: Normal breath sounds.  Abdominal:     General: Bowel sounds are normal.     Palpations: Abdomen is soft. There is no hepatomegaly or splenomegaly.  Musculoskeletal:     Cervical  back: Normal range of motion and neck supple.     Right lower leg: 1+ Edema present.     Left lower leg: 1+ Edema present.  Lymphadenopathy:     Cervical: No cervical adenopathy.  Skin:    General: Skin is warm and dry.  Neurological:     Mental Status: She is alert and oriented to person, place, and time.     Cranial Nerves: Cranial nerves 2-12 are intact.     Motor: Motor function is intact.     Coordination: Coordination is intact.     Gait: Gait is intact.     Deep Tendon Reflexes: Reflexes are normal and symmetric.     Reflex Scores:      Brachioradialis reflexes are 2+ on the right side and 2+ on the left side.      Patellar reflexes are 2+ on the right side and 2+ on the left side. Psychiatric:        Attention and Perception: Attention normal.        Mood and Affect: Mood normal.        Speech: Speech normal.        Behavior: Behavior normal. Behavior is cooperative.        Thought Content: Thought content normal.    Results for orders placed or performed in visit on 01/16/23  VITAMIN D 25 Hydroxy (Vit-D Deficiency, Fractures)   Collection Time: 01/16/23  9:53 AM  Result Value Ref Range   Vit D, 25-Hydroxy 51.91 30 - 100 ng/mL  Kappa/lambda light chains    Collection Time: 01/16/23  9:53 AM  Result Value Ref Range   Kappa free light chain 35.8 (H) 3.3 - 19.4 mg/L   Lambda free light chains 12.9 5.7 - 26.3 mg/L   Kappa, lambda light chain ratio 2.78 (H) 0.26 - 1.65  Cancer antigen 27.29   Collection Time: 01/16/23  9:53 AM  Result Value Ref Range   CA 27.29 13.9 0.0 - 38.6 U/mL  Lactate dehydrogenase   Collection Time: 01/16/23  9:53 AM  Result Value Ref Range   LDH 137 98 - 192 U/L  Folate   Collection Time: 01/16/23  9:53 AM  Result Value Ref Range   Folate >40.0 >5.9 ng/mL  CBC with Differential (Cancer Center Only)   Collection Time: 01/16/23  9:53 AM  Result Value Ref Range   WBC Count 7.0 4.0 - 10.5 K/uL   RBC 3.15 (L) 3.87 - 5.11 MIL/uL   Hemoglobin 10.1 (L) 12.0 - 15.0 g/dL   HCT 16.1 (L) 09.6 - 04.5 %   MCV 96.5 80.0 - 100.0 fL   MCH 32.1 26.0 - 34.0 pg   MCHC 33.2 30.0 - 36.0 g/dL   RDW 40.9 81.1 - 91.4 %   Platelet Count 233 150 - 400 K/uL   nRBC 0.0 0.0 - 0.2 %   Neutrophils Relative % 62 %   Neutro Abs 4.3 1.7 - 7.7 K/uL   Lymphocytes Relative 29 %   Lymphs Abs 2.0 0.7 - 4.0 K/uL   Monocytes Relative 7 %   Monocytes Absolute 0.5 0.1 - 1.0 K/uL   Eosinophils Relative 2 %   Eosinophils Absolute 0.1 0.0 - 0.5 K/uL   Basophils Relative 0 %   Basophils Absolute 0.0 0.0 - 0.1 K/uL   Immature Granulocytes 0 %   Abs Immature Granulocytes 0.02 0.00 - 0.07 K/uL  Multiple Myeloma Panel (SPEP&IFE w/QIG)   Collection Time: 01/16/23  9:54 AM  Result  Value Ref Range   IgG (Immunoglobin G), Serum 1,882 (H) 586 - 1,602 mg/dL   IgA 161 87 - 096 mg/dL   IgM (Immunoglobulin M), Srm 48 26 - 217 mg/dL   Total Protein ELP 7.1 6.0 - 8.5 g/dL   Albumin SerPl Elph-Mcnc 3.8 2.9 - 4.4 g/dL   Alpha 1 0.2 0.0 - 0.4 g/dL   Alpha2 Glob SerPl Elph-Mcnc 0.8 0.4 - 1.0 g/dL   B-Globulin SerPl Elph-Mcnc 0.8 0.7 - 1.3 g/dL   Gamma Glob SerPl Elph-Mcnc 1.5 0.4 - 1.8 g/dL   M Protein SerPl Elph-Mcnc 0.8 (H) Not Observed g/dL   Globulin,  Total 3.3 2.2 - 3.9 g/dL   Albumin/Glob SerPl 1.2 0.7 - 1.7   IFE 1 Comment (A)    Please Note Comment   Vitamin B12   Collection Time: 01/16/23  9:54 AM  Result Value Ref Range   Vitamin B-12 861 180 - 914 pg/mL  CMP (Cancer Center only)   Collection Time: 01/16/23  9:54 AM  Result Value Ref Range   Sodium 136 135 - 145 mmol/L   Potassium 3.7 3.5 - 5.1 mmol/L   Chloride 106 98 - 111 mmol/L   CO2 25 22 - 32 mmol/L   Glucose, Bld 112 (H) 70 - 99 mg/dL   BUN 22 8 - 23 mg/dL   Creatinine 0.45 4.09 - 1.00 mg/dL   Calcium 9.2 8.9 - 81.1 mg/dL   Total Protein 7.6 6.5 - 8.1 g/dL   Albumin 3.7 3.5 - 5.0 g/dL   AST 22 15 - 41 U/L   ALT 22 0 - 44 U/L   Alkaline Phosphatase 79 38 - 126 U/L   Total Bilirubin 0.6 <1.2 mg/dL   GFR, Estimated >91 >47 mL/min   Anion gap 5 5 - 15      Assessment & Plan:   Problem List Items Addressed This Visit       Cardiovascular and Mediastinum   Hypertension   Chronic, stable.  BP at goal for age at home and in office.  Continue current medication regimen and adjust as needed.  Recommend she continue to monitor BP regularly at home and document + focus on [name] diet.  LABS: CMP and urine ALB.  Urine ALB 30 February 2025.  Return to office in 6 months.        Respiratory   Asthma   Chronic, stable with minimal use of inhaler.  Continue current medication regimen and adjust as needed.  Spirometry at physical.        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  Has had side effects with Lyrica and Gabapentin, extreme fatigue.  Check CBC.        Musculoskeletal and Integument   Osteopenia of neck of right femur   Chronic, stable.  Continue supplements daily and educated patient at length on finding.  Check Vitamin D at physical.        Other   Dizziness   Chronic for many years, a little worse over past month.  Overall neuro exam reassuring.  Continue  Meclizine as needed at home.  Provided her with Home Epley exercises to perform and instructed on this + educated on BPPV.  Suspect this is BPPV related.  All questions answered.  To return to office if worsening or ongoing.      Hyperlipemia, mixed   Chronic, ongoing.  Continue current medication  regimen and adjust as needed.  Lipid panel today.      Relevant Orders   Lipid Panel w/o Chol/HDL Ratio   Low hemoglobin   Monitored by oncology--recent labs reviewed.  Continue gentle iron at home, as ordered by oncology.      MGUS (monoclonal gammopathy of unknown significance)   Chronic, ongoing.  Continue collaboration with oncology, recent notes and labs reviewed.  Patient denies any pain today.      Obesity   BMI 33.44.  Recommended eating smaller high protein, low fat meals more frequently and exercising 30 mins a day 5 times a week with a goal of 10-15lb weight loss in the next 3 months. Patient voiced their understanding and motivation to adhere to these recommendations.       Prediabetes   Ongoing.  A1c 6.1% today, slight trend down.  Initiate medications as needed. Urine ALB 30 February 2025. Continue Lisinopril for kidney protection.      Relevant Orders   Bayer DCA Hb A1c Waived   Microalbumin, Urine Waived   Vitamin D deficiency   Chronic, stable with recent levels at goal.  Continue supplement and check level today.      Other Visit Diagnoses       Pneumococcal vaccination given       PCV20 in office today, educated on this.   Relevant Orders   Pneumococcal conjugate vaccine 20-valent (Completed)        Follow up plan: Return in about 6 months (around 11/05/2023) for Annual Physical --  after 11/04/23 -- will need spirometry.

## 2023-05-08 NOTE — Assessment & Plan Note (Signed)
 Monitored by oncology--recent labs reviewed.  Continue gentle iron at home, as ordered by oncology.

## 2023-05-08 NOTE — Assessment & Plan Note (Signed)
Chronic, stable with recent levels at goal.  Continue supplement and check level today.

## 2023-05-09 ENCOUNTER — Encounter: Payer: Self-pay | Admitting: Nurse Practitioner

## 2023-05-09 LAB — LIPID PANEL W/O CHOL/HDL RATIO
Cholesterol, Total: 164 mg/dL (ref 100–199)
HDL: 66 mg/dL (ref >39)
LDL Chol Calc (NIH): 83 mg/dL (ref 0–99)
Triglycerides: 79 mg/dL (ref 0–149)
VLDL Cholesterol Cal: 15 mg/dL (ref 5–40)

## 2023-05-09 NOTE — Progress Notes (Signed)
 Contacted via MyChart   Good morning Adrienne [name].  Lipid panel returned and remains stable.  Continue Atorvastatin daily.  Any questions? Keep being amazing!!  Thank you for allowing me to participate in your care.  I appreciate you. Kindest regards, Stefania Goulart

## 2023-06-24 ENCOUNTER — Other Ambulatory Visit: Payer: Self-pay | Admitting: Internal Medicine

## 2023-06-24 ENCOUNTER — Ambulatory Visit
Admission: RE | Admit: 2023-06-24 | Discharge: 2023-06-24 | Disposition: A | Discharge status: Home / Self Care | Source: Ambulatory Visit | Attending: Internal Medicine | Admitting: Internal Medicine

## 2023-06-24 ENCOUNTER — Ambulatory Visit
Admission: RE | Admit: 2023-06-24 | Discharge: 2023-06-24 | Disposition: A | Discharge status: Home / Self Care | Payer: Managed Care, Other (non HMO) | Source: Ambulatory Visit | Attending: Internal Medicine | Admitting: Internal Medicine

## 2023-06-24 DIAGNOSIS — D472 Monoclonal gammopathy: Secondary | ICD-10-CM | POA: Diagnosis present

## 2023-06-24 DIAGNOSIS — N6313 Unspecified lump in the right breast, lower outer quadrant: Secondary | ICD-10-CM

## 2023-06-24 DIAGNOSIS — Z853 Personal history of malignant neoplasm of breast: Secondary | ICD-10-CM | POA: Diagnosis present

## 2023-07-17 ENCOUNTER — Inpatient Hospital Stay: Payer: Managed Care, Other (non HMO) | Attending: Internal Medicine

## 2023-07-17 ENCOUNTER — Other Ambulatory Visit: Payer: Self-pay

## 2023-07-17 DIAGNOSIS — D649 Anemia, unspecified: Secondary | ICD-10-CM | POA: Insufficient documentation

## 2023-07-17 DIAGNOSIS — G629 Polyneuropathy, unspecified: Secondary | ICD-10-CM | POA: Diagnosis not present

## 2023-07-17 DIAGNOSIS — M8580 Other specified disorders of bone density and structure, unspecified site: Secondary | ICD-10-CM | POA: Insufficient documentation

## 2023-07-17 DIAGNOSIS — Z08 Encounter for follow-up examination after completed treatment for malignant neoplasm: Secondary | ICD-10-CM | POA: Diagnosis present

## 2023-07-17 DIAGNOSIS — Z853 Personal history of malignant neoplasm of breast: Secondary | ICD-10-CM | POA: Diagnosis present

## 2023-07-17 DIAGNOSIS — D472 Monoclonal gammopathy: Secondary | ICD-10-CM | POA: Insufficient documentation

## 2023-07-17 DIAGNOSIS — E876 Hypokalemia: Secondary | ICD-10-CM | POA: Insufficient documentation

## 2023-07-17 LAB — CMP (CANCER CENTER ONLY)
ALT: 22 U/L (ref 0–44)
AST: 22 U/L (ref 15–41)
Albumin: 3.9 g/dL (ref 3.5–5.0)
Alkaline Phosphatase: 88 U/L (ref 38–126)
Anion gap: 8 (ref 5–15)
BUN: 23 mg/dL (ref 8–23)
CO2: 24 mmol/L (ref 22–32)
Calcium: 9 mg/dL (ref 8.9–10.3)
Chloride: 104 mmol/L (ref 98–111)
Creatinine: 0.88 mg/dL (ref 0.44–1.00)
GFR, Estimated: >60 mL/min (ref >60)
Glucose, Bld: 109 mg/dL — ABNORMAL HIGH (ref 70–99)
Potassium: 4 mmol/L (ref 3.5–5.1)
Sodium: 136 mmol/L (ref 135–145)
Total Bilirubin: 0.6 mg/dL (ref 0.0–1.2)
Total Protein: 7.9 g/dL (ref 6.5–8.1)

## 2023-07-17 LAB — CBC WITH DIFFERENTIAL (CANCER CENTER ONLY)
Abs Immature Granulocytes: 0.03 10*3/uL (ref 0.00–0.07)
Basophils Absolute: 0.1 10*3/uL (ref 0.0–0.1)
Basophils Relative: 1 %
Eosinophils Absolute: 0.2 10*3/uL (ref 0.0–0.5)
Eosinophils Relative: 2 %
HCT: 29 % — ABNORMAL LOW (ref 36.0–46.0)
Hemoglobin: 9.6 g/dL — ABNORMAL LOW (ref 12.0–15.0)
Immature Granulocytes: 1 %
Lymphocytes Relative: 35 %
Lymphs Abs: 2.3 10*3/uL (ref 0.7–4.0)
MCH: 32 pg (ref 26.0–34.0)
MCHC: 33.1 g/dL (ref 30.0–36.0)
MCV: 96.7 fL (ref 80.0–100.0)
Monocytes Absolute: 0.6 10*3/uL (ref 0.1–1.0)
Monocytes Relative: 9 %
Neutro Abs: 3.5 10*3/uL (ref 1.7–7.7)
Neutrophils Relative %: 52 %
Platelet Count: 215 10*3/uL (ref 150–400)
RBC: 3 MIL/uL — ABNORMAL LOW (ref 3.87–5.11)
RDW: 13.9 % (ref 11.5–15.5)
WBC Count: 6.5 10*3/uL (ref 4.0–10.5)
nRBC: 0 % (ref 0.0–0.2)

## 2023-07-17 LAB — VITAMIN B12: Vitamin B-12: 912 pg/mL (ref 180–914)

## 2023-07-17 LAB — LACTATE DEHYDROGENASE: LDH: 155 U/L (ref 98–192)

## 2023-07-17 LAB — VITAMIN D 25 HYDROXY (VIT D DEFICIENCY, FRACTURES): Vit D, 25-Hydroxy: 55.05 ng/mL (ref 30–100)

## 2023-07-17 LAB — FOLATE: Folate: >40 ng/mL (ref >5.9)

## 2023-07-18 LAB — KAPPA/LAMBDA LIGHT CHAINS
Kappa free light chain: 31.3 mg/L — ABNORMAL HIGH (ref 3.3–19.4)
Kappa, lambda light chain ratio: 2.65 — ABNORMAL HIGH (ref 0.26–1.65)
Lambda free light chains: 11.8 mg/L (ref 5.7–26.3)

## 2023-07-18 LAB — CANCER ANTIGEN 27.29: CA 27.29: 11.8 U/mL (ref 0.0–38.6)

## 2023-07-24 LAB — MULTIPLE MYELOMA PANEL, SERUM
Albumin SerPl Elph-Mcnc: 3.8 g/dL (ref 2.9–4.4)
Albumin/Glob SerPl: 1.1 (ref 0.7–1.7)
Alpha 1: 0.2 g/dL (ref 0.0–0.4)
Alpha2 Glob SerPl Elph-Mcnc: 0.8 g/dL (ref 0.4–1.0)
B-Globulin SerPl Elph-Mcnc: 0.9 g/dL (ref 0.7–1.3)
Gamma Glob SerPl Elph-Mcnc: 1.7 g/dL (ref 0.4–1.8)
Globulin, Total: 3.7 g/dL (ref 2.2–3.9)
IgA: 148 mg/dL (ref 87–352)
IgG (Immunoglobin G), Serum: 2213 mg/dL — ABNORMAL HIGH (ref 586–1602)
IgM (Immunoglobulin M), Srm: 48 mg/dL (ref 26–217)
M Protein SerPl Elph-Mcnc: 1.2 g/dL — ABNORMAL HIGH
Total Protein ELP: 7.5 g/dL (ref 6.0–8.5)

## 2023-07-30 ENCOUNTER — Inpatient Hospital Stay: Payer: Managed Care, Other (non HMO) | Admitting: Nurse Practitioner

## 2023-07-30 ENCOUNTER — Encounter: Payer: Self-pay | Admitting: Nurse Practitioner

## 2023-07-30 DIAGNOSIS — Z853 Personal history of malignant neoplasm of breast: Secondary | ICD-10-CM

## 2023-07-30 DIAGNOSIS — D472 Monoclonal gammopathy: Secondary | ICD-10-CM | POA: Diagnosis not present

## 2023-07-30 DIAGNOSIS — Z08 Encounter for follow-up examination after completed treatment for malignant neoplasm: Secondary | ICD-10-CM | POA: Diagnosis not present

## 2023-07-30 NOTE — Progress Notes (Signed)
 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   # 2012 LEFT BREAST CA STAGE I s/p Lumpec [T1 (0.7CM);Triple Neg; G-3]; s/p mammosite RT; No adj chemo  #  Mild Anemia/M-protein 0.8gm/dl [sep 4010]  # BRCA- 1&2 NEGATIVE [july 2016]; s/p Genetic counseling [June 2020]  # PN s/p acupuncture  --------------------------------------------------     DIAGNOSIS: breast cancer  STAGE:   I      ;GOALS: cure  CURRENT/MOST RECENT THERAPY: surveillaince     History of breast cancer  08/31/2018 Genetic Testing   Two VUS identified on the CustomNext-Cancer+RNAinsight.  CFTR p.R450I and CTNNA1 p.L402S VUS.  The CustomNext-Expanded gene panel offered by [facility] and includes sequencing and rearrangement analysis for the following 81 genes: AIP, ALK, APC*, ATM*, AXIN2, BAP1, BARD1, BLM, BMPR1A, BRCA1*, BRCA2*, BRIP1*, CDC73, CDH1*, CDK4, CDKN1B, CDKN2A, CHEK2*, CTNNA1, DICER1, FANCC, FH, FLCN, GALNT12, HOXB13, KIT, MAX, MEN1, MET, MLH1*, MRE11A, MSH2*, MSH6*, MUTYH*, NBN, NF1*, NF2, NTHL1, PALB2*, PDGFRA, PHOX2B, PMS2*, POLD1, POLE, POT1, PRKAR1A, PTCH1, PTEN*, RAD50, RAD51C*, RAD51D*, RB1, RET, SDHA, SDHAF2, SDHB, SDHC, SDHD, SMAD4, SMARCA4, SMARCB1, SMARCE1, STK11, SUFU, TMEM127, TP53*, TSC1, TSC2, VHL and XRCC2 (sequencing and deletion/duplication); CASR, CFTR, CPA1, CTRC, EGFR, MITF, PRSS1 and SPINK1 (sequencing only); EPCAM and GREM1 (deletion/duplication only). DNA and RNA analyses performed for * genes. The report date is September 01, 2018.     INTERVAL HISTORY: Alone. Ambulating independently.  A pleasant 63 y.o. 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 She continues to feel at baseline and denies complaints. No new masses or bumps. No unintentional weight loss or bone pain. No skin changes or pain of breast.   Review of Systems  Constitutional:  Negative for chills, diaphoresis, fever, malaise/fatigue and weight loss.  HENT:  Negative for nosebleeds and sore throat.   Eyes:  Negative for double vision.  Respiratory:  Negative for cough, hemoptysis, sputum production, shortness of breath and wheezing.   Cardiovascular:  Negative for chest pain, palpitations, orthopnea and leg swelling.  Gastrointestinal:  Negative for abdominal pain, blood in stool, constipation, diarrhea, heartburn, melena, nausea and vomiting.  Genitourinary:  Negative for dysuria, frequency and urgency.  Musculoskeletal:  Positive for joint pain. Negative for back pain.  Skin: Negative.  Negative for itching and rash.  Neurological:  Positive for tingling. Negative for dizziness, focal weakness, weakness and headaches.  Endo/Heme/Allergies:  Does not bruise/bleed easily.  Psychiatric/Behavioral:  Negative for depression. The patient is not nervous/anxious and does not have insomnia.    PAST MEDICAL HISTORY :  Past Medical History:  Diagnosis Date   Anemia    Asthma    BRCA negative 09/22/2014   Breast cancer (HCC) 2012   LT LUMPECTOMY   Breast cancer (HCC) 2016   RT LUMPECTOMY   Breast screening, unspecified    Constipation    Dry eyes 2012   Eczema    Family history of breast cancer    Family history of ovarian cancer  Family history of prostate cancer    GERD (gastroesophageal reflux disease)    Hematuria    Kidney stone    Lump or mass in breast    Malignant neoplasm of breast (female), unspecified site 07/08/2014    Right breast, 5 mm, T1a,N0, triple negative; high risk for recurrence on Mammoprint.   Malignant neoplasm of upper-outer quadrant of female breast (HCC) 05/08/2010   Left breast: T1b, N0, M); triple negative, DCIS present. No chemotherapy. MammoSite radiation.   Neuropathy    pt states in fingertips and feet   Obesity, unspecified    Pancreatic lesion    Personal history of chemotherapy 2016   BREAST CA- Right   Personal history of malignant neoplasm of breast 2012   has been treated with left breast wide excision and radiation therapy; Patient has DCIS as well as a 7 mm infiltrating mammary carcinoma triple negative removed on 05-08-10   Personal history of radiation therapy 2012   BREAST CA - MAMMOSITE- Left   Personal history of radiation therapy 2016   BREAST CA - MAMMOSITE- Right   Screening for obesity    Seasonal allergies    Special screening for malignant neoplasms, colon    Unspecified essential hypertension    PAST SURGICAL HISTORY :   Past Surgical History:  Procedure Laterality Date   BREAST BIOPSY Right 2016   Invasive   BREAST CYST ASPIRATION Left 2010   BREAST LUMPECTOMY Left 2012   BREAST CA   BREAST LUMPECTOMY Right 2016   INVASIVE MAMMARY CARCINOMA   BREAST LUMPECTOMY WITH AXILLARY LYMPH NODE BIOPSY Right 08/01/2014   Procedure: BREAST LUMPECTOMY WITH AXILLARY LYMPH NODE BIOPSY;  Surgeon: Marshall Skeeter, MD;  Location: [facility] ORS;  Service: General;  Laterality: Right;   BREAST MAMMOSITE  April 2012   mammosite placement and removal    BREAST SURGERY Left 2012   left breast wide excision, sentinel node, MammoSite   BREAST SURGERY Right 08/06/2014   Wide excision/sentinel node biopsy, T1a, N0 triple negative, MammoSite   CESAREAN SECTION  1988   COLONOSCOPY  October 17, 2009   Dr. Evone Hoh, normal study.   KIDNEY STONE SURGERY  2003   stones removed by Dr. Bearl Limes- stent placed/removed   PORTACATH PLACEMENT Left 11/09/2014   Procedure: INSERTION  PORT-A-CATH;  Surgeon: Marshall Skeeter, MD;  Location: [facility] ORS;  Service: General;  Laterality: Left;   SENTINEL NODE BIOPSY Right 08/01/2014   Procedure: SENTINEL NODE BIOPSY;  Surgeon: Marshall Skeeter, MD;  Location: [facility] ORS;  Service: General;  Laterality: Right;   TUBAL LIGATION     FAMILY HISTORY :   Family History  Problem Relation Age of Onset   Ovarian cancer Mother    Diabetes Mother    Diabetes Father    Prostate cancer Father        dx in his 40s   Hypertension Brother    Cancer Maternal Grandmother        bone   Cancer Maternal Grandfather        lung   Cancer Paternal Grandmother        lung   Asthma Paternal Grandfather    Hypertension Sister    Prostate cancer Brother 24   AAA (abdominal aortic aneurysm) Maternal Uncle    Breast cancer Other    SOCIAL HISTORY:   Social History   Tobacco Use   Smoking status: Never   Smokeless tobacco: Never  Vaping Use   Vaping status: Never Used  Substance Use Topics   Alcohol use: Yes    Comment: occasionally   Drug use: No   ALLERGIES:  is allergic to cayenne pepper [cayenne], shellfish allergy, and tape.  MEDICATIONS:  Current Outpatient Medications  Medication Sig Dispense Refill   albuterol  (VENTOLIN  HFA) 108 (90 Base) MCG/ACT inhaler Inhale 2 puffs into the lungs every 6 (six) hours as needed for wheezing or shortness of breath. 26.8 g 4   aspirin 81 MG tablet Take 81 mg by mouth daily.     atorvastatin  (LIPITOR) 10 MG tablet Take 1 tablet (10 mg total) by mouth daily. 90 tablet 4   bisacodyl (DULCOLAX) 5 MG EC tablet Take 5 mg by mouth daily as needed for moderate constipation.     Cholecalciferol (VITAMIN D3) 1000 UNITS CAPS Take 1 capsule by mouth daily.      clobetasol  (TEMOVATE ) 0.05 % external solution Apply 1 application topically daily. Mix bottle in 1 tub of cerave cream and use qd to trunk, arms and legs, avoid face, groin, axilla 50 mL 1   Dupilumab  (DUPIXENT ) 300 MG/2ML SOAJ INJECT 1 PEN  SUBCUTANEOUSLY  EVERY OTHER WEEK 4 mL 6   esomeprazole (NEXIUM) 40 MG capsule Take 40 mg by mouth 2 (two) times daily as needed (acid reflux).      lisinopril  (ZESTRIL ) 10 MG tablet Take 1 tablet (10 mg total) by mouth daily. 90 tablet 4   loratadine (CLARITIN) 10 MG tablet Take 10 mg by mouth daily as needed. Reported on 04/13/2015     polyethylene glycol (MIRALAX / GLYCOLAX) packet Take 17 g by mouth every other day.     potassium chloride  SA (KLOR-CON  M) 20 MEQ tablet TAKE 1 TABLET BY MOUTH EVERY DAY 30 tablet 4   tacrolimus  (PROTOPIC ) 0.1 % ointment Spot treat affected areas on skin once or twice a day 100 g 1   No current facility-administered medications for this visit.   Facility-Administered Medications Ordered in Other Visits  Medication Dose Route Frequency Provider Last Rate Last Admin   sodium chloride  0.9 % injection 10 mL  10 mL Intravenous PRN Rufus Council, MD   10 mL at 11/10/14 1138   PHYSICAL EXAMINATION: ECOG PERFORMANCE STATUS: 1 - Symptomatic but completely ambulatory  BP (!) 126/95 (BP Location: Left Arm, Patient Position: Sitting, Cuff Size: Normal)   Pulse 76   Temp (!) 96.8 F (36 C) (Tympanic)   Resp 18   Ht 5\' 5"  (1.651 m)   Wt 207 lb 12.8 oz (94.3 kg)   LMP  (LMP Unknown)   SpO2 99%   BMI 34.58 kg/m   Filed Weights   07/30/23 1031  Weight: 207 lb 12.8 oz (94.3 kg)   Physical Exam HENT:     Head: Normocephalic and atraumatic.     Mouth/Throat:     Pharynx: No oropharyngeal exudate.  Eyes:     Pupils: Pupils are equal, round, and reactive to light.  Cardiovascular:     Rate and Rhythm: Normal rate and regular rhythm.  Pulmonary:     Effort: No respiratory distress.     Breath sounds: No wheezing.  Abdominal:     General: There is no distension.     Palpations: Abdomen is soft.     Tenderness: There is no abdominal tenderness. There is no guarding.  Musculoskeletal:        General: No tenderness. Normal range of motion.  Skin:    General:  Skin is warm.  Neurological:  Mental Status: She is alert and oriented to person, place, and time.  Psychiatric:        Mood and Affect: Mood and affect normal.        Behavior: Behavior normal.    LABORATORY DATA:  I have reviewed the data as listed    Component Value Date/Time   NA 136 07/17/2023 0859   NA 139 11/04/2022 1014   NA 140 07/16/2011 2130   K 4.0 07/17/2023 0859   K 3.7 07/16/2011 2130   CL 104 07/17/2023 0859   CL 106 07/16/2011 2130   CO2 24 07/17/2023 0859   CO2 27 07/16/2011 2130   GLUCOSE 109 (H) 07/17/2023 0859   GLUCOSE 93 07/16/2011 2130   BUN 23 07/17/2023 0859   BUN 18 11/04/2022 1014   BUN 16 07/16/2011 2130   CREATININE 0.88 07/17/2023 0859   CREATININE 0.95 07/16/2011 2130   CALCIUM  9.0 07/17/2023 0859   CALCIUM  9.0 07/16/2011 2130   PROT 7.9 07/17/2023 0859   PROT 7.3 11/04/2022 1014   ALBUMIN 3.9 07/17/2023 0859   ALBUMIN 4.0 11/04/2022 1014   AST 22 07/17/2023 0859   ALT 22 07/17/2023 0859   ALKPHOS 88 07/17/2023 0859   BILITOT 0.6 07/17/2023 0859   GFRNONAA >60 07/17/2023 0859   GFRNONAA >60 07/16/2011 2130   GFRAA 80 04/16/2020 1159   GFRAA >60 07/16/2011 2130   Lab Results  Component Value Date   WBC 6.5 07/17/2023   NEUTROABS 3.5 07/17/2023   HGB 9.6 (L) 07/17/2023   HCT 29.0 (L) 07/17/2023   MCV 96.7 07/17/2023   PLT 215 07/17/2023      Chemistry      Component Value Date/Time   NA 136 07/17/2023 0859   NA 139 11/04/2022 1014   NA 140 07/16/2011 2130   K 4.0 07/17/2023 0859   K 3.7 07/16/2011 2130   CL 104 07/17/2023 0859   CL 106 07/16/2011 2130   CO2 24 07/17/2023 0859   CO2 27 07/16/2011 2130   BUN 23 07/17/2023 0859   BUN 18 11/04/2022 1014   BUN 16 07/16/2011 2130   CREATININE 0.88 07/17/2023 0859   CREATININE 0.95 07/16/2011 2130      Component Value Date/Time   CALCIUM  9.0 07/17/2023 0859   CALCIUM  9.0 07/16/2011 2130   ALKPHOS 88 07/17/2023 0859   AST 22 07/17/2023 0859   ALT 22 07/17/2023 0859    BILITOT 0.6 07/17/2023 0859     06/24/23- MM 3D Diagnostic mammogram bilateral & ultrasound EXAM: DIGITAL DIAGNOSTIC BILATERAL MAMMOGRAM WITH TOMOSYNTHESIS AND CAD; ULTRASOUND RIGHT BREAST LIMITED; ULTRASOUND LEFT BREAST LIMITED   TECHNIQUE: Bilateral digital diagnostic mammography and breast tomosynthesis was performed. The images were evaluated with computer-aided detection. ; Targeted ultrasound examination of the right breast was performed; Targeted ultrasound examination of the left breast was performed.   COMPARISON:  Previous exam(s).   ACR Breast Density Category b: There are scattered areas of fibroglandular density.   FINDINGS: Diagnostic images of the RIGHT breast demonstrate 2 adjacent oval circumscribed masses in the RIGHT outer breast at posterior depth. There is density and architectural distortion within the RIGHT breast, consistent with postsurgical changes. These are stable in comparison to prior.   Diagnostic images of the LEFT breast demonstrate decreased conspicuity of previously described mass in the lower inner breast at anterior to middle depth compared to prior. Similar appearance of a mass in the LEFT upper outer breast at posterior depth. There is density and architectural distortion  within the LEFT breast, consistent with postsurgical changes. These are stable in comparison to prior. No new suspicious findings are noted.   On physical exam, no suspicious mass is appreciated in the RIGHT outer breast.   Targeted ultrasound was performed of the RIGHT breast. At 10 o'clock 6 cm from the nipple, there is an oval circumscribed anechoic mass with posterior acoustic enhancement and a thin internal septation. It measures 7 x 5 by 5 mm and is consistent with a benign cyst. At 10 o'clock 7 cm from the nipple, there is an oval circumscribed anechoic mass with posterior acoustic enhancement and a thin internal septation. It measures 7 x 4 x 6 mm and is  consistent with a benign cyst.   Targeted ultrasound was performed of the LEFT breast. At 1 o'clock 6 cm from the nipple, there is revisualization of an oval circumscribed near anechoic mass with posterior acoustic enhancement. It measures 3 x 4 x 3 mm, previously 3 x 2 x 5 mm in 2024. This is not significantly changed in comparison to prior given differences in scan plane technique.   Targeted ultrasound was performed LEFT lower inner breast. At 8 o'clock 5 cm from the nipple there is decreased conspicuity of previously described conglomeration of cystic appearing masses. Residual trace cystic appearing changes are noted spanning 3 x 2 x 7 mm, previously 10 by 5 x 4 mm.   IMPRESSION: 1. Stable probably benign LEFT breast mass at 1 o'clock. Recommend follow-up diagnostic mammogram and ultrasound in 1 year. This will establish 2 years of definitive stability. 2. Decrease in size of a LEFT breast mass at 8 o'clock consistent with benign cystic etiology. 3. Benign cysts are noted in the RIGHT breast. No mammographic evidence of malignancy in the RIGHT breast.   ASSESSMENT & PLAN:   MGUS (monoclonal gammopathy of unknown significance) # [6578- 1gm/dl]MGUS- NOV 4696--E protein- 0.8; Kappa/lamda light chain= 2.8 [K=34].  May 2025- M protein- 1.2, Kappa/lambda light chain -2.65 (K-31.3). Stable except for mild anemia. No evidence of renal dysfunction or hypercalcemia.     # Chronic mild anemia-unclear etiology [BL  9-10]. Jan 2024 ferritin 103, iron studies 13%. Folate and b12 are normal. On gentle iron. Today Hb 9.6. Overall stable.   # Chronic mild hypokalemia  [left leg cramps]-today potassium is 4.0. Continue supplementation as per PCP- stable   # 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  Clinically no evidence of recurrence; see below re: mammograms   # OCT 2024- bil Mammo- [CC] Multiple probably benign masses within the left breast, favored to represent complicated cysts.  Given the imaging appearance on mammography as well, a follow-up left breast diagnostic mammogram and ultrasound is recommended in 6 months/ OCT 2024- s/p  Dr.Cintron:  # April 2025- Mammo bil (CC)- stable likely benign left breast mass at 1:00. Decrease in left breast mass at 8:00. Benign cysts in right breast. Repeat bil diagnostic mammo with ultrasounds in April 2026.    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 continue VitD. [hx of falls]; recommend resistance training [pt currently on @ home/videos] - check BMD in oct 2026  stable   # Obesity- recommend continued weight loss efforts  # BIL Diagnostic mammogram in April 2026  # Port-a-cath: placed 11/2014 by Dr Marquita Situ. Removed 2017.    # DISPOSITION:  # 6 months-2 weeks prior- labs (  cbc, cmp, b12, ferritin, iron studies, folic acid , LDH, ca 27-29, MM panel, K-L panel, Vit D), Dr Valentine Gasmen- [place]  No problem-specific Assessment & Plan notes found for this encounter.   Nelda Balsam, NP 07/30/2023

## 2023-07-31 ENCOUNTER — Ambulatory Visit: Payer: Managed Care, Other (non HMO) | Admitting: Nurse Practitioner

## 2023-11-16 ENCOUNTER — Other Ambulatory Visit: Payer: Self-pay | Admitting: Dermatology

## 2023-11-16 ENCOUNTER — Ambulatory Visit: Payer: Managed Care, Other (non HMO) | Admitting: Dermatology

## 2023-11-16 DIAGNOSIS — L2081 Atopic neurodermatitis: Secondary | ICD-10-CM

## 2023-11-18 NOTE — Patient Instructions (Signed)
 Be Involved in Caring For Your Health:  Taking Medications When medications are taken as directed, they can greatly improve your health. But if they are not taken as prescribed, they may not work. In some cases, not taking them correctly can be harmful. To help ensure your treatment remains effective and safe, understand your medications and how to take them. Bring your medications to each visit for review by your provider.  Your lab results, notes, and after visit summary will be available on My Chart. We strongly encourage you to use this feature. If lab results are abnormal the clinic will contact you with the appropriate steps. If the clinic does not contact you assume the results are satisfactory. You can always view your results on My Chart. If you have questions regarding your health or results, please contact the clinic during office hours. You can also ask questions on My Chart.  We at [facility] are grateful that you chose [place] to provide your care. We strive to provide evidence-based and compassionate care and are always looking for feedback. If you get a survey from the clinic please complete this so we can hear your opinions.  Prediabetes: What to Know Prediabetes is when your blood sugar, also called glucose, is at a higher level than normal but not high enough for you to be diagnosed with type 2 diabetes (type 2 diabetes mellitus). Having prediabetes puts you at risk for getting type 2 diabetes. By making some healthy changes, you may be able to prevent or delay getting type 2 diabetes. This is important because type 2 diabetes can lead to serious problems. Some of these include: Heart disease. Stroke. Blindness. Kidney disease. Depression. Poor blood flow in the feet and legs. In very bad cases, this could lead to having a leg removed by surgery (amputation). What are the causes? The exact cause of prediabetes isn't known. It may result from insulin resistance. Insulin  resistance happens when cells in the body don't respond properly to insulin that the body makes. This can cause too much sugar to build up in the blood. High blood sugar, also called hyperglycemia, can develop. What increases the risk? Having a family member with type 2 diabetes. Being older than 65 years of age. Having had a temporary form of diabetes during a pregnancy. This is called gestational diabetes. Having had polycystic ovary syndrome (PCOS). Being overweight or obese. Being inactive and not getting much exercise. Having a history of heart disease. This may include problems with cholesterol levels, high levels of blood fats, or high blood pressure. What are the signs or symptoms? You may have no symptoms. If you do have symptoms, they may include: Increased hunger. Increased thirst. Needing to pee more often. Changes in how you see, like blurry vision. Feeling tired. How is this diagnosed? Prediabetes can be diagnosed with blood tests that check your blood sugar. One or more of these tests may be done: A fasting blood glucose (FBG) test. You won't be allowed to eat (you will fast) for at least 8 hours before a blood sample is taken. An A1C blood test, also called a hemoglobin A1C test. This test shows information about blood sugar levels over the past 2?3 months. An oral glucose tolerance test (OGTT). This test measures your blood sugar at two points in time: After you haven't eaten for a while. This is your baseline level. Two hours after you drink a beverage that has sugar in it. You may be diagnosed with prediabetes  if: Your FBG is 100?125 mg/dL (1.6-1.0 mmol/L). Your A1C level is 5.7?6.4% (39-46 mmol/mol). Your OGTT result is 140?199 mg/dL (9.6-04 mmol/L). These blood tests may need to be done again to be sure of the diagnosis. How is this treated? Treatment may include making changes to your diet and lifestyle. These changes can help lower your blood sugar and keep you  from getting type 2 diabetes. In some cases, medicine may be given to help lower your risk. Follow these instructions at home: Eating and drinking  Eat and drink as told. Follow a healthy meal plan. This includes eating lean proteins, whole grains, legumes, fresh fruits and vegetables, low-fat dairy products, and healthy fats. Meet with an expert in healthy eating called a dietitian. This person can help create a healthy eating plan that's right for you. Lifestyle Do moderate-intensity exercise. Do this for at least 30 minutes a day on 5 or more days each week, or as told by your health care provider. A mix of activities may be best. Good choices include brisk walking, swimming, biking, and weight lifting. Try to lose weight if your provider says it's OK. Losing 5-7% of your body weight can help reverse insulin resistance. Do not drink alcohol if: Your provider tells you not to drink. You're pregnant, may be pregnant, or plan to become pregnant. If you drink alcohol: Limit how much you have to: 0-1 drink a day if you're female. 0-2 drinks a day if you're female. Know how much alcohol is in your drink. In the [place] one drink is one 12 oz bottle of beer (355 mL), one 5 oz glass of wine (148 mL), or one 1 oz glass of hard liquor (44 mL). General instructions Take medicines only as told. You may be given medicines that help lower the risk of type 2 diabetes. Do not smoke, vape, or use nicotine or tobacco. Where to find more information American Diabetes Association: diabetes.org/about-diabetes/prediabetes Academy of Nutrition and Dietetics: eatright.org American Heart Association: Go to ThisJobs.cz. Click the search icon. Type "prediabetes" in the search box. Contact a health care provider if: You have any of these symptoms: Increased hunger. Peeing more often than usual. Increased thirst. Feeling tired. Changes in how you see, like blurry vision. Feeling like you may throw  up. Throwing up. Get help right away if: You have shortness of breath. You feel confused. This information is not intended to replace advice given to you by your health care provider. Make sure you discuss any questions you have with your health care provider. Document Revised: 09/28/2022 Document Reviewed: 09/28/2022 Elsevier Patient Education  2024 [employer].

## 2023-11-20 ENCOUNTER — Ambulatory Visit (INDEPENDENT_AMBULATORY_CARE_PROVIDER_SITE_OTHER): Payer: Managed Care, Other (non HMO) | Admitting: Nurse Practitioner

## 2023-11-20 ENCOUNTER — Encounter: Payer: Self-pay | Admitting: Nurse Practitioner

## 2023-11-20 VITALS — BP 128/69 | HR 66 | Temp 98.4°F | Resp 16 | Ht 65.0 in | Wt 206.7 lb

## 2023-11-20 DIAGNOSIS — R7303 Prediabetes: Secondary | ICD-10-CM

## 2023-11-20 DIAGNOSIS — E66811 Obesity, class 1: Secondary | ICD-10-CM

## 2023-11-20 DIAGNOSIS — G620 Drug-induced polyneuropathy: Secondary | ICD-10-CM

## 2023-11-20 DIAGNOSIS — I10 Essential (primary) hypertension: Secondary | ICD-10-CM | POA: Diagnosis not present

## 2023-11-20 DIAGNOSIS — J4520 Mild intermittent asthma, uncomplicated: Secondary | ICD-10-CM

## 2023-11-20 DIAGNOSIS — M85851 Other specified disorders of bone density and structure, right thigh: Secondary | ICD-10-CM

## 2023-11-20 DIAGNOSIS — E782 Mixed hyperlipidemia: Secondary | ICD-10-CM

## 2023-11-20 DIAGNOSIS — Z0000 Encounter for general adult medical examination without abnormal findings: Secondary | ICD-10-CM | POA: Diagnosis not present

## 2023-11-20 DIAGNOSIS — Z23 Encounter for immunization: Secondary | ICD-10-CM

## 2023-11-20 DIAGNOSIS — D472 Monoclonal gammopathy: Secondary | ICD-10-CM

## 2023-11-20 DIAGNOSIS — Z853 Personal history of malignant neoplasm of breast: Secondary | ICD-10-CM

## 2023-11-20 DIAGNOSIS — K219 Gastro-esophageal reflux disease without esophagitis: Secondary | ICD-10-CM

## 2023-11-20 LAB — BAYER DCA HB A1C WAIVED: HB A1C (BAYER DCA - WAIVED): 5.9 % — ABNORMAL HIGH (ref 4.8–5.6)

## 2023-11-20 MED ORDER — ATORVASTATIN CALCIUM 10 MG PO TABS: 10.0000 mg | ORAL_TABLET | Freq: Every day | ORAL | 4 refills | Status: DC

## 2023-11-20 MED ORDER — LISINOPRIL 10 MG PO TABS: 10.0000 mg | ORAL_TABLET | Freq: Every day | ORAL | 4 refills | Status: DC

## 2023-11-20 NOTE — Assessment & Plan Note (Signed)
 Ongoing.  A1c 5.9% today, slight trend down.  Initiate medications as needed. Urine ALB 30 February 2025. Continue Lisinopril  for kidney protection.

## 2023-11-20 NOTE — Assessment & Plan Note (Signed)
 Continue to collaborate with oncology and review notes.  Mammogram up to date.

## 2023-11-20 NOTE — Assessment & Plan Note (Signed)
Chronic, ongoing.  Continue collaboration with oncology, recent notes and labs reviewed.  Patient denies any pain today.

## 2023-11-20 NOTE — Assessment & Plan Note (Addendum)
 Chronic, stable with minimal use of Nexium.  Recommend she continue this only as needed.  Mag level as needed.

## 2023-11-20 NOTE — Assessment & Plan Note (Signed)
 Chronic, stable without medication at this time.  Has had side effects with Lyrica and Gabapentin, extreme fatigue.  Check CBC.

## 2023-11-20 NOTE — Assessment & Plan Note (Addendum)
Chronic, stable with minimal use of inhaler.  Continue current medication regimen and adjust as needed.  Spirometry next visit.

## 2023-11-20 NOTE — Assessment & Plan Note (Signed)
 Chronic, ongoing.  Continue current medication regimen and adjust as needed. Lipid panel today.

## 2023-11-20 NOTE — Assessment & Plan Note (Signed)
 Chronic, stable.  Continue supplements daily and educated patient at length on finding.  Check Vitamin D  level today.

## 2023-11-20 NOTE — Progress Notes (Signed)
 BP 128/69 (BP Location: Left Arm, Patient Position: Sitting, Cuff Size: Large)   Pulse 66   Temp 98.4 F (36.9 C) (Oral)   Resp 16   Ht 5' 5 (1.651 m)   Wt 206 lb 11.2 oz (93.8 kg)   LMP  (LMP Unknown)   SpO2 98%   BMI 34.40 kg/m    Subjective:    Patient ID: Adrienne [name], female    DOB: February 26, 1961, 63 y.o.   MRN: [id number]  HPI: Adrienne [name] is a 63 y.o. female presenting on 11/20/2023 for comprehensive medical examination. Current medical complaints include:none  She currently lives with: husband Menopausal Symptoms: no   HYPERTENSION/HLD Continues to take Lisinopril  & Atorvastatin  and ASA.   Hypertension status: controlled  Satisfied with current treatment? yes Duration of hypertension: chronic BP monitoring frequency: every other day BP range: 110 - 120/70 on average BP medication side effects:  no Medication compliance: good compliance Aspirin: yes Recurrent headaches: no Visual changes: no Palpitations: no Dyspnea: no Chest pain: no Lower extremity edema: baseline, no worsening Dizzy/lightheaded: no  The 10-year ASCVD risk score (Arnett DK, et al., 2019) is: 7.3%   Values used to calculate the score:     Age: 63 years     Clincally relevant sex: Female     Is Non-Hispanic African American: Yes     Diabetic: No     Tobacco smoker: No     Systolic Blood Pressure: 128 mmHg     Is BP treated: Yes     HDL Cholesterol: 66 mg/dL     Total Cholesterol: 164 mg/dL  Impaired Fasting Glucose HbA1C:  Lab Results  Component Value Date   HGBA1C 6.1 (H) 05/08/2023  Duration of elevated blood sugar: chronic Polydipsia: no Polyuria: no Weight change: no Visual disturbance: no Glucose Monitoring: no    Accucheck frequency: Not Checking    Fasting glucose:     Post prandial:  Diabetic Education: Not Completed Family history of diabetes: yes mother and father  MGUS + HISTORY OF BREAST CANCER: Follows with oncology, last visit as 07/30/23 and returns in  November. Continues to take gentle iron as recommended by them.  They are monitoring as recent mammograms have shown multiple little cysts. 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 Fatigue: on occasion Decreased exercise tolerance: no  Dyspnea on exertion: no Palpitations: no Bleeding: no Pica: no   OSTEOPENIA DEXA on 12/25/22 last. The BMD measured at Femur Neck Right is 0.802 g/cm2 with a T-score of -1.7, slight change from previous of -1.3. Continues Vitamin D  and multivitamin daily. Adequate calcium  & vitamin D : yes Weight bearing exercises: yes  Adequate calcium  & vitamin D : yes Weight bearing exercises: yes   ASTHMA Using Albuterol  PRN and Claritin PRN. Just a few episodes over past year of needing inhaler. Asthma status: stable Satisfied with current treatment?: yes Albuterol /rescue inhaler frequency: rarely Dyspnea frequency: no Wheezing frequency: no Cough frequency: no Nocturnal symptom frequency: none Limitation of activity: no Current upper respiratory symptoms: no Triggers: seasonal Home peak flows: none Last Spirometry: unknown Failed/intolerant to following asthma meds: none Asthma meds in past: none Aerochamber/spacer use: no Visits to ER or Urgent Care in past year: no Pneumovax: Up to Date Influenza: Up to Date  Depression Screen done today and results listed below:     11/20/2023   11:02 AM 11/04/2022   10:04 AM 05/06/2022    9:39 AM 11/01/2021    1:08 PM 10/01/2020    8:12 AM  Depression screen PHQ 2/9  Decreased Interest 0 0 0 0 0  Down, Depressed, Hopeless 0 0 0 0 0  PHQ - 2 Score 0 0 0 0 0  Altered sleeping 0 1 0 0   Tired, decreased energy 1 0 1 1   Change in appetite 0 0 0 0   Feeling bad or failure about yourself  0 0 0 0   Trouble concentrating 0 0 0 1   Moving slowly or fidgety/restless 0 0 0 0   Suicidal thoughts 0 0 0 0   PHQ-9 Score 1 1 1 2    Difficult doing work/chores  Not difficult at all Not difficult at all Not difficult at all        11/20/2023   11:03 AM 11/04/2022   10:05 AM 05/06/2022    9:39 AM 11/01/2021    1:08 PM  GAD 7 : Generalized Anxiety Score  Nervous, Anxious, on Edge 0 0 1 1  Control/stop worrying 0 0 0 0  Worry too much - different things 0 0 0 1  Trouble relaxing 0 0 0 1  Restless 0 0 1 0  Easily annoyed or irritable 0 0 1 1  Afraid - awful might happen 0 0 0 0  Total GAD 7 Score 0 0 3 4  Anxiety Difficulty Not difficult at all Not difficult at all Not difficult at all Not difficult at all      11/01/2021    1:07 PM 05/06/2022    9:39 AM 11/04/2022   10:05 AM 11/20/2023   10:26 AM 11/20/2023   11:03 AM  Fall Risk  Falls in the past year? 0 0 1 0 0  Was there an injury with Fall? 0 0 0 0 0  Fall Risk Category Calculator 0 0 1 0 0  Fall Risk Category (Retired) Low       (RETIRED) Patient Fall Risk Level Low fall risk       Patient at Risk for Falls Due to No Fall Risks No Fall Risks History of fall(s) No Fall Risks No Fall Risks  Fall risk Follow up Falls evaluation completed  Falls evaluation completed Education provided Falls evaluation completed      Data saved with a previous flowsheet row definition     Functional Status Survey: Is the patient deaf or have difficulty hearing?: No Does the patient have difficulty seeing, even when wearing glasses/contacts?: No Does the patient have difficulty concentrating, remembering, or making decisions?: No Does the patient have difficulty walking or climbing stairs?: No Does the patient have difficulty dressing or bathing?: No Does the patient have difficulty doing errands alone such as visiting a doctor's office or shopping?: No   Past Medical History:  Past Medical History:  Diagnosis Date   Allergy    Anemia    Asthma    BRCA negative 09/22/2014   Breast cancer (HCC) 2012   LT LUMPECTOMY   Breast cancer (HCC) 2016   RT LUMPECTOMY   Breast screening, unspecified    Constipation    Dry eyes 2012   Eczema    Family history of breast  cancer    Family history of ovarian cancer    Family history of prostate cancer    GERD (gastroesophageal reflux disease)    Hematuria    Kidney stone    Lump or mass in breast    Malignant neoplasm of breast (female), unspecified site 07/08/2014   Right breast, 5 mm, T1a,N0, triple negative;  high risk for recurrence on Mammoprint.   Malignant neoplasm of upper-outer quadrant of female breast (HCC) 05/08/2010   Left breast: T1b, N0, M); triple negative, DCIS present. No chemotherapy. MammoSite radiation.   Neuropathy    pt states in fingertips and feet   Obesity, unspecified    Pancreatic lesion    Personal history of chemotherapy 2016   BREAST CA- Right   Personal history of malignant neoplasm of breast 2012   has been treated with left breast wide excision and radiation therapy; Patient has DCIS as well as a 7 mm infiltrating mammary carcinoma triple negative removed on 05-08-10   Personal history of radiation therapy 2012   BREAST CA - MAMMOSITE- Left   Personal history of radiation therapy 2016   BREAST CA - MAMMOSITE- Right   Screening for obesity    Seasonal allergies    Special screening for malignant neoplasms, colon    Unspecified essential hypertension     Surgical History:  Past Surgical History:  Procedure Laterality Date   BREAST BIOPSY Right 2016   Invasive   BREAST CYST ASPIRATION Left 2010   BREAST LUMPECTOMY Left 2012   BREAST CA   BREAST LUMPECTOMY Right 2016   INVASIVE MAMMARY CARCINOMA   BREAST LUMPECTOMY WITH AXILLARY LYMPH NODE BIOPSY Right 08/01/2014   Procedure: BREAST LUMPECTOMY WITH AXILLARY LYMPH NODE BIOPSY;  Surgeon: Reyes LELON Cota, MD;  Location: [facility];  Service: General;  Laterality: Right;   BREAST MAMMOSITE  April 2012   mammosite placement and removal    BREAST SURGERY Left 2012   left breast wide excision, sentinel node, MammoSite   BREAST SURGERY Right 08/06/2014   Wide excision/sentinel node biopsy, T1a, N0 triple negative,  MammoSite   CESAREAN SECTION  1988   COLONOSCOPY  October 17, 2009   Dr. Christ, normal study.   KIDNEY STONE SURGERY  2003   stones removed by Dr. Toribio- stent placed/removed   PORTACATH PLACEMENT Left 11/09/2014   Procedure: INSERTION PORT-A-CATH;  Surgeon: Reyes LELON Cota, MD;  Location: [facility];  Service: General;  Laterality: Left;   SENTINEL NODE BIOPSY Right 08/01/2014   Procedure: SENTINEL NODE BIOPSY;  Surgeon: Reyes LELON Cota, MD;  Location: [facility];  Service: General;  Laterality: Right;   TUBAL LIGATION      Medications:  Current Outpatient Medications on File Prior to Visit  Medication Sig   albuterol  (VENTOLIN  HFA) 108 (90 Base) MCG/ACT inhaler Inhale 2 puffs into the lungs every 6 (six) hours as needed for wheezing or shortness of breath.   aspirin 81 MG tablet Take 81 mg by mouth daily.   bisacodyl (DULCOLAX) 5 MG EC tablet Take 5 mg by mouth daily as needed for moderate constipation.   Cholecalciferol (VITAMIN D3) 1000 UNITS CAPS Take 1 capsule by mouth daily.    clobetasol  (TEMOVATE ) 0.05 % external solution Apply 1 application topically daily. Mix bottle in 1 tub of cerave cream and use qd to trunk, arms and legs, avoid face, groin, axilla   DUPIXENT  300 MG/2ML SOAJ INJECT 1 PEN SUBCUTANEOUSLY  EVERY OTHER WEEK   esomeprazole (NEXIUM) 40 MG capsule Take 40 mg by mouth 2 (two) times daily as needed (acid reflux).    loratadine (CLARITIN) 10 MG tablet Take 10 mg by mouth daily as needed. Reported on 04/13/2015   polyethylene glycol (MIRALAX / GLYCOLAX) packet Take 17 g by mouth every other day.   potassium chloride  SA (KLOR-CON  M) 20 MEQ tablet TAKE 1 TABLET BY  MOUTH EVERY DAY   tacrolimus  (PROTOPIC ) 0.1 % ointment Spot treat affected areas on skin once or twice a day   Current Facility-Administered Medications on File Prior to Visit  Medication   sodium chloride  0.9 % injection 10 mL    Allergies:  Allergies  Allergen Reactions   Cayenne Pepper [Cayenne]  Shortness Of Breath   Shellfish Allergy Other (See Comments)    Hives and swelling on ears and feet   Tape Rash    Surgical tape from breast biopsy    Social History:  Social History   Socioeconomic History   Marital status: Single    Spouse name: Not on file   Number of children: Not on file   Years of education: Not on file   Highest education level: Master's degree (e.g., MA, MS, MEng, MEd, MSW, MBA)  Occupational History   Not on file  Tobacco Use   Smoking status: Never   Smokeless tobacco: Never  Vaping Use   Vaping status: Never Used  Substance and Sexual Activity   Alcohol use: Yes    Comment: occasionally   Drug use: No   Sexual activity: Yes    Birth control/protection: Post-menopausal  Other Topics Concern   Not on file  Social History Narrative   Not on file   Social Drivers of Health   Financial Resource Strain: Low Risk  (11/18/2023)   Overall Financial Resource Strain (CARDIA)    Difficulty of Paying Living Expenses: Not very hard  Food Insecurity: No Food Insecurity (11/18/2023)   Hunger Vital Sign    Worried About Running Out of Food in the Last Year: Never true    Ran Out of Food in the Last Year: Never true  Transportation Needs: No Transportation Needs (11/18/2023)   PRAPARE - Administrator, Civil Service (Medical): No    Lack of Transportation (Non-Medical): No  Physical Activity: Insufficiently Active (11/18/2023)   Exercise Vital Sign    Days of Exercise per Week: 3 days    Minutes of Exercise per Session: 30 min  Stress: No Stress Concern Present (11/18/2023)   [employer] of Occupational Health - Occupational Stress Questionnaire    Feeling of Stress: Only a little  Social Connections: Socially Isolated (11/18/2023)   Social Connection and Isolation Panel    Frequency of Communication with Friends and Family: Once a week    Frequency of Social Gatherings with Friends and Family: Once a week    Attends Religious Services:  Never    Database administrator or Organizations: No    Attends Engineer, structural: Not on file    Marital Status: Living with partner  Intimate Partner Violence: Not At Risk (11/20/2023)   Humiliation, Afraid, Rape, and Kick questionnaire    Fear of Current or Ex-Partner: No    Emotionally Abused: No    Physically Abused: No    Sexually Abused: No   Social History   Tobacco Use  Smoking Status Never  Smokeless Tobacco Never   Social History   Substance and Sexual Activity  Alcohol Use Yes   Comment: occasionally    Family History:  Family History  Problem Relation Age of Onset   Ovarian cancer Mother    Diabetes Mother    Arthritis Mother    Diabetes Father    Prostate cancer Father        dx in his 59s   Alcohol abuse Father    Hypertension Brother  Cancer Maternal Grandmother        bone   Cancer Maternal Grandfather        lung   Cancer Paternal Grandmother        lung   Asthma Paternal Grandfather    Hypertension Sister    Prostate cancer Brother 1   AAA (abdominal aortic aneurysm) Maternal Uncle    Breast cancer Other    Past medical history, surgical history, medications, allergies, family history and social history reviewed with patient today and changes made to appropriate areas of the chart.   Review of Systems - negative All other ROS negative except what is listed above and in the HPI.      Objective:    BP 128/69 (BP Location: Left Arm, Patient Position: Sitting, Cuff Size: Large)   Pulse 66   Temp 98.4 F (36.9 C) (Oral)   Resp 16   Ht 5' 5 (1.651 m)   Wt 206 lb 11.2 oz (93.8 kg)   LMP  (LMP Unknown)   SpO2 98%   BMI 34.40 kg/m   Wt Readings from Last 3 Encounters:  11/20/23 206 lb 11.2 oz (93.8 kg)  07/30/23 207 lb 12.8 oz (94.3 kg)  05/08/23 207 lb 9.6 oz (94.2 kg)    Physical Exam Vitals and nursing note reviewed.  Constitutional:      General: She is awake. She is not in acute distress.    Appearance: She is  well-developed. She is not ill-appearing.  HENT:     Head: Normocephalic and atraumatic.     Right Ear: Hearing, tympanic membrane, ear canal and external ear normal. No drainage.     Left Ear: Hearing, tympanic membrane, ear canal and external ear normal. No drainage.     Nose: Nose normal.     Right Sinus: No maxillary sinus tenderness or frontal sinus tenderness.     Left Sinus: No maxillary sinus tenderness or frontal sinus tenderness.     Mouth/Throat:     Mouth: Mucous membranes are moist.     Pharynx: Oropharynx is clear. Uvula midline. No pharyngeal swelling, oropharyngeal exudate or posterior oropharyngeal erythema.  Eyes:     General: Lids are normal.        Right eye: No discharge.        Left eye: No discharge.     Extraocular Movements: Extraocular movements intact.     Conjunctiva/sclera: Conjunctivae normal.     Pupils: Pupils are equal, round, and reactive to light.     Visual Fields: Right eye visual fields normal and left eye visual fields normal.  Neck:     Thyroid: No thyromegaly.     Vascular: No carotid bruit.     Trachea: Trachea normal.  Cardiovascular:     Rate and Rhythm: Normal rate and regular rhythm.     Heart sounds: Normal heart sounds. No murmur heard.    No gallop.  Pulmonary:     Effort: Pulmonary effort is normal. No accessory muscle usage or respiratory distress.     Breath sounds: Normal breath sounds.  Chest:     Comments: Deferred due to recent exams. Abdominal:     General: Bowel sounds are normal.     Palpations: Abdomen is soft. There is no hepatomegaly or splenomegaly.     Tenderness: There is no abdominal tenderness.  Genitourinary:    Vagina: Normal.     Cervix: Normal.     Uterus: Normal.      Adnexa: Right adnexa  normal.     Rectum: Normal.  Musculoskeletal:        General: Normal range of motion.     Cervical back: Normal range of motion and neck supple.     Right lower leg: No edema.     Left lower leg: No edema.   Lymphadenopathy:     Head:     Right side of head: No submental, submandibular, tonsillar, preauricular or posterior auricular adenopathy.     Left side of head: No submental, submandibular, tonsillar, preauricular or posterior auricular adenopathy.     Cervical: No cervical adenopathy.  Skin:    General: Skin is warm and dry.     Capillary Refill: Capillary refill takes less than 2 seconds.     Findings: No rash.  Neurological:     Mental Status: She is alert and oriented to person, place, and time.     Gait: Gait is intact.     Deep Tendon Reflexes: Reflexes are normal and symmetric.     Reflex Scores:      Brachioradialis reflexes are 2+ on the right side and 2+ on the left side.      Patellar reflexes are 2+ on the right side and 2+ on the left side. Psychiatric:        Attention and Perception: Attention normal.        Mood and Affect: Mood normal.        Speech: Speech normal.        Behavior: Behavior normal. Behavior is cooperative.        Thought Content: Thought content normal.        Judgment: Judgment normal.    Results for orders placed or performed in visit on 07/17/23  Vitamin B12   Collection Time: 07/17/23  8:59 AM  Result Value Ref Range   Vitamin B-12 912 180 - 914 pg/mL      Assessment & Plan:   Problem List Items Addressed This Visit       Cardiovascular and Mediastinum   Hypertension   Chronic, stable.  BP at goal for age at home and in office.  Continue current medication regimen and adjust as needed.  Recommend she continue to monitor BP regularly at home and document + focus on DASH diet.  LABS: CMP, TSH, and urine ALB.  Urine ALB 30 February 2025.  Return to office in 6 months.      Relevant Medications   atorvastatin  (LIPITOR) 10 MG tablet   lisinopril  (ZESTRIL ) 10 MG tablet   Other Relevant Orders   TSH     Respiratory   Asthma   Chronic, stable with minimal use of inhaler.  Continue current medication regimen and adjust as needed.   Spirometry next visit.        Digestive   GERD without esophagitis   Chronic, stable with minimal use of Nexium.  Recommend she continue this only as needed.  Mag level as needed.        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  Has had side effects with Lyrica  and Gabapentin , extreme fatigue.  Check CBC.        Musculoskeletal and Integument   Osteopenia of neck of right femur   Chronic, stable.  Continue supplements daily and educated patient at length on finding.  Check Vitamin D  level today.      Relevant Orders   VITAMIN D   25 Hydroxy (Vit-D Deficiency, Fractures)     Other   Prediabetes   Ongoing.  A1c 5.9% today, slight trend down.  Initiate medications as needed. Urine ALB 30 February 2025. Continue Lisinopril  for kidney protection.      Relevant Orders   Bayer DCA Hb A1c Waived   Comprehensive metabolic panel with GFR   Obesity   BMI 34.40. Recommended eating smaller high protein, low fat meals more frequently and exercising 30 mins a day 5 times a week with a goal of 10-15lb weight loss in the next 3 months. Patient voiced their understanding and motivation to adhere to these recommendations.       MGUS (monoclonal gammopathy of unknown significance)   Chronic, ongoing.  Continue collaboration with oncology, recent notes and labs reviewed.  Patient denies any pain today.      Hyperlipemia, mixed   Chronic, ongoing.  Continue current medication regimen and adjust as needed.  Lipid panel today.      Relevant Medications   atorvastatin  (LIPITOR) 10 MG tablet   lisinopril  (ZESTRIL ) 10 MG tablet   Other Relevant Orders   Comprehensive metabolic panel with GFR   Lipid Panel w/o Chol/HDL Ratio   History of breast cancer   Continue to collaborate with oncology and review notes.  Mammogram up to date.      Other Visit Diagnoses       Flu vaccine need       Flu vaccine  today, educated patient.     Encounter for annual physical exam       Annual physical today, health maintenance reviewed.        Follow up plan: Return in about 6 months (around 05/19/2024) for HTN/HLD, IFG.   LABORATORY TESTING:  - Pap smear: Up To Date  IMMUNIZATIONS:   - Tdap: Tetanus vaccination status reviewed: last tetanus booster within 10 years. - Influenza: Up To Date - Pneumovax: Up to date - Prevnar: Up To Date - HPV: Not applicable - Zostavax vaccine: Up to date  SCREENING: -Mammogram: Up to date  - Colonoscopy: Up to date  - Bone Density: Up To Date  -Hearing Test: Not applicable  -Spirometry: Not applicable   PATIENT COUNSELING:   Advised to take 1 mg of folate supplement per day if capable of pregnancy.   Sexuality: Discussed sexually transmitted diseases, partner selection, use of condoms, avoidance of unintended pregnancy  and contraceptive alternatives.   Advised to avoid cigarette smoking.  I discussed with the patient that most people either abstain from alcohol or drink within safe limits (<=14/week and <=4 drinks/occasion for males, <=7/weeks and <= 3 drinks/occasion for females) and that the risk for alcohol disorders and other health effects rises proportionally with the number of drinks per week and how often a drinker exceeds daily limits.  Discussed cessation/primary prevention of drug use and availability of treatment for abuse.   Diet: Encouraged to adjust caloric intake to maintain  or achieve ideal body weight, to reduce intake of dietary saturated fat and total fat, to limit sodium intake by avoiding high sodium foods and not adding table salt, and to maintain adequate dietary potassium and calcium  preferably from fresh fruits, vegetables, and low-fat dairy products.    Stressed the importance of regular exercise  Injury prevention: Discussed safety belts, safety helmets, smoke detector, smoking near bedding or upholstery.   Dental health:  Discussed importance of regular tooth brushing, flossing, and dental visits.    NEXT PREVENTATIVE PHYSICAL DUE IN 1  YEAR. Return in about 6 months (around 05/19/2024) for HTN/HLD, IFG.

## 2023-11-20 NOTE — Assessment & Plan Note (Addendum)
 BMI 34.40. Recommended eating smaller high protein, low fat meals more frequently and exercising 30 mins a day 5 times a week with a goal of 10-15lb weight loss in the next 3 months. Patient voiced their understanding and motivation to adhere to these recommendations.

## 2023-11-20 NOTE — Assessment & Plan Note (Deleted)
Followed by oncology, continue this collaboration.  Recent notes reviewed. ?

## 2023-11-20 NOTE — Assessment & Plan Note (Signed)
 Chronic, stable.  BP at goal for age at home and in office.  Continue current medication regimen and adjust as needed.  Recommend she continue to monitor BP regularly at home and document + focus on DASH diet.  LABS: CMP, TSH, and urine ALB.  Urine ALB 30 February 2025.  Return to office in 6 months.

## 2023-11-21 ENCOUNTER — Ambulatory Visit: Payer: Self-pay | Admitting: Nurse Practitioner

## 2023-11-21 DIAGNOSIS — N1831 Chronic kidney disease, stage 3a: Secondary | ICD-10-CM

## 2023-11-21 LAB — COMPREHENSIVE METABOLIC PANEL WITH GFR
ALT: 15 IU/L (ref 0–32)
AST: 19 IU/L (ref 0–40)
Albumin: 4.1 g/dL (ref 3.9–4.9)
Alkaline Phosphatase: 95 IU/L (ref 44–121)
BUN/Creatinine Ratio: 24 (ref 12–28)
BUN: 30 mg/dL — ABNORMAL HIGH (ref 8–27)
Bilirubin Total: 0.2 mg/dL (ref 0.0–1.2)
CO2: 21 mmol/L (ref 20–29)
Calcium: 9.3 mg/dL (ref 8.7–10.3)
Chloride: 103 mmol/L (ref 96–106)
Creatinine, Ser: 1.26 mg/dL — ABNORMAL HIGH (ref 0.57–1.00)
Globulin, Total: 3 g/dL (ref 1.5–4.5)
Glucose: 80 mg/dL (ref 70–99)
Potassium: 4.2 mmol/L (ref 3.5–5.2)
Sodium: 139 mmol/L (ref 134–144)
Total Protein: 7.1 g/dL (ref 6.0–8.5)
eGFR: 47 mL/min/1.73 — ABNORMAL LOW (ref >59)

## 2023-11-21 LAB — LIPID PANEL W/O CHOL/HDL RATIO
Cholesterol, Total: 155 mg/dL (ref 100–199)
HDL: 64 mg/dL (ref >39)
LDL Chol Calc (NIH): 81 mg/dL (ref 0–99)
Triglycerides: 47 mg/dL (ref 0–149)
VLDL Cholesterol Cal: 10 mg/dL (ref 5–40)

## 2023-11-21 LAB — TSH: TSH: 1.59 u[IU]/mL (ref 0.450–4.500)

## 2023-11-21 LAB — VITAMIN D 25 HYDROXY (VIT D DEFICIENCY, FRACTURES): Vit D, 25-Hydroxy: 44.2 ng/mL (ref 30.0–100.0)

## 2023-11-21 NOTE — Progress Notes (Signed)
 Contacted via MyChart -- lab only visit in 4 weeks please The 10-year ASCVD risk score (Arnett DK, et al., 2019) is: 7%   Values used to calculate the score:     Age: 63 years     Clincally relevant sex: Female     Is Non-Hispanic African American: Yes     Diabetic: No     Tobacco smoker: No     Systolic Blood Pressure: 128 mmHg     Is BP treated: Yes     HDL Cholesterol: 64 mg/dL     Total Cholesterol: 155 mg/dL  Good morning Adrienne [name], your labs have returned and overall are stable with exception of kidney function, creatinine and eGFR, which is showing some downward trend.  I would like to recheck this outpatient in 4 weeks, until that time ensure you are drinking plenty of water daily and avoid Ibuprofen products. - Lipid panel stable, but would be good to see LDL (bad cholesterol) get below 70.  I would recommend increasing Atorvastatin  to 20 MG daily and stopping 10 MG. Would you be okay with trying this? Let me know.  Any questions? Keep being stellar!!  Thank you for allowing me to participate in your care.  I appreciate you. Kindest regards, Virgilene Stryker

## 2023-11-23 MED ORDER — ATORVASTATIN CALCIUM 20 MG PO TABS: 20.0000 mg | ORAL_TABLET | Freq: Every day | ORAL | 3 refills | Status: AC

## 2023-11-23 NOTE — Progress Notes (Signed)
 Scheduled

## 2023-12-10 ENCOUNTER — Other Ambulatory Visit: Payer: Self-pay | Admitting: Nurse Practitioner

## 2023-12-10 DIAGNOSIS — I10 Essential (primary) hypertension: Secondary | ICD-10-CM

## 2023-12-11 NOTE — Telephone Encounter (Signed)
 Refilled 11/20/23 # 90 with 4 reills Requested Prescriptions  Refused Prescriptions Disp Refills   lisinopril  (ZESTRIL ) 10 MG tablet [Pharmacy Med Name: Lisinopril  10 MG Oral Tablet] 90 tablet 3    Sig: TAKE 1 TABLET BY MOUTH DAILY     Cardiovascular:  ACE Inhibitors Failed - 12/11/2023 11:49 AM      Failed - Cr in normal range and within 180 days    Creatinine  Date Value Ref Range Status  07/17/2023 0.88 0.44 - 1.00 mg/dL Final  94/91/7986 9.04 0.60 - 1.30 mg/dL Final   Creatinine, Ser  Date Value Ref Range Status  11/20/2023 1.26 (H) 0.57 - 1.00 mg/dL Final         Passed - K in normal range and within 180 days    Potassium  Date Value Ref Range Status  11/20/2023 4.2 3.5 - 5.2 mmol/L Final  07/16/2011 3.7 3.5 - 5.1 mmol/L Final         Passed - Patient is not pregnant      Passed - Last BP in normal range    BP Readings from Last 1 Encounters:  11/20/23 128/69         Passed - Valid encounter within last 6 months    Recent Outpatient Visits           3 weeks ago Peripheral neuropathy due to chemotherapy   [place] [facility] Ruch, Melanie T, NP   7 months ago Peripheral neuropathy due to chemotherapy   [place] [facility] Valerio [name], NP

## 2023-12-25 ENCOUNTER — Other Ambulatory Visit

## 2023-12-25 DIAGNOSIS — N1831 Chronic kidney disease, stage 3a: Secondary | ICD-10-CM

## 2023-12-26 LAB — BASIC METABOLIC PANEL WITH GFR
BUN/Creatinine Ratio: 23 (ref 12–28)
BUN: 23 mg/dL (ref 8–27)
CO2: 22 mmol/L (ref 20–29)
Calcium: 9.7 mg/dL (ref 8.7–10.3)
Chloride: 102 mmol/L (ref 96–106)
Creatinine, Ser: 1 mg/dL (ref 0.57–1.00)
Glucose: 91 mg/dL (ref 70–99)
Potassium: 4.1 mmol/L (ref 3.5–5.2)
Sodium: 139 mmol/L (ref 134–144)
eGFR: 63 mL/min/1.73 (ref >59)

## 2023-12-27 ENCOUNTER — Ambulatory Visit: Payer: Self-pay | Admitting: Nurse Practitioner

## 2023-12-27 NOTE — Progress Notes (Signed)
 Contacted via MyChart  Great news Adrienne [name], your kidney function is now in normal range. Great news!!  Continue good hydration at home and minimal Ibuprofen use. Any questions? Keep being amazing!!  Thank you for allowing me to participate in your care.  I appreciate you. Kindest regards, Tameria Patti

## 2024-01-15 ENCOUNTER — Inpatient Hospital Stay: Attending: Internal Medicine

## 2024-01-15 DIAGNOSIS — E669 Obesity, unspecified: Secondary | ICD-10-CM | POA: Diagnosis not present

## 2024-01-15 DIAGNOSIS — D472 Monoclonal gammopathy: Secondary | ICD-10-CM | POA: Diagnosis present

## 2024-01-15 DIAGNOSIS — E876 Hypokalemia: Secondary | ICD-10-CM | POA: Insufficient documentation

## 2024-01-15 DIAGNOSIS — M8580 Other specified disorders of bone density and structure, unspecified site: Secondary | ICD-10-CM | POA: Diagnosis not present

## 2024-01-15 DIAGNOSIS — G629 Polyneuropathy, unspecified: Secondary | ICD-10-CM | POA: Insufficient documentation

## 2024-01-15 DIAGNOSIS — D649 Anemia, unspecified: Secondary | ICD-10-CM | POA: Diagnosis not present

## 2024-01-15 DIAGNOSIS — Z853 Personal history of malignant neoplasm of breast: Secondary | ICD-10-CM | POA: Diagnosis not present

## 2024-01-15 LAB — CMP (CANCER CENTER ONLY)
ALT: 19 U/L (ref 0–44)
AST: 19 U/L (ref 15–41)
Albumin: 3.8 g/dL (ref 3.5–5.0)
Alkaline Phosphatase: 95 U/L (ref 38–126)
Anion gap: 8 (ref 5–15)
BUN: 19 mg/dL (ref 8–23)
CO2: 23 mmol/L (ref 22–32)
Calcium: 9.1 mg/dL (ref 8.9–10.3)
Chloride: 104 mmol/L (ref 98–111)
Creatinine: 1.02 mg/dL — ABNORMAL HIGH (ref 0.44–1.00)
GFR, Estimated: >60 mL/min (ref >60)
Glucose, Bld: 96 mg/dL (ref 70–99)
Potassium: 3.5 mmol/L (ref 3.5–5.1)
Sodium: 135 mmol/L (ref 135–145)
Total Bilirubin: 0.7 mg/dL (ref 0.0–1.2)
Total Protein: 8.1 g/dL (ref 6.5–8.1)

## 2024-01-15 LAB — CBC WITH DIFFERENTIAL (CANCER CENTER ONLY)
Abs Immature Granulocytes: 0.03 K/uL (ref 0.00–0.07)
Basophils Absolute: 0 K/uL (ref 0.0–0.1)
Basophils Relative: 0 %
Eosinophils Absolute: 0.2 K/uL (ref 0.0–0.5)
Eosinophils Relative: 3 %
HCT: 28.7 % — ABNORMAL LOW (ref 36.0–46.0)
Hemoglobin: 9.6 g/dL — ABNORMAL LOW (ref 12.0–15.0)
Immature Granulocytes: 0 %
Lymphocytes Relative: 32 %
Lymphs Abs: 2.2 K/uL (ref 0.7–4.0)
MCH: 32.3 pg (ref 26.0–34.0)
MCHC: 33.4 g/dL (ref 30.0–36.0)
MCV: 96.6 fL (ref 80.0–100.0)
Monocytes Absolute: 0.6 K/uL (ref 0.1–1.0)
Monocytes Relative: 9 %
Neutro Abs: 3.9 K/uL (ref 1.7–7.7)
Neutrophils Relative %: 56 %
Platelet Count: 209 K/uL (ref 150–400)
RBC: 2.97 MIL/uL — ABNORMAL LOW (ref 3.87–5.11)
RDW: 13.6 % (ref 11.5–15.5)
WBC Count: 7 K/uL (ref 4.0–10.5)
nRBC: 0 % (ref 0.0–0.2)

## 2024-01-15 LAB — LACTATE DEHYDROGENASE: LDH: 141 U/L (ref 98–192)

## 2024-01-15 LAB — VITAMIN B12: Vitamin B-12: 1403 pg/mL — ABNORMAL HIGH (ref 180–914)

## 2024-01-15 LAB — FOLATE: Folate: >20 ng/mL (ref >5.9)

## 2024-01-15 LAB — VITAMIN D 25 HYDROXY (VIT D DEFICIENCY, FRACTURES): Vit D, 25-Hydroxy: 55.34 ng/mL (ref 30–100)

## 2024-01-17 LAB — CANCER ANTIGEN 27.29: CA 27.29: 21.6 U/mL (ref 0.0–38.6)

## 2024-01-18 LAB — KAPPA/LAMBDA LIGHT CHAINS
Kappa free light chain: 39.8 mg/L — ABNORMAL HIGH (ref 3.3–19.4)
Kappa, lambda light chain ratio: 2.47 — ABNORMAL HIGH (ref 0.26–1.65)
Lambda free light chains: 16.1 mg/L (ref 5.7–26.3)

## 2024-01-21 LAB — MULTIPLE MYELOMA PANEL, SERUM
Albumin SerPl Elph-Mcnc: 3.5 g/dL (ref 2.9–4.4)
Albumin/Glob SerPl: 0.9 (ref 0.7–1.7)
Alpha 1: 0.3 g/dL (ref 0.0–0.4)
Alpha2 Glob SerPl Elph-Mcnc: 0.9 g/dL (ref 0.4–1.0)
B-Globulin SerPl Elph-Mcnc: 1.1 g/dL (ref 0.7–1.3)
Gamma Glob SerPl Elph-Mcnc: 1.7 g/dL (ref 0.4–1.8)
Globulin, Total: 3.9 g/dL (ref 2.2–3.9)
IgA: 151 mg/dL (ref 87–352)
IgG (Immunoglobin G), Serum: 2072 mg/dL — ABNORMAL HIGH (ref 586–1602)
IgM (Immunoglobulin M), Srm: 40 mg/dL (ref 26–217)
M Protein SerPl Elph-Mcnc: 1.2 g/dL — ABNORMAL HIGH
Total Protein ELP: 7.4 g/dL (ref 6.0–8.5)

## 2024-01-28 NOTE — Progress Notes (Signed)
 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   # 2012 LEFT BREAST CA STAGE I s/p Lumpec [T1 (0.7CM);Triple Neg; G-3]; s/p mammosite RT; No adj chemo  #  Mild Anemia/M-protein 0.8gm/dl [sep 7983]  # BRCA- 1&2 NEGATIVE [july 2016]; s/p Genetic counseling [June 2020]  # PN s/p acupuncture  --------------------------------------------------     DIAGNOSIS: breast cancer  STAGE:   I      ;GOALS: cure  CURRENT/MOST RECENT THERAPY: surveillaince     History of breast cancer  08/31/2018 Genetic Testing   Two VUS identified on the CustomNext-Cancer+RNAinsight.  CFTR p.R450I and CTNNA1 p.L402S VUS.  The CustomNext-Expanded gene panel offered by [facility] and includes sequencing and rearrangement analysis for the following 81 genes: AIP, ALK, APC*, ATM*, AXIN2, BAP1, BARD1, BLM, BMPR1A, BRCA1*, BRCA2*, BRIP1*, CDC73, CDH1*, CDK4, CDKN1B, CDKN2A, CHEK2*, CTNNA1, DICER1, FANCC, FH, FLCN, GALNT12, HOXB13, KIT, MAX, MEN1, MET, MLH1*, MRE11A, MSH2*, MSH6*, MUTYH*, NBN, NF1*, NF2, NTHL1, PALB2*, PDGFRA, PHOX2B, PMS2*, POLD1, POLE, POT1, PRKAR1A, PTCH1, PTEN*, RAD50, RAD51C*, RAD51D*, RB1, RET, SDHA, SDHAF2, SDHB, SDHC, SDHD, SMAD4, SMARCA4, SMARCB1, SMARCE1, STK11, SUFU, TMEM127, TP53*, TSC1, TSC2, VHL and XRCC2 (sequencing and deletion/duplication); CASR, CFTR, CPA1, CTRC, EGFR, MITF, PRSS1 and SPINK1 (sequencing only); EPCAM and GREM1 (deletion/duplication only). DNA and RNA analyses performed for * genes. The report date is September 01, 2018.     INTERVAL HISTORY: Alone. Ambulating independently.  A pleasant 63 y.o. 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 She reports feeling fatigued.  She reports feeling dizzy and unbalanced at work sometimes.  She also reports having some intermittent nausea without vomiting.  She denies any in bowel habits or patterns, no changes in appetite or weight loss, she reports sleeping good.  No new or worsening pain.  No fever chills night sweats.  Today her mouth and lips look really dry.  She reports that she has not been drinking enough water.  I encouraged her on the importance of staying hydrated and that it would make her feel bad overall and could cause some dizziness.  She verbalizes understanding.  We discussed all of her blood work.  Her CA 27.29 had trended up just a little, patient reports that this makes her nervous.  We discussed that it is just a tool that we use and we will continue to monitor.  She verbalizes understanding.  Would feel better if we repeat his levels sooner than 6 months.  I agreed to repeat in 3 months.  No new masses or bumps. No unintentional weight loss or bone pain. No skin changes or pain of breast.   She did have her mammogram in April which showed no concerns for recurrence.  All other labs stable at this time.  Review of Systems  Constitutional:  Negative for chills, diaphoresis, fever, malaise/fatigue and weight loss.  HENT:  Negative for nosebleeds and sore throat.   Eyes:  Negative for double vision.  Respiratory:  Negative for cough, hemoptysis,  sputum production, shortness of breath and wheezing.   Cardiovascular:  Negative for chest pain, palpitations, orthopnea and leg swelling.  Gastrointestinal:  Negative for abdominal pain, blood in stool, constipation, diarrhea, heartburn, melena, nausea and vomiting.  Genitourinary:  Negative for  dysuria, frequency and urgency.  Musculoskeletal:  Positive for joint pain. Negative for back pain.  Skin: Negative.  Negative for itching and rash.  Neurological:  Positive for tingling. Negative for dizziness, focal weakness, weakness and headaches.  Endo/Heme/Allergies:  Does not bruise/bleed easily.  Psychiatric/Behavioral:  Negative for depression. The patient is not nervous/anxious and does not have insomnia.    PAST MEDICAL HISTORY :  Past Medical History:  Diagnosis Date   Allergy    Anemia    Asthma    BRCA negative 09/22/2014   Breast cancer (HCC) 2012   LT LUMPECTOMY   Breast cancer (HCC) 2016   RT LUMPECTOMY   Breast screening, unspecified    Constipation    Dry eyes 2012   Eczema    Family history of breast cancer    Family history of ovarian cancer    Family history of prostate cancer    GERD (gastroesophageal reflux disease)    Hematuria    Kidney stone    Lump or mass in breast    Malignant neoplasm of breast (female), unspecified site 07/08/2014   Right breast, 5 mm, T1a,N0, triple negative; high risk for recurrence on Mammoprint.   Malignant neoplasm of upper-outer quadrant of female breast (HCC) 05/08/2010   Left breast: T1b, N0, M); triple negative, DCIS present. No chemotherapy. MammoSite radiation.   Neuropathy    pt states in fingertips and feet   Obesity, unspecified    Pancreatic lesion    Personal history of chemotherapy 2016   BREAST CA- Right   Personal history of malignant neoplasm of breast 2012   has been treated with left breast wide excision and radiation therapy; Patient has DCIS as well as a 7 mm infiltrating mammary carcinoma triple negative removed on 05-08-10   Personal history of radiation therapy 2012   BREAST CA - MAMMOSITE- Left   Personal history of radiation therapy 2016   BREAST CA - MAMMOSITE- Right   Screening for obesity    Seasonal allergies    Special screening for malignant neoplasms, colon    Unspecified essential  hypertension    PAST SURGICAL HISTORY :   Past Surgical History:  Procedure Laterality Date   BREAST BIOPSY Right 2016   Invasive   BREAST CYST ASPIRATION Left 2010   BREAST LUMPECTOMY Left 2012   BREAST CA   BREAST LUMPECTOMY Right 2016   INVASIVE MAMMARY CARCINOMA   BREAST LUMPECTOMY WITH AXILLARY LYMPH NODE BIOPSY Right 08/01/2014   Procedure: BREAST LUMPECTOMY WITH AXILLARY LYMPH NODE BIOPSY;  Surgeon: Reyes LELON Cota, MD;  Location: [facility] ORS;  Service: General;  Laterality: Right;   BREAST MAMMOSITE  April 2012   mammosite placement and removal    BREAST SURGERY Left 2012   left breast wide excision, sentinel node, MammoSite   BREAST SURGERY Right 08/06/2014   Wide excision/sentinel node biopsy, T1a, N0 triple negative, MammoSite   CESAREAN SECTION  1988   COLONOSCOPY  October 17, 2009   Dr. Christ, normal study.   KIDNEY STONE SURGERY  2003   stones removed by Dr. Toribio- stent placed/removed   PORTACATH PLACEMENT Left 11/09/2014   Procedure: INSERTION PORT-A-CATH;  Surgeon: Reyes LELON Cota, MD;  Location: [facility] ORS;  Service: General;  Laterality: Left;   SENTINEL NODE BIOPSY Right 08/01/2014   Procedure: SENTINEL NODE BIOPSY;  Surgeon: Reyes LELON Cota, MD;  Location: [facility] ORS;  Service: General;  Laterality: Right;   TUBAL LIGATION     FAMILY HISTORY :   Family History  Problem Relation Age of Onset   Ovarian cancer Mother    Diabetes Mother    Arthritis Mother    Diabetes Father    Prostate cancer Father        dx in his 1s   Alcohol abuse Father    Hypertension Brother    Cancer Maternal Grandmother        bone   Cancer Maternal Grandfather        lung   Cancer Paternal Grandmother        lung   Asthma Paternal Grandfather    Hypertension Sister    Prostate cancer Brother 25   AAA (abdominal aortic aneurysm) Maternal Uncle    Breast cancer Other    SOCIAL HISTORY:   Social History   Tobacco Use   Smoking status: Never   Smokeless tobacco: Never   Vaping Use   Vaping status: Never Used  Substance Use Topics   Alcohol use: Yes    Comment: occasionally   Drug use: No   ALLERGIES:  is allergic to cayenne pepper [cayenne], shellfish allergy, and tape.  MEDICATIONS:  Current Outpatient Medications  Medication Sig Dispense Refill   albuterol  (VENTOLIN  HFA) 108 (90 Base) MCG/ACT inhaler Inhale 2 puffs into the lungs every 6 (six) hours as needed for wheezing or shortness of breath. 26.8 g 4   aspirin 81 MG tablet Take 81 mg by mouth daily.     atorvastatin  (LIPITOR) 20 MG tablet Take 1 tablet (20 mg total) by mouth daily. 90 tablet 3   bisacodyl (DULCOLAX) 5 MG EC tablet Take 5 mg by mouth daily as needed for moderate constipation.     Cholecalciferol (VITAMIN D3) 1000 UNITS CAPS Take 1 capsule by mouth daily.      clobetasol  (TEMOVATE ) 0.05 % external solution Apply 1 application topically daily. Mix bottle in 1 tub of cerave cream and use qd to trunk, arms and legs, avoid face, groin, axilla 50 mL 1   DUPIXENT  300 MG/2ML SOAJ INJECT 1 PEN SUBCUTANEOUSLY  EVERY OTHER WEEK 4 mL 1   esomeprazole (NEXIUM) 40 MG capsule Take 40 mg by mouth 2 (two) times daily as needed (acid reflux).      lisinopril  (ZESTRIL ) 10 MG tablet Take 1 tablet (10 mg total) by mouth daily. 90 tablet 4   loratadine (CLARITIN) 10 MG tablet Take 10 mg by mouth daily as needed. Reported on 04/13/2015     polyethylene glycol (MIRALAX / GLYCOLAX) packet Take 17 g by mouth every other day.     potassium chloride  SA (KLOR-CON  M) 20 MEQ tablet TAKE 1 TABLET BY MOUTH EVERY DAY 30 tablet 4   tacrolimus  (PROTOPIC ) 0.1 % ointment Spot treat affected areas on skin once or twice a day 100 g 1   No current facility-administered medications for this visit.   Facility-Administered Medications Ordered in Other Visits  Medication Dose Route Frequency Provider Last Rate Last Admin   sodium chloride  0.9 % injection 10 mL  10 mL Intravenous PRN Marina Cruise, MD   10 mL at 11/10/14  1138   PHYSICAL EXAMINATION: ECOG PERFORMANCE STATUS: 1 - Symptomatic but completely ambulatory  BP (!) 112/59 (BP Location: Left Arm, Patient Position: Sitting)  Pulse 78   Temp (!) 96.6 F (35.9 C) (Tympanic)   Resp 18   Ht 5' 5 (1.651 m)   Wt 208 lb 1.6 oz (94.4 kg)   LMP  (LMP Unknown)   SpO2 100%   BMI 34.63 kg/m   Filed Weights   01/29/24 0933  Weight: 208 lb 1.6 oz (94.4 kg)    Physical Exam HENT:     Head: Normocephalic and atraumatic.     Mouth/Throat:     Pharynx: No oropharyngeal exudate.  Eyes:     Pupils: Pupils are equal, round, and reactive to light.  Cardiovascular:     Rate and Rhythm: Normal rate and regular rhythm.  Pulmonary:     Effort: No respiratory distress.     Breath sounds: No wheezing.  Abdominal:     General: There is no distension.     Palpations: Abdomen is soft.     Tenderness: There is no abdominal tenderness. There is no guarding.  Musculoskeletal:        General: No tenderness. Normal range of motion.  Skin:    General: Skin is warm.  Neurological:     Mental Status: She is alert and oriented to person, place, and time.  Psychiatric:        Mood and Affect: Mood and affect normal.        Behavior: Behavior normal.    LABORATORY DATA:  I have reviewed the data as listed    Component Value Date/Time   NA 135 01/15/2024 0917   NA 139 12/25/2023 0819   NA 140 07/16/2011 2130   K 3.5 01/15/2024 0917   K 3.7 07/16/2011 2130   CL 104 01/15/2024 0917   CL 106 07/16/2011 2130   CO2 23 01/15/2024 0917   CO2 27 07/16/2011 2130   GLUCOSE 96 01/15/2024 0917   GLUCOSE 93 07/16/2011 2130   BUN 19 01/15/2024 0917   BUN 23 12/25/2023 0819   BUN 16 07/16/2011 2130   CREATININE 1.02 (H) 01/15/2024 0917   CREATININE 0.95 07/16/2011 2130   CALCIUM  9.1 01/15/2024 0917   CALCIUM  9.0 07/16/2011 2130   PROT 8.1 01/15/2024 0917   PROT 7.1 11/20/2023 1012   ALBUMIN 3.8 01/15/2024 0917   ALBUMIN 4.1 11/20/2023 1012   AST 19  01/15/2024 0917   ALT 19 01/15/2024 0917   ALKPHOS 95 01/15/2024 0917   BILITOT 0.7 01/15/2024 0917   GFRNONAA >60 01/15/2024 0917   GFRNONAA >60 07/16/2011 2130   GFRAA 80 04/16/2020 1159   GFRAA >60 07/16/2011 2130   Lab Results  Component Value Date   WBC 7.0 01/15/2024   NEUTROABS 3.9 01/15/2024   HGB 9.6 (L) 01/15/2024   HCT 28.7 (L) 01/15/2024   MCV 96.6 01/15/2024   PLT 209 01/15/2024      Chemistry      Component Value Date/Time   NA 135 01/15/2024 0917   NA 139 12/25/2023 0819   NA 140 07/16/2011 2130   K 3.5 01/15/2024 0917   K 3.7 07/16/2011 2130   CL 104 01/15/2024 0917   CL 106 07/16/2011 2130   CO2 23 01/15/2024 0917   CO2 27 07/16/2011 2130   BUN 19 01/15/2024 0917   BUN 23 12/25/2023 0819   BUN 16 07/16/2011 2130   CREATININE 1.02 (H) 01/15/2024 0917   CREATININE 0.95 07/16/2011 2130      Component Value Date/Time   CALCIUM  9.1 01/15/2024 0917   CALCIUM  9.0 07/16/2011 2130  ALKPHOS 95 01/15/2024 0917   AST 19 01/15/2024 0917   ALT 19 01/15/2024 0917   BILITOT 0.7 01/15/2024 0917     06/24/23- MM 3D Diagnostic mammogram bilateral & ultrasound EXAM: DIGITAL DIAGNOSTIC BILATERAL MAMMOGRAM WITH TOMOSYNTHESIS AND CAD; ULTRASOUND RIGHT BREAST LIMITED; ULTRASOUND LEFT BREAST LIMITED   TECHNIQUE: Bilateral digital diagnostic mammography and breast tomosynthesis was performed. The images were evaluated with computer-aided detection. ; Targeted ultrasound examination of the right breast was performed; Targeted ultrasound examination of the left breast was performed.   COMPARISON:  Previous exam(s).   ACR Breast Density Category b: There are scattered areas of fibroglandular density.   FINDINGS: Diagnostic images of the RIGHT breast demonstrate 2 adjacent oval circumscribed masses in the RIGHT outer breast at posterior depth. There is density and architectural distortion within the RIGHT breast, consistent with postsurgical changes. These are  stable in comparison to prior.   Diagnostic images of the LEFT breast demonstrate decreased conspicuity of previously described mass in the lower inner breast at anterior to middle depth compared to prior. Similar appearance of a mass in the LEFT upper outer breast at posterior depth. There is density and architectural distortion within the LEFT breast, consistent with postsurgical changes. These are stable in comparison to prior. No new suspicious findings are noted.   On physical exam, no suspicious mass is appreciated in the RIGHT outer breast.   Targeted ultrasound was performed of the RIGHT breast. At 10 o'clock 6 cm from the nipple, there is an oval circumscribed anechoic mass with posterior acoustic enhancement and a thin internal septation. It measures 7 x 5 by 5 mm and is consistent with a benign cyst. At 10 o'clock 7 cm from the nipple, there is an oval circumscribed anechoic mass with posterior acoustic enhancement and a thin internal septation. It measures 7 x 4 x 6 mm and is consistent with a benign cyst.   Targeted ultrasound was performed of the LEFT breast. At 1 o'clock 6 cm from the nipple, there is revisualization of an oval circumscribed near anechoic mass with posterior acoustic enhancement. It measures 3 x 4 x 3 mm, previously 3 x 2 x 5 mm in 2024. This is not significantly changed in comparison to prior given differences in scan plane technique.   Targeted ultrasound was performed LEFT lower inner breast. At 8 o'clock 5 cm from the nipple there is decreased conspicuity of previously described conglomeration of cystic appearing masses. Residual trace cystic appearing changes are noted spanning 3 x 2 x 7 mm, previously 10 by 5 x 4 mm.   IMPRESSION: 1. Stable probably benign LEFT breast mass at 1 o'clock. Recommend follow-up diagnostic mammogram and ultrasound in 1 year. This will establish 2 years of definitive stability. 2. Decrease in size of a LEFT  breast mass at 8 o'clock consistent with benign cystic etiology. 3. Benign cysts are noted in the RIGHT breast. No mammographic evidence of malignancy in the RIGHT breast.   ASSESSMENT & PLAN:   MGUS (monoclonal gammopathy of unknown significance) # [7981- 1gm/dl]MGUS- NOV 7975--F protein- 0.8; Kappa/lamda light chain= 2.8 [K=34].  Nov 2025- M protein- 1.2, Kappa/lambda light chain -2.47 (K-39.8). Stable except for mild anemia. No evidence of renal dysfunction or hypercalcemia.     # Chronic mild anemia-unclear etiology [BL  9-10]. Jan 2024 ferritin 103, iron studies 13%. Folate and b12 are normal. On gentle iron. Today Hb 9.6. Overall stable. Continue to monitor every 6 months   # Chronic mild hypokalemia  [  left leg cramps]-today potassium is 3.5. Continue supplementation as per PCP- stable   # 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  Clinically no evidence of recurrence; see below re: mammograms. She reports doing self exams regularly.  CA 27.29 at 21.6 was 11.8 May 26 repeat in 3 mths monitor trends    # OCT 2024- bil Mammo- [CC] Multiple probably benign masses within the left breast, favored to represent complicated cysts. Given the imaging appearance on mammography as well, a follow-up left breast diagnostic mammogram and ultrasound is recommended in April 2026  # April 2025- Mammo bil (CC)- stable likely benign left breast mass at 1:00. Decrease in left breast mass at 8:00. Benign cysts in right breast. Repeat bil diagnostic mammo with ultrasounds in April 2026.    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 continue VitD. [hx of falls]; recommend resistance training [pt currently on @ home/videos] - check BMD in oct 2026  stable   # Obesity- recommend continued weight loss efforts  # BIL Diagnostic mammogram in April 2026  # Port-a-cath: placed 11/2014 by Dr Adrienne. Removed 2017.     # DISPOSITION:  # 3 months lab only ca 27-29 # 6 months-2 weeks prior- labs (cbc, cmp, b12, ferritin, iron studies, folic acid , LDH, ca 27-29, MM panel, K-L panel, Vit D), Dr Adrienne- LP  No problem-specific Assessment & Plan notes found for this encounter.   Adrienne Husband, NP 01/29/2024

## 2024-01-29 ENCOUNTER — Encounter: Payer: Self-pay | Admitting: Nurse Practitioner

## 2024-01-29 ENCOUNTER — Inpatient Hospital Stay: Admitting: Nurse Practitioner

## 2024-01-29 ENCOUNTER — Ambulatory Visit: Admitting: Internal Medicine

## 2024-01-29 ENCOUNTER — Other Ambulatory Visit: Payer: Self-pay | Admitting: *Deleted

## 2024-01-29 VITALS — BP 112/59 | HR 78 | Temp 96.6°F | Resp 18 | Ht 65.0 in | Wt 208.1 lb

## 2024-01-29 DIAGNOSIS — D472 Monoclonal gammopathy: Secondary | ICD-10-CM

## 2024-01-29 DIAGNOSIS — Z853 Personal history of malignant neoplasm of breast: Secondary | ICD-10-CM

## 2024-02-06 ENCOUNTER — Other Ambulatory Visit: Payer: Self-pay | Admitting: Nurse Practitioner

## 2024-02-06 DIAGNOSIS — I10 Essential (primary) hypertension: Secondary | ICD-10-CM

## 2024-02-10 NOTE — Telephone Encounter (Signed)
 Requested medication (s) are due for refill today: expired medication- albuterol   Requested medication (s) are on the active medication list: yes   Last refill:  albuterol - 11/04/22 #26.8 g 4 refills, lisinopril - 11/20/23 #90 4 refills  Future visit scheduled: yes 05/20/24  Notes to clinic:   expired medication - albuterol , do you want to renew Rx, protocol failed 01/29/24 BP 112/59, do you want to refill lisinopril ?     Requested Prescriptions  Pending Prescriptions Disp Refills   albuterol  (VENTOLIN  HFA) 108 (90 Base) MCG/ACT inhaler [Pharmacy Med Name: ALBUTEROL  HFA 90MCG/ACT (PV)] 6.7 g 13    Sig: USE 2 INHALATIONS BY MOUTH EVERY 6 HOURS AS NEEDED     Pulmonology:  Beta Agonists 2 Passed - 02/10/2024  1:08 PM      Passed - Last BP in normal range    BP Readings from Last 1 Encounters:  01/29/24 (!) 112/59         Passed - Last Heart Rate in normal range    Pulse Readings from Last 1 Encounters:  01/29/24 78         Passed - Valid encounter within last 12 months    Recent Outpatient Visits           2 months ago Peripheral neuropathy due to chemotherapy   [place] [facility] [place], Melanie T, NP   9 months ago Peripheral neuropathy due to chemotherapy   [place] [facility] [place], Jolene T, NP               lisinopril  (ZESTRIL ) 10 MG tablet [Pharmacy Med Name: Lisinopril  10 MG Oral Tablet] 90 tablet 3    Sig: TAKE 1 TABLET BY MOUTH DAILY     Cardiovascular:  ACE Inhibitors Failed - 02/10/2024  1:08 PM      Failed - Cr in normal range and within 180 days    Creatinine  Date Value Ref Range Status  01/15/2024 1.02 (H) 0.44 - 1.00 mg/dL Final  94/91/7986 9.04 0.60 - 1.30 mg/dL Final         Passed - K in normal range and within 180 days    Potassium  Date Value Ref Range Status  01/15/2024 3.5 3.5 - 5.1 mmol/L Final  07/16/2011 3.7 3.5 - 5.1 mmol/L Final         Passed - Patient is not pregnant      Passed - Last BP in  normal range    BP Readings from Last 1 Encounters:  01/29/24 (!) 112/59         Passed - Valid encounter within last 6 months    Recent Outpatient Visits           2 months ago Peripheral neuropathy due to chemotherapy   [place] [facility] [place], Melanie T, NP   9 months ago Peripheral neuropathy due to chemotherapy   [place] [facility] Valerio [name], NP

## 2024-03-25 ENCOUNTER — Encounter: Payer: Self-pay | Admitting: Nurse Practitioner

## 2024-04-29 ENCOUNTER — Inpatient Hospital Stay

## 2024-05-20 ENCOUNTER — Ambulatory Visit: Admitting: Nurse Practitioner

## 2024-05-25 ENCOUNTER — Ambulatory Visit: Admitting: Nurse Practitioner

## 2024-06-17 ENCOUNTER — Ambulatory Visit: Admitting: Nurse Practitioner

## 2024-07-29 ENCOUNTER — Inpatient Hospital Stay: Admitting: Internal Medicine

## 2024-07-29 ENCOUNTER — Inpatient Hospital Stay
